# Patient Record
Sex: Male | Born: 1960 | Race: Black or African American | Hispanic: No | Marital: Married | Smoking: Former smoker
Health system: Southern US, Community
[De-identification: ages and names within clinical notes are randomized; demographics above are authoritative.]

## PROBLEM LIST (undated history)

## (undated) DIAGNOSIS — K219 Gastro-esophageal reflux disease without esophagitis: Secondary | ICD-10-CM

## (undated) DIAGNOSIS — I5022 Chronic systolic (congestive) heart failure: Secondary | ICD-10-CM

## (undated) DIAGNOSIS — E875 Hyperkalemia: Secondary | ICD-10-CM

## (undated) DIAGNOSIS — I2510 Atherosclerotic heart disease of native coronary artery without angina pectoris: Secondary | ICD-10-CM

## (undated) DIAGNOSIS — M25569 Pain in unspecified knee: Secondary | ICD-10-CM

## (undated) DIAGNOSIS — J449 Chronic obstructive pulmonary disease, unspecified: Secondary | ICD-10-CM

## (undated) DIAGNOSIS — N182 Chronic kidney disease, stage 2 (mild): Secondary | ICD-10-CM

## (undated) DIAGNOSIS — E781 Pure hyperglyceridemia: Secondary | ICD-10-CM

## (undated) DIAGNOSIS — N183 Chronic kidney disease, stage 3 unspecified: Secondary | ICD-10-CM

## (undated) DIAGNOSIS — Z8701 Personal history of pneumonia (recurrent): Secondary | ICD-10-CM

## (undated) DIAGNOSIS — Z9289 Personal history of other medical treatment: Secondary | ICD-10-CM

## (undated) DIAGNOSIS — I509 Heart failure, unspecified: Secondary | ICD-10-CM

## (undated) DIAGNOSIS — I428 Other cardiomyopathies: Secondary | ICD-10-CM

## (undated) DIAGNOSIS — E785 Hyperlipidemia, unspecified: Secondary | ICD-10-CM

## (undated) DIAGNOSIS — I10 Essential (primary) hypertension: Secondary | ICD-10-CM

## (undated) HISTORY — PX: OTHER SURGICAL HISTORY: SHX169

## (undated) HISTORY — DX: Chronic systolic (congestive) heart failure: I50.22

## (undated) HISTORY — DX: Heart failure, unspecified: I50.9

## (undated) HISTORY — DX: Chronic obstructive pulmonary disease, unspecified: J44.9

## (undated) HISTORY — DX: Pain in unspecified knee: M25.569

## (undated) HISTORY — DX: Chronic kidney disease, stage 3 unspecified: N18.30

## (undated) HISTORY — DX: Personal history of other medical treatment: Z92.89

## (undated) HISTORY — DX: Atherosclerotic heart disease of native coronary artery without angina pectoris: I25.10

## (undated) HISTORY — DX: Personal history of pneumonia (recurrent): Z87.01

## (undated) HISTORY — DX: Hyperlipidemia, unspecified: E78.5

## (undated) HISTORY — DX: Other cardiomyopathies: I42.8

## (undated) HISTORY — DX: Essential (primary) hypertension: I10

## (undated) HISTORY — DX: Gastro-esophageal reflux disease without esophagitis: K21.9

---

## 2000-08-25 ENCOUNTER — Encounter: Admission: RE | Admit: 2000-08-25 | Discharge: 2000-08-25 | Payer: Self-pay | Admitting: Occupational Medicine

## 2000-08-25 ENCOUNTER — Encounter: Payer: Self-pay | Admitting: Occupational Medicine

## 2000-12-05 ENCOUNTER — Encounter: Admission: RE | Admit: 2000-12-05 | Discharge: 2001-03-05 | Payer: Self-pay

## 2011-02-16 HISTORY — PX: CARDIAC CATHETERIZATION: SHX172

## 2011-03-08 ENCOUNTER — Encounter: Payer: Self-pay | Admitting: Cardiology

## 2011-03-14 ENCOUNTER — Encounter: Payer: Self-pay | Admitting: Cardiology

## 2011-03-18 ENCOUNTER — Encounter: Payer: Self-pay | Admitting: Cardiology

## 2011-04-05 ENCOUNTER — Encounter: Payer: Self-pay | Admitting: Cardiology

## 2011-04-23 ENCOUNTER — Encounter: Payer: Self-pay | Admitting: Cardiology

## 2011-04-23 ENCOUNTER — Ambulatory Visit (INDEPENDENT_AMBULATORY_CARE_PROVIDER_SITE_OTHER): Payer: BC Managed Care – PPO | Admitting: Cardiology

## 2011-04-23 DIAGNOSIS — R0602 Shortness of breath: Secondary | ICD-10-CM

## 2011-04-23 DIAGNOSIS — E785 Hyperlipidemia, unspecified: Secondary | ICD-10-CM

## 2011-04-23 DIAGNOSIS — I10 Essential (primary) hypertension: Secondary | ICD-10-CM | POA: Insufficient documentation

## 2011-04-23 DIAGNOSIS — I509 Heart failure, unspecified: Secondary | ICD-10-CM

## 2011-04-23 MED ORDER — SPIRONOLACTONE 25 MG PO TABS: 25.0000 mg | ORAL_TABLET | Freq: Every day | ORAL | Status: DC

## 2011-04-23 NOTE — Assessment & Plan Note (Signed)
We will determine targets based on the presence or absence of vascular disease going forward.

## 2011-04-23 NOTE — Patient Instructions (Addendum)
Please start Spironolactone 25 mg once a day. Continue all other medications as listed.  Please have lab work in 1 week. (BMP)  Your physician has requested that you have an echocardiogram. Echocardiography is a painless test that uses sound waves to create images of your heart. It provides your doctor with information about the size and shape of your heart and how well your heart's chambers and valves are working. This procedure takes approximately one hour. There are no restrictions for this procedure.  Follow up with Dr Percival Spanish in approximately 2 weeks.  Heart Failure Heart failure (HF) is a condition in which the heart has trouble pumping blood. This means your heart does not pump blood efficiently for your body to work well. In some cases of HF, fluid may back up into your lungs or you may have swelling (edema) in your lower legs. HF is a long-term (chronic) condition. It is important for you to take good care of yourself and follow your caregiver's treatment plan. CAUSES   Health conditions:   High blood pressure (hypertension) causes the heart muscle to work harder than normal. When pressure in the blood vessels is high, the heart needs to pump (contract) with more force in order to circulate blood throughout the body. High blood pressure eventually causes the heart to become stiff and weak.   Coronary artery disease (CAD) is the buildup of cholesterol and fat (plaques) in the arteries of the heart. The blockage in the arteries deprives the heart muscle of oxygen and blood. This can cause chest pain and may lead to a heart attack. High blood pressure can also contribute to CAD.   Heart attack (myocardial infarction) occurs when 1 or more arteries in the heart become blocked. The loss of oxygen damages the muscle tissue of the heart. When this happens, part of the heart muscle dies. The injured tissue does not contract as well and weakens the heart's ability to pump blood.   Abnormal  heart valves can cause HF when the heart valves do not open and close properly. This makes the heart muscle pump harder to keep the blood flowing.   Heart muscle disease (cardiomyopathy or myocarditis) is damage to the heart muscle from a variety of causes. These can include drug or alcohol abuse, infections, or unknown reasons. These can increase the risk of HF.   Lung disease makes the heart work harder because the lungs do not work properly. This can cause a strain on the heart leading it to fail.   Diabetes increases the risk of HF. High blood sugar contributes to high fat (lipid) levels in the blood. Diabetes can also cause slow damage to tiny blood vessels that carry important nutrients to the heart muscle. When the heart does not get enough oxygen and food, it can cause the heart to become weak and stiff. This leads to a heart that does not contract efficiently.   Other diseases can contribute to HF. These include abnormal heart rhythms, thyroid problems, and low blood counts (anemia).   Unhealthy lifestyle habits:   Obesity.   Smoking.   Eating foods high in fat and cholesterol.   Eating or drinking beverages high in salt.   Drug or alcohol abuse.   Lack of exercise.  SYMPTOMS  HF symptoms may vary and can be hard to detect. Symptoms may include:  Shortness of breath with activity, such as climbing stairs.   Persistent cough.   Swelling of the feet, ankles, legs, or abdomen.  Unexplained weight gain.   Difficulty breathing when lying flat.   Waking from sleep because of the need to sit up and get more air.   Rapid heartbeat.   Fatigue and loss of energy.   Feeling lightheaded or close to fainting.  DIAGNOSIS  A diagnosis of HF is based on your history, symptoms, physical examination, and diagnostic tests. Diagnostic tests for HF may include:  EKG.   Chest X-ray.   Blood tests.   Exercise stress test.   Blood oxygen test (arterial blood gas).    Evaluation by a heart doctor (cardiologist).   Ultrasound evaluation of the heart (echocardiogram).   Heart artery test to look for blockages (angiogram).   Radioactive imaging to look at the heart (radionuclide test).  TREATMENT  Treatment is aimed at managing the symptoms of HF. Medicines, lifestyle changes, or surgical intervention may be necessary to treat HF.  Medicines to help treat HF may include:   Angiotensin-converting enzyme (ACE) inhibitors. These block the effects of a blood protein called angiotensin-converting enzyme. ACE inhibitors relax (dilate) the blood vessels and help lower blood pressure. This decreases the workload of the heart, slows the progression of HF, and improves symptoms.   Angiotensin receptor blockers (ARBs). These medications work similar to ACE inhibitors. ARBs may be an alternative for people who cannot tolerate an ACE inhibitor.   Aldosterone antagonists. This medication helps get rid of extra fluid from your body. This lowers the volume of blood the heart has to pump.   Water pills (diuretics). Diuretics cause the kidneys to remove salt and water from the blood. The extra fluid is removed by urination. By removing extra fluid from the body, diuretics help lower the workload of the heart and help prevent fluid buildup in the lungs so breathing is easier.   Beta blockers. These prevent the heart from beating too fast and improve heart muscle strength. Beta blockers help maintain a normal heart rate, control blood pressure, and improve HF symptoms.   Digitalis. This increases the force of the heartbeat and may be helpful to people with HF or heart rhythm problems.   Healthy lifestyle changes include:   Stopping smoking.   Eating a healthy diet. Avoid foods high in fat. Avoid foods fried in oil or made with fat. A dietician can help with healthy food choices.   Limiting how much salt you eat.   Limiting alcohol intake to no more than 1 drink per  day for women and 2 drinks per day for men. Drinking more than that is harmful to your heart. If your heart has already been damaged by alcohol or you have severe HF, drinking alcohol should be stopped completely.   Exercising as directed by your caregiver.   Surgical treatment for HF may include:   Procedures to open blocked arteries, repair damaged heart valves, or remove damaged heart muscle tissue.   A pacemaker to help heart muscle function and to control certain abnormal heart rhythms.   A defibrillator to possibly prevent sudden cardiac death.  HOME CARE INSTRUCTIONS   Activity level. Your caregiver can help you determine what type of exercise program may be helpful. It is important to maintain your strength. Pace your physical activity to avoid shortness of breath or chest pain. Rest for 1 hour before and after meals. A cardiac rehabilitation program may be helpful to some people with HF.   Diet. Eat a heart healthy diet. Food choices should be low in saturated fat and cholesterol. Talk  to a dietician to learn about heart healthy foods.   Salt intake. When you have HF, you need to limit the amount of salt you eat. Eat less than 1500 milligrams (mg) of salt per day or as recommended by your caregiver.   Weight monitoring. Weigh yourself every day. You should weigh yourself in the morning after you urinate and before you eat breakfast. Wear the same amount of clothing each time you weigh yourself. Record your weight daily. Bring your recorded weights to your clinic visits. Tell your caregiver right away if you have gained 3 lb/1.4 kg in 1 day, or 5 lb/2.3 kg in a week or whatever amount you were told to report.   Blood pressure monitoring. This should be done as directed by your caregiver. A home blood pressure cuff can be purchased at a drugstore. Record your blood pressure numbers and bring them to your clinic visits. Tell your caregiver if you become dizzy or lightheaded upon standing  up.   Smoking. If you are currently a smoker, it is time to quit. Nicotine makes your heart work harder by causing your blood vessels to constrict. Do not use nicotine gum or patches before talking to your caregiver.   Follow up. Be sure to schedule a follow-up visit with your caregiver. Keep all your appointments.  SEEK MEDICAL CARE IF:   Your weight increases by 3 lb/1.4 kg in 1 day or 5 lb/2.3 kg in a week.   You notice increasing shortness of breath that is unusual for you. This may happen during rest, sleep, or with activity.   You cough more than normal, especially with physical activity.   You notice more swelling in your hands, feet, ankles, or belly (abdomen).   You are unable to sleep because it is hard to breathe.   You cough up bloody mucus (sputum).   You begin to feel "jumping" or "fluttering" sensations (palpitations) in your chest.  SEEK IMMEDIATE MEDICAL CARE IF:   You have severe chest pain or pressure which may include symptoms such as:   Pain or pressure in the arms, neck, jaw, or back.   Feeling sweaty.   Feeling sick to your stomach (nauseous).   Feeling short of breath while at rest.   Having a fast or irregular heartbeat.   You experience stroke symptoms. These symptoms include:   Facial weakness or numbness.   Weakness or numbness in an arm, leg, or on one side of your body.   Blurred vision.   Difficulty talking or thinking.   Dizziness or fainting.   Severe headache.  THESE ARE MEDICAL EMERGENCIES. Do not wait to see if the symptoms go away. Call your local emergency services (911 in [place] DO NOT drive yourself to the hospital. IMPORTANT  Make a list of every medicine, vitamin, or herbal supplement you are taking. Keep the list with you at all times. Show it to your caregiver at every visit. Keep the list up-to-date.   Ask your caregiver or pharmacist to write an explanation of each medicine you are taking. This should include:   Why  you are taking it.   The possible side effects.   The best time of day to take it.   Foods to take with it or what foods to avoid.   When to stop taking it.  MAKE SURE YOU:   Understand these instructions.   Will watch your condition.   Will get help right away if you are not doing well  or get worse.  Document Released: 02/01/2005 Document Revised: 01/21/2011 Document Reviewed: 05/16/2009 [facility] Patient Information 2012 [place].

## 2011-04-23 NOTE — Assessment & Plan Note (Signed)
I will add spironolactone. He will get a basic metabolic profile in one week. He will continue the other meds as listed.

## 2011-04-23 NOTE — Assessment & Plan Note (Signed)
The patient has evidence of heart failure. His exam suggests left ventricular hypertrophy. He at least has diastolic dysfunction. There may be systolic dysfunction I will follow this with an echocardiogram. I will also eventually do an ischemia workup given his risk factors but will decide on how to evaluate this based on the results of the echocardiogram. For now we discussed at length salt and fluid restriction which he will begin. Also manage his blood pressure as below.

## 2011-04-23 NOTE — Progress Notes (Signed)
HPI The patient is a pleasant gentleman without past cardiac history. For the last 2 months he's been having increasing dyspnea. This progressed to the point of having shortness of breath walking 5 yards on level ground. He's also been describing classic PND. He's not been having any weight gain or edema. He's had a mild cough productive of occasional white or yellow sputum. He's not been having any chest discomfort, neck or arm discomfort. He doesn't report palpitations, presyncope or syncope. He was seeing his primary provider and initially was treated for possible URI. However, quickly realized by chest x-ray that he had some suggestion of pulmonary edema. BNP levels have been elevated. He has been treated with diuretics and adjustments of medications for control of his blood pressure. He does think this is helping. He is less short of breath now though still not where he was this time last year.  Of note he has had high blood pressure not well controlled. More recently he has been diagnosed with diabetes.  No Known Allergies  Current Outpatient Prescriptions  Medication Sig Dispense Refill  . furosemide (LASIX) 20 MG tablet Take 20 mg by mouth 2 (two) times daily.      Marland Kitchen glipiZIDE (GLUCOTROL) 10 MG tablet Take 10 mg by mouth 2 (two) times daily before a meal.      . lisinopril (PRINIVIL,ZESTRIL) 40 MG tablet Take 40 mg by mouth daily.      . metFORMIN (GLUCOPHAGE) 1000 MG tablet Take 1,000 mg by mouth 2 (two) times daily with a meal.      . metoprolol succinate (TOPROL-XL) 50 MG 24 hr tablet Take 50 mg by mouth daily. Patient takes 1 and 1/2 tab daily, Take with or immediately following a meal.      . spironolactone (ALDACTONE) 25 MG tablet Take 1 tablet (25 mg total) by mouth daily.  30 tablet  11    Past Medical History  Diagnosis Date  . Diabetes mellitus   . COPD (chronic obstructive pulmonary disease)   . Hypertension   . Hyperlipidemia   . GERD (gastroesophageal reflux disease)     . Knee pain   . SOB (shortness of breath)   . Atelectasis     mild bibasilar  . Pneumonia     Past Surgical History  Procedure Date  . None     Family History  Problem Relation Age of Onset  . Diabetes Sister     History   Social History  . Marital Status: Married    Spouse Name: N/A    Number of Children: 6  . Years of Education: N/A   Occupational History  . [employer]    Social History Main Topics  . Smoking status: Former Smoker    Quit date: 04/23/2006  . Smokeless tobacco: Not on file  . Alcohol Use: Not on file  . Drug Use: Not on file  . Sexually Active: Not on file   Other Topics Concern  . Not on file   Social History Narrative   Lives with wife.    ROS:  Positive for dentures, reflux, knee pain. Otherwise as stated in the history of present illness and negative for other systems.  PHYSICAL EXAM BP 159/91  Pulse 65  Ht 5\' 11"  (1.803 m)  Wt 206 lb (93.441 kg)  BMI 28.73 kg/m2 GENERAL:  Well appearing HEENT:  Pupils equal round and reactive, fundi not visualized, oral mucosa unremarkable NECK:  No jugular venous distention, waveform within normal  limits, carotid upstroke brisk and symmetric, no bruits, no thyromegaly LYMPHATICS:  No cervical, inguinal adenopathy LUNGS:  Clear to auscultation bilaterally BACK:  No CVA tenderness CHEST:  Unremarkable HEART:  PMI displaced laterally, S1 and S2 within normal limits, positive S3 vs fixed split S2, no S4, no clicks, no rubs, no murmurs ABD:  Flat, positive bowel sounds normal in frequency in pitch, no bruits, no rebound, no guarding, no midline pulsatile mass, no hepatomegaly, no splenomegaly EXT:  2 plus pulses throughout, no edema, no cyanosis no clubbing SKIN:  No rashes no nodules NEURO:  Cranial nerves II through XII grossly intact, motor grossly intact throughout PSYCH:  Cognitively intact, oriented to person place and time  EKG:  Sinus rhythm, rate 65, left ventricle hypertrophy by voltage  criteria, QTC prolonged, nonspecific inferolateral T wave flattening. 04/23/2011   ASSESSMENT AND PLAN

## 2011-04-27 DIAGNOSIS — I509 Heart failure, unspecified: Secondary | ICD-10-CM | POA: Insufficient documentation

## 2011-04-28 ENCOUNTER — Other Ambulatory Visit (INDEPENDENT_AMBULATORY_CARE_PROVIDER_SITE_OTHER): Payer: BC Managed Care – PPO

## 2011-04-28 DIAGNOSIS — R0602 Shortness of breath: Secondary | ICD-10-CM

## 2011-04-28 DIAGNOSIS — I509 Heart failure, unspecified: Secondary | ICD-10-CM

## 2011-04-28 LAB — BASIC METABOLIC PANEL
CO2: 28 mEq/L (ref 19–32)
Calcium: 9 mg/dL (ref 8.4–10.5)
GFR: 57.37 mL/min — ABNORMAL LOW (ref >60.00)
Potassium: 3.5 mEq/L (ref 3.5–5.1)
Sodium: 140 mEq/L (ref 135–145)

## 2011-04-29 ENCOUNTER — Encounter (HOSPITAL_COMMUNITY): Payer: Self-pay | Admitting: *Deleted

## 2011-04-29 ENCOUNTER — Other Ambulatory Visit: Payer: Self-pay

## 2011-04-29 ENCOUNTER — Emergency Department (HOSPITAL_COMMUNITY): Payer: BC Managed Care – PPO

## 2011-04-29 ENCOUNTER — Emergency Department (HOSPITAL_COMMUNITY)
Admission: EM | Admit: 2011-04-29 | Discharge: 2011-04-30 | Disposition: A | Discharge status: Home / Self Care | Payer: BC Managed Care – PPO | Attending: Emergency Medicine | Admitting: Emergency Medicine

## 2011-04-29 DIAGNOSIS — J449 Chronic obstructive pulmonary disease, unspecified: Secondary | ICD-10-CM | POA: Insufficient documentation

## 2011-04-29 DIAGNOSIS — E119 Type 2 diabetes mellitus without complications: Secondary | ICD-10-CM | POA: Insufficient documentation

## 2011-04-29 DIAGNOSIS — G4730 Sleep apnea, unspecified: Secondary | ICD-10-CM | POA: Insufficient documentation

## 2011-04-29 DIAGNOSIS — R0602 Shortness of breath: Secondary | ICD-10-CM | POA: Insufficient documentation

## 2011-04-29 DIAGNOSIS — E785 Hyperlipidemia, unspecified: Secondary | ICD-10-CM | POA: Insufficient documentation

## 2011-04-29 DIAGNOSIS — I10 Essential (primary) hypertension: Secondary | ICD-10-CM | POA: Insufficient documentation

## 2011-04-29 DIAGNOSIS — J4489 Other specified chronic obstructive pulmonary disease: Secondary | ICD-10-CM | POA: Insufficient documentation

## 2011-04-29 DIAGNOSIS — Z8701 Personal history of pneumonia (recurrent): Secondary | ICD-10-CM | POA: Insufficient documentation

## 2011-04-29 NOTE — ED Notes (Signed)
Pt states he gets short of breath with exertion or when he goes to sleep. Pt sleeps propped up with pillows. Pt states he has been short of breath for several months, but is now getting worse. Pt sent from Urgent Care for possible diagnosis of CHF. Pt with no acute distress in triage.

## 2011-04-29 NOTE — ED Provider Notes (Signed)
History     CSN: [id number]  Arrival date & time 04/29/11  2158   First MD Initiated Contact with Patient 04/29/11 2320      Chief Complaint  Patient presents with  . Shortness of Breath     HPI  History provided by the patient and spouse. Patient is a 51 year old American male with history of hypertension, diabetes, hyperlipidemia and COPD who presents with complaints of worsening shortness of breath. Patient reports having increasing shortness of breath with exertion and with lying flat on trying to sleep for the past several months. He reports currently being worked up by primary care provider and more recently seen [facility] for possible congestive heart failure symptoms. Today patient complains that he's been unable to sleep in any position even sitting upright. Patient reports frequent awakenings with shortness of breath symptoms. He denies any significant swelling in extremities. he has been taking his medications as prescribed. Patient denies any significant cough, fever, chills, sweats, chest pain, heart palpitations. Symptoms are described as moderate to severe. he denies any other aggravating or alleviating factors.    Past Medical History  Diagnosis Date  . Diabetes mellitus   . COPD (chronic obstructive pulmonary disease)   . Hypertension   . Hyperlipidemia   . GERD (gastroesophageal reflux disease)   . Knee pain   . SOB (shortness of breath)   . Atelectasis     mild bibasilar  . Pneumonia     Past Surgical History  Procedure Date  . None     Family History  Problem Relation Age of Onset  . Diabetes Sister     History  Substance Use Topics  . Smoking status: Former Smoker    Quit date: 04/23/2006  . Smokeless tobacco: Not on file  . Alcohol Use: Yes     occ      Review of Systems  Constitutional: Negative for diaphoresis.  Respiratory: Positive for shortness of breath. Negative for cough.   Cardiovascular: Negative for chest pain,  palpitations and leg swelling.  Gastrointestinal: Negative for nausea and abdominal pain.  All other systems reviewed and are negative.    Allergies  Review of patient's allergies indicates no known allergies.  Home Medications   Current Outpatient Rx  Name Route Sig Dispense Refill  . ALBUTEROL SULFATE HFA 108 (90 BASE) MCG/ACT IN AERS Inhalation Inhale 2 puffs into the lungs every 6 (six) hours as needed. For shortness of breath.    . AZITHROMYCIN 250 MG PO TABS Oral Take 250-500 mg by mouth daily. 2 tab on Day 1, 1 tab on Days 2-5    . FUROSEMIDE 20 MG PO TABS Oral Take 20 mg by mouth daily.     Marland Kitchen GLIPIZIDE 10 MG PO TABS Oral Take 10 mg by mouth 2 (two) times daily before a meal.    . LISINOPRIL 40 MG PO TABS Oral Take 40 mg by mouth daily.    Marland Kitchen METFORMIN HCL 1000 MG PO TABS Oral Take 1,000 mg by mouth 2 (two) times daily with a meal.    . METOPROLOL SUCCINATE ER 50 MG PO TB24 Oral Take 75 mg by mouth daily.     Marland Kitchen SPIRONOLACTONE 25 MG PO TABS Oral Take 1 tablet (25 mg total) by mouth daily. 30 tablet 11    BP 181/108  Pulse 72  Temp(Src) 98.3 F (36.8 C) (Oral)  Resp 17  SpO2 99%  Physical Exam  Nursing note and vitals reviewed. Constitutional: He is  oriented to person, place, and time. He appears well-developed and well-nourished. No distress.  HENT:  Head: Normocephalic.  Neck: JVD present.  Cardiovascular: Normal rate and regular rhythm.   Pulmonary/Chest: Effort normal and breath sounds normal. No respiratory distress. He has no wheezes. He has no rales.  Abdominal: Soft. He exhibits no distension. There is no tenderness. There is no rebound.  Musculoskeletal: He exhibits no edema and no tenderness.  Neurological: He is alert and oriented to person, place, and time.  Skin: Skin is warm. No rash noted.  Psychiatric: He has a normal mood and affect. His behavior is normal.    ED Course  Procedures   Results for orders placed during the hospital encounter of  04/29/11  PRO B NATRIURETIC PEPTIDE      Component Value Range   Pro B Natriuretic peptide (BNP) 9775.0 (*) 0 - 125 (pg/mL)  CBC      Component Value Range   WBC 7.6  4.0 - 10.5 (K/uL)   RBC 4.31  4.22 - 5.81 (MIL/uL)   Hemoglobin 13.2  13.0 - 17.0 (g/dL)   HCT 37.9 (*) 39.0 - 52.0 (%)   MCV 87.9  78.0 - 100.0 (fL)   MCH 30.6  26.0 - 34.0 (pg)   MCHC 34.8  30.0 - 36.0 (g/dL)   RDW 13.1  11.5 - 15.5 (%)   Platelets 310  150 - 400 (K/uL)  DIFFERENTIAL      Component Value Range   Neutrophils Relative 62  43 - 77 (%)   Lymphocytes Relative 26  12 - 46 (%)   Monocytes Relative 12  3 - 12 (%)   Eosinophils Relative 0  0 - 5 (%)   Basophils Relative 0  0 - 1 (%)   Neutro Abs 4.7  1.7 - 7.7 (K/uL)   Lymphs Abs 2.0  0.7 - 4.0 (K/uL)   Monocytes Absolute 0.9  0.1 - 1.0 (K/uL)   Eosinophils Absolute 0.0  0.0 - 0.7 (K/uL)   Basophils Absolute 0.0  0.0 - 0.1 (K/uL)   Smear Review MORPHOLOGY UNREMARKABLE    BASIC METABOLIC PANEL      Component Value Range   Sodium 136  135 - 145 (mEq/L)   Potassium 3.2 (*) 3.5 - 5.1 (mEq/L)   Chloride 97  96 - 112 (mEq/L)   CO2 29  19 - 32 (mEq/L)   Glucose, Bld 90  70 - 99 (mg/dL)   BUN 25 (*) 6 - 23 (mg/dL)   Creatinine, Ser 1.36 (*) 0.50 - 1.35 (mg/dL)   Calcium 8.7  8.4 - 10.5 (mg/dL)   GFR calc non Af Amer 58 (*) >90 (mL/min)   GFR calc Af Amer 68 (*) >90 (mL/min)  TROPONIN I      Component Value Range   Troponin I <0.30  <0.30 (ng/mL)     Dg Chest 2 View  04/30/2011  *RADIOLOGY REPORT*  Clinical Data: Shortness of breath  CHEST - 2 VIEW  Comparison: None.  Findings: Small right pleural effusion with associated opacity. There may be trace left pleural and opacity as well.  Heart size upper normal limits to mildly enlarged.  Mild central vascular congestion. No overt edema.  No acute osseous abnormality.  IMPRESSION: Small right and trace left pleural effusions with associated opacities; atelectasis versus infiltrate.  Heart size upper normal  limits to mildly enlarged with central vascular congestion.  No overt edema.  Original Report Authenticated By: Suanne Marker, M.D.  1. Sleep apnea   2. Shortness of breath       MDM  11:20 PM patient seen and evaluated. Patient in no acute distress.  Pt was seen and discussed with attending physician.  Pt's symptoms seem more consistent with sleep apnea.  Pt has good follow up planned with cardiologist with next appointment on Monday.  CXR with no concerning sings for CHF.  Pt given referral for sleep study.     Date: 04/30/2011  Rate: 73  Rhythm: normal sinus rhythm  QRS Axis: normal  Intervals: QT prolonged  ST/T Wave abnormalities: nonspecific T wave changes  Conduction Disutrbances:none  Narrative Interpretation:   Old EKG Reviewed: none available       Martie Lee, [place] 04/30/11 1923

## 2011-04-30 LAB — CBC
Hemoglobin: 13.2 g/dL (ref 13.0–17.0)
MCH: 30.6 pg (ref 26.0–34.0)
MCV: 87.9 fL (ref 78.0–100.0)
Platelets: 310 10*3/uL (ref 150–400)
RBC: 4.31 MIL/uL (ref 4.22–5.81)

## 2011-04-30 LAB — DIFFERENTIAL
Basophils Relative: 0 % (ref 0–1)
Eosinophils Relative: 0 % (ref 0–5)
Lymphs Abs: 2 10*3/uL (ref 0.7–4.0)
Monocytes Absolute: 0.9 10*3/uL (ref 0.1–1.0)

## 2011-04-30 LAB — BASIC METABOLIC PANEL
BUN: 25 mg/dL — ABNORMAL HIGH (ref 6–23)
CO2: 29 mEq/L (ref 19–32)
Calcium: 8.7 mg/dL (ref 8.4–10.5)
Creatinine, Ser: 1.36 mg/dL — ABNORMAL HIGH (ref 0.50–1.35)
Glucose, Bld: 90 mg/dL (ref 70–99)

## 2011-04-30 MED ORDER — FUROSEMIDE 10 MG/ML IJ SOLN
40.0000 mg | Freq: Once | INTRAMUSCULAR | Status: AC
  Administered 2011-04-30: 40 mg via INTRAVENOUS
  Filled 2011-04-30: qty 4

## 2011-04-30 MED ORDER — OXYMETAZOLINE HCL 0.05 % NA SOLN
1.0000 | Freq: Once | NASAL | Status: AC
  Administered 2011-04-30: 1 via NASAL
  Filled 2011-04-30: qty 15

## 2011-04-30 NOTE — ED Provider Notes (Signed)
5:32 AM Patient's lungs are clear. After discussion with him and his significant other his symptoms are very suspicious for sleep apnea. She states that he snores severely and stops breathing in his sleep. Will make arrangements for him to have a sleep study as an outpatient. The importance of treatment of sleep apnea was stressed.  Wynetta Fines, MD 04/30/11 [phone]

## 2011-04-30 NOTE — Discharge Instructions (Signed)
You were seen and evaluated today for your complaints of shortness of breath and difficulty sleeping. At this time your lab tests have not shown any signs for concerning or emergent cause your symptoms. Your providers today have made arrangements for you to have a sleep study for further evaluation and to check for sleep apnea. Please followup with the sleep Center. Please also contact your primary care provider and specialists as planned for continued evaluation and treatment of your symptoms. Return to the emergency room if you have worsening shortness of breath, chest pain or chest pressure.   Sleep Apnea Sleep apnea is a common disorder. The main problem of this disorder is excessive daytime sleepiness and compromised quality of life. This may include social and emotional problems. There are two types of sleep apnea.  Obstructive sleep apnea is when breathing stops due to a blocked airway.   Central sleep apnea is a malfunction of the brain's normal signal to breathe.  SYMPTOMS  Restless sleep.   Falling asleep while driving and/or during the day.   Loss of energy.   Irritability.   Mood or behavior changes.   Loud, heavy snoring.   Morning headaches.   Trouble concentrating.   Forgetfulness.   Anxiety or depression.   Decreased interest in sex.  Not all people with sleep apnea have all of these symptoms. However, people who have a few of these symptoms should visit their caregiver for an evaluation. Problems related to untreated sleep apnea include:  High blood pressure (hypertension).   Coronary artery disease.   Impotence.   Cognitive dysfunction.   Memory loss.  TREATMENT  For mild cases, treatment may include avoiding sleeping on one's back.   For people with nasal congestion, a decongestant may be prescribed.   Patients with obstructive and central apnea should avoid depressants. This includes alcohol, sedatives and narcotics. Weight loss and diet control  are encouraged for overweight patients.   Many serious cases of obstructive sleep apnea can be relieved by a treatment called nasal continuous positive airway pressure (nasal CPAP). Nasal CPAP uses a mask-like device and pump that work together to keep the airway open. The pump delivers air pressure during each breath.   Surgery may help some patients by stopping or reducing the narrowing of the airway due to anatomical defects.  PROGNOSIS  Removing the obstruction usually reverses hypertension and cardiac problems. Untreated, sleep apnea sufferers have a tendency to fall asleep during the day. This is can result in serious accident or loss of ones job. RESEARCH Sleep apnea is currently one of the most active areas of sleep research.  Document Released: 01/22/2002 Document Revised: 01/21/2011 Document Reviewed: 05/20/2005 [facility] Patient Information 2012 [place].    Shortness of Breath Shortness of breath (dyspnea) is the feeling of uneasy breathing. Shortness of breath needs care right away. HOME CARE   Do not smoke.   Avoid being around chemicals that may bother your breathing (such as paint fumes or dust).   Rest as needed. Slowly begin your usual activities.   Only take medicine as told by your doctor. Inhaled medicines or oxygen might be part of your treatment.   Follow up with your doctor as told. Waiting to do so or failure to follow up could result in worsening your condition and possible disability or death.   Understand what to do or who to call if your shortness of breath gets worse.  GET HELP RIGHT AWAY IF:   You get pain in  your chest, shoulders, belly (abdomen), or jaw.   You cannot stop coughing or you start wheezing.   You cough up blood or thick mucus.   You can only speak with short words.   You have a fever.   You feel your heart racing or skipping beats.   You are not breathing better when you stop and rest.   Your condition does not improve  in the time expected.   You have problems with medicines.  MAKE SURE YOU:  Understand these instructions.   Will watch your condition.   Will get help right away if you are not doing well or get worse.  Document Released: 07/21/2007 Document Revised: 01/21/2011 Document Reviewed: 12/18/2008 [facility] Patient Information 2012 [place].    RESOURCE GUIDE  Dental Problems  Patients with Medicaid: [place] Phone:  [phone]                                                  Phone:  [phone]  If unable to pay or uninsured, contact:  [facility] or [facility]. to become qualified for the adult dental clinic.  Chronic Pain Problems Contact [facility]  [phone] Patients need to be referred by their primary care doctor.  Insufficient Money for Medicine Contact [employer]:  call "211" or [place] [phone].  No Primary Care Doctor Call [facility]  [phone] Other agencies that provide inexpensive medical care    [place]  [phone]    [facility]  [place]  [phone]    [facility]  [phone]    [facility]  [place]  [place]  [phone] [place]   [phone] (emergency services [phone])  Sub[facility]  [phone] [facility] [phone] [place] [phone] [place] [phone] Residential & Outpatient Substance Abuse Program  [phone]  Abuse/Neglect [place] [phone] [place] [phone] (After Hours)  Emergency [place]  [phone]  [place] [facility] [phone] [place] [phone]  MRSA Hotline #:   (574)194-4594    [facility] Dept. [address]      [place]  [place] Phone:  [phone]                                   Phone:  [phone]                 Phone:  [place] Phone:  [place] [phone] (After Hours)

## 2011-05-04 ENCOUNTER — Ambulatory Visit (HOSPITAL_COMMUNITY): Payer: BC Managed Care – PPO | Attending: Cardiology

## 2011-05-04 ENCOUNTER — Other Ambulatory Visit: Payer: Self-pay

## 2011-05-04 DIAGNOSIS — I10 Essential (primary) hypertension: Secondary | ICD-10-CM | POA: Insufficient documentation

## 2011-05-04 DIAGNOSIS — R0602 Shortness of breath: Secondary | ICD-10-CM

## 2011-05-04 DIAGNOSIS — G4733 Obstructive sleep apnea (adult) (pediatric): Secondary | ICD-10-CM | POA: Insufficient documentation

## 2011-05-04 DIAGNOSIS — E785 Hyperlipidemia, unspecified: Secondary | ICD-10-CM | POA: Insufficient documentation

## 2011-05-04 DIAGNOSIS — R0609 Other forms of dyspnea: Secondary | ICD-10-CM | POA: Insufficient documentation

## 2011-05-04 DIAGNOSIS — I509 Heart failure, unspecified: Secondary | ICD-10-CM

## 2011-05-04 DIAGNOSIS — R0989 Other specified symptoms and signs involving the circulatory and respiratory systems: Secondary | ICD-10-CM | POA: Insufficient documentation

## 2011-05-04 DIAGNOSIS — E119 Type 2 diabetes mellitus without complications: Secondary | ICD-10-CM | POA: Insufficient documentation

## 2011-05-10 ENCOUNTER — Telehealth: Payer: Self-pay | Admitting: Cardiology

## 2011-05-10 NOTE — Telephone Encounter (Signed)
Error

## 2011-05-11 ENCOUNTER — Ambulatory Visit (INDEPENDENT_AMBULATORY_CARE_PROVIDER_SITE_OTHER): Payer: BC Managed Care – PPO | Admitting: Cardiology

## 2011-05-11 ENCOUNTER — Telehealth: Payer: Self-pay | Admitting: Cardiology

## 2011-05-11 ENCOUNTER — Encounter: Payer: Self-pay | Admitting: *Deleted

## 2011-05-11 ENCOUNTER — Encounter: Payer: Self-pay | Admitting: Cardiology

## 2011-05-11 VITALS — BP 180/110 | HR 60 | Ht 71.0 in | Wt 201.0 lb

## 2011-05-11 DIAGNOSIS — I5022 Chronic systolic (congestive) heart failure: Secondary | ICD-10-CM | POA: Insufficient documentation

## 2011-05-11 DIAGNOSIS — E785 Hyperlipidemia, unspecified: Secondary | ICD-10-CM

## 2011-05-11 DIAGNOSIS — I10 Essential (primary) hypertension: Secondary | ICD-10-CM

## 2011-05-11 DIAGNOSIS — Z01810 Encounter for preprocedural cardiovascular examination: Secondary | ICD-10-CM

## 2011-05-11 DIAGNOSIS — I509 Heart failure, unspecified: Secondary | ICD-10-CM

## 2011-05-11 LAB — BASIC METABOLIC PANEL
CO2: 26 mEq/L (ref 19–32)
Calcium: 9.3 mg/dL (ref 8.4–10.5)
Creatinine, Ser: 1.1 mg/dL (ref 0.4–1.5)
Glucose, Bld: 203 mg/dL — ABNORMAL HIGH (ref 70–99)

## 2011-05-11 MED ORDER — METOPROLOL SUCCINATE ER 100 MG PO TB24: 100.0000 mg | ORAL_TABLET | Freq: Every day | ORAL | Status: DC

## 2011-05-11 MED ORDER — SPIRONOLACTONE 50 MG PO TABS: 50.0000 mg | ORAL_TABLET | Freq: Every day | ORAL | Status: DC

## 2011-05-11 NOTE — Assessment & Plan Note (Signed)
The patient has a newly diagnosed cardiomyopathy with an unclear etiology. It be managed medically with increasing his beta blocker dose today. I will also increase his spironolactone dose. I will check a basic metabolic profile today. I do note that his creatinine has been borderline high. In the emergency room his potassium was slightly low. He will need a right and left heart catheterization to evaluate etiology and understand pressures. I have discussed this with him at length including risks benefits. He understands and agrees to proceed.

## 2011-05-11 NOTE — Assessment & Plan Note (Addendum)
I will draw a lipid profile with an LDL goal less than 100.

## 2011-05-11 NOTE — Progress Notes (Signed)
   HPI The patient is a pleasant gentleman without past cardiac history. I saw him for the first time recently to evaluate dyspnea.  He had a recent CXR with pulmonary edema and an elevated proBNP. At the last appt I sent him for an echocardiogram which demonstrated an EF of 30%  He returns for follow up of this.  Since I last saw him he was in the ER for dyspnea but was thought to have sleep apnea.  He was treated with IV lasix and he has felt better since then.  He has been set up for a sleep study as well.  He reports that his breathing is better than it had been since late last year. He dyspneic with exertion but is not describing currently PND or orthopnea. He's not describing cough. The chest discomfort, neck or arm discomfort. He's had no palpitations presyncope or syncope.  No Known Allergies  Current Outpatient Prescriptions  Medication Sig Dispense Refill  . albuterol (PROVENTIL HFA;VENTOLIN HFA) 108 (90 BASE) MCG/ACT inhaler Inhale 2 puffs into the lungs every 6 (six) hours as needed. For shortness of breath.      . furosemide (LASIX) 20 MG tablet Take 20 mg by mouth daily.       Marland Kitchen glipiZIDE (GLUCOTROL) 10 MG tablet Take 10 mg by mouth 2 (two) times daily before a meal.      . lisinopril (PRINIVIL,ZESTRIL) 40 MG tablet Take 40 mg by mouth daily.      . metFORMIN (GLUCOPHAGE) 1000 MG tablet Take 1,000 mg by mouth 2 (two) times daily with a meal.      . metoprolol succinate (TOPROL-XL) 50 MG 24 hr tablet Take 75 mg by mouth daily.       Marland Kitchen spironolactone (ALDACTONE) 25 MG tablet Take 1 tablet (25 mg total) by mouth daily.  30 tablet  11    Past Medical History  Diagnosis Date  . Diabetes mellitus   . COPD (chronic obstructive pulmonary disease)   . Hypertension   . Hyperlipidemia   . GERD (gastroesophageal reflux disease)   . Knee pain   . SOB (shortness of breath)   . Atelectasis     mild bibasilar  . Pneumonia     Past Surgical History  Procedure Date  . None     ROS:   As stated in the history of present illness and negative for other systems.  PHYSICAL EXAM BP 180/110  Pulse 60  Ht 5\' 11"  (1.803 m)  Wt 201 lb (91.173 kg)  BMI 28.03 kg/m2 GENERAL:  Well appearing HEENT:  Pupils equal round and reactive, fundi not visualized, oral mucosa unremarkable, partial NECK:  No jugular venous distention, waveform within normal limits, carotid upstroke brisk and symmetric, no bruits, no thyromegaly LYMPHATICS:  No cervical, inguinal adenopathy LUNGS:  Clear to auscultation bilaterally BACK:  No CVA tenderness CHEST:  Unremarkable HEART:  PMI displaced laterally, S1 and S2 within normal limits, positive S3 vs fixed split S2, positiveS4, no clicks, no rubs, no murmurs ABD:  Flat, positive bowel sounds normal in frequency in pitch, no bruits, no rebound, no guarding, no midline pulsatile mass, no hepatomegaly, no splenomegaly EXT:  2 plus pulses throughout, no edema, no cyanosis no clubbing SKIN:  No rashes no nodules NEURO:  Cranial nerves II through XII grossly intact, motor grossly intact throughout PSYCH:  Cognitively intact, oriented to person place and time   ASSESSMENT AND PLAN

## 2011-05-11 NOTE — Patient Instructions (Addendum)
Please increase your Spironolactone to 50 mg a day and your Toprol to 100 mg a day Please call continue all other medications as listed.  Please cal blood work today (BMP) and 05/25/2011 (BMP, INR, CBC) for precath labs.  Your physician has requested that you have a cardiac catheterization on 05/28/11. Cardiac catheterization is used to diagnose and/or treat various heart conditions. Doctors may recommend this procedure for a number of different reasons. The most common reason is to evaluate chest pain. Chest pain can be a symptom of coronary artery disease (CAD), and cardiac catheterization can show whether plaque is narrowing or blocking your heart's arteries. This procedure is also used to evaluate the valves, as well as measure the blood flow and oxygen levels in different parts of your heart. For further information please visit [email or web address]. Please follow instruction sheet, as given.

## 2011-05-11 NOTE — Assessment & Plan Note (Signed)
This is being managed in the context of treating his CHF

## 2011-05-11 NOTE — Telephone Encounter (Signed)
FU Call: Pt returning call from our office. Please return pt call to discuss further.

## 2011-05-12 MED ORDER — POTASSIUM CHLORIDE CRYS ER 20 MEQ PO TBCR: EXTENDED_RELEASE_TABLET | ORAL | Status: DC

## 2011-05-20 ENCOUNTER — Encounter: Payer: Self-pay | Admitting: Cardiology

## 2011-05-23 ENCOUNTER — Other Ambulatory Visit: Payer: Self-pay | Admitting: Cardiology

## 2011-05-23 DIAGNOSIS — I509 Heart failure, unspecified: Secondary | ICD-10-CM

## 2011-05-25 ENCOUNTER — Encounter: Payer: Self-pay | Admitting: Pulmonary Disease

## 2011-05-25 ENCOUNTER — Other Ambulatory Visit (INDEPENDENT_AMBULATORY_CARE_PROVIDER_SITE_OTHER): Payer: BC Managed Care – PPO

## 2011-05-25 ENCOUNTER — Other Ambulatory Visit: Payer: BC Managed Care – PPO

## 2011-05-25 ENCOUNTER — Ambulatory Visit (INDEPENDENT_AMBULATORY_CARE_PROVIDER_SITE_OTHER): Payer: BC Managed Care – PPO | Admitting: Pulmonary Disease

## 2011-05-25 VITALS — BP 138/86 | HR 69 | Temp 98.2°F | Ht 71.0 in | Wt 203.6 lb

## 2011-05-25 DIAGNOSIS — G4733 Obstructive sleep apnea (adult) (pediatric): Secondary | ICD-10-CM | POA: Insufficient documentation

## 2011-05-25 DIAGNOSIS — I10 Essential (primary) hypertension: Secondary | ICD-10-CM

## 2011-05-25 DIAGNOSIS — Z01810 Encounter for preprocedural cardiovascular examination: Secondary | ICD-10-CM

## 2011-05-25 DIAGNOSIS — G4730 Sleep apnea, unspecified: Secondary | ICD-10-CM

## 2011-05-25 DIAGNOSIS — I509 Heart failure, unspecified: Secondary | ICD-10-CM

## 2011-05-25 LAB — CBC WITH DIFFERENTIAL/PLATELET
Basophils Absolute: 0 10*3/uL (ref 0.0–0.1)
Eosinophils Relative: 2 % (ref 0.0–5.0)
MCV: 91.7 fl (ref 78.0–100.0)
Monocytes Absolute: 0.7 10*3/uL (ref 0.1–1.0)
Neutrophils Relative %: 61.3 % (ref 43.0–77.0)
Platelets: 283 10*3/uL (ref 150.0–400.0)
RDW: 13.8 % (ref 11.5–14.6)
WBC: 9.1 10*3/uL (ref 4.5–10.5)

## 2011-05-25 LAB — BASIC METABOLIC PANEL
BUN: 16 mg/dL (ref 6–23)
Chloride: 104 mEq/L (ref 96–112)
Creatinine, Ser: 1.3 mg/dL (ref 0.4–1.5)
GFR: 59.34 mL/min — ABNORMAL LOW (ref >60.00)

## 2011-05-25 LAB — PROTIME-INR
INR: 0.9 ratio (ref 0.8–1.0)
Prothrombin Time: 9.9 s — ABNORMAL LOW (ref 10.2–12.4)

## 2011-05-25 NOTE — Assessment & Plan Note (Signed)
The patient's history is very suggestive of clinically significant sleep apnea.  Although he does not have the body habitus typically seen with obstructive sleep apnea, his wife has noted loud snoring.  With his cardiomyopathy, he may have more of an issue with central sleep apnea and Cheyne-Stokes respirations than obstructive sleep apnea.  With his underlying heart issues, as well as his significant nighttime and daytime quality of life issues, he clearly needs to have a sleep study for evaluation.  The patient is agreeable.

## 2011-05-25 NOTE — Patient Instructions (Signed)
Will set up for a sleep study, and arrange followup once results return.

## 2011-05-25 NOTE — Progress Notes (Signed)
  Subjective:    Patient ID: Roberto Allen, male    DOB: November 18, 1960, 51 y.o.   MRN: [id number]  HPI The pt comes in today for evaluation of possible sleep apnea.  He has a known cardiomyopathy, and recently had an acute exacerbation of congestive heart failure.  He has been treated aggressively, and is much improved.  The question has been raised whether he may have sleep apnea because of his significant symptoms, and this can contribute to his known cardiomyopathy.  The patient states that his wife complains of loud snoring, and she has seen an abnormal breathing pattern during sleep.  This was much worse when he was having his episode of congestive heart failure.  The patient states he is never rested upon arising, and notes significant daytime sleepiness with any period of inactivity.  His Epworth score today is very abnormal at 16.  Patient states that he will fall asleep while watching television or movies, and also has some sleepiness with driving that forces him to open the windows in order to stay at his highest level of alertness.  Sleep Questionnaire: What time do you typically go to bed?( Between what hours) 9 to 11 pm How long does it take you to fall asleep? 30 mins How many times during the night do you wake up? 2 What time do you get out of bed to start your day? [id number] Do you drive or operate heavy machinery in your occupation? Yes How much has your weight changed (up or down) over the past two years? (In pounds) 0 oz (0 kg) Have you ever had a sleep study before? No Do you currently use CPAP? No Do you wear oxygen at any time? No    Review of Systems  Constitutional: Negative for fever and unexpected weight change.  HENT: Negative for ear pain, nosebleeds, congestion, sore throat, rhinorrhea, sneezing, trouble swallowing, dental problem, postnasal drip and sinus pressure.   Eyes: Negative for redness and itching.  Respiratory: Positive for shortness of breath. Negative for cough, chest  tightness and wheezing.   Cardiovascular: Negative for palpitations and leg swelling.  Gastrointestinal: Negative for nausea and vomiting.  Genitourinary: Negative for dysuria.  Musculoskeletal: Negative for joint swelling.  Skin: Negative for rash.  Neurological: Negative for headaches.  Hematological: Does not bruise/bleed easily.  Psychiatric/Behavioral: Negative for dysphoric mood. The patient is not nervous/anxious.        Objective:   Physical Exam Constitutional:  Well developed, no acute distress  HENT:  Nares patent without discharge  Oropharynx without exudate, palate and uvula are normal  Eyes:  Perrla, eomi, no scleral icterus  Neck:  No JVD, no TMG  Cardiovascular:  Normal rate, regular rhythm, no rubs or gallops.  No murmurs        Intact distal pulses  Pulmonary :  Normal breath sounds, no stridor or respiratory distress   No rales, rhonchi, or wheezing  Abdominal:  Soft, nondistended, bowel sounds present.  No tenderness noted.   Musculoskeletal:  No lower extremity edema noted.  Lymph Nodes:  No cervical lymphadenopathy noted  Skin:  No cyanosis noted  Neurologic:  Appears sleepy, appropriate, moves all 4 extremities without obvious deficit.         Assessment & Plan:

## 2011-05-28 ENCOUNTER — Inpatient Hospital Stay (HOSPITAL_BASED_OUTPATIENT_CLINIC_OR_DEPARTMENT_OTHER)
Admission: RE | Admit: 2011-05-28 | Discharge: 2011-05-28 | Disposition: A | Discharge status: Home / Self Care | Payer: BC Managed Care – PPO | Source: Ambulatory Visit | Attending: Cardiology | Admitting: Cardiology

## 2011-05-28 ENCOUNTER — Encounter (HOSPITAL_BASED_OUTPATIENT_CLINIC_OR_DEPARTMENT_OTHER)
Admission: RE | Disposition: A | Discharge status: Home / Self Care | Payer: Self-pay | Source: Ambulatory Visit | Attending: Cardiology

## 2011-05-28 DIAGNOSIS — R0989 Other specified symptoms and signs involving the circulatory and respiratory systems: Secondary | ICD-10-CM | POA: Insufficient documentation

## 2011-05-28 DIAGNOSIS — I2510 Atherosclerotic heart disease of native coronary artery without angina pectoris: Secondary | ICD-10-CM | POA: Insufficient documentation

## 2011-05-28 DIAGNOSIS — I428 Other cardiomyopathies: Secondary | ICD-10-CM | POA: Insufficient documentation

## 2011-05-28 DIAGNOSIS — R0609 Other forms of dyspnea: Secondary | ICD-10-CM | POA: Insufficient documentation

## 2011-05-28 DIAGNOSIS — I509 Heart failure, unspecified: Secondary | ICD-10-CM

## 2011-05-28 LAB — POCT I-STAT 3, VENOUS BLOOD GAS (G3P V)
Acid-base deficit: 3 mmol/L — ABNORMAL HIGH (ref 0.0–2.0)
Bicarbonate: 23.8 mEq/L (ref 20.0–24.0)
O2 Saturation: 63 %
TCO2: 25 mmol/L (ref 0–100)

## 2011-05-28 LAB — POCT I-STAT GLUCOSE
Glucose, Bld: 131 mg/dL — ABNORMAL HIGH (ref 70–99)
Operator id: 221371

## 2011-05-28 LAB — POCT I-STAT 3, ART BLOOD GAS (G3+)
Acid-base deficit: 2 mmol/L (ref 0.0–2.0)
O2 Saturation: 95 %

## 2011-05-28 SURGERY — JV LEFT AND RIGHT HEART CATHETERIZATION WITH CORONARY ANGIOGRAM
Anesthesia: Moderate Sedation

## 2011-05-28 MED ORDER — SODIUM CHLORIDE 0.9 % IJ SOLN: 3.0000 mL | INTRAMUSCULAR | Status: DC | PRN

## 2011-05-28 MED ORDER — SODIUM CHLORIDE 0.9 % IV SOLN
INTRAVENOUS | Status: DC
  Administered 2011-05-28: 08:00:00 via INTRAVENOUS

## 2011-05-28 MED ORDER — ASPIRIN 81 MG PO CHEW
324.0000 mg | CHEWABLE_TABLET | ORAL | Status: AC
  Administered 2011-05-28: 324 mg via ORAL

## 2011-05-28 MED ORDER — SODIUM CHLORIDE 0.9 % IJ SOLN: 3.0000 mL | Freq: Two times a day (BID) | INTRAMUSCULAR | Status: DC

## 2011-05-28 MED ORDER — ONDANSETRON HCL 4 MG/2ML IJ SOLN: 4.0000 mg | Freq: Four times a day (QID) | INTRAMUSCULAR | Status: DC | PRN

## 2011-05-28 MED ORDER — ACETAMINOPHEN 325 MG PO TABS: 650.0000 mg | ORAL_TABLET | ORAL | Status: DC | PRN

## 2011-05-28 MED ORDER — SODIUM CHLORIDE 0.9 % IV SOLN: INTRAVENOUS | Status: DC

## 2011-05-28 MED ORDER — SODIUM CHLORIDE 0.9 % IV SOLN
250.0000 mL | INTRAVENOUS | Status: DC | PRN
Start: 2011-05-28 — End: 2011-05-28

## 2011-05-28 NOTE — CV Procedure (Signed)
  Cardiac Catheterization Procedure Note  Name: Roberto Allen MRN: [id number] DOB: [birth year]  Procedure: Right Heart Cath, Left Heart Cath, Selective Coronary Angiography, LV angiography  Indication:  Cardiomyopathy, dyspnea  Procedural Details: The right groin was prepped, draped, and anesthetized with 1% lidocaine. Using the modified Seldinger technique a 4 French sheath was placed in the right femoral artery and a 7 French sheath was placed in the right femoral vein. A Swan-Ganz catheter was used for the right heart catheterization. Standard protocol was followed for recording of right heart pressures and sampling of oxygen saturations. Fick cardiac output was calculated. Standard Judkins catheters were used for selective coronary angiography and left ventriculography. There were no immediate procedural complications. The patient was transferred to the post catheterization recovery area for further monitoring.  Procedural Findings:   Hemodynamics:               RA 8    RV 58/10    PA 56/18  Mean 35    PCWP Mean 17    LV 154/27    AO 151/91   Oxygen saturations:    PA 63    AO 95   Cardiac Output (Fick) 4.3                               Cardiac Index (Fick) 2.0    Coronary angiography:  Coronary dominance: right  Left mainstem: Normal  Left anterior descending (LAD): The LAD wrapped the apex. There were no high grade lesions. There was diffuse luminal irregularity. The first diagonal was moderate size with 25% mid stenosis. Second diagonal is moderate size with ostial 25% stenosis.  Left circumflex (LCx): AV groove moderate-sized with luminal irregularities. Ramus intermediate moderate sized and normal. First obtuse marginal large with proximal 25% stenosis. Second and third obtuse marginal small and normal. Posterior lateral small and normal.  Right coronary artery (RCA): Dominant. Long proximal 25% stenosis. Distal 40% stenosis before the PDA. PDA was small to moderate  size no high grade  Left ventriculography: Left ventricular systolic function is normal, LVEF is estimated at 30%, there is no significant mitral regurgitation   Final Conclusions:  Nonobstructive coronary disease. Moderately severe global left ventricular dysfunction. He has mild to moderately elevated pulmonary pressures.  Nonobstructive coronary disease.Recommendations:   Continued medical management.   [name] 05/28/2011, 9:27 AM

## 2011-05-28 NOTE — H&P (View-Only) (Signed)
   HPI The patient is a pleasant gentleman without past cardiac history. I saw him for the first time recently to evaluate dyspnea.  He had a recent CXR with pulmonary edema and an elevated proBNP. At the last appt I sent him for an echocardiogram which demonstrated an EF of 30%  He returns for follow up of this.  Since I last saw him he was in the ER for dyspnea but was thought to have sleep apnea.  He was treated with IV lasix and he has felt better since then.  He has been set up for a sleep study as well.  He reports that his breathing is better than it had been since late last year. He dyspneic with exertion but is not describing currently PND or orthopnea. He's not describing cough. The chest discomfort, neck or arm discomfort. He's had no palpitations presyncope or syncope.  No Known Allergies  Current Outpatient Prescriptions  Medication Sig Dispense Refill  . albuterol (PROVENTIL HFA;VENTOLIN HFA) 108 (90 BASE) MCG/ACT inhaler Inhale 2 puffs into the lungs every 6 (six) hours as needed. For shortness of breath.      . furosemide (LASIX) 20 MG tablet Take 20 mg by mouth daily.       Marland Kitchen glipiZIDE (GLUCOTROL) 10 MG tablet Take 10 mg by mouth 2 (two) times daily before a meal.      . lisinopril (PRINIVIL,ZESTRIL) 40 MG tablet Take 40 mg by mouth daily.      . metFORMIN (GLUCOPHAGE) 1000 MG tablet Take 1,000 mg by mouth 2 (two) times daily with a meal.      . metoprolol succinate (TOPROL-XL) 50 MG 24 hr tablet Take 75 mg by mouth daily.       Marland Kitchen spironolactone (ALDACTONE) 25 MG tablet Take 1 tablet (25 mg total) by mouth daily.  30 tablet  11    Past Medical History  Diagnosis Date  . Diabetes mellitus   . COPD (chronic obstructive pulmonary disease)   . Hypertension   . Hyperlipidemia   . GERD (gastroesophageal reflux disease)   . Knee pain   . SOB (shortness of breath)   . Atelectasis     mild bibasilar  . Pneumonia     Past Surgical History  Procedure Date  . None     ROS:   As stated in the history of present illness and negative for other systems.  PHYSICAL EXAM BP 180/110  Pulse 60  Ht 5\' 11"  (1.803 m)  Wt 201 lb (91.173 kg)  BMI 28.03 kg/m2 GENERAL:  Well appearing HEENT:  Pupils equal round and reactive, fundi not visualized, oral mucosa unremarkable, partial NECK:  No jugular venous distention, waveform within normal limits, carotid upstroke brisk and symmetric, no bruits, no thyromegaly LYMPHATICS:  No cervical, inguinal adenopathy LUNGS:  Clear to auscultation bilaterally BACK:  No CVA tenderness CHEST:  Unremarkable HEART:  PMI displaced laterally, S1 and S2 within normal limits, positive S3 vs fixed split S2, positiveS4, no clicks, no rubs, no murmurs ABD:  Flat, positive bowel sounds normal in frequency in pitch, no bruits, no rebound, no guarding, no midline pulsatile mass, no hepatomegaly, no splenomegaly EXT:  2 plus pulses throughout, no edema, no cyanosis no clubbing SKIN:  No rashes no nodules NEURO:  Cranial nerves II through XII grossly intact, motor grossly intact throughout PSYCH:  Cognitively intact, oriented to person place and time   ASSESSMENT AND PLAN

## 2011-05-28 NOTE — Interval H&P Note (Signed)
History and Physical Interval Note:  05/28/2011 9:26 AM  Roberto Allen  has presented today for surgery, with the diagnosis of cp  The various methods of treatment have been discussed with the patient and family. After consideration of risks, benefits and other options for treatment, the patient has consented to  Procedure(s) (LRB): JV LEFT AND RIGHT HEART CATHETERIZATION WITH CORONARY ANGIOGRAM (N/A) as a surgical intervention .  The patients' history has been reviewed, patient examined, no change in status, stable for surgery.  I have reviewed the patients' chart and labs.  Questions were answered to the patient's satisfaction.     [name]

## 2011-05-28 NOTE — OR Nursing (Signed)
Meal served 

## 2011-05-28 NOTE — Progress Notes (Signed)
Discharge instructions completed, ambulated to bathroom without bleeding from right groin site.  Discharged to home via wheelchair with wife.

## 2011-06-09 ENCOUNTER — Encounter: Payer: Self-pay | Admitting: Pulmonary Disease

## 2011-06-09 ENCOUNTER — Ambulatory Visit (HOSPITAL_BASED_OUTPATIENT_CLINIC_OR_DEPARTMENT_OTHER): Payer: BC Managed Care – PPO | Attending: Pulmonary Disease

## 2011-06-09 VITALS — Ht 71.0 in | Wt 214.0 lb

## 2011-06-09 DIAGNOSIS — G4733 Obstructive sleep apnea (adult) (pediatric): Secondary | ICD-10-CM | POA: Insufficient documentation

## 2011-06-09 DIAGNOSIS — G4730 Sleep apnea, unspecified: Secondary | ICD-10-CM

## 2011-06-14 ENCOUNTER — Encounter: Payer: Self-pay | Admitting: Physician Assistant

## 2011-06-14 ENCOUNTER — Ambulatory Visit (INDEPENDENT_AMBULATORY_CARE_PROVIDER_SITE_OTHER): Payer: BC Managed Care – PPO | Admitting: Physician Assistant

## 2011-06-14 ENCOUNTER — Telehealth: Payer: Self-pay | Admitting: *Deleted

## 2011-06-14 VITALS — BP 180/110 | HR 64 | Ht 71.0 in | Wt 211.0 lb

## 2011-06-14 DIAGNOSIS — E785 Hyperlipidemia, unspecified: Secondary | ICD-10-CM | POA: Insufficient documentation

## 2011-06-14 DIAGNOSIS — I509 Heart failure, unspecified: Secondary | ICD-10-CM

## 2011-06-14 DIAGNOSIS — I2510 Atherosclerotic heart disease of native coronary artery without angina pectoris: Secondary | ICD-10-CM | POA: Insufficient documentation

## 2011-06-14 DIAGNOSIS — E1169 Type 2 diabetes mellitus with other specified complication: Secondary | ICD-10-CM | POA: Insufficient documentation

## 2011-06-14 DIAGNOSIS — R0602 Shortness of breath: Secondary | ICD-10-CM

## 2011-06-14 DIAGNOSIS — I10 Essential (primary) hypertension: Secondary | ICD-10-CM

## 2011-06-14 LAB — BASIC METABOLIC PANEL
CO2: 28 mEq/L (ref 19–32)
Calcium: 9.4 mg/dL (ref 8.4–10.5)
Creatinine, Ser: 1.1 mg/dL (ref 0.4–1.5)
GFR: 72.96 mL/min (ref >60.00)
Sodium: 141 mEq/L (ref 135–145)

## 2011-06-14 LAB — BRAIN NATRIURETIC PEPTIDE: Pro B Natriuretic peptide (BNP): 384 pg/mL — ABNORMAL HIGH (ref 0.0–100.0)

## 2011-06-14 LAB — LIPID PANEL
Cholesterol: 189 mg/dL (ref 0–200)
HDL: 47.7 mg/dL (ref >39.00)
LDL Cholesterol: 104 mg/dL — ABNORMAL HIGH (ref 0–99)
Triglycerides: 187 mg/dL — ABNORMAL HIGH (ref 0.0–149.0)

## 2011-06-14 LAB — HEPATIC FUNCTION PANEL
ALT: 19 U/L (ref 0–53)
Total Bilirubin: 0.5 mg/dL (ref 0.3–1.2)

## 2011-06-14 MED ORDER — PRAVASTATIN SODIUM 20 MG PO TABS: 20.0000 mg | ORAL_TABLET | Freq: Every evening | ORAL | Status: DC

## 2011-06-14 MED ORDER — ISOSORBIDE MONONITRATE ER 30 MG PO TB24: 30.0000 mg | ORAL_TABLET | Freq: Every day | ORAL | Status: DC

## 2011-06-14 MED ORDER — HYDRALAZINE HCL 25 MG PO TABS: 25.0000 mg | ORAL_TABLET | Freq: Three times a day (TID) | ORAL | Status: DC

## 2011-06-14 NOTE — Progress Notes (Signed)
[place], [place]  [address] Phone: [phone] Fax:  [phone]  Date:  06/14/2011   Name:  Roberto Allen       DOB:  1960-07-05 MRN:  [id number]  PCP:  Grayland Ormond, MD, MD  Primary Cardiologist:  Dr. [name]  Primary Electrophysiologist:  None    History of Present Illness: Roberto Allen is a 51 y.o. male who returns for post cath follow up.  He has a h/o DM2, HTN, hyperlipidemia.  He was recently diagnosed with dilated cardiomyopathy/systolic CHF.  Echo 05/04/11: mild LVH, EF 30-35%, Grade 3 diast dysfxn, mod LAE.  He was seen by Dr. [name] on 3/26 and set up for cardiac cath.  Cardiac Catheterization 05/28/11: Left mainstem: Normal  Left anterior descending (LAD): The LAD wrapped the apex. There were no high grade lesions. There was diffuse luminal irregularity. The first diagonal was moderate size with 25% mid stenosis. Second diagonal is moderate size with ostial 25% stenosis.  Left circumflex (LCx): AV groove moderate-sized with luminal irregularities. Ramus intermediate moderate sized and normal. First obtuse marginal large with proximal 25% stenosis. Second and third obtuse marginal small and normal. Posterior lateral small and normal.  Right coronary artery (RCA): Dominant. Long proximal 25% stenosis. Distal 40% stenosis before the PDA. PDA was small to moderate size no high grade  Left ventriculography: Left ventricular systolic function is normal, LVEF is estimated at 30%, there is no significant mitral regurgitation  Final Conclusions: Nonobstructive coronary disease. Moderately severe global left ventricular dysfunction. He has mild to moderately elevated pulmonary pressures.  Nonobstructive coronary disease.Recommendations: Continued medical management  Doing well.  Notes dyspnea with more extreme activities.  No orthopnea, PND, edema.  No chest pain.  No syncope.  Has minimal cough.  Questions if he is  building up with fluid again but denies symptoms similar to previous.  Admits compliance with meds.  Trying to watch Na in diet.    Past Medical History  Diagnosis Date  . Diabetes mellitus   . COPD (chronic obstructive pulmonary disease)   . Hypertension   . Hyperlipidemia   . GERD (gastroesophageal reflux disease)   . Knee pain   . History of pneumonia   . CAD (coronary artery disease)     LHC 4/13: mD1 25%, oD2 25%, pOM1 25%, pRCA 25%, dRCA 40%, EF 30%  . Chronic systolic heart failure   . NICM (nonischemic cardiomyopathy)     echo 3/13: mild LVH, EF 30-35%, grade 3 diast dysfxn, mod LAE;  RHC 4/13:  RA 8, RV 58/10, PA 56/18, mean 35, PCWP mean 17, LV 154/27, CO 4.3, CI 2.0    Current Outpatient Prescriptions  Medication Sig Dispense Refill  . albuterol (PROVENTIL HFA;VENTOLIN HFA) 108 (90 BASE) MCG/ACT inhaler Inhale 2 puffs into the lungs every 6 (six) hours as needed. For shortness of breath.      . furosemide (LASIX) 20 MG tablet Take 20 mg by mouth daily.       Marland Kitchen glipiZIDE (GLUCOTROL) 10 MG tablet Take 10 mg by mouth 2 (two) times daily before a meal.      . lisinopril (PRINIVIL,ZESTRIL) 40 MG tablet Take 40 mg by mouth daily.      . metFORMIN (GLUCOPHAGE) 1000 MG tablet Take 1,000 mg by mouth 2 (two) times daily with a meal.      . metoprolol succinate (TOPROL-XL) 100 MG 24 hr tablet Take 1 tablet (100 mg total) by mouth daily.  30 tablet  11  . potassium chloride SA (K-DUR,KLOR-CON) 20 MEQ tablet Take 20 mEq by mouth daily. Please take two today and one everyday thereafte      . spironolactone (ALDACTONE) 50 MG tablet Take 1 tablet (50 mg total) by mouth daily.  30 tablet  11  . DISCONTD: potassium chloride (KLOR-CON) 20 MEQ packet Take 20 mEq by mouth 2 (two) times daily.        Allergies: No Known Allergies  History  Substance Use Topics  . Smoking status: Former Smoker -- 1.0 packs/day for 20 years    Types: Cigarettes    Quit date: 09/15/2006  . Smokeless tobacco:  Not on file  . Alcohol Use: Yes     occ     ROS:  Please see the history of present illness.    All other systems reviewed and negative.   PHYSICAL EXAM: VS:  BP 180/110  Pulse 64  Ht 5\' 11"  (1.803 m)  Wt 211 lb (95.709 kg)  BMI 29.43 kg/m2 Repeat BP by me: 160/104  Well nourished, well developed, in no acute distress HEENT: normal Neck: no JVD Cardiac:  normal S1, S2; RRR; no murmur Lungs:  Decreased breath sounds bilaterally, no wheezing, rhonchi or rales Abd: soft, nontender, no hepatomegaly Ext: no edema; right groin without hematoma or bruit  Skin: warm and dry Neuro:  CNs 2-12 intact, no focal abnormalities noted  EKG:  NSR, HR 64, TW inversions 1, aVL   Labs: Lab Results  Component Value Date   CREATININE 1.3 05/25/2011   BUN 16 05/25/2011   NA 141 05/25/2011   K 4.7 05/25/2011   CL 104 05/25/2011   CO2 30 05/25/2011     Weights: Wt Readings from Last 3 Encounters:  06/14/11 211 lb (95.709 kg)  06/09/11 214 lb (97.07 kg)  05/28/11 203 lb (92.08 kg)    ASSESSMENT AND PLAN:  1. Chronic Systolic CHF due to Non-Ischemic Cardiomyopathy with EF 30-35%  Volume appears stable.  But he is up 10 lbs since last visit.  Has noted some dyspnea but not reminiscent of prior exacerbation.  Exam unremarkable.  Had some elevated pressures at [place].  Will check bmet and bnp today.  Adjust Lasix if BNP up.  Add hydralazine and isosorbide as noted below to treat BP.  Follow up with me in 2 weeks and Dr. [name] in 6 weeks.  If BP still up at follow up, will consider changing Toprol to Coreg.    2. Hypertension  Add Hydralazine 25 mg TID.  Add Isosorbide 30 mg daily.  Follow up as above.   3. Coronary Artery Disease (non-obstructive by cath 05/2011)  Continue ASA.  As he is diabetic, would benefit from statin Rx.  Add Pravastatin 20 mg QHS.  Check FLP and Lipids today as he is fasting and repeat in 2 mos.     4. Hyperlipidemia  As above.   5. Non-Ischemic Cardiomyopathy Likely  related to HTN.  Continue aggressive BP management.       Signed, Richardson Dopp, PA-C  8:30 AM 06/14/2011

## 2011-06-14 NOTE — Telephone Encounter (Signed)
Message copied by Michae Kava on Mon Jun 14, 2011  3:35 PM ------      Message from: [place], [place] T      Created: Mon Jun 14, 2011  2:45 PM       BNP up slightly      Increase Lasix from 20 mg to 40 mg QD x 3 days and take extra K+ 20 mEq daily for 3 days      Repeat BMET in one week      Weigh daily and call if:  Weight up 3 lbs in one day, increased swelling or increased dyspnea.       Richardson Dopp, PA-C  2:44 PM 06/14/2011

## 2011-06-14 NOTE — Patient Instructions (Addendum)
PLEASE MAKE TO SEE SCOTT WEAVER, PAC IN APPROX 2 WEEKS  PLEASE  MAKE APPT TO SEE DR. HOCHREIN IN APPROX 6 WEEKS  Your physician has recommended you make the following change in your medication: START HYDRALAZINE 25 MG 1 TABLET 3 TIMES DAILY; START IMDUR 30 MG 1 TABLET DAILY; START PRAVASTATIN 20 MG 1 TABLET EVERY NIGHT   YOU HAVE BEEN GIVEN A 2 GRAM SODIUM 2 Gram Low Sodium Diet A 2 gram sodium diet restricts the amount of sodium in the diet to no more than 2 g or 2000 mg daily. Limiting the amount of sodium is often used to help lower blood pressure. It is important if you have heart, liver, or kidney problems. Many foods contain sodium for flavor and sometimes as a preservative. When the amount of sodium in a diet needs to be low, it is important to know what to look for when choosing foods and drinks. The following includes some information and guidelines to help make it easier for you to adapt to a low sodium diet. QUICK TIPS  Do not add salt to food.   Avoid convenience items and fast food.   Choose unsalted snack foods.   Buy lower sodium products, often labeled as "lower sodium" or "no salt added."   Check food labels to learn how much sodium is in 1 serving.   When eating at a restaurant, ask that your food be prepared with less salt or none, if possible.  READING FOOD LABELS FOR SODIUM INFORMATION The nutrition facts label is a good place to find how much sodium is in foods. Look for products with no more than 500 to 600 mg of sodium per meal and no more than 150 mg per serving. Remember that 2 g = 2000 mg. The food label may also list foods as:  Sodium-free: Less than 5 mg in a serving.   Very low sodium: 35 mg or less in a serving.   Low-sodium: 140 mg or less in a serving.   Light in sodium: 50% less sodium in a serving. For example, if a food that usually has 300 mg of sodium is changed to become light in sodium, it will have 150 mg of sodium.   Reduced sodium: 25%  less sodium in a serving. For example, if a food that usually has 400 mg of sodium is changed to reduced sodium, it will have 300 mg of sodium.  CHOOSING FOODS Grains  Avoid: Salted crackers and snack items. Some cereals, including instant hot cereals. Bread stuffing and biscuit mixes. Seasoned rice or pasta mixes.   Choose: Unsalted snack items. Low-sodium cereals, oats, puffed wheat and rice, shredded wheat. English muffins and bread. Pasta.  Meats  Avoid: Salted, canned, smoked, spiced, pickled meats, including fish and poultry. Bacon, ham, sausage, cold cuts, hot dogs, anchovies.   Choose: Low-sodium canned tuna and salmon. Fresh or frozen meat, poultry, and fish.  Dairy  Avoid: Processed cheese and spreads. Cottage cheese. Buttermilk and condensed milk. Regular cheese.   Choose: Milk. Low-sodium cottage cheese. Yogurt. Sour cream. Low-sodium cheese.  Fruits and Vegetables  Avoid: Regular canned vegetables. Regular canned tomato sauce and paste. Frozen vegetables in sauces. Olives. Angie Fava. Relishes. Sauerkraut.   Choose: Low-sodium canned vegetables. Low-sodium tomato sauce and paste. Frozen or fresh vegetables. Fresh and frozen fruit.  Condiments  Avoid: Canned and packaged gravies. Worcestershire sauce. Tartar sauce. Barbecue sauce. Soy sauce. Steak sauce. Ketchup. Onion, garlic, and table salt. Meat flavorings and tenderizers.  Choose: Fresh and dried herbs and spices. Low-sodium varieties of mustard and ketchup. Lemon juice. Tabasco sauce. Horseradish.  SAMPLE 2 GRAM SODIUM MEAL PLAN Breakfast / Sodium (mg)  1 cup low-fat milk / [id number] mg   2 slices whole-wheat toast / 270 mg   1 tbs heart-healthy margarine / 153 mg   1 hard-boiled egg / 139 mg   1 small orange / 0 mg  Lunch / Sodium (mg)  1 cup raw carrots / 76 mg    cup hummus / 298 mg   1 cup low-fat milk / 143 mg    cup red grapes / 2 mg   1 whole-wheat pita bread / 356 mg  Dinner / Sodium (mg)  1 cup  whole-wheat pasta / 2 mg   1 cup low-sodium tomato sauce / 73 mg   3 oz lean ground beef / 57 mg   1 small side salad (1 cup raw spinach leaves,  cup cucumber,  cup yellow bell pepper) with 1 tsp olive oil and 1 tsp red wine vinegar / 25 mg  Snack / Sodium (mg)  1 container low-fat vanilla yogurt / 107 mg   3 graham cracker squares / 127 mg  Nutrient Analysis  Calories: 2033   Protein: 77 g   Carbohydrate: 282 g   Fat: 72 g   Sodium: 1971 mg  Document Released: 02/01/2005 Document Revised: 01/21/2011 Document Reviewed: 05/05/2009 [facility] Patient Information 2012 [place], [place].

## 2011-06-15 MED ORDER — FUROSEMIDE 20 MG PO TABS: ORAL_TABLET | ORAL | Status: DC

## 2011-06-15 MED ORDER — POTASSIUM CHLORIDE CRYS ER 20 MEQ PO TBCR: EXTENDED_RELEASE_TABLET | ORAL | Status: DC

## 2011-06-15 NOTE — Telephone Encounter (Signed)
lmom x 2, lm w/recommendations from [employer]. PAC and for ptcb to let me know he understands recommendations and needs repeat bmet 1 week;  bmet order placed for 06/25/11 since I have not been able to get in touch with the pt yet. Med list reflects change with lasix 40 mg x 3 days then resume 20 mg daily and K+ 40 meq x 3 days then resume 20 meq daily.

## 2011-06-24 DIAGNOSIS — G4733 Obstructive sleep apnea (adult) (pediatric): Secondary | ICD-10-CM

## 2011-06-25 NOTE — Procedures (Signed)
NAMEERI, SCHU NO.:  [id number]  MEDICAL RECORD NO.:  [id number]          PATIENT TYPE:  OUT  LOCATION:  SLEEP CENTER                 FACILITY:  [facility]  PHYSICIAN:  Kathee Delton, MD,FCCPDATE OF BIRTH:  07-11-1958  DATE OF STUDY:  06/09/2011                           NOCTURNAL POLYSOMNOGRAM  REFERRING PHYSICIAN:  Kathee Delton, MD,FCCP  INDICATION FOR STUDY:  Hypersomnia with sleep apnea.  EPWORTH SLEEPINESS SCORE:  13.  MEDICATIONS:  SLEEP ARCHITECTURE:  The patient had a total sleep time of 284 minutes with no slow wave sleep noted and only 27 minutes of REM.  Sleep onset latency was prolonged at 46 minutes, and REM onset was normal at 52 minutes.  Sleep efficiency was moderately reduced at 75%.  RESPIRATORY DATA:  The patient was found to have 5 apneas and 33 obstructive hypopneas, giving him an apnea-hypopnea index of 8 events per hour.  The events occurred in all body positions, and there was moderate snoring noted throughout.  OXYGEN DATA:  There was O2 desaturation transiently as low as 87% with the patient's obstructive events.  CARDIAC DATA:  Rare PVC noted, but no clinically significant arrhythmias were seen.  MOVEMENT-PARASOMNIA:  No significant periodic limb movements or abnormal behaviors were noted.  IMPRESSIONS-RECOMMENDATIONS: 1. Very mild obstructive sleep apnea/hypopnea syndrome with an apnea-     hypopnea index of 8 events per hour and O2 desaturation as low as     87%.  Treatment for this degree of sleep apnea can include a trial     of weight loss alone, upper airway surgery, dental appliance, and     also continuous positive airway pressure.  Clinical correlation is     suggested. 2. Rare premature ventricular contraction noted, but no clinically     significant arrhythmias were noted.    Kathee Delton, MD,FCCP [place], [place] Board of Sleep Medicine   [facility]  D:  06/24/2011 08:47:50  T:  06/25/2011  02:00:52  Job:  [id number]

## 2011-06-30 ENCOUNTER — Telehealth: Payer: Self-pay | Admitting: Pulmonary Disease

## 2011-06-30 ENCOUNTER — Encounter: Payer: Self-pay | Admitting: Physician Assistant

## 2011-06-30 ENCOUNTER — Ambulatory Visit (INDEPENDENT_AMBULATORY_CARE_PROVIDER_SITE_OTHER): Payer: BC Managed Care – PPO | Admitting: Physician Assistant

## 2011-06-30 VITALS — BP 130/80 | HR 56 | Ht 71.0 in | Wt 209.0 lb

## 2011-06-30 DIAGNOSIS — I5022 Chronic systolic (congestive) heart failure: Secondary | ICD-10-CM

## 2011-06-30 DIAGNOSIS — R0602 Shortness of breath: Secondary | ICD-10-CM

## 2011-06-30 DIAGNOSIS — I10 Essential (primary) hypertension: Secondary | ICD-10-CM

## 2011-06-30 NOTE — Telephone Encounter (Signed)
LMOM on 5/10 and 5/15 asking pt to return my call. OV to review sleep results is needed with [place].

## 2011-06-30 NOTE — Progress Notes (Signed)
[place] [place], [place]  [address] Phone: [phone] Fax:  [phone]  Date:  06/30/2011   Name:  Mchenry Laba    DOB:  10-Jul-1960   MRN:  [id number]  PCP:  Grayland Ormond, MD, MD  Primary Cardiologist:  Dr. [name]  Primary Electrophysiologist:  None    History of Present Illness: Roberto Allen is a 51 y.o. male who returns for follow up.  He has a h/o DM2, HTN, hyperlipidemia.  He was recently diagnosed with dilated cardiomyopathy/systolic CHF.  Echo 05/04/11: mild LVH, EF 30-35%, Grade 3 diast dysfxn, mod LAE.  LHC 4/13 demonstrated non-obstructive disease: mD1 25%, oD2 25%, pOM1 25%, pRCA 25%, dRCA 40%, EF 30%.  I saw him in followup 06/14/11.  Volume was stable.  BNP was minimally elevated with 10 pound weight gain.  I adjusted his Lasix for a few days.  Hydralazine and isosorbide was added to his medical regimen due to elevated blood pressures.  Statin was added.  He returns for followup.  Overall, doing well.  He describes class 2b dyspnea.  He denies orthopnea, PND or significant pedal edema.  No chest pain, syncope, cough.  Past Medical History  Diagnosis Date  . Diabetes mellitus   . COPD (chronic obstructive pulmonary disease)   . Hypertension   . Hyperlipidemia   . GERD (gastroesophageal reflux disease)   . Knee pain   . History of pneumonia   . CAD (coronary artery disease)     LHC 4/13: mD1 25%, oD2 25%, pOM1 25%, pRCA 25%, dRCA 40%, EF 30%  . Chronic systolic heart failure   . NICM (nonischemic cardiomyopathy)     echo 3/13: mild LVH, EF 30-35%, grade 3 diast dysfxn, mod LAE;  RHC 4/13:  RA 8, RV 58/10, PA 56/18, mean 35, PCWP mean 17, LV 154/27, CO 4.3, CI 2.0    Current Outpatient Prescriptions  Medication Sig Dispense Refill  . albuterol (PROVENTIL HFA;VENTOLIN HFA) 108 (90 BASE) MCG/ACT inhaler Inhale 2 puffs into the lungs every 6 (six) hours as needed. For shortness of breath.      Marland Kitchen aspirin 81 MG tablet  Take 81 mg by mouth daily.      . furosemide (LASIX) 20 MG tablet TAKE 40 MG DAILY FOR 3 DAYS THEN RESUME 20 MG DAILY  45 tablet  2  . glipiZIDE (GLUCOTROL) 10 MG tablet Take 10 mg by mouth 2 (two) times daily before a meal.      . hydrALAZINE (APRESOLINE) 25 MG tablet Take 1 tablet (25 mg total) by mouth 3 (three) times daily.  90 tablet  11  . isosorbide mononitrate (IMDUR) 30 MG 24 hr tablet Take 1 tablet (30 mg total) by mouth daily.  30 tablet  11  . lisinopril (PRINIVIL,ZESTRIL) 40 MG tablet Take 40 mg by mouth daily.      . metFORMIN (GLUCOPHAGE) 1000 MG tablet Take 1,000 mg by mouth 2 (two) times daily with a meal.      . metoprolol succinate (TOPROL-XL) 100 MG 24 hr tablet Take 100 mg by mouth daily.      . potassium chloride SA (K-DUR,KLOR-CON) 20 MEQ tablet TAKE 40 MEQ FOR 3 DAYS ONLY THEN RESUME 20 MEQ DAILY  45 tablet  2  . pravastatin (PRAVACHOL) 20 MG tablet Take 1 tablet (20 mg total) by mouth every evening.  30 tablet  11  . spironolactone (ALDACTONE) 50 MG tablet Take 1 tablet (50 mg total) by mouth daily.  30 tablet  11  . DISCONTD: metoprolol succinate (TOPROL-XL) 100 MG 24 hr tablet Take 1 tablet (100 mg total) by mouth daily.  30 tablet  11  . DISCONTD: potassium chloride (KLOR-CON) 20 MEQ packet Take 20 mEq by mouth 2 (two) times daily.        Allergies: No Known Allergies  History  Substance Use Topics  . Smoking status: Former Smoker -- 1.0 packs/day for 20 years    Types: Cigarettes    Quit date: 09/15/2006  . Smokeless tobacco: Never Used  . Alcohol Use: Yes     occ     ROS:  Please see the history of present illness.     All other systems reviewed and negative.   PHYSICAL EXAM: VS:  BP 130/80  Pulse 56  Ht 5\' 11"  (1.803 m)  Wt 209 lb (94.802 kg)  BMI 29.15 kg/m2  Well nourished, well developed, in no acute distress HEENT: normal Neck: no JVD Cardiac:  normal S1, S2; RRR; no murmur Lungs:  Decreased breath sounds bilaterally, no wheezing, rhonchi or  rales Abd: soft, nontender, no hepatomegaly Ext: no edema   Skin: warm and dry Neuro:  CNs 2-12 intact, no focal abnormalities noted  EKG:  Sinus bradycardia, heart rate 56, normal axis, nonspecific ST-T wave changes, no change from prior tracing  Potassium  Date/Time Value Range Status  06/14/2011 10:54 AM 4.2  3.5-5.1 (mEq/L) Final     Creatinine, Ser  Date/Time Value Range Status  06/14/2011 10:54 AM 1.1  0.4-1.5 (mg/dL) Final     Pro B Natriuretic peptide (BNP)  Date/Time Value Range Status  06/14/2011 10:54 AM 384.0* 0.0-100.0 (pg/mL) Final     Weights: Wt Readings from Last 3 Encounters:  06/30/11 209 lb (94.802 kg)  06/14/11 211 lb (95.709 kg)  06/09/11 214 lb (97.07 kg)    ASSESSMENT AND PLAN:  1.  Hypertension Much improved.  Continue current regimen  2.  Chronic systolic congestive heart failure Volume stable.  Followup with Dr. Percival Spanish as planned.  3.  Nonobstructive coronary artery disease No angina.  Continue ASA.  4.  Dyslipidemia Continue statin.  Check L/L next month.   Danton Sewer, PA-C  9:29 AM 06/30/2011

## 2011-06-30 NOTE — Patient Instructions (Signed)
Your physician recommends that you schedule a follow-up appointment in: 08/04/2011 WITH DR. HOCHREIN  CUT LASIX 20 MG DAILY AND CUT POTASSIUM 20 MEQ DAILY  NO OTHER CHANGES WERE MADE TODAY

## 2011-07-06 NOTE — Telephone Encounter (Signed)
Pt is scheduled for f/u of sleep results on Tues., 5/28 @ 4:30 with [place].

## 2011-07-06 NOTE — Telephone Encounter (Signed)
Spoke with the pt and he said he would have to check his schedule and give me a call back regarding an appt for follow-up on his sleep test.

## 2011-07-13 ENCOUNTER — Ambulatory Visit (INDEPENDENT_AMBULATORY_CARE_PROVIDER_SITE_OTHER): Payer: BC Managed Care – PPO | Admitting: Pulmonary Disease

## 2011-07-13 ENCOUNTER — Encounter: Payer: Self-pay | Admitting: Pulmonary Disease

## 2011-07-13 VITALS — BP 116/78 | HR 82 | Temp 98.0°F | Ht 71.0 in | Wt 212.6 lb

## 2011-07-13 DIAGNOSIS — G4730 Sleep apnea, unspecified: Secondary | ICD-10-CM

## 2011-07-13 DIAGNOSIS — G4733 Obstructive sleep apnea (adult) (pediatric): Secondary | ICD-10-CM

## 2011-07-13 NOTE — Assessment & Plan Note (Addendum)
The patient has mild obstructive sleep apnea by his recent sleep study, but is having significant sleep disruption and daytime sleepiness with inactivity.  Even this is not a significant risk to his cardiovascular health, it seems to be having a big impact on his quality of life.  I have outlined various treatment options for his mild sleep apnea, but feel that a trial of CPAP is easy to start and also discontinue.  He is willing to give this a try. I will set the patient up on cpap at a moderate pressure level to allow for desensitization, and will troubleshoot the device over the next 4-6weeks if needed.  The pt is to call me if having issues with tolerance.  Will then optimize the pressure once patient is able to wear cpap on a consistent basis.

## 2011-07-13 NOTE — Patient Instructions (Signed)
Will start you on cpap at a moderate level to see if we can improve your symptoms. Work on modest weight loss followup with me in 6 weeks, but call if having tolerance issues.

## 2011-07-13 NOTE — Progress Notes (Signed)
  Subjective:    Patient ID: Roberto Allen, male    DOB: 1960/09/07, 51 y.o.   MRN: [id number]  HPI The pt comes in today for review of his recent sleep study.  He was found to have an AHI 8/hr with desat to 87%.  I have reviewed the study with him in detail, and answered all of his questions.    Review of Systems  Constitutional: Negative.  Negative for fever and unexpected weight change.  HENT: Negative.  Negative for ear pain, nosebleeds, congestion, sore throat, rhinorrhea, sneezing, trouble swallowing, dental problem, postnasal drip and sinus pressure.   Eyes: Negative.  Negative for redness and itching.  Respiratory: Negative.  Negative for cough, chest tightness, shortness of breath and wheezing.   Cardiovascular: Negative.  Negative for palpitations and leg swelling.  Gastrointestinal: Negative.  Negative for nausea and vomiting.  Genitourinary: Negative.  Negative for dysuria.  Musculoskeletal: Negative.  Negative for joint swelling.  Skin: Negative.  Negative for rash.  Neurological: Negative.  Negative for headaches.  Hematological: Negative.  Does not bruise/bleed easily.  Psychiatric/Behavioral: Negative.  Negative for dysphoric mood. The patient is not nervous/anxious.        Objective:   Physical Exam Ow male in nad Nose without purulence or discharge LE without edema, no cyanosis  Appears sleepy, moves all 4.       Assessment & Plan:

## 2011-08-04 ENCOUNTER — Encounter: Payer: Self-pay | Admitting: Cardiology

## 2011-08-04 ENCOUNTER — Ambulatory Visit (INDEPENDENT_AMBULATORY_CARE_PROVIDER_SITE_OTHER): Payer: BC Managed Care – PPO | Admitting: Cardiology

## 2011-08-04 VITALS — BP 131/80 | HR 62 | Ht 71.0 in | Wt 212.4 lb

## 2011-08-04 DIAGNOSIS — G4733 Obstructive sleep apnea (adult) (pediatric): Secondary | ICD-10-CM

## 2011-08-04 DIAGNOSIS — E785 Hyperlipidemia, unspecified: Secondary | ICD-10-CM

## 2011-08-04 DIAGNOSIS — I10 Essential (primary) hypertension: Secondary | ICD-10-CM

## 2011-08-04 MED ORDER — HYDRALAZINE HCL 25 MG PO TABS: 37.5000 mg | ORAL_TABLET | Freq: Three times a day (TID) | ORAL | Status: DC

## 2011-08-04 MED ORDER — ISOSORBIDE MONONITRATE ER 60 MG PO TB24: 60.0000 mg | ORAL_TABLET | Freq: Every day | ORAL | Status: DC

## 2011-08-04 NOTE — Patient Instructions (Addendum)
Please increase you Imdur to 60 mg a day and you Hydralazine to 37.5 mg three times a day (1 and 1/2  Tablets) Continue all other medications as listed  Please return for fasting blood work (Lipid panel) in 6 weeks.  Follow up with Dr Percival Spanish in 6 weeks.     Heart Failure  Heart failure (HF) means your heart has trouble pumping blood. The blood is not circulated very well in your body because your heart is weak. HF may cause blood to back up into your lungs. This is commonly called "fluid in the lungs." HF may also cause your ankles and legs to puff up (swell). It is important to take good care of yourself when you have HF. [place]  Take your medicine as told by your doctor.   Do not stop taking your medicine unless told to by your doctor.   Be sure to get your medicine refilled before it runs out.   Do not skip any doses of medicine.   Tell your doctor if you cannot afford your medicine.   Keep a list of all the medicine you take. This should include the name, how much you take, and when you take it.   Ask your doctor if you have any questions about your medicine. Do not take over-the-counter medicine unless your doctor says it is okay.  What you eat  Do not drink alcohol unless your doctor says it is okay.   Avoid food that is high in fat. Avoid foods fried in oil or made with fat.   Eat a healthy diet. A dietitian can help you with healthy food choices.   Limit how much salt you eat. Do not eat more than 1500 milligrams (mg) of salt (sodium) a day.   Do not add salt to your food.   Do not eat food made with a lot of salt. Here are some examples:   Canned vegetables.   Canned soups.   Canned drinks.   Hot dogs.   Fast food.   Pizza.   Chips.  Check your weight  Weigh yourself every morning. You should do this after you pee (urinate) and before you eat breakfast.   Wear the same amount of clothes each time you weigh yourself.   Write down  your weight every day. Tell your doctor if you gain 3 lb/1.4 kg or more in 1 day or 5 lb/2.3 kg in a week.  Blood pressure monitoring  Buy a home blood pressure cuff.   Check your blood pressure as told by your doctor. Write down your blood pressure numbers on a sheet of paper.   Bring your blood pressure numbers to your doctor visits.  Smoking  Smoking is bad for your heart.   Ask your doctor how to stop smoking.  Exercise  Talk to your doctor about exercise.   Ask how much exercise is right for you.   Exercise as much as you can. Stop if you feel tired, have problems breathing, or have chest pain.  Keep all your doctor appointments. GET HELP RIGHT AWAY IF:   You have trouble breathing.   You have a cough that does not go away.   You cannot sleep because you have trouble breathing.   You gain 3 lb/1.4 kg or more in 1 day or 5 lb/2.3 kg in a week.   You have puffy ankles or legs.   You have an enlarged (bloated) belly (abdomen).   You pass  out (faint).   You have really bad chest pain or pressure. This includes pain or pressure in your:   Arms.   Jaw.   Neck.   Back.  If you have any of the above problems, call your local emergency services (911 in [place] Do not drive yourself to the hospital. MAKE SURE YOU:   Understand these instructions.   Will watch your condition.   Will get help right away if you are not doing well or get worse.  Document Released: 11/11/2007 Document Revised: 01/21/2011 Document Reviewed: 06/04/2008 [facility] Patient Information 2012 [place].

## 2011-08-04 NOTE — Assessment & Plan Note (Signed)
I will follow his lipids in about a month or when he comes back. He should come back fasting for that appointment.

## 2011-08-04 NOTE — Assessment & Plan Note (Signed)
Today I educated him further on heart failure and we will provide him with information. We discussed daily weights and salt restriction. I will increase his hydralazine to 37.5 mg 3 times a day and his Imdur to 60 mg daily.

## 2011-08-04 NOTE — Progress Notes (Signed)
HPI The patient presents for followup of a nonischemic cardiomyopathy. At the last visit his weight was up slightly. Hydralazine and nitrates were added as was pravastatin.  He did well with this. He denies any lightheadedness, presyncope or syncope. He thinks his breathing is much improved (Class I).  He does not have any PND or orthopnea. He has no swelling. His weight is up another 3 pounds. He's not weighing himself daily. I think he be some attention to his diet and salt. He was not aware of his ejection fraction and we reviewed this today. He denies any chest pressure, neck or arm discomfort.  No Known Allergies  Current Outpatient Prescriptions  Medication Sig Dispense Refill  . albuterol (PROVENTIL HFA;VENTOLIN HFA) 108 (90 BASE) MCG/ACT inhaler Inhale 2 puffs into the lungs every 6 (six) hours as needed. For shortness of breath.      Marland Kitchen aspirin 81 MG tablet Take 81 mg by mouth daily.      . furosemide (LASIX) 20 MG tablet Take 20 mg by mouth 2 (two) times daily.       Marland Kitchen glipiZIDE (GLUCOTROL) 10 MG tablet Take 10 mg by mouth 2 (two) times daily before a meal.      . hydrALAZINE (APRESOLINE) 25 MG tablet Take 1 tablet (25 mg total) by mouth 3 (three) times daily.  90 tablet  11  . isosorbide mononitrate (IMDUR) 30 MG 24 hr tablet Take 1 tablet (30 mg total) by mouth daily.  30 tablet  11  . lisinopril (PRINIVIL,ZESTRIL) 40 MG tablet Take 40 mg by mouth daily.      . metFORMIN (GLUCOPHAGE) 1000 MG tablet Take 1,000 mg by mouth 2 (two) times daily with a meal.      . metoprolol succinate (TOPROL-XL) 100 MG 24 hr tablet Take 100 mg by mouth daily.      . potassium chloride SA (K-DUR,KLOR-CON) 20 MEQ tablet Take 20 mEq by mouth 2 (two) times daily.       . pravastatin (PRAVACHOL) 20 MG tablet Take 1 tablet (20 mg total) by mouth every evening.  30 tablet  11  . spironolactone (ALDACTONE) 50 MG tablet Take 1 tablet (50 mg total) by mouth daily.  30 tablet  11  . DISCONTD: potassium chloride  (KLOR-CON) 20 MEQ packet Take 20 mEq by mouth 2 (two) times daily.        Past Medical History  Diagnosis Date  . Diabetes mellitus   . COPD (chronic obstructive pulmonary disease)   . Hypertension   . Hyperlipidemia   . GERD (gastroesophageal reflux disease)   . Knee pain   . History of pneumonia   . CAD (coronary artery disease)     LHC 4/13: mD1 25%, oD2 25%, pOM1 25%, pRCA 25%, dRCA 40%, EF 30%  . Chronic systolic heart failure   . NICM (nonischemic cardiomyopathy)     echo 3/13: mild LVH, EF 30-35%, grade 3 diast dysfxn, mod LAE;  RHC 4/13:  RA 8, RV 58/10, PA 56/18, mean 35, PCWP mean 17, LV 154/27, CO 4.3, CI 2.0    Past Surgical History  Procedure Date  . None     ROS:  As stated in the history of present illness and negative for other systems.  PHYSICAL EXAM BP 131/80  Pulse 62  Ht 5\' 11"  (1.803 m)  Wt 212 lb 6.4 oz (96.344 kg)  BMI 29.62 kg/m2 GENERAL:  Well appearing HEENT:  Pupils equal round and reactive, fundi not  visualized, oral mucosa unremarkable, partial NECK:  No jugular venous distention, waveform within normal limits, carotid upstroke brisk and symmetric, no bruits, no thyromegaly LYMPHATICS:  No cervical, inguinal adenopathy LUNGS:  Clear to auscultation bilaterally BACK:  No CVA tenderness CHEST:  Unremarkable HEART:  PMI displaced laterally, S1 and S2 within normal limits, positive S3 vs fixed split S2, positiveS4, no clicks, no rubs, no murmurs ABD:  Flat, positive bowel sounds normal in frequency in pitch, no bruits, no rebound, no guarding, no midline pulsatile mass, no hepatomegaly, no splenomegaly EXT:  2 plus pulses throughout, no edema, no cyanosis no clubbing   ASSESSMENT AND PLAN

## 2011-08-04 NOTE — Assessment & Plan Note (Signed)
He has met with Dr. Gwenette Greet and apparently is going to be started on CPAP.

## 2011-08-04 NOTE — Assessment & Plan Note (Signed)
This is being managed in the context of treating his CHF

## 2011-08-05 ENCOUNTER — Other Ambulatory Visit: Payer: Self-pay | Admitting: *Deleted

## 2011-08-05 ENCOUNTER — Encounter: Payer: Self-pay | Admitting: *Deleted

## 2011-08-11 ENCOUNTER — Other Ambulatory Visit: Payer: BC Managed Care – PPO

## 2011-09-03 ENCOUNTER — Ambulatory Visit: Payer: BC Managed Care – PPO | Admitting: Pulmonary Disease

## 2011-09-29 ENCOUNTER — Encounter: Payer: Self-pay | Admitting: Cardiology

## 2011-09-29 ENCOUNTER — Ambulatory Visit (INDEPENDENT_AMBULATORY_CARE_PROVIDER_SITE_OTHER): Payer: BC Managed Care – PPO | Admitting: Cardiology

## 2011-09-29 ENCOUNTER — Other Ambulatory Visit (INDEPENDENT_AMBULATORY_CARE_PROVIDER_SITE_OTHER): Payer: BC Managed Care – PPO

## 2011-09-29 VITALS — BP 125/79 | HR 68 | Ht 71.0 in | Wt 211.7 lb

## 2011-09-29 DIAGNOSIS — I2510 Atherosclerotic heart disease of native coronary artery without angina pectoris: Secondary | ICD-10-CM

## 2011-09-29 DIAGNOSIS — I5022 Chronic systolic (congestive) heart failure: Secondary | ICD-10-CM

## 2011-09-29 DIAGNOSIS — R0989 Other specified symptoms and signs involving the circulatory and respiratory systems: Secondary | ICD-10-CM

## 2011-09-29 LAB — BASIC METABOLIC PANEL
BUN: 23 mg/dL (ref 6–23)
Chloride: 101 mEq/L (ref 96–112)
GFR: 49.72 mL/min — ABNORMAL LOW (ref >60.00)
Potassium: 4.4 mEq/L (ref 3.5–5.1)

## 2011-09-29 LAB — LIPID PANEL
Cholesterol: 204 mg/dL — ABNORMAL HIGH (ref 0–200)
Total CHOL/HDL Ratio: 4

## 2011-09-29 LAB — LDL CHOLESTEROL, DIRECT: Direct LDL: 96.5 mg/dL

## 2011-09-29 MED ORDER — OMEPRAZOLE 20 MG PO CPDR: 20.0000 mg | DELAYED_RELEASE_CAPSULE | Freq: Every day | ORAL | Status: DC

## 2011-09-29 NOTE — Progress Notes (Signed)
HPI The patient presents for followup of a nonischemic cardiomyopathy. At the last visit his blood pressure was again elevated and I increased his hydralazine and nitrate dose. He did very well with this though his blood pressures at home are elevated. Today his blood pressure is excellent and he says it's excellent at every other doctor's appointment.  He thinks his blood pressure cuff likely faulty. He has been having some heartburn after eating tomato-based foods.  He otherwise denies any chest pressure, neck or arm discomfort. He thinks his breathing is excellent and he has no PND or orthopnea. He's had no palpitations, presyncope or syncope.  No Known Allergies  Current Outpatient Prescriptions  Medication Sig Dispense Refill  . albuterol (PROVENTIL HFA;VENTOLIN HFA) 108 (90 BASE) MCG/ACT inhaler Inhale 2 puffs into the lungs every 6 (six) hours as needed. For shortness of breath.      Marland Kitchen aspirin 81 MG tablet Take 81 mg by mouth daily.      . furosemide (LASIX) 20 MG tablet Take 20 mg by mouth 2 (two) times daily.       Marland Kitchen glipiZIDE (GLUCOTROL) 10 MG tablet Take 10 mg by mouth 2 (two) times daily before a meal.      . hydrALAZINE (APRESOLINE) 25 MG tablet Take 1.5 tablets (37.5 mg total) by mouth 3 (three) times daily.  135 tablet  11  . isosorbide mononitrate (IMDUR) 60 MG 24 hr tablet Take 1 tablet (60 mg total) by mouth daily.  30 tablet  11  . lisinopril (PRINIVIL,ZESTRIL) 40 MG tablet Take 40 mg by mouth daily.      . metFORMIN (GLUCOPHAGE) 1000 MG tablet Take 1,000 mg by mouth 2 (two) times daily with a meal.      . metoprolol succinate (TOPROL-XL) 100 MG 24 hr tablet Take 100 mg by mouth daily.      . potassium chloride SA (K-DUR,KLOR-CON) 20 MEQ tablet Take 20 mEq by mouth 2 (two) times daily.       . pravastatin (PRAVACHOL) 20 MG tablet Take 1 tablet (20 mg total) by mouth every evening.  30 tablet  11  . spironolactone (ALDACTONE) 50 MG tablet Take 1 tablet (50 mg total) by mouth  daily.  30 tablet  11  . DISCONTD: potassium chloride (KLOR-CON) 20 MEQ packet Take 20 mEq by mouth 2 (two) times daily.        Past Medical History  Diagnosis Date  . Diabetes mellitus   . COPD (chronic obstructive pulmonary disease)   . Hypertension   . Hyperlipidemia   . GERD (gastroesophageal reflux disease)   . Knee pain   . History of pneumonia   . CAD (coronary artery disease)     LHC 4/13: mD1 25%, oD2 25%, pOM1 25%, pRCA 25%, dRCA 40%, EF 30%  . Chronic systolic heart failure   . NICM (nonischemic cardiomyopathy)     echo 3/13: mild LVH, EF 30-35%, grade 3 diast dysfxn, mod LAE;  RHC 4/13:  RA 8, RV 58/10, PA 56/18, mean 35, PCWP mean 17, LV 154/27, CO 4.3, CI 2.0    Past Surgical History  Procedure Date  . None     ROS: Reflux.  Otherwise as stated in the history of present illness and negative for other systems.  PHYSICAL EXAM BP 125/79  Pulse 68  Ht 5\' 11"  (1.803 m)  Wt 211 lb 11.2 oz (96.026 kg)  BMI 29.53 kg/m2 GENERAL:  Well appearing HEENT:  Pupils equal round  and reactive, fundi not visualized, oral mucosa unremarkable, partial NECK:  No jugular venous distention, waveform within normal limits, carotid upstroke brisk and symmetric, no bruits, no thyromegaly LYMPHATICS:  No cervical, inguinal adenopathy LUNGS:  Clear to auscultation bilaterally BACK:  No CVA tenderness CHEST:  Unremarkable HEART:  PMI displaced laterally, S1 and S2 within normal limits, positive S3 vs fixed split S2, positiveS4, no clicks, no rubs, no murmurs ABD:  Flat, positive bowel sounds normal in frequency in pitch, no bruits, no rebound, no guarding, no midline pulsatile mass, no hepatomegaly, no splenomegaly EXT:  2 plus pulses throughout, no edema, no cyanosis no clubbing   ASSESSMENT AND PLAN  Chronic systolic heart failure -  I will continue his current medications. He is euvolemic. I will assess an echocardiogram ejection fraction at the time of his next  appointment.  Hypertension -  This is being managed in the context of treating his CHF   OSA (obstructive sleep apnea) -  He was diagnosed with this but could not afford CPAP.  He will need to concentrate on weight loss.  Hyperlipidemia -  I will check a lipid profile today as well as a chemistry given his high risk medications.   GERD -  I will give him a prescription for Prilosec and we discussed avoiding provocative foods.

## 2011-09-29 NOTE — Patient Instructions (Addendum)
Your physician has recommended you make the following change in your medication: START Prilosec 20mg  daily  Your physician recommends that you return for lab work in: today (bmet and lipid)  Your physician recommends that you schedule a follow-up appointment in: 4 months with an echo prior  Your physician has requested that you have an echocardiogram in 3-4 months prior to your next appt. Echocardiography is a painless test that uses sound waves to create images of your heart. It provides your doctor with information about the size and shape of your heart and how well your heart's chambers and valves are working. This procedure takes approximately one hour. There are no restrictions for this procedure.

## 2011-10-06 ENCOUNTER — Telehealth: Payer: Self-pay | Admitting: Cardiology

## 2011-10-06 NOTE — Telephone Encounter (Signed)
PT RTN CALL TO PAM, PLS CALL

## 2011-10-06 NOTE — Telephone Encounter (Signed)
Left message at home number for pt to call back to discuss lab work.

## 2011-10-07 NOTE — Telephone Encounter (Signed)
Left another message for pt to call to discuss lab work.  He needs repeat BMP d/t elevated crea. On last lab.  Needs to watch diet to further manage cholesterol.

## 2011-10-11 ENCOUNTER — Telehealth: Payer: Self-pay

## 2011-10-11 NOTE — Telephone Encounter (Signed)
Called patient left message to call office for lab results

## 2011-10-11 NOTE — Telephone Encounter (Signed)
Error

## 2011-10-14 ENCOUNTER — Encounter: Payer: Self-pay | Admitting: *Deleted

## 2011-11-10 ENCOUNTER — Other Ambulatory Visit: Payer: Self-pay | Admitting: Cardiology

## 2011-11-23 ENCOUNTER — Other Ambulatory Visit: Payer: Self-pay | Admitting: Physician Assistant

## 2012-01-18 ENCOUNTER — Other Ambulatory Visit: Payer: Self-pay | Admitting: Internal Medicine

## 2012-01-19 ENCOUNTER — Ambulatory Visit (HOSPITAL_COMMUNITY): Payer: BC Managed Care – PPO | Attending: Cardiology | Admitting: Radiology

## 2012-01-19 DIAGNOSIS — I5022 Chronic systolic (congestive) heart failure: Secondary | ICD-10-CM

## 2012-01-19 DIAGNOSIS — I428 Other cardiomyopathies: Secondary | ICD-10-CM | POA: Insufficient documentation

## 2012-01-19 DIAGNOSIS — E785 Hyperlipidemia, unspecified: Secondary | ICD-10-CM | POA: Insufficient documentation

## 2012-01-19 DIAGNOSIS — J449 Chronic obstructive pulmonary disease, unspecified: Secondary | ICD-10-CM | POA: Insufficient documentation

## 2012-01-19 DIAGNOSIS — I10 Essential (primary) hypertension: Secondary | ICD-10-CM | POA: Insufficient documentation

## 2012-01-19 DIAGNOSIS — J4489 Other specified chronic obstructive pulmonary disease: Secondary | ICD-10-CM | POA: Insufficient documentation

## 2012-01-19 DIAGNOSIS — I509 Heart failure, unspecified: Secondary | ICD-10-CM | POA: Insufficient documentation

## 2012-01-19 DIAGNOSIS — I2510 Atherosclerotic heart disease of native coronary artery without angina pectoris: Secondary | ICD-10-CM | POA: Insufficient documentation

## 2012-01-19 DIAGNOSIS — E119 Type 2 diabetes mellitus without complications: Secondary | ICD-10-CM | POA: Insufficient documentation

## 2012-01-19 DIAGNOSIS — G4733 Obstructive sleep apnea (adult) (pediatric): Secondary | ICD-10-CM | POA: Insufficient documentation

## 2012-01-19 NOTE — Progress Notes (Signed)
Echocardiogram performed.  

## 2012-01-26 ENCOUNTER — Ambulatory Visit (INDEPENDENT_AMBULATORY_CARE_PROVIDER_SITE_OTHER): Payer: BC Managed Care – PPO | Admitting: Cardiology

## 2012-01-26 ENCOUNTER — Encounter: Payer: Self-pay | Admitting: Cardiology

## 2012-01-26 VITALS — BP 142/85 | HR 70 | Ht 71.0 in | Wt 210.0 lb

## 2012-01-26 DIAGNOSIS — I2510 Atherosclerotic heart disease of native coronary artery without angina pectoris: Secondary | ICD-10-CM

## 2012-01-26 DIAGNOSIS — I10 Essential (primary) hypertension: Secondary | ICD-10-CM

## 2012-01-26 DIAGNOSIS — E785 Hyperlipidemia, unspecified: Secondary | ICD-10-CM

## 2012-01-26 LAB — BASIC METABOLIC PANEL
BUN: 23 mg/dL (ref 6–23)
Chloride: 99 mEq/L (ref 96–112)
Potassium: 4.4 mEq/L (ref 3.5–5.1)
Sodium: 135 mEq/L (ref 135–145)

## 2012-01-26 MED ORDER — ISOSORBIDE MONONITRATE ER 120 MG PO TB24: 120.0000 mg | ORAL_TABLET | Freq: Every day | ORAL | Status: DC

## 2012-01-26 NOTE — Patient Instructions (Addendum)
Please increase your Imdur 120 mg a day. Please take your Lisinopril at night. Continue all other medications as listed.  Please have blood work today.  Follow up in 3 months with Dr Percival Spanish.  Please sign up for My Chart if you haven't done so already.

## 2012-01-26 NOTE — Progress Notes (Signed)
HPI The patient presents for followup of a nonischemic cardiomyopathy. I have been titrating his meds for this and for blood pressure control. An echo done the other day demonstrated his EF is now about 55% which is much improved. However, he's continues to have elevated blood pressures particularly in the morning area and he does have some difficulty remembering his middle dose of hydralazine. Of note he had some renal insufficiency noted in August but we were unable to contact him by phone. He didn't understand by letter that he needed to have repeat labs.  The patient denies any new symptoms such as chest discomfort, neck or arm discomfort. There has been no new shortness of breath, PND or orthopnea. There have been no reported palpitations, presyncope or syncope.  He does still have occasional heartburn with certain foods though this is improved with Prilosec.  No Known Allergies  Current Outpatient Prescriptions  Medication Sig Dispense Refill  . albuterol (PROVENTIL HFA;VENTOLIN HFA) 108 (90 BASE) MCG/ACT inhaler Inhale 2 puffs into the lungs every 6 (six) hours as needed. For shortness of breath.      Marland Kitchen aspirin 81 MG tablet Take 81 mg by mouth daily.      . furosemide (LASIX) 20 MG tablet TAKE 1 TABLET (20 MG TOTAL) BY MOUTH 2 (TWO) TIMES DAILY.  60 tablet  1  . glipiZIDE (GLUCOTROL) 10 MG tablet Take 10 mg by mouth 2 (two) times daily before a meal.      . hydrALAZINE (APRESOLINE) 25 MG tablet Take 1.5 tablets (37.5 mg total) by mouth 3 (three) times daily.  135 tablet  11  . isosorbide mononitrate (IMDUR) 60 MG 24 hr tablet Take 1 tablet (60 mg total) by mouth daily.  30 tablet  11  . lisinopril (PRINIVIL,ZESTRIL) 40 MG tablet Take 40 mg by mouth daily.      . metFORMIN (GLUCOPHAGE) 1000 MG tablet Take 1,000 mg by mouth 2 (two) times daily with a meal.      . metoprolol succinate (TOPROL-XL) 100 MG 24 hr tablet Take 100 mg by mouth daily.      . potassium chloride SA (KLOR-CON M20) 20  MEQ tablet Take 1 tablet daily  30 tablet  6  . pravastatin (PRAVACHOL) 20 MG tablet Take 1 tablet (20 mg total) by mouth every evening.  30 tablet  11  . spironolactone (ALDACTONE) 50 MG tablet Take 1 tablet (50 mg total) by mouth daily.  30 tablet  11  . omeprazole (PRILOSEC) 20 MG capsule Take 1 capsule (20 mg total) by mouth daily.  30 capsule  11  . [DISCONTINUED] potassium chloride (KLOR-CON) 20 MEQ packet Take 20 mEq by mouth 2 (two) times daily.      . [DISCONTINUED] potassium chloride SA (K-DUR,KLOR-CON) 20 MEQ tablet Take 20 mEq by mouth 2 (two) times daily.         Past Medical History  Diagnosis Date  . Diabetes mellitus   . COPD (chronic obstructive pulmonary disease)   . Hypertension   . Hyperlipidemia   . GERD (gastroesophageal reflux disease)   . Knee pain   . History of pneumonia   . CAD (coronary artery disease)     LHC 4/13: mD1 25%, oD2 25%, pOM1 25%, pRCA 25%, dRCA 40%, EF 30%  . Chronic systolic heart failure   . NICM (nonischemic cardiomyopathy)     echo 3/13: mild LVH, EF 30-35%, grade 3 diast dysfxn, mod LAE;  RHC 4/13:  RA 8, RV  58/10, PA 56/18, mean 35, PCWP mean 17, LV 154/27, CO 4.3, CI 2.0    Past Surgical History  Procedure Date  . None     ROS: Reflux.  Otherwise as stated in the history of present illness and negative for other systems.  PHYSICAL EXAM BP 142/85  Pulse 70  Ht 5\' 11"  (1.803 m)  Wt 210 lb (95.255 kg)  BMI 29.29 kg/m2 GENERAL:  Well appearing HEENT:  Pupils equal round and reactive, fundi not visualized, oral mucosa unremarkable, partial NECK:  No jugular venous distention, waveform within normal limits, carotid upstroke brisk and symmetric, no bruits, no thyromegaly LYMPHATICS:  No cervical, inguinal adenopathy LUNGS:  Clear to auscultation bilaterally BACK:  No CVA tenderness CHEST:  Unremarkable HEART:  PMI displaced laterally, S1 and S2 within normal limits, positive S3 vs fixed split S2, positiveS4, no clicks, no rubs, no  murmurs ABD:  Flat, positive bowel sounds normal in frequency in pitch, no bruits, no rebound, no guarding, no midline pulsatile mass, no hepatomegaly, no splenomegaly EXT:  2 plus pulses throughout, no edema, no cyanosis no clubbing  EKG: Sinus rhythm, rate 70, axis within normal limits, intervals within normal limits, no acute ST-T wave changes.  01/26/2012      ASSESSMENT AND PLAN  Chronic systolic heart failure -  I will continue his current medications. He is euvolemic. His EF is now much improved.  I will titrate his Imdur to get closer to a target dose of medications.   Hypertension -  This is being managed in the context of treating his CHF.  He is not quite at target. I'll be increasing the Imdur as he will start taking his ACE inhibitor at night. Of note he will be checking a been at as his creatinine was up slightly. I reviewed this with him and told him we need to be able to get a hold of him to discuss results in the future as we had trouble doing that.  OSA (obstructive sleep apnea) -  He was diagnosed with this but could not afford CPAP.  He will need to concentrate on weight loss.  We reviewed this again today.   Hyperlipidemia -  His triglycerides were elevated in August but his LDL was at target. We discussed diet.  GERD -  He still gets some heartburn. However, he is improved on Prilosec.

## 2012-04-01 ENCOUNTER — Other Ambulatory Visit: Payer: Self-pay

## 2012-04-07 ENCOUNTER — Other Ambulatory Visit: Payer: Self-pay | Admitting: *Deleted

## 2012-04-07 MED ORDER — FUROSEMIDE 20 MG PO TABS: 20.0000 mg | ORAL_TABLET | Freq: Two times a day (BID) | ORAL | Status: DC

## 2012-04-21 ENCOUNTER — Ambulatory Visit: Payer: BC Managed Care – PPO | Admitting: Cardiology

## 2012-04-25 ENCOUNTER — Other Ambulatory Visit: Payer: Self-pay | Admitting: *Deleted

## 2012-04-25 DIAGNOSIS — E785 Hyperlipidemia, unspecified: Secondary | ICD-10-CM

## 2012-04-25 DIAGNOSIS — I10 Essential (primary) hypertension: Secondary | ICD-10-CM

## 2012-04-25 MED ORDER — LISINOPRIL 40 MG PO TABS: 40.0000 mg | ORAL_TABLET | Freq: Every day | ORAL | Status: DC

## 2012-04-25 MED ORDER — METOPROLOL SUCCINATE ER 100 MG PO TB24: 100.0000 mg | ORAL_TABLET | Freq: Every day | ORAL | Status: DC

## 2012-04-25 MED ORDER — ISOSORBIDE MONONITRATE ER 120 MG PO TB24: 120.0000 mg | ORAL_TABLET | Freq: Every day | ORAL | Status: DC

## 2012-04-25 MED ORDER — SPIRONOLACTONE 50 MG PO TABS: 50.0000 mg | ORAL_TABLET | Freq: Every day | ORAL | Status: DC

## 2012-04-25 MED ORDER — PRAVASTATIN SODIUM 20 MG PO TABS: 20.0000 mg | ORAL_TABLET | Freq: Every evening | ORAL | Status: DC

## 2012-04-25 MED ORDER — FUROSEMIDE 20 MG PO TABS: 20.0000 mg | ORAL_TABLET | Freq: Two times a day (BID) | ORAL | Status: DC

## 2012-05-21 ENCOUNTER — Other Ambulatory Visit: Payer: Self-pay | Admitting: Cardiology

## 2012-05-24 ENCOUNTER — Other Ambulatory Visit: Payer: Self-pay | Admitting: Cardiology

## 2012-06-12 ENCOUNTER — Ambulatory Visit: Payer: BC Managed Care – PPO | Admitting: Cardiology

## 2012-06-19 ENCOUNTER — Other Ambulatory Visit: Payer: Self-pay | Admitting: Physician Assistant

## 2012-07-07 ENCOUNTER — Encounter: Payer: Self-pay | Admitting: Cardiology

## 2012-07-08 ENCOUNTER — Other Ambulatory Visit: Payer: Self-pay | Admitting: Physician Assistant

## 2012-08-08 ENCOUNTER — Other Ambulatory Visit: Payer: Self-pay | Admitting: Cardiology

## 2012-08-08 NOTE — Telephone Encounter (Signed)
This med is not on this patient list could you please check this

## 2012-09-08 ENCOUNTER — Ambulatory Visit: Payer: BC Managed Care – PPO | Admitting: Cardiology

## 2012-10-06 ENCOUNTER — Ambulatory Visit (INDEPENDENT_AMBULATORY_CARE_PROVIDER_SITE_OTHER): Payer: BC Managed Care – PPO | Admitting: Cardiology

## 2012-10-06 ENCOUNTER — Encounter: Payer: Self-pay | Admitting: Cardiology

## 2012-10-06 VITALS — BP 124/75 | HR 67 | Ht 71.0 in | Wt 217.0 lb

## 2012-10-06 DIAGNOSIS — I2510 Atherosclerotic heart disease of native coronary artery without angina pectoris: Secondary | ICD-10-CM

## 2012-10-06 DIAGNOSIS — I5022 Chronic systolic (congestive) heart failure: Secondary | ICD-10-CM

## 2012-10-06 MED ORDER — OMEPRAZOLE 20 MG PO CPDR: 20.0000 mg | DELAYED_RELEASE_CAPSULE | Freq: Every day | ORAL | Status: DC

## 2012-10-06 NOTE — Patient Instructions (Addendum)
The current medical regimen is effective;  continue present plan and medications.  Follow up as needed 

## 2012-10-06 NOTE — Progress Notes (Signed)
HPI The patient presents for followup of a nonischemic cardiomyopathy. His last echo actually demonstrated his EF was now normal. He feels very good. He is active working basically 7 days a week. The patient denies any new symptoms such as chest discomfort, neck or arm discomfort. There has been no new shortness of breath, PND or orthopnea. There have been no reported palpitations, presyncope or syncope.  No Known Allergies  Current Outpatient Prescriptions  Medication Sig Dispense Refill  . aspirin 81 MG tablet Take 81 mg by mouth daily.      . furosemide (LASIX) 20 MG tablet Take 1 tablet (20 mg total) by mouth 2 (two) times daily.  60 tablet  4  . glipiZIDE (GLUCOTROL) 10 MG tablet Take 10 mg by mouth 2 (two) times daily before a meal.      . hydrALAZINE (APRESOLINE) 25 MG tablet TAKE 1 TABLET (25 MG TOTAL) BY MOUTH 3 (THREE) TIMES DAILY.  90 tablet  9  . isosorbide mononitrate (IMDUR) 120 MG 24 hr tablet Take 1 tablet (120 mg total) by mouth daily.  30 tablet  11  . lisinopril (PRINIVIL,ZESTRIL) 40 MG tablet Take 1 tablet (40 mg total) by mouth daily.  30 tablet  4  . metFORMIN (GLUCOPHAGE) 1000 MG tablet Take 1,000 mg by mouth 2 (two) times daily with a meal.      . metoprolol succinate (TOPROL-XL) 100 MG 24 hr tablet TAKE 1 TABLET (100 MG TOTAL) BY MOUTH DAILY.  30 tablet  9  . potassium chloride SA (K-DUR,KLOR-CON) 20 MEQ tablet TAKE 1 TABLET DAILY  30 tablet  1  . pravastatin (PRAVACHOL) 20 MG tablet Take 1 tablet (20 mg total) by mouth every evening.  30 tablet  11  . spironolactone (ALDACTONE) 50 MG tablet TAKE 1 TABLET (50 MG TOTAL) BY MOUTH DAILY.  30 tablet  10  . omeprazole (PRILOSEC) 20 MG capsule Take 1 capsule (20 mg total) by mouth daily.  30 capsule  11  . [DISCONTINUED] potassium chloride (KLOR-CON) 20 MEQ packet Take 20 mEq by mouth 2 (two) times daily.       No current facility-administered medications for this visit.    Past Medical History  Diagnosis Date  .  Diabetes mellitus   . COPD (chronic obstructive pulmonary disease)   . Hypertension   . Hyperlipidemia   . GERD (gastroesophageal reflux disease)   . Knee pain   . History of pneumonia   . CAD (coronary artery disease)     LHC 4/13: mD1 25%, oD2 25%, pOM1 25%, pRCA 25%, dRCA 40%, EF 30%  . Chronic systolic heart failure   . NICM (nonischemic cardiomyopathy)     echo 3/13: mild LVH, EF 30-35%, grade 3 diast dysfxn, mod LAE;  RHC 4/13:  RA 8, RV 58/10, PA 56/18, mean 35, PCWP mean 17, LV 154/27, CO 4.3, CI 2.0    Past Surgical History  Procedure Laterality Date  . None      ROS: Mild reflux.  Otherwise as stated in the history of present illness and negative for other systems.  PHYSICAL EXAM BP 124/75  Pulse 67  Ht 5\' 11"  (1.803 m)  Wt 217 lb (98.431 kg)  BMI 30.28 kg/m2 GENERAL:  Well appearing HEENT:  Pupils equal round and reactive, fundi not visualized, oral mucosa unremarkable, partial NECK:  No jugular venous distention, waveform within normal limits, carotid upstroke brisk and symmetric, no bruits, no thyromegaly LYMPHATICS:  No cervical, inguinal adenopathy LUNGS:  Clear to auscultation bilaterally BACK:  No CVA tenderness CHEST:  Unremarkable HEART:  PMI displaced laterally, S1 and S2 within normal limits, positive S3 vs fixed split S2, positiveS4, no clicks, no rubs, no murmurs ABD:  Flat, positive bowel sounds normal in frequency in pitch, no bruits, no rebound, no guarding, no midline pulsatile mass, no hepatomegaly, no splenomegaly EXT:  2 plus pulses throughout, no edema, no cyanosis no clubbing  EKG: Sinus rhythm, rate 67, axis within normal limits, intervals within normal limits, no acute ST-T wave changes.  10/06/2012  ASSESSMENT AND PLAN  Chronic systolic heart failure -  I will continue his current medications. He is euvolemic. His EF is now normal. No change in therapy is indicated.  He will continue the meds as listed.    Hypertension -  This is being  managed in the context of treating his CHF.  The blood pressure is at target. No change in medications is indicated. We will continue with therapeutic lifestyle changes (TLC).

## 2012-10-12 ENCOUNTER — Other Ambulatory Visit: Payer: Self-pay | Admitting: Cardiology

## 2012-10-23 ENCOUNTER — Other Ambulatory Visit: Payer: Self-pay | Admitting: Cardiology

## 2012-11-18 ENCOUNTER — Other Ambulatory Visit: Payer: Self-pay | Admitting: Physician Assistant

## 2012-12-12 ENCOUNTER — Other Ambulatory Visit: Payer: Self-pay | Admitting: Physician Assistant

## 2012-12-16 ENCOUNTER — Inpatient Hospital Stay (HOSPITAL_COMMUNITY)
Admission: EM | Admit: 2012-12-16 | Discharge: 2012-12-17 | DRG: 313 | Disposition: A | Discharge status: Home / Self Care | Payer: BC Managed Care – PPO | Attending: Internal Medicine | Admitting: Internal Medicine

## 2012-12-16 ENCOUNTER — Emergency Department (HOSPITAL_COMMUNITY): Payer: BC Managed Care – PPO

## 2012-12-16 ENCOUNTER — Encounter (HOSPITAL_COMMUNITY): Payer: Self-pay | Admitting: Emergency Medicine

## 2012-12-16 DIAGNOSIS — E875 Hyperkalemia: Secondary | ICD-10-CM | POA: Diagnosis present

## 2012-12-16 DIAGNOSIS — E119 Type 2 diabetes mellitus without complications: Secondary | ICD-10-CM

## 2012-12-16 DIAGNOSIS — E781 Pure hyperglyceridemia: Secondary | ICD-10-CM | POA: Diagnosis present

## 2012-12-16 DIAGNOSIS — I10 Essential (primary) hypertension: Secondary | ICD-10-CM

## 2012-12-16 DIAGNOSIS — R079 Chest pain, unspecified: Secondary | ICD-10-CM

## 2012-12-16 DIAGNOSIS — E1129 Type 2 diabetes mellitus with other diabetic kidney complication: Secondary | ICD-10-CM | POA: Diagnosis present

## 2012-12-16 DIAGNOSIS — N189 Chronic kidney disease, unspecified: Secondary | ICD-10-CM | POA: Diagnosis present

## 2012-12-16 DIAGNOSIS — J449 Chronic obstructive pulmonary disease, unspecified: Secondary | ICD-10-CM | POA: Diagnosis present

## 2012-12-16 DIAGNOSIS — IMO0001 Reserved for inherently not codable concepts without codable children: Secondary | ICD-10-CM | POA: Diagnosis present

## 2012-12-16 DIAGNOSIS — Z79899 Other long term (current) drug therapy: Secondary | ICD-10-CM

## 2012-12-16 DIAGNOSIS — I428 Other cardiomyopathies: Secondary | ICD-10-CM | POA: Diagnosis present

## 2012-12-16 DIAGNOSIS — Z87891 Personal history of nicotine dependence: Secondary | ICD-10-CM

## 2012-12-16 DIAGNOSIS — Z91199 Patient's noncompliance with other medical treatment and regimen due to unspecified reason: Secondary | ICD-10-CM

## 2012-12-16 DIAGNOSIS — Z7982 Long term (current) use of aspirin: Secondary | ICD-10-CM

## 2012-12-16 DIAGNOSIS — J4489 Other specified chronic obstructive pulmonary disease: Secondary | ICD-10-CM | POA: Diagnosis present

## 2012-12-16 DIAGNOSIS — I5022 Chronic systolic (congestive) heart failure: Secondary | ICD-10-CM | POA: Diagnosis present

## 2012-12-16 DIAGNOSIS — I129 Hypertensive chronic kidney disease with stage 1 through stage 4 chronic kidney disease, or unspecified chronic kidney disease: Secondary | ICD-10-CM | POA: Diagnosis present

## 2012-12-16 DIAGNOSIS — I2510 Atherosclerotic heart disease of native coronary artery without angina pectoris: Secondary | ICD-10-CM | POA: Diagnosis present

## 2012-12-16 DIAGNOSIS — Z9119 Patient's noncompliance with other medical treatment and regimen: Secondary | ICD-10-CM

## 2012-12-16 DIAGNOSIS — Z833 Family history of diabetes mellitus: Secondary | ICD-10-CM

## 2012-12-16 DIAGNOSIS — N182 Chronic kidney disease, stage 2 (mild): Secondary | ICD-10-CM | POA: Diagnosis present

## 2012-12-16 DIAGNOSIS — R072 Precordial pain: Secondary | ICD-10-CM | POA: Diagnosis present

## 2012-12-16 DIAGNOSIS — I509 Heart failure, unspecified: Secondary | ICD-10-CM | POA: Diagnosis present

## 2012-12-16 DIAGNOSIS — K219 Gastro-esophageal reflux disease without esophagitis: Secondary | ICD-10-CM | POA: Diagnosis present

## 2012-12-16 HISTORY — DX: Chronic kidney disease, stage 2 (mild): N18.2

## 2012-12-16 HISTORY — DX: Hyperkalemia: E87.5

## 2012-12-16 HISTORY — DX: Pure hyperglyceridemia: E78.1

## 2012-12-16 LAB — RENAL FUNCTION PANEL
Albumin: 3.7 g/dL (ref 3.5–5.2)
BUN: 31 mg/dL — ABNORMAL HIGH (ref 6–23)
Chloride: 100 mEq/L (ref 96–112)
GFR calc Af Amer: 60 mL/min — ABNORMAL LOW (ref >90)
GFR calc non Af Amer: 52 mL/min — ABNORMAL LOW (ref >90)
Glucose, Bld: 319 mg/dL — ABNORMAL HIGH (ref 70–99)
Phosphorus: 3.1 mg/dL (ref 2.3–4.6)
Potassium: 5.5 mEq/L — ABNORMAL HIGH (ref 3.5–5.1)
Sodium: 132 mEq/L — ABNORMAL LOW (ref 135–145)

## 2012-12-16 LAB — BASIC METABOLIC PANEL
CO2: 21 mEq/L (ref 19–32)
CO2: 21 mEq/L (ref 19–32)
Calcium: 10.5 mg/dL (ref 8.4–10.5)
Chloride: 95 mEq/L — ABNORMAL LOW (ref 96–112)
Chloride: 97 mEq/L (ref 96–112)
Creatinine, Ser: 1.57 mg/dL — ABNORMAL HIGH (ref 0.50–1.35)
Creatinine, Ser: 1.58 mg/dL — ABNORMAL HIGH (ref 0.50–1.35)
GFR calc Af Amer: 56 mL/min — ABNORMAL LOW (ref >90)
GFR calc Af Amer: 56 mL/min — ABNORMAL LOW (ref >90)
GFR calc non Af Amer: 48 mL/min — ABNORMAL LOW (ref >90)
Glucose, Bld: 526 mg/dL — ABNORMAL HIGH (ref 70–99)
Potassium: 6.3 mEq/L (ref 3.5–5.1)
Potassium: 7 mEq/L (ref 3.5–5.1)
Sodium: 130 mEq/L — ABNORMAL LOW (ref 135–145)

## 2012-12-16 LAB — CBC WITH DIFFERENTIAL/PLATELET
Basophils Relative: 0 % (ref 0–1)
Eosinophils Absolute: 0.1 10*3/uL (ref 0.0–0.7)
Eosinophils Relative: 1 % (ref 0–5)
Hemoglobin: 12.7 g/dL — ABNORMAL LOW (ref 13.0–17.0)
Lymphocytes Relative: 23 % (ref 12–46)
Lymphs Abs: 2.4 10*3/uL (ref 0.7–4.0)
MCH: 32.4 pg (ref 26.0–34.0)
MCV: 88.8 fL (ref 78.0–100.0)
Monocytes Relative: 5 % (ref 3–12)
Neutro Abs: 7.2 10*3/uL (ref 1.7–7.7)
Platelets: 330 10*3/uL (ref 150–400)
RBC: 3.92 MIL/uL — ABNORMAL LOW (ref 4.22–5.81)
RDW: 13 % (ref 11.5–15.5)
WBC: 10.2 10*3/uL (ref 4.0–10.5)

## 2012-12-16 LAB — POCT I-STAT TROPONIN I: Troponin i, poc: 0.01 ng/mL (ref 0.00–0.08)

## 2012-12-16 LAB — GLUCOSE, CAPILLARY: Glucose-Capillary: 301 mg/dL — ABNORMAL HIGH (ref 70–99)

## 2012-12-16 LAB — PRO B NATRIURETIC PEPTIDE: Pro B Natriuretic peptide (BNP): 690.4 pg/mL — ABNORMAL HIGH (ref 0–125)

## 2012-12-16 MED ORDER — SODIUM CHLORIDE 0.9 % IV BOLUS (SEPSIS)
1000.0000 mL | Freq: Once | INTRAVENOUS | Status: AC
  Administered 2012-12-16: 1000 mL via INTRAVENOUS

## 2012-12-16 MED ORDER — INSULIN ASPART 100 UNIT/ML IV SOLN
5.0000 [IU] | Freq: Once | INTRAVENOUS | Status: AC
  Administered 2012-12-16: 5 [IU] via INTRAVENOUS
  Filled 2012-12-16: qty 0.05

## 2012-12-16 MED ORDER — NITROGLYCERIN 2 % TD OINT
1.0000 [in_us] | TOPICAL_OINTMENT | Freq: Once | TRANSDERMAL | Status: AC
  Administered 2012-12-16: 1 [in_us] via TOPICAL
  Filled 2012-12-16: qty 1

## 2012-12-16 MED ORDER — ASPIRIN 81 MG PO CHEW
243.0000 mg | CHEWABLE_TABLET | Freq: Once | ORAL | Status: AC
  Administered 2012-12-16: 243 mg via ORAL
  Filled 2012-12-16: qty 3

## 2012-12-16 MED ORDER — DEXTROSE 50 % IV SOLN: 1.0000 | Freq: Once | INTRAVENOUS | Status: DC

## 2012-12-16 MED ORDER — FUROSEMIDE 10 MG/ML IJ SOLN
40.0000 mg | Freq: Once | INTRAMUSCULAR | Status: AC
  Administered 2012-12-16: 40 mg via INTRAVENOUS
  Filled 2012-12-16: qty 4

## 2012-12-16 MED ORDER — SODIUM POLYSTYRENE SULFONATE 15 GM/60ML PO SUSP
30.0000 g | Freq: Once | ORAL | Status: AC
Start: 2012-12-16 — End: 2012-12-16
  Administered 2012-12-16: 30 g via ORAL
  Filled 2012-12-16: qty 120

## 2012-12-16 NOTE — ED Notes (Signed)
EDP Docherty notified of critical potasium level of 6.3.

## 2012-12-16 NOTE — ED Notes (Signed)
Patient presents with c/o upper left chest pain that radiates into his arms.  +SOB, lightheadedness  This started about 2-3 weeks ago but has gotten worse.  Thought it may be coming from juice he was drinking

## 2012-12-16 NOTE — ED Provider Notes (Signed)
CSN: [id number]     Arrival date & time 12/16/12  1751 History   First MD Initiated Contact with Patient 12/16/12 1807     Chief Complaint  Patient presents with  . Chest Pain  . Shortness of Breath   (Consider location/radiation/quality/duration/timing/severity/associated sxs/prior Treatment) Patient is a 52 y.o. male presenting with chest pain. The history is provided by the patient and the spouse. No language interpreter was used.  Chest Pain Pain location:  Substernal area Pain quality: sharp   Pain radiates to:  L arm Pain severity:  Moderate Onset quality:  Sudden Duration:  1 month Timing:  Intermittent Progression:  Waxing and waning Chronicity:  New Context comment:  Sometimes at rest, sometimes w/ exertion Relieved by:  Rest Worsened by:  Nothing tried Ineffective treatments:  None tried Associated symptoms: diaphoresis, near-syncope and shortness of breath   Associated symptoms: no abdominal pain, no back pain, no cough, no dizziness, no dysphagia, no fatigue, no fever, no headache, no nausea, no numbness, not vomiting and no weakness   Shortness of breath:    Severity:  Moderate   Onset quality:  Sudden   Timing:  Intermittent Risk factors: coronary artery disease, diabetes mellitus, high cholesterol, hypertension, male sex and smoking (former)   Risk factors: no prior DVT/PE     Past Medical History  Diagnosis Date  . Diabetes mellitus   . COPD (chronic obstructive pulmonary disease)   . Hypertension   . Hyperlipidemia   . GERD (gastroesophageal reflux disease)   . Knee pain   . History of pneumonia   . CAD (coronary artery disease)     LHC 4/13: mD1 25%, oD2 25%, pOM1 25%, pRCA 25%, dRCA 40%, EF 30%  . Chronic systolic heart failure   . NICM (nonischemic cardiomyopathy)     echo 3/13: mild LVH, EF 30-35%, grade 3 diast dysfxn, mod LAE;  RHC 4/13:  RA 8, RV 58/10, PA 56/18, mean 35, PCWP mean 17, LV 154/27, CO 4.3, CI 2.0.  EF 60% 12/13  . CHF (congestive  heart failure)   . CKD (chronic kidney disease), stage II    Past Surgical History  Procedure Laterality Date  . None     Family History  Problem Relation Age of Onset  . Diabetes Sister   . Breast cancer Mother   . Breast cancer Maternal Grandmother    History  Substance Use Topics  . Smoking status: Former Smoker -- 1.00 packs/day for 20 years    Types: Cigarettes    Quit date: 09/15/2006  . Smokeless tobacco: Never Used  . Alcohol Use: Yes     Comment: occ    Review of Systems  Constitutional: Positive for diaphoresis. Negative for fever, activity change, appetite change and fatigue.  HENT: Negative for congestion, facial swelling, rhinorrhea and trouble swallowing.   Eyes: Negative for photophobia and pain.  Respiratory: Positive for shortness of breath. Negative for cough and chest tightness.   Cardiovascular: Positive for chest pain and near-syncope. Negative for leg swelling.  Gastrointestinal: Negative for nausea, vomiting, abdominal pain, diarrhea and constipation.  Endocrine: Negative for polydipsia and polyuria.  Genitourinary: Negative for dysuria, urgency, decreased urine volume and difficulty urinating.  Musculoskeletal: Negative for back pain and gait problem.  Skin: Negative for color change, rash and wound.  Allergic/Immunologic: Negative for immunocompromised state.  Neurological: Negative for dizziness, facial asymmetry, speech difficulty, weakness, numbness and headaches.  Psychiatric/Behavioral: Negative for confusion, decreased concentration and agitation.    Allergies  Review of patient's allergies indicates no known allergies.  Home Medications   No current outpatient prescriptions on file. BP 108/64  Pulse 79  Temp(Src) 98.3 F (36.8 C) (Oral)  Resp 18  Ht 5\' 11"  (1.803 m)  Wt 204 lb 8 oz (92.761 kg)  BMI 28.53 kg/m2  SpO2 95% Physical Exam  Constitutional: He is oriented to person, place, and time. He appears well-developed and  well-nourished. No distress.  HENT:  Head: Normocephalic and atraumatic.  Mouth/Throat: No oropharyngeal exudate.  Eyes: Pupils are equal, round, and reactive to light.  Neck: Normal range of motion. Neck supple.  Cardiovascular: Normal rate, regular rhythm and normal heart sounds.  Exam reveals no gallop and no friction rub.   No murmur heard. Pulmonary/Chest: Effort normal and breath sounds normal. No respiratory distress. He has no wheezes. He has no rales.  Abdominal: Soft. Bowel sounds are normal. He exhibits no distension and no mass. There is no tenderness. There is no rebound and no guarding.  Musculoskeletal: Normal range of motion. He exhibits no edema and no tenderness.  Neurological: He is alert and oriented to person, place, and time.  Skin: Skin is warm and dry.  Psychiatric: He has a normal mood and affect.    ED Course  Procedures (including critical care time) Labs Review Labs Reviewed  CBC WITH DIFFERENTIAL - Abnormal; Notable for the following:    RBC 3.92 (*)    Hemoglobin 12.7 (*)    HCT 34.8 (*)    MCHC 36.5 (*)    All other components within normal limits  PRO B NATRIURETIC PEPTIDE - Abnormal; Notable for the following:    Pro B Natriuretic peptide (BNP) 690.4 (*)    All other components within normal limits  BASIC METABOLIC PANEL - Abnormal; Notable for the following:    Sodium 130 (*)    Potassium 6.3 (*)    Chloride 95 (*)    Glucose, Bld 526 (*)    BUN 33 (*)    Creatinine, Ser 1.57 (*)    GFR calc non Af Amer 48 (*)    GFR calc Af Amer 56 (*)    All other components within normal limits  BASIC METABOLIC PANEL - Abnormal; Notable for the following:    Sodium 129 (*)    Potassium 7.0 (*)    Glucose, Bld 410 (*)    BUN 33 (*)    Creatinine, Ser 1.58 (*)    GFR calc non Af Amer 48 (*)    GFR calc Af Amer 56 (*)    All other components within normal limits  GLUCOSE, CAPILLARY - Abnormal; Notable for the following:    Glucose-Capillary 301 (*)     All other components within normal limits  RENAL FUNCTION PANEL - Abnormal; Notable for the following:    Sodium 132 (*)    Potassium 5.5 (*)    Glucose, Bld 319 (*)    BUN 31 (*)    Creatinine, Ser 1.49 (*)    GFR calc non Af Amer 52 (*)    GFR calc Af Amer 60 (*)    All other components within normal limits  RENAL FUNCTION PANEL - Abnormal; Notable for the following:    Sodium 134 (*)    Glucose, Bld 361 (*)    BUN 30 (*)    Creatinine, Ser 1.63 (*)    GFR calc non Af Amer 46 (*)    GFR calc Af Amer 54 (*)  All other components within normal limits  RENAL FUNCTION PANEL - Abnormal; Notable for the following:    Glucose, Bld 372 (*)    BUN 31 (*)    Creatinine, Ser 1.65 (*)    GFR calc non Af Amer 46 (*)    GFR calc Af Amer 53 (*)    All other components within normal limits  LIPID PANEL - Abnormal; Notable for the following:    Cholesterol 276 (*)    Triglycerides 1102 (*)    All other components within normal limits  CBC - Abnormal; Notable for the following:    WBC 10.8 (*)    RBC 3.91 (*)    Hemoglobin 12.6 (*)    HCT 34.8 (*)    MCHC 36.2 (*)    All other components within normal limits  CREATININE, SERUM - Abnormal; Notable for the following:    Creatinine, Ser 1.63 (*)    GFR calc non Af Amer 46 (*)    GFR calc Af Amer 54 (*)    All other components within normal limits  GLUCOSE, CAPILLARY - Abnormal; Notable for the following:    Glucose-Capillary 288 (*)    All other components within normal limits  GLUCOSE, CAPILLARY - Abnormal; Notable for the following:    Glucose-Capillary 341 (*)    All other components within normal limits  TROPONIN I  RENAL FUNCTION PANEL  RENAL FUNCTION PANEL  RENAL FUNCTION PANEL  RENAL FUNCTION PANEL  TROPONIN I  TROPONIN I  HEMOGLOBIN A1C  POCT I-STAT TROPONIN I   Imaging Review Dg Chest 2 View  12/16/2012   CLINICAL DATA:  Chest pain. Shortness of breath. Diabetes and hypertension.  EXAM: CHEST  2 VIEW  COMPARISON:   04/29/2011  FINDINGS: The heart size and mediastinal contours are within normal limits. Both lungs are clear. The visualized skeletal structures are unremarkable.  IMPRESSION: No active cardiopulmonary disease.   Electronically Signed   By: Earle Gell M.D.   On: 12/16/2012 19:04    EKG Interpretation     Ventricular Rate:  77 PR Interval:  192 QRS Duration: 80 QT Interval:  360 QTC Calculation: 407 R Axis:   43 Text Interpretation:  Normal sinus rhythm T wave amplitude has increased in V2, V3, V4 Probably L atrial abnormality            MDM   1. Chest pain   2. Hyperkalemia   3. Diabetes mellitus   4. HTN (hypertension)   5. Precordial chest pain   6. Hypertension    Pt is a 52 y.o. male with Pmhx as above including nonobstructive CAD, NICM and  who presents with 1 month of intermittent CP with assoc SOB, diaphoresis, radiation to L arm, now more frequent since yesterday and now also assoc w/ lightheadedness.  CP resolved about 1 hr prior to presentation.  VSS, pt in NAD.  Cardiopulm exam benign. Prominent T waves on EKG.  ASA, NTG paste given. Pt found to have potassium elevation which is c/w EKG. Cr near baseline. Pt on potassium supplementation which may be cause.  Pt temporized, lasix, kayexalate given.  Trop & CXR negative.  Cardiology consulted, will admit to their service for ACS r/o.         Neta Ehlers, MD 12/17/12 1041

## 2012-12-16 NOTE — ED Notes (Signed)
Patient transported to X-ray 

## 2012-12-16 NOTE — ED Notes (Signed)
Pt reported that he had chest pain "10 minutes ago" which passed after 5 minutes. Pt has no chest pain at this time. NAD at this time.

## 2012-12-17 ENCOUNTER — Encounter (HOSPITAL_COMMUNITY): Payer: Self-pay | Admitting: Physician Assistant

## 2012-12-17 ENCOUNTER — Other Ambulatory Visit: Payer: Self-pay | Admitting: Physician Assistant

## 2012-12-17 ENCOUNTER — Inpatient Hospital Stay (HOSPITAL_COMMUNITY): Payer: BC Managed Care – PPO

## 2012-12-17 ENCOUNTER — Other Ambulatory Visit (HOSPITAL_COMMUNITY): Payer: BC Managed Care – PPO

## 2012-12-17 DIAGNOSIS — N289 Disorder of kidney and ureter, unspecified: Secondary | ICD-10-CM | POA: Insufficient documentation

## 2012-12-17 DIAGNOSIS — R079 Chest pain, unspecified: Secondary | ICD-10-CM

## 2012-12-17 DIAGNOSIS — E781 Pure hyperglyceridemia: Secondary | ICD-10-CM | POA: Diagnosis present

## 2012-12-17 DIAGNOSIS — N189 Chronic kidney disease, unspecified: Secondary | ICD-10-CM | POA: Diagnosis present

## 2012-12-17 DIAGNOSIS — I10 Essential (primary) hypertension: Secondary | ICD-10-CM

## 2012-12-17 DIAGNOSIS — R072 Precordial pain: Principal | ICD-10-CM

## 2012-12-17 DIAGNOSIS — E875 Hyperkalemia: Secondary | ICD-10-CM

## 2012-12-17 LAB — HEPATIC FUNCTION PANEL
ALT: 19 U/L (ref 0–53)
AST: 15 U/L (ref 0–37)
Albumin: 3.7 g/dL (ref 3.5–5.2)
Alkaline Phosphatase: 113 U/L (ref 39–117)
Indirect Bilirubin: 0.2 mg/dL — ABNORMAL LOW (ref 0.3–0.9)
Total Bilirubin: 0.4 mg/dL (ref 0.3–1.2)
Total Protein: 7.3 g/dL (ref 6.0–8.3)

## 2012-12-17 LAB — RENAL FUNCTION PANEL
Albumin: 3.7 g/dL (ref 3.5–5.2)
Albumin: 3.7 g/dL (ref 3.5–5.2)
BUN: 30 mg/dL — ABNORMAL HIGH (ref 6–23)
BUN: 32 mg/dL — ABNORMAL HIGH (ref 6–23)
CO2: 22 meq/L (ref 19–32)
CO2: 21 mEq/L (ref 19–32)
CO2: 18 meq/L — ABNORMAL LOW (ref 19–32)
Calcium: 9.9 mg/dL (ref 8.4–10.5)
Calcium: 9.5 mg/dL (ref 8.4–10.5)
Calcium: 9.9 mg/dL (ref 8.4–10.5)
Chloride: 99 meq/L (ref 96–112)
Chloride: 97 meq/L (ref 96–112)
Creatinine, Ser: 1.63 mg/dL — ABNORMAL HIGH (ref 0.50–1.35)
Creatinine, Ser: 1.65 mg/dL — ABNORMAL HIGH (ref 0.50–1.35)
Creatinine, Ser: 1.72 mg/dL — ABNORMAL HIGH (ref 0.50–1.35)
GFR calc Af Amer: 54 mL/min — ABNORMAL LOW
GFR calc Af Amer: 50 mL/min — ABNORMAL LOW
GFR calc non Af Amer: 46 mL/min — ABNORMAL LOW
GFR calc non Af Amer: 46 mL/min — ABNORMAL LOW (ref >90)
GFR calc non Af Amer: 43 mL/min — ABNORMAL LOW
Glucose, Bld: 361 mg/dL — ABNORMAL HIGH (ref 70–99)
Glucose, Bld: 372 mg/dL — ABNORMAL HIGH (ref 70–99)
Glucose, Bld: 392 mg/dL — ABNORMAL HIGH (ref 70–99)
Phosphorus: 4.1 mg/dL (ref 2.3–4.6)
Phosphorus: 3.7 mg/dL (ref 2.3–4.6)
Phosphorus: 3.9 mg/dL (ref 2.3–4.6)
Potassium: 5 meq/L (ref 3.5–5.1)
Potassium: 4.5 meq/L (ref 3.5–5.1)
Sodium: 134 meq/L — ABNORMAL LOW (ref 135–145)
Sodium: 135 mEq/L (ref 135–145)
Sodium: 132 meq/L — ABNORMAL LOW (ref 135–145)

## 2012-12-17 LAB — CBC
HCT: 34.8 % — ABNORMAL LOW (ref 39.0–52.0)
Hemoglobin: 12.6 g/dL — ABNORMAL LOW (ref 13.0–17.0)
MCH: 32.2 pg (ref 26.0–34.0)
MCHC: 36.2 g/dL — ABNORMAL HIGH (ref 30.0–36.0)
MCV: 89 fL (ref 78.0–100.0)
Platelets: 293 K/uL (ref 150–400)
RBC: 3.91 MIL/uL — ABNORMAL LOW (ref 4.22–5.81)
RDW: 13 % (ref 11.5–15.5)
WBC: 10.8 K/uL — ABNORMAL HIGH (ref 4.0–10.5)

## 2012-12-17 LAB — TROPONIN I: Troponin I: <0.3 ng/mL (ref <0.30)

## 2012-12-17 LAB — LIPID PANEL
Cholesterol: 276 mg/dL — ABNORMAL HIGH (ref 0–200)
HDL: 42 mg/dL
LDL Cholesterol: UNDETERMINED mg/dL (ref 0–99)
Total CHOL/HDL Ratio: 6.6 ratio
Triglycerides: 1102 mg/dL — ABNORMAL HIGH
VLDL: UNDETERMINED mg/dL (ref 0–40)

## 2012-12-17 LAB — CREATININE, SERUM
GFR calc Af Amer: 54 mL/min — ABNORMAL LOW (ref >90)
GFR calc non Af Amer: 46 mL/min — ABNORMAL LOW (ref >90)

## 2012-12-17 LAB — HEMOGLOBIN A1C
Hgb A1c MFr Bld: 14.8 % — ABNORMAL HIGH
Mean Plasma Glucose: 378 mg/dL — ABNORMAL HIGH

## 2012-12-17 MED ORDER — SIMVASTATIN 20 MG PO TABS
20.0000 mg | ORAL_TABLET | Freq: Every day | ORAL | Status: DC
  Filled 2012-12-17: qty 1

## 2012-12-17 MED ORDER — METFORMIN HCL 500 MG PO TABS
1000.0000 mg | ORAL_TABLET | Freq: Two times a day (BID) | ORAL | Status: DC
  Filled 2012-12-17 (×3): qty 2

## 2012-12-17 MED ORDER — INFLUENZA VAC SPLIT QUAD 0.5 ML IM SUSP: 0.5000 mL | INTRAMUSCULAR | Status: DC

## 2012-12-17 MED ORDER — TECHNETIUM TC 99M SESTAMIBI GENERIC - CARDIOLITE
10.0000 | Freq: Once | INTRAVENOUS | Status: AC | PRN
  Administered 2012-12-17: 10 via INTRAVENOUS

## 2012-12-17 MED ORDER — ONDANSETRON HCL 4 MG/2ML IJ SOLN: 4.0000 mg | Freq: Four times a day (QID) | INTRAMUSCULAR | Status: DC | PRN

## 2012-12-17 MED ORDER — METOPROLOL SUCCINATE ER 100 MG PO TB24
100.0000 mg | ORAL_TABLET | Freq: Every day | ORAL | Status: DC
  Filled 2012-12-17: qty 1

## 2012-12-17 MED ORDER — FENOFIBRATE 48 MG PO TABS: 48.0000 mg | ORAL_TABLET | Freq: Every day | ORAL | Status: DC

## 2012-12-17 MED ORDER — TECHNETIUM TC 99M SESTAMIBI - CARDIOLITE
30.0000 | Freq: Once | INTRAVENOUS | Status: AC | PRN
  Administered 2012-12-17: 12:00:00 30 via INTRAVENOUS

## 2012-12-17 MED ORDER — ACETAMINOPHEN 325 MG PO TABS: 650.0000 mg | ORAL_TABLET | ORAL | Status: DC | PRN

## 2012-12-17 MED ORDER — SPIRONOLACTONE 50 MG PO TABS
50.0000 mg | ORAL_TABLET | Freq: Every day | ORAL | Status: DC
  Filled 2012-12-17: qty 1

## 2012-12-17 MED ORDER — ASPIRIN 81 MG PO CHEW: 81.0000 mg | CHEWABLE_TABLET | Freq: Every day | ORAL | Status: DC

## 2012-12-17 MED ORDER — GLIPIZIDE 10 MG PO TABS
10.0000 mg | ORAL_TABLET | Freq: Two times a day (BID) | ORAL | Status: DC
  Administered 2012-12-17: 10 mg via ORAL
  Filled 2012-12-17 (×3): qty 1

## 2012-12-17 MED ORDER — PANTOPRAZOLE SODIUM 40 MG PO TBEC
40.0000 mg | DELAYED_RELEASE_TABLET | Freq: Every day | ORAL | Status: DC
  Administered 2012-12-17: 40 mg via ORAL

## 2012-12-17 MED ORDER — LISINOPRIL 40 MG PO TABS
40.0000 mg | ORAL_TABLET | Freq: Every day | ORAL | Status: DC
  Filled 2012-12-17: qty 1

## 2012-12-17 MED ORDER — FUROSEMIDE 20 MG PO TABS
20.0000 mg | ORAL_TABLET | Freq: Every day | ORAL | Status: DC | PRN
  Filled 2012-12-17: qty 1

## 2012-12-17 MED ORDER — REGADENOSON 0.4 MG/5ML IV SOLN
INTRAVENOUS | Status: AC
  Administered 2012-12-17: 10:00:00
  Filled 2012-12-17: qty 5

## 2012-12-17 MED ORDER — INSULIN ASPART 100 UNIT/ML ~~LOC~~ SOLN
0.0000 [IU] | Freq: Three times a day (TID) | SUBCUTANEOUS | Status: DC
  Administered 2012-12-17: 7 [IU] via SUBCUTANEOUS

## 2012-12-17 MED ORDER — HYDRALAZINE HCL 25 MG PO TABS
25.0000 mg | ORAL_TABLET | Freq: Three times a day (TID) | ORAL | Status: DC
  Administered 2012-12-17: 25 mg via ORAL
  Filled 2012-12-17 (×3): qty 1

## 2012-12-17 MED ORDER — NITROGLYCERIN 0.4 MG SL SUBL: 0.4000 mg | SUBLINGUAL_TABLET | SUBLINGUAL | Status: DC | PRN

## 2012-12-17 MED ORDER — ISOSORBIDE MONONITRATE ER 60 MG PO TB24
120.0000 mg | ORAL_TABLET | Freq: Every day | ORAL | Status: DC
  Administered 2012-12-17: 120 mg via ORAL
  Filled 2012-12-17: qty 2

## 2012-12-17 MED ORDER — ENOXAPARIN SODIUM 40 MG/0.4ML ~~LOC~~ SOLN
40.0000 mg | SUBCUTANEOUS | Status: DC
  Administered 2012-12-17: 40 mg via SUBCUTANEOUS
  Filled 2012-12-17 (×2): qty 0.4

## 2012-12-17 NOTE — H&P (Addendum)
History and physical Note  Patient ID: Roberto Allen, MRN: [id number], DOB/AGE: 54-14-1962 54 y.o. Admit date: 12/16/2012   Date of Consult: 12/17/2012 Primary Physician: Vickii Penna., MD   Chief Complaint: chest pain  Reason for Consult: pre cordial chest pain   HPI: 52 male with hx of DM , COPD , HTN , CAD ( per [facility] in 2013 showing non obstructive disease) here with chest pain   HPI pt states that the pain is substernal sharp  In nature , occasional radiation to left arm associated with SOB and diaphoresis , lightheadedness and exertion ongoing for the past 2-4 weeks. The discomfort does improve with rest. Pt cannot recall if this was the pain he was worked up for in the past . He has no orthopnea, PND , LE edema, syncope, focal weakness , bleeding diathesis , claudication etc.  Reports medication compliance.    Past Medical History  Diagnosis Date  . Diabetes mellitus   . COPD (chronic obstructive pulmonary disease)   . Hypertension   . Hyperlipidemia   . GERD (gastroesophageal reflux disease)   . Knee pain   . History of pneumonia   . CAD (coronary artery disease)     LHC 4/13: mD1 25%, oD2 25%, pOM1 25%, pRCA 25%, dRCA 40%, EF 30%  . Chronic systolic heart failure   . NICM (nonischemic cardiomyopathy)     echo 3/13: mild LVH, EF 30-35%, grade 3 diast dysfxn, mod LAE;  RHC 4/13:  RA 8, RV 58/10, PA 56/18, mean 35, PCWP mean 17, LV 154/27, CO 4.3, CI 2.0.  EF 60% 12/13  . CHF (congestive heart failure)       Most Recent Cardiac Studies:    Surgical History:  Past Surgical History  Procedure Laterality Date  . None       Home Meds: Prior to Admission medications   Medication Sig Start Date End Date Taking? Authorizing Provider  aspirin 81 MG tablet Take 81 mg by mouth daily.   Yes Historical Provider, MD  furosemide (LASIX) 20 MG tablet Take 20 mg by mouth daily as needed for fluid.   Yes Historical Provider, MD  glipiZIDE (GLUCOTROL) 10 MG tablet Take 10 mg by  mouth 2 (two) times daily before a meal.   Yes Historical Provider, MD  hydrALAZINE (APRESOLINE) 25 MG tablet Take 25 mg by mouth 3 (three) times daily.   Yes Historical Provider, MD  isosorbide mononitrate (IMDUR) 120 MG 24 hr tablet Take 1 tablet (120 mg total) by mouth daily. 04/25/12 04/25/13 Yes [name], MD  lisinopril (PRINIVIL,ZESTRIL) 40 MG tablet Take 1 tablet (40 mg total) by mouth daily. 04/25/12  Yes [name], MD  metFORMIN (GLUCOPHAGE) 1000 MG tablet Take 1,000 mg by mouth 2 (two) times daily with a meal.   Yes Historical Provider, MD  metoprolol succinate (TOPROL-XL) 100 MG 24 hr tablet Take 100 mg by mouth daily. Take with or immediately following a meal.   Yes Historical Provider, MD  omeprazole (PRILOSEC) 20 MG capsule Take 1 capsule (20 mg total) by mouth daily. 10/06/12 10/06/13 Yes [name], MD  potassium chloride SA (K-DUR,KLOR-CON) 20 MEQ tablet Take 20 mEq by mouth daily.   Yes Historical Provider, MD  pravastatin (PRAVACHOL) 20 MG tablet Take 20 mg by mouth daily.   Yes Historical Provider, MD  spironolactone (ALDACTONE) 50 MG tablet Take 50 mg by mouth daily.   Yes Historical Provider, MD    Inpatient Medications:  . aspirin  81 mg Oral Daily  . enoxaparin (LOVENOX) injection  40 mg Subcutaneous Q24H  . glipiZIDE  10 mg Oral BID AC  . hydrALAZINE  25 mg Oral TID  . [START ON 12/18/2012] influenza vac split quadrivalent PF  0.5 mL Intramuscular Tomorrow-1000  . isosorbide mononitrate  120 mg Oral Daily  . lisinopril  40 mg Oral Daily  . metFORMIN  1,000 mg Oral BID WC  . metoprolol succinate  100 mg Oral Daily  . pantoprazole  40 mg Oral Daily  . simvastatin  20 mg Oral q1800  . spironolactone  50 mg Oral Daily      Allergies: No Known Allergies  History   Social History  . Marital Status: Married    Spouse Name: N/A    Number of Children: 6  . Years of Education: N/A   Occupational History  . [employer]     Dance movement psychotherapist  .  [place]    Social History Main Topics  . Smoking status: Former Smoker -- 1.00 packs/day for 20 years    Types: Cigarettes    Quit date: 09/15/2006  . Smokeless tobacco: Never Used  . Alcohol Use: Yes     Comment: occ  . Drug Use: No  . Sexual Activity: Yes   Other Topics Concern  . Not on file   Social History Narrative   Lives with wife.     Family History  Problem Relation Age of Onset  . Diabetes Sister   . Breast cancer Mother   . Breast cancer Maternal Grandmother      Review of Systems: per HPI otherwise  General: negative for chills, fever, night sweats or weight changes.  Dermatological: negative for rash Respiratory: negative for cough or wheezing Urologic: negative for hematuria Abdominal: negative for nausea, vomiting, diarrhea, bright red blood per rectum, melena, or hematemesis Neurologic: negative for visual changes, syncope, or dizziness All other systems reviewed and are otherwise negative except as noted above.  Labs:  Recent Labs  12/17/12 0145  TROPONINI <0.30   Lab Results  Component Value Date   WBC 10.8* 12/17/2012   HGB 12.6* 12/17/2012   HCT 34.8* 12/17/2012   MCV 89.0 12/17/2012   PLT 293 12/17/2012    Recent Labs Lab 12/17/12 0145  NA 134*  K 5.0  CL 99  CO2 22  BUN 30*  CREATININE 1.63*  1.63*  CALCIUM 9.9  GLUCOSE 361*   Lab Results  Component Value Date   CHOL 276* 12/17/2012   HDL PENDING 12/17/2012   LDLCALC PENDING 12/17/2012   TRIG 1102* 12/17/2012   No results found for this basename: DDIMER    Radiology/Studies:  Dg Chest 2 View  12/16/2012   .  IMPRESSION: No active cardiopulmonary disease.   Electronically Signed   By: Earle Gell M.D.   On: 12/16/2012 19:04    EKG:  NSR   Physical Exam: Blood pressure 108/64, pulse 79, temperature 98.3 F (36.8 C), temperature source Oral, resp. rate 18, height 5\' 11"  (1.803 m), weight 92.761 kg (204 lb 8 oz), SpO2 95.00%. General: Well developed, well nourished, in no acute  distress. Head: Normocephalic, atraumatic, sclera non-icteric, no xanthomas, nares are without discharge.  Neck: Negative for carotid bruits. JVD not elevated. Lungs: Clear bilaterally to auscultation without wheezes, rales, or rhonchi. Breathing is unlabored. Heart: RRR with S1 S2. No murmurs, rubs, or gallops appreciated. Abdomen: Soft, non-tender, non-distended with normoactive bowel sounds. No hepatomegaly. No rebound/guarding. No obvious abdominal masses.  Msk:  Strength and tone appear normal for age. Extremities: No clubbing or cyanosis. No edema.  Distal pedal pulses are 2+ and equal bilaterally. Neuro: Alert and oriented X 3. No facial asymmetry. No focal deficit. Moves all extremities spontaneously. Psych:  Responds to questions appropriately with a normal affect.     Assessment and Plan:  Precordial chest pain  Hyperkalemia  HTN  CKD stage II   Plan  Monitor renal panel for K . Give kayexalate  Plan for stress test in am  Cont aspirin , statin ,  Hold b- blocker for stress test   Signed, Giah Fickett, A PA-C 12/17/2012, 6:32 AM

## 2012-12-17 NOTE — Progress Notes (Signed)
Utilization Review Completed.Roberto Allen T11/03/2012  

## 2012-12-17 NOTE — Discharge Summary (Signed)
Discharge Summary   Patient ID: Roberto Allen MRN: [id number], DOB/AGE: 54-Feb-1962 54 y.o. Admit date: 12/16/2012 D/C date:     12/17/2012  Primary Cardiologist: Hochrein  Primary Discharge Diagnoses:  1. Precordial chest pain, noncardiac - ruled out for MI, nuclear stress test normal this admission 2. Nonobstructive CAD 2013 3. Chronic systolic CHF due to NICM - EF previously 30-35% 04/2011 then 55-60% 01/2012 4. Acute on chronic renal insufficiency (likely stage II-III at baseline) 5. Hyperkalemia 6. Marked hypertriglyceridemia  7. Diabetes mellitus, uncontrolled  Secondary Discharge Diagnoses:  1. COPD 2. GERD 3. Knee pain 4. History of pneumonia 5. Hyperlipidemia  Hospital Course: Roberto Allen is a 52 y/o M with history of DM, HTN, HL, CKD ~stage II. nonobstructive CAD by cath in 0000000, prior systolic CHF/NICM with EF 30-35% in 04/2011 then improved to 55-60% in 01/2012. He presented to [facility] yesterday with chest pain. He reported the pain to be substernal, sharp, with radiation to left arm associated with SOB and diaphoresis, and lightheadedness for the past 2-4 weeks. The discomfort does improve with rest. He denied any orthopnea, PND, LE edema, syncope, focal weakness, bleeding diathesis, claudication. He reported noncompliance with diabetic medicines due to cost and was found to be markedly hyperglycemic with glucose of 526 on admission with associated hyperkalemia and likely pseudohyponatremia. He also had evidence of acute on chronic kidney insufficiency He did report compliance with his blood pressure medicines except to say he was more recently taking his Lisinopril, Spironolactone, and Toprol at night due to hypotension/dizziness while at work if he takes them in the morning. He was treated with insulin and kayexalate with normalization of his potassium. KCl and spironolactone were discontinued. Troponins remained negative and ECG was nonacute. He underwent nuclear  stress testing today - initially this was an exercise test which he tolerated well but he was unable to reach target HR due to having taken his Toprol at home the night before. He subsequently was changed to a Lexiscan test which was normal - no evidence for pharmacological induced ischemia, calculated ejection fraction is 59% with normal wall motion. He was not tachycardic, tachypnic or hypoxic this admission. The patient feels well today. Dr. Ron Parker has seen and examined the patient today and feels he is stable for discharge.  A few issues for followup -  1) The patient's metformin was discontinued this admission given contraindication in patients with renal failure. Glipizide was resumed (was not taking as outpatient). He was instructed on the importance of calling his PCP TOMORROW (Monday) to discuss this change in his regimen. He was educated on consequences of uncontrolled DM. 2) Spironolactone and KCl will remain on hold for now. Will continue ACEI but will have him f/u in our office this week for a BMET. Suspect part of his hyperkalemia was influenced by marked hyperglycemia. I have left a message on our office's scheduling voicemail requesting the lab & a follow-up appointment, and our office will call the patient with this appointment. 3) He was started on fenofibrate for marked triglyceridemia (1102). Given impaired renal function will start at 48mg  per day but this can be titrated as an outpatient.  Discharge Vitals: Blood pressure 102/68, pulse 94, temperature 98.4 F (36.9 C), temperature source Oral, resp. rate 16, height 5\' 11"  (1.803 m), weight 204 lb 8 oz (92.761 kg), SpO2 100.00%.  Labs: Lab Results  Component Value Date   WBC 10.8* 12/17/2012   HGB 12.6* 12/17/2012   HCT 34.8* 12/17/2012  MCV 89.0 12/17/2012   PLT 293 12/17/2012     Recent Labs Lab 12/17/12 0710  NA 135  K 4.8  CL 101  CO2 21  BUN 31*  CREATININE 1.65*  CALCIUM 9.5  GLUCOSE 372*    Recent Labs   12/17/12 0145 12/17/12 1200  TROPONINI <0.30 <0.30    Diagnostic Studies/Procedures   Dg Chest 2 View  12/16/2012   CLINICAL DATA:  Chest pain. Shortness of breath. Diabetes and hypertension.  EXAM: CHEST  2 VIEW  COMPARISON:  04/29/2011  FINDINGS: The heart size and mediastinal contours are within normal limits. Both lungs are clear. The visualized skeletal structures are unremarkable.  IMPRESSION: No active cardiopulmonary disease.   Electronically Signed   By: Earle Gell M.D.   On: 12/16/2012 19:04   Nm Myocar Multi W/spect W/wall Motion / Ef  12/17/2012   CLINICAL DATA:  52 year old with chest pain.  EXAM: MYOCARDIAL IMAGING WITH SPECT (REST AND PHARMACOLOGIC-STRESS)  GATED LEFT VENTRICULAR WALL MOTION STUDY  LEFT VENTRICULAR EJECTION FRACTION  TECHNIQUE: Standard myocardial SPECT imaging performed after resting intravenous injection of 10 mCi Tc-72m sestimibi. Subsequently, intravenous infusion of regadenoson performed under the supervision of the Cardiology staff. At peak effect of the drug, 30 mCi Tc-83m sestimibi injected intravenously and standard myocardial SPECT imaging performed. Quantitative gated imaging also performed to evaluate left ventricular wall motion and estimate left ventricular ejection fraction.  FINDINGS: MYOCARDIAL IMAGING WITH SPECT (REST AND PHARMACOLOGIC-STRESS)  The left ventricle myocardial perfusion is normal on the stress images. There is no evidence for a fixed or reversible defect.  GATED LEFT VENTRICULAR WALL MOTION STUDY  Review of the gated images demonstrates normal wall motion.  LEFT VENTRICULAR EJECTION FRACTION  QGS ejection fraction measures 59% , with an end-diastolic volume of 73 ml and an end-systolic volume of 30 ml.  IMPRESSION: Normal myocardial perfusion examination. No evidence for pharmacological induced ischemia.  Calculated ejection fraction is 59% with normal wall motion.   Electronically Signed   By: Markus Daft M.D.   On: 12/17/2012 13:14     Discharge Medications     Medication List    STOP taking these medications       metFORMIN 1000 MG tablet  Commonly known as:  GLUCOPHAGE     potassium chloride SA 20 MEQ tablet  Commonly known as:  K-DUR,KLOR-CON     spironolactone 50 MG tablet  Commonly known as:  ALDACTONE      TAKE these medications       aspirin 81 MG tablet  Take 81 mg by mouth daily.     fenofibrate 48 MG tablet  Commonly known as:  TRICOR  Take 1 tablet (48 mg total) by mouth daily.     furosemide 20 MG tablet  Commonly known as:  LASIX  Take 20 mg by mouth daily as needed for fluid.     glipiZIDE 10 MG tablet  Commonly known as:  GLUCOTROL  Take 10 mg by mouth 2 (two) times daily before a meal.     hydrALAZINE 25 MG tablet  Commonly known as:  APRESOLINE  Take 25 mg by mouth 3 (three) times daily.     isosorbide mononitrate 120 MG 24 hr tablet  Commonly known as:  IMDUR  Take 1 tablet (120 mg total) by mouth daily.     lisinopril 40 MG tablet  Commonly known as:  PRINIVIL,ZESTRIL  Take 40 mg by mouth at bedtime.     metoprolol succinate 100  MG 24 hr tablet  Commonly known as:  TOPROL-XL  Take 100 mg by mouth at bedtime. Take with or immediately following a meal.     omeprazole 20 MG capsule  Commonly known as:  PRILOSEC  Take 1 capsule (20 mg total) by mouth daily.     pravastatin 20 MG tablet  Commonly known as:  PRAVACHOL  Take 20 mg by mouth daily.        Disposition   The patient will be discharged in stable condition to home. Discharge Orders   Future Orders Complete By Expires   Diet - low sodium heart healthy  As directed    Discharge instructions  As directed    Comments:     Since we stopped one of your blood pressure medicines, please monitor your blood pressure occasionally at home or at the grocery store/pharmacy. Call your doctor if you tend to get readings of greater than 130 on the top number or 80 on the bottom number.  Your triglyceride level was  very high - this is one of the cholesterol levels in the blood that is related to blood sugar. We started you on a medicine called fenofibrate for this.   Your Metformin was stopped because of your abnormal kidney function. Please call your primary care doctor tomorrow to discuss alternatives. Please be honest with them if you cannot afford your medicines - they are there to help.  It is extremely important that you follow up with your primary care doctor for your uncontrolled diabetes. Diabetes can easily lead to serious consequences such as heart attack, stroke, peripheral vascular disease including amputated limbs, kidney failure requiring 4-hour dialysis treatments 3 times a week, blindness, and chronic pain from nerve damage. Please call tomorrow to make that appointment.   Your kidneys have already starting to show evidence of damage. Let's keep them working!   Increase activity slowly  As directed    Comments:     You may return to work on 12/21/12.     Follow-up Information   Follow up with MANNING, Drue Stager., MD. (See discharge instructions)    Specialty:  Family Medicine   Contact information:   [place] [address] [phone]       Follow up with [name], MD. (Our office will call you for a follow-up appointment with labwork. Please call the office if you have not heard from [place] within 3 days.)    Specialty:  Cardiology   Contact information:   [address], [place] [address] [phone]         Duration of Discharge Encounter: Greater than 30 minutes including physician and PA time.  Signed, Melina Copa PA-C 12/17/2012, 3:35 PM Patient seen and examined. I agree with the assessment and plan as detailed above. See also my additional thoughts below.  I made the decision for the patient to go home. See my progress note from today. I agree with the discharge summary as outlined above.  Dola Argyle, MD, [facility] 12/17/2012 4:12 PM

## 2012-12-17 NOTE — Progress Notes (Signed)
Patient: Roberto Allen / Admit Date: 12/16/2012 / Date of Encounter: 12/17/2012, 8:55 AM   Subjective  Had a brief episode of CP last night but feels well this AM. Admits to recent noncompliance with diabetic meds. He reports compliance with blood pressure meds except he was taking them at night because his BP would drop in the morning after taking them a/w dizziness.  Last dose of Toprol was last night at home prior to coming in. As a result, did not reach target HR on treadmill. Changed to [facility] which he tolerated well.  Objective   Telemetry: pt seen down in nuc. Will need to be reviewed by MD when up to floor.  Physical Exam: Blood pressure 108/64, pulse 79, temperature 98.3 F (36.8 C), temperature source Oral, resp. rate 18, height 5\' 11"  (1.803 m), weight 204 lb 8 oz (92.761 kg), SpO2 95.00%. General: Well developed, well nourished AAM, in no acute distress. Head: Normocephalic, atraumatic, sclera non-icteric, no xanthomas, nares are without discharge. Neck: Negative for carotid bruits. JVP not elevated. Lungs: Clear bilaterally to auscultation without wheezes, rales, or rhonchi. Breathing is unlabored. Heart: RRR S1 S2 without murmurs, rubs, or gallops.  Abdomen: Soft, non-tender, non-distended with normoactive bowel sounds. No rebound/guarding. Extremities: No clubbing or cyanosis. No edema. Distal pedal pulses are 2+ and equal bilaterally. Neuro: Alert and oriented X 3. Moves all extremities spontaneously. Psych:  Responds to questions appropriately with a normal affect.  No intake or output data in the 24 hours ending 12/17/12 0855  Inpatient Medications:  . aspirin  81 mg Oral Daily  . enoxaparin (LOVENOX) injection  40 mg Subcutaneous Q24H  . glipiZIDE  10 mg Oral BID AC  . hydrALAZINE  25 mg Oral TID  . [START ON 12/18/2012] influenza vac split quadrivalent PF  0.5 mL Intramuscular Tomorrow-1000  . insulin aspart  0-9 Units Subcutaneous TID WC  . isosorbide  mononitrate  120 mg Oral Daily  . lisinopril  40 mg Oral Daily  . metoprolol succinate  100 mg Oral Daily  . pantoprazole  40 mg Oral Daily  . simvastatin  20 mg Oral q1800   Infusions:    Labs:  Recent Labs  12/17/12 0145 12/17/12 0710  NA 134* 135  K 5.0 4.8  CL 99 101  CO2 22 21  GLUCOSE 361* 372*  BUN 30* 31*  CREATININE 1.63*  1.63* 1.65*  CALCIUM 9.9 9.5  PHOS 4.1 3.7    Recent Labs  12/17/12 0145 12/17/12 0710  ALBUMIN 3.7 3.7    Recent Labs  12/16/12 1823 12/17/12 0145  WBC 10.2 10.8*  NEUTROABS 7.2  --   HGB 12.7* 12.6*  HCT 34.8* 34.8*  MCV 88.8 89.0  PLT 330 293    Recent Labs  12/17/12 0145  TROPONINI <0.30    Radiology/Studies:  Dg Chest 2 View  12/16/2012   CLINICAL DATA:  Chest pain. Shortness of breath. Diabetes and hypertension.  EXAM: CHEST  2 VIEW  COMPARISON:  04/29/2011  FINDINGS: The heart size and mediastinal contours are within normal limits. Both lungs are clear. The visualized skeletal structures are unremarkable.  IMPRESSION: No active cardiopulmonary disease.   Electronically Signed   By: Earle Gell M.D.   On: 12/16/2012 19:04     Assessment and Plan  1. Precordial chest pain with hx of nonobstructive CAD 2013 - ruled out for MI, for Cardiolite today. Continue ASA. Plan to resume BB after stress test today. Continue statin. 2. Chronic systolic CHF  due to NICM - appears compensated. Continue Imdur, hydralazine, lisinopril. Sprinolactone discontinued due to hyperkalemia. Consider changing Toprol to Coreg. 3. Acute on chronic kidney disease - ACEI has been continued but will discontinue spironolactone for now. 4. Hyperkalemia - this has occurred in the setting of marked hyperglycemia as well. He received insulin, kayexalate with normalization. KCl supp discontinued. Will also discontinue spironolactone for now. ACEI has been continued but will need to watch K closely. Monitor BP with change. He reports recent hypotension after  taking full regimen so was taking them at night.  5. Marked hypertriglyceridemia - will d/w MD.  6. Diabetes mellitus - will need to discontinue Metformin given that it is contraindicated in renal insufficiency. Continue glipizide - he has recently been noncompliant with DM meds. Add SSI. Suspect uncontrolled. Await A1C. Will need close f/u PCP. We discussed the importance of controlling DM.  Signed, Melina Copa PA-C Patient seen and examined. I agree with the assessment and plan as detailed above. See also my additional thoughts below.   The nuclear scan reveals no significant abnormality. The patient is stable. He can be discharged home.  Dola Argyle, MD, [facility] 12/17/2012 2:15 PM

## 2012-12-21 ENCOUNTER — Other Ambulatory Visit: Payer: Self-pay

## 2013-02-21 ENCOUNTER — Other Ambulatory Visit: Payer: Self-pay | Admitting: Cardiology

## 2013-03-27 DIAGNOSIS — N529 Male erectile dysfunction, unspecified: Secondary | ICD-10-CM | POA: Insufficient documentation

## 2013-07-24 ENCOUNTER — Ambulatory Visit (INDEPENDENT_AMBULATORY_CARE_PROVIDER_SITE_OTHER): Payer: BC Managed Care – PPO | Admitting: Cardiology

## 2013-07-24 ENCOUNTER — Encounter: Payer: Self-pay | Admitting: Cardiology

## 2013-07-24 VITALS — BP 140/84 | HR 69 | Ht 71.0 in | Wt 230.1 lb

## 2013-07-24 DIAGNOSIS — Z79899 Other long term (current) drug therapy: Secondary | ICD-10-CM

## 2013-07-24 DIAGNOSIS — I10 Essential (primary) hypertension: Secondary | ICD-10-CM

## 2013-07-24 DIAGNOSIS — I2510 Atherosclerotic heart disease of native coronary artery without angina pectoris: Secondary | ICD-10-CM

## 2013-07-24 DIAGNOSIS — I5022 Chronic systolic (congestive) heart failure: Secondary | ICD-10-CM

## 2013-07-24 NOTE — Patient Instructions (Signed)
Please increase your Furosemide (Lasix) to 40 mg a day for 5 days. Continue all other medications as listed.  Return in 1 week for blood work. (BMP)  Wear compression stockings daily.  Follow up in 6 weeks with a PA/NP.

## 2013-07-24 NOTE — Progress Notes (Signed)
HPI The patient presents for followup of a nonischemic cardiomyopathy. His last echo in 2013 actually demonstrated his EF was now normal. He was in the hospital in Nov and I reviewed these records.  He has some nonanginal chest pain with a perfusion study demonstrating no ischemia and a normal EF.  However, he ran out of Lasix sometime ago.  He has had about a 15 lb weight gain in 3 weeks.  He did see his primary MD and was restarted on Lasix.  He does have increased DOE but no PND.  He has no chest pain.  No fevers cough or chills.  He does have increased lower extremity edema and abdominal distension.  Of note he has been eating a lot of fast food.    Allergies  Allergen Reactions  . Metformin And Related Other (See Comments)    Due to CHF and CRI    Current Outpatient Prescriptions  Medication Sig Dispense Refill  . aspirin 81 MG tablet Take 81 mg by mouth daily.      . fenofibrate (TRICOR) 48 MG tablet Take 1 tablet (48 mg total) by mouth daily.  30 tablet  6  . furosemide (LASIX) 20 MG tablet Take 20 mg by mouth daily as needed for fluid.      Marland Kitchen glimepiride (AMARYL) 4 MG tablet       . hydrALAZINE (APRESOLINE) 25 MG tablet Take 25 mg by mouth 3 (three) times daily.      . isosorbide mononitrate (IMDUR) 120 MG 24 hr tablet TAKE 1 TABLET (120 MG TOTAL) BY MOUTH DAILY.  30 tablet  8  . JANUVIA 100 MG tablet       . LANTUS 100 UNIT/ML injection       . lisinopril (PRINIVIL,ZESTRIL) 40 MG tablet Take 40 mg by mouth at bedtime.      . metoprolol succinate (TOPROL-XL) 100 MG 24 hr tablet Take 100 mg by mouth at bedtime. Take with or immediately following a meal.      . omeprazole (PRILOSEC) 20 MG capsule Take 1 capsule (20 mg total) by mouth daily.  30 capsule  11  . pravastatin (PRAVACHOL) 20 MG tablet Take 20 mg by mouth daily.      Marland Kitchen KLOR-CON M10 10 MEQ tablet       . [DISCONTINUED] potassium chloride (KLOR-CON) 20 MEQ packet Take 20 mEq by mouth 2 (two) times daily.       No current  facility-administered medications for this visit.    Past Medical History  Diagnosis Date  . Diabetes mellitus   . COPD (chronic obstructive pulmonary disease)   . Hypertension   . Hyperlipidemia   . GERD (gastroesophageal reflux disease)   . Knee pain   . History of pneumonia   . CAD (coronary artery disease)     LHC 4/13: mD1 25%, oD2 25%, pOM1 25%, pRCA 25%, dRCA 40%, EF 30%  . Chronic systolic heart failure     a. EF 30-35% in 04/2011, improved to 55-60% in 01/2012.  Marland Kitchen NICM (nonischemic cardiomyopathy)     a. Echo 3/13: mild LVH, EF 30-35%, grade 3 diast dysfxn, mod LAE;  RHC 4/13:  RA 8, RV 58/10, PA 56/18, mean 35, PCWP mean 17, LV 154/27, CO 4.3, CI 2.0. b. Improved EF 55-60% 12/13  . CKD (chronic kidney disease), stage II   . Hyperkalemia     a. 12/2012 - spironolactone, KCl supp discontinued.  . Hypertriglyceridemia  a. 12/2012 - started on fenofibrate.    Past Surgical History  Procedure Laterality Date  . None      ROS: As stated in the history of present illness and negative for other systems.  PHYSICAL EXAM BP 140/84  Pulse 69  Ht 5\' 11"  (1.803 m)  Wt 230 lb 1.9 oz (104.382 kg)  BMI 32.11 kg/m2 GENERAL:  Well appearing HEENT:  Pupils equal round and reactive, fundi not visualized, oral mucosa unremarkable, partial NECK:  No jugular venous distention, waveform within normal limits, carotid upstroke brisk and symmetric, no bruits, no thyromegaly LYMPHATICS:  No cervical, inguinal adenopathy LUNGS:  Clear to auscultation bilaterally BACK:  No CVA tenderness CHEST:  Unremarkable HEART:  PMI displaced laterally, S1 and S2 within normal limits, positive S3 vs fixed split S2, positiveS4, no clicks, no rubs, no murmurs ABD:  Flat, positive bowel sounds normal in frequency in pitch, no bruits, no rebound, no guarding, no midline pulsatile mass, no hepatomegaly, no splenomegaly EXT:  2 plus pulses throughout, moderate edema to mid calf, no cyanosis no  clubbing  EKG: Sinus rhythm, rate 69, axis within normal limits, intervals within normal limits, no acute ST-T wave changes.  07/24/2013  ASSESSMENT AND PLAN  Chronic systolic heart failure -  I will continue his current medications. He is euvolemic. His EF is still normal I would suspect based on the Myoview. No further imaging is planned at this point.  Edema - I will increase his Lasix to 40 daily for 5 days.  I will check a BMET in 10 days.  He will decrease salt and keep his feet up and wear compression stockings.    Hypertension -  This is being managed in the context of treating his edema.  The blood pressure is at target. No change in medications is indicated. We will continue with therapeutic lifestyle changes (TLC).  CKD - As above.  His creatinine 1.86 recently.

## 2013-07-31 ENCOUNTER — Other Ambulatory Visit: Payer: Self-pay | Admitting: *Deleted

## 2013-07-31 ENCOUNTER — Other Ambulatory Visit (INDEPENDENT_AMBULATORY_CARE_PROVIDER_SITE_OTHER): Payer: BC Managed Care – PPO

## 2013-07-31 DIAGNOSIS — I10 Essential (primary) hypertension: Secondary | ICD-10-CM | POA: Diagnosis not present

## 2013-07-31 DIAGNOSIS — I5022 Chronic systolic (congestive) heart failure: Secondary | ICD-10-CM | POA: Diagnosis not present

## 2013-07-31 DIAGNOSIS — Z79899 Other long term (current) drug therapy: Secondary | ICD-10-CM

## 2013-07-31 LAB — BASIC METABOLIC PANEL
BUN: 23 mg/dL (ref 6–23)
CALCIUM: 9.4 mg/dL (ref 8.4–10.5)
CO2: 26 meq/L (ref 19–32)
CREATININE: 2.2 mg/dL — AB (ref 0.4–1.5)
Chloride: 98 mEq/L (ref 96–112)
GFR: 33.92 mL/min — ABNORMAL LOW (ref >60.00)
GLUCOSE: 441 mg/dL — AB (ref 70–99)
Potassium: 3.8 mEq/L (ref 3.5–5.1)
Sodium: 132 mEq/L — ABNORMAL LOW (ref 135–145)

## 2013-08-03 ENCOUNTER — Other Ambulatory Visit (INDEPENDENT_AMBULATORY_CARE_PROVIDER_SITE_OTHER): Payer: BC Managed Care – PPO

## 2013-08-03 DIAGNOSIS — I5022 Chronic systolic (congestive) heart failure: Secondary | ICD-10-CM

## 2013-08-03 LAB — BASIC METABOLIC PANEL
BUN: 17 mg/dL (ref 6–23)
CALCIUM: 9.3 mg/dL (ref 8.4–10.5)
CO2: 25 mEq/L (ref 19–32)
Chloride: 102 mEq/L (ref 96–112)
Creatinine, Ser: 1.9 mg/dL — ABNORMAL HIGH (ref 0.4–1.5)
GFR: 39.09 mL/min — AB (ref >60.00)
Glucose, Bld: 296 mg/dL — ABNORMAL HIGH (ref 70–99)
Potassium: 3.4 mEq/L — ABNORMAL LOW (ref 3.5–5.1)
Sodium: 136 mEq/L (ref 135–145)

## 2013-09-06 ENCOUNTER — Other Ambulatory Visit: Payer: Self-pay

## 2013-09-06 ENCOUNTER — Encounter: Payer: Self-pay | Admitting: Physician Assistant

## 2013-09-06 ENCOUNTER — Ambulatory Visit (INDEPENDENT_AMBULATORY_CARE_PROVIDER_SITE_OTHER): Payer: BC Managed Care – PPO | Admitting: Physician Assistant

## 2013-09-06 VITALS — BP 142/98 | HR 75 | Ht 71.0 in | Wt 233.0 lb

## 2013-09-06 DIAGNOSIS — I10 Essential (primary) hypertension: Secondary | ICD-10-CM

## 2013-09-06 DIAGNOSIS — E785 Hyperlipidemia, unspecified: Secondary | ICD-10-CM

## 2013-09-06 DIAGNOSIS — I428 Other cardiomyopathies: Secondary | ICD-10-CM | POA: Insufficient documentation

## 2013-09-06 DIAGNOSIS — N189 Chronic kidney disease, unspecified: Secondary | ICD-10-CM

## 2013-09-06 DIAGNOSIS — I2510 Atherosclerotic heart disease of native coronary artery without angina pectoris: Secondary | ICD-10-CM

## 2013-09-06 DIAGNOSIS — R609 Edema, unspecified: Secondary | ICD-10-CM

## 2013-09-06 MED ORDER — FUROSEMIDE 40 MG PO TABS: 40.0000 mg | ORAL_TABLET | Freq: Every day | ORAL | Status: DC

## 2013-09-06 NOTE — Progress Notes (Signed)
Cardiology Office Note    Date:  09/06/2013   ID:  Roberto Allen, DOB 1960-03-01, MRN [id number]  PCP:  Roberto Coyer, MD  Cardiologist:  Dr. [name]      History of Present Illness: Roberto Allen is a 53 y.o. male with a h/o DM2, HTN, hyperlipidemia, dilated non-ischemic cardiomyopathy/systolic CHF, non-obstructive CAD, CKD.  EF was previously 30-35%. Last echocardiogram in 2013 demonstrated improved LV function with EF 55-60%. Last seen by Dr. Percival Spanish 07/2013 he has an increase LE edema. His Lasix was adjusted. He returns for follow up. The patient denies any chest pain, significant dyspnea, syncope, orthopnea, PND. LE edema was better on higher dose Lasix. He is on a testosterone cream. Edema is listed as a side effect.   Recent Labs: 12/16/2012: Pro B Natriuretic peptide (BNP) 690.4*  12/17/2012: ALT 19; HDL Cholesterol by NMR 42; Hemoglobin 12.6*; LDL (calc) UNABLE TO CALCULATE IF TRIGLYCERIDE OVER 400 mg/dL  08/03/2013: Creatinine 1.9*; Potassium 3.4*   Wt Readings from Last 3 Encounters:  09/06/13 233 lb (105.688 kg)  07/24/13 230 lb 1.9 oz (104.382 kg)  12/17/12 204 lb 8 oz (92.761 kg)     Past Medical History  Diagnosis Date  . Diabetes mellitus   . COPD (chronic obstructive pulmonary disease)   . Hypertension   . Hyperlipidemia   . GERD (gastroesophageal reflux disease)   . Knee pain   . History of pneumonia   . CAD (coronary artery disease)     a. LHC 4/13: mD1 25%, oD2 25%, pOM1 25%, pRCA 25%, dRCA 40%, EF 30%  . Chronic systolic heart failure     a. EF 30-35% in 04/2011, improved to 55-60% in 01/2012.  Marland Kitchen NICM (nonischemic cardiomyopathy)     a. Echo 3/13: mild LVH, EF 30-35%, grade 3 diast dysfxn, mod LAE;  RHC 4/13:  RA 8, RV 58/10, PA 56/18, mean 35, PCWP mean 17, LV 154/27, CO 4.3, CI 2.0. b. EF Improved >>> Echo (01/2012):  Mild LVH, EF 55-60%, no RWMA, Gr 1 DD  . CKD (chronic kidney disease), stage II   . Hyperkalemia     a. 12/2012 -  spironolactone, KCl supp discontinued.  . Hypertriglyceridemia     a. 12/2012 - started on fenofibrate.  Marland Kitchen Hx of cardiovascular stress test     a. Nuclear (12/2012):  No ischemia, EF 59%    Current Outpatient Prescriptions  Medication Sig Dispense Refill  . aspirin 81 MG tablet Take 81 mg by mouth daily.      . fenofibrate (TRICOR) 48 MG tablet Take 1 tablet (48 mg total) by mouth daily.  30 tablet  6  . furosemide (LASIX) 20 MG tablet Take 20 mg by mouth daily as needed for fluid.      Marland Kitchen glimepiride (AMARYL) 4 MG tablet Take 4 mg by mouth daily with breakfast.       . hydrALAZINE (APRESOLINE) 25 MG tablet Take 25 mg by mouth 3 (three) times daily.      . isosorbide mononitrate (IMDUR) 120 MG 24 hr tablet TAKE 1 TABLET (120 MG TOTAL) BY MOUTH DAILY.  30 tablet  8  . KLOR-CON M10 10 MEQ tablet Take 10 mEq by mouth once.       Marland Kitchen LANTUS 100 UNIT/ML injection       . Liraglutide (VICTOZA [place]) Inject 1.2 mg into the skin every morning.      Marland Kitchen lisinopril (PRINIVIL,ZESTRIL) 40 MG tablet Take 40 mg by mouth at bedtime.      Marland Kitchen  metoprolol succinate (TOPROL-XL) 100 MG 24 hr tablet Take 100 mg by mouth at bedtime. Take with or immediately following a meal.      . omeprazole (PRILOSEC) 20 MG capsule Take 1 capsule (20 mg total) by mouth daily.  30 capsule  11  . pravastatin (PRAVACHOL) 20 MG tablet Take 20 mg by mouth daily.      . [DISCONTINUED] potassium chloride (KLOR-CON) 20 MEQ packet Take 20 mEq by mouth 2 (two) times daily.       No current facility-administered medications for this visit.    Allergies:   Metformin and related   Social History:  The patient  reports that he quit smoking about 6 years ago. His smoking use included Cigarettes. He has a 20 pack-year smoking history. He has never used smokeless tobacco. He reports that he drinks alcohol. He reports that he does not use illicit drugs.   Family History:  The patient's family history includes Breast cancer in his maternal  grandmother and mother; Diabetes in his sister.   ROS:  Please see the history of present illness.      All other systems reviewed and negative.   PHYSICAL EXAM: VS:  BP 142/98  Pulse 75  Ht 5\' 11"  (1.803 m)  Wt 233 lb (105.688 kg)  BMI 32.51 kg/m2 Well nourished, well developed, in no acute distress HEENT: normal Neck: no JVD Cardiac:  normal S1, S2; RRR; no murmur Lungs:  clear to auscultation bilaterally, no wheezing, rhonchi or rales Abd: soft, nontender, no hepatomegaly Ext: 2+ bilateral LE edema Skin: warm and dry Neuro:  CNs 2-12 intact, no focal abnormalities noted  EKG:  NSR, HR 75, inf-lat TWI, no change from prior tracing      ASSESSMENT AND PLAN:  Edema Patient notes some improvement with resuming his Lasix. He felt better on a higher dose of Lasix. He also is on a testosterone cream which may be contributing to his edema. I suspect his edema is mainly related to salt indiscretion, diastolic dysfunction, chronic kidney disease. However, he can discuss with his urologist whether there is an alternative. I will increase his Lasix back to 40 mg daily. We discussed the importance of low-salt diet. I have asked him to wear his compression socks daily.  NICM (nonischemic cardiomyopathy) Ejection fraction returned to normal. Continue hydralazine, nitrates, ACE inhibitor, beta blocker.  Atherosclerosis of native coronary artery of native heart without angina pectoris No angina. Continue aspirin, statin  Essential hypertension Blood pressure elevated. He took his medications just prior to entering the room. Previously, blood pressure was better. Adjust Lasix as noted. Continue to monitor.  Hyperlipidemia Continue statin.  CKD (chronic kidney disease), unspecified stage  A recent creatinine stable. Repeat basic metabolic panel in one week with increased dose of Lasix.  Disposition:  Follow up with me in 6 weeks.   Signed, Versie Starks, MHS 09/06/2013 8:40 AM      [facility] [address], [place], [place]  [address] Phone: [phone]; Fax: [phone]

## 2013-09-06 NOTE — Patient Instructions (Addendum)
Increase your Lasix (Furosemide) to 40 mg daily.  You can take 2 tablets of the 20 mg to equal 40 mg.  A new prescription has been sent to your pharmacy.  Return for labs in 1 week:  BMET (782.3, 585.9). 09/14/13 7:30-4:30  Wear compression stockings daily.  Please follow up with your urologist about the Testosterone cream.  It is possible it is contributing some to your swelling.  YOU HAVE A FOLLOW UP WITH [facility], [facility] 10/10/13 @ 8:30  Follow a low salt diet.  See the information below.   Low-Sodium Eating Plan Sodium raises blood pressure and causes water to be held in the body. Getting less sodium from food will help lower your blood pressure, reduce any swelling, and protect your heart, liver, and kidneys. We get sodium by adding salt (sodium chloride) to food. Most of our sodium comes from canned, boxed, and frozen foods. Restaurant foods, fast foods, and pizza are also very high in sodium. Even if you take medicine to lower your blood pressure or to reduce fluid in your body, getting less sodium from your food is important. WHAT IS MY PLAN? Most people should limit their sodium intake to 2,300 mg a day. Your health care provider recommends that you limit your sodium intake to 2000 mg a day.  WHAT DO I NEED TO KNOW ABOUT THIS EATING PLAN? For the low-sodium eating plan, you will follow these general guidelines:  Choose foods with a % Daily Value for sodium of less than 5% (as listed on the food label).   Use salt-free seasonings or herbs instead of table salt or sea salt.   Check with your health care provider or pharmacist before using salt substitutes.   Eat fresh foods.  Eat more vegetables and fruits.  Limit canned vegetables. If you do use them, rinse them well to decrease the sodium.   Limit cheese to 1 oz (28 g) per day.   Eat lower-sodium products, often labeled as "lower sodium" or "no salt added."  Avoid foods that contain monosodium glutamate (MSG). MSG is  sometimes added to [place] food and some canned foods.  Check food labels (Nutrition Facts labels) on foods to learn how much sodium is in one serving.  Eat more home-cooked food and less restaurant, buffet, and fast food.  When eating at a restaurant, ask that your food be prepared with less salt or none, if possible.  HOW DO I READ FOOD LABELS FOR SODIUM INFORMATION? The Nutrition Facts label lists the amount of sodium in one serving of the food. If you eat more than one serving, you must multiply the listed amount of sodium by the number of servings. Food labels may also identify foods as:  Sodium free--Less than 5 mg in a serving.  Very low sodium--35 mg or less in a serving.  Low sodium--140 mg or less in a serving.  Light in sodium--50% less sodium in a serving. For example, if a food that usually has 300 mg of sodium is changed to become light in sodium, it will have 150 mg of sodium.  Reduced sodium--25% less sodium in a serving. For example, if a food that usually has 400 mg of sodium is changed to reduced sodium, it will have 300 mg of sodium. WHAT FOODS CAN I EAT? Grains Low-sodium cereals, including oats, puffed wheat and rice, and shredded wheat cereals. Low-sodium crackers. Unsalted rice and pasta. Lower-sodium bread.  Vegetables Frozen or fresh vegetables. Low-sodium or reduced-sodium canned vegetables.  Low-sodium or reduced-sodium tomato sauce and paste. Low-sodium or reduced-sodium tomato and vegetable juices.  Fruits Fresh, frozen, and canned fruit. Fruit juice.  Meat and Other Protein Products Low-sodium canned tuna and salmon. Fresh or frozen meat, poultry, seafood, and fish. Lamb. Unsalted nuts. Dried beans, peas, and lentils without added salt. Unsalted canned beans. Homemade soups without salt. Eggs.  Dairy Milk. Soy milk. Ricotta cheese. Low-sodium or reduced-sodium cheeses. Yogurt.  Condiments Fresh and dried herbs and spices. Salt-free seasonings.  Onion and garlic powders. Low-sodium varieties of mustard and ketchup. Lemon juice.  Fats and Oils Reduced-sodium salad dressings. Unsalted butter.  Other Unsalted popcorn and pretzels.  The items listed above may not be a complete list of recommended foods or beverages. Contact your dietitian for more options. WHAT FOODS ARE NOT RECOMMENDED? Grains Instant hot cereals. Bread stuffing, pancake, and biscuit mixes. Croutons. Seasoned rice or pasta mixes. Noodle soup cups. Boxed or frozen macaroni and cheese. Self-rising flour. Regular salted crackers. Vegetables Regular canned vegetables. Regular canned tomato sauce and paste. Regular tomato and vegetable juices. Frozen vegetables in sauces. Salted french fries. Olives. Angie Fava. Relishes. Sauerkraut. Salsa. Meat and Other Protein Products Salted, canned, smoked, spiced, or pickled meats, seafood, or fish. Bacon, ham, sausage, hot dogs, corned beef, chipped beef, and packaged luncheon meats. Salt pork. Jerky. Pickled herring. Anchovies, regular canned tuna, and sardines. Salted nuts. Dairy Processed cheese and cheese spreads. Cheese curds. Blue cheese and cottage cheese. Buttermilk.  Condiments Onion and garlic salt, seasoned salt, table salt, and sea salt. Canned and packaged gravies. Worcestershire sauce. Tartar sauce. Barbecue sauce. Teriyaki sauce. Soy sauce, including reduced sodium. Steak sauce. Fish sauce. Oyster sauce. Cocktail sauce. Horseradish. Regular ketchup and mustard. Meat flavorings and tenderizers. Bouillon cubes. Hot sauce. Tabasco sauce. Marinades. Taco seasonings. Relishes. Fats and Oils Regular salad dressings. Salted butter. Margarine. Ghee. Bacon fat.  Other Potato and tortilla chips. Corn chips and puffs. Salted popcorn and pretzels. Canned or dried soups. Pizza. Frozen entrees and pot pies.  The items listed above may not be a complete list of foods and beverages to avoid. Contact your dietitian for more  information. Document Released: 07/24/2001 Document Revised: 02/06/2013 Document Reviewed: 12/06/2012 [facility] Patient Information 2015 [place], [place]. This information is not intended to replace advice given to you by your health care provider. Make sure you discuss any questions you have with your health care provider.

## 2013-09-14 ENCOUNTER — Telehealth: Payer: Self-pay | Admitting: *Deleted

## 2013-09-14 ENCOUNTER — Other Ambulatory Visit (INDEPENDENT_AMBULATORY_CARE_PROVIDER_SITE_OTHER): Payer: BC Managed Care – PPO

## 2013-09-14 DIAGNOSIS — N189 Chronic kidney disease, unspecified: Secondary | ICD-10-CM

## 2013-09-14 LAB — BASIC METABOLIC PANEL
BUN: 16 mg/dL (ref 6–23)
CALCIUM: 8.8 mg/dL (ref 8.4–10.5)
CO2: 31 mEq/L (ref 19–32)
Chloride: 100 mEq/L (ref 96–112)
Creatinine, Ser: 1.9 mg/dL — ABNORMAL HIGH (ref 0.4–1.5)
GFR: 39.31 mL/min — ABNORMAL LOW (ref >60.00)
Glucose, Bld: 242 mg/dL — ABNORMAL HIGH (ref 70–99)
Potassium: 3.7 mEq/L (ref 3.5–5.1)
SODIUM: 137 meq/L (ref 135–145)

## 2013-09-14 NOTE — Telephone Encounter (Signed)
lmptcb for lab results 

## 2013-09-14 NOTE — Telephone Encounter (Signed)
Roberto Allen notified about lab results with verbal understanding. I wished Roberto Allen Happy Birthday since today is his birthday. Roberto Allen said thank you

## 2013-09-14 NOTE — Telephone Encounter (Signed)
Follow Up    Pt calling following up on test results. Please call back.

## 2013-10-10 ENCOUNTER — Ambulatory Visit: Payer: BC Managed Care – PPO | Admitting: Physician Assistant

## 2013-10-18 ENCOUNTER — Encounter: Payer: Self-pay | Admitting: *Deleted

## 2013-10-18 ENCOUNTER — Encounter: Payer: BC Managed Care – PPO | Attending: Endocrinology | Admitting: *Deleted

## 2013-10-18 VITALS — Ht 71.0 in | Wt 223.5 lb

## 2013-10-18 DIAGNOSIS — E119 Type 2 diabetes mellitus without complications: Secondary | ICD-10-CM | POA: Insufficient documentation

## 2013-10-18 DIAGNOSIS — Z713 Dietary counseling and surveillance: Secondary | ICD-10-CM | POA: Insufficient documentation

## 2013-10-18 NOTE — Progress Notes (Signed)
Appt start time: 0900 end time:  1030.  Assessment:  Patient was seen on  10/18/13 for individual diabetes education. States history of Diabetes for over 5 years Lives with wife, he shops and she does the cooking. 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 SMBG twice a day in AM and at bedtime with reported range of 120-340 the past week as he has had a cold. He states it was below 150 before he got this cold.  No physical activity outside of work.   Patient Education Plan per assessed needs and concerns is to attend individual session for Diabetes Self Management Education.  Current HbA1c: 10.9%  Preferred Learning Style:   No preference indicated   Learning Readiness:   Ready  Change in progress  MEDICATIONS: see list,   DIETARY INTAKE:  24-hr recall:  B ( AM): [place]: regular oatmeal with brown sugar OR 1/2 Sausage biscuit OR egg platter, coffee with sugar x 6 tsp,  Snk ( AM): occasionally chips  L ( PM): Fast Food: burger, occasionally chili or fries or salad, sweet tea Snk ( PM): no D ( PM): tv dinner with meat, starch and vegetable OR home cooked meal of meat, starch, vegetable, sweet tea Snk ( PM): no Beverages: coffee with sugar, sweet tea, water  Usual physical activity: active at work, walks all day  Estimated energy needs: 1600 calories 180 g carbohydrates 120 g protein 44 g fat  Progress Towards Goal(s):  In progress.   Nutritional Diagnosis:  NB-1.1 Food and nutrition-related knowledge deficit As related to Diabetes.  As evidenced by A1c of 10.9%.    Intervention:  Nutrition counseling provided.  Discussed diabetes disease process and treatment options.  Discussed physiology of diabetes and role of obesity on insulin resistance.  Encouraged moderate weight reduction to improve glucose levels.    Provided education on macronutrients on glucose levels.  Provided education on carb counting, importance of regularly  scheduled meals/snacks, and meal planning  Reviewed patient medications.  Discussed role of medication on blood glucose and possible side effects  Discussed blood glucose monitoring and interpretation.  Discussed recommended target ranges and individual ranges.    Described short-term complications: hyper- and hypo-glycemia.  Discussed causes,symptoms, and treatment options.  Plan to continue education at next visit:  Discuss prevention, detection, and treatment of long-term complications.  Discussed the role of prolonged elevated glucose levels on body systems.  Discuss role of stress on blood glucose levels and discussed strategies to manage psychosocial issues.  Discuss recommendations for long-term diabetes self-care.  Established checklist for medical, dental, and emotional self-care.  Discuss effects of physical activity on glucose levels and long-term glucose control.  Will recommend 150 minutes of physical activity/week.  Plan:  Aim for 4 Carb Choices per meal (60 grams) +/- 1 either way  Aim for 0-2 Carbs per snack if hungry  Include protein in moderation with your meals and snacks Consider reading food labels for Total Carbohydrate of foods Continue checking BG at alternate times per day as directed by MD  Continue taking medication as directed by MD Consider taking larger dose of Lantus as MD directed to bring Fasting BG down to target range Consider calorie free beverages in place of regular soda or sweet tea  Teaching Method Utilized: Visual, Auditory and Hands on  Handouts given during visit include: Living Well with Diabetes Carb Counting and Food Label handouts Meal Plan Card  Barriers to learning/adherence to  lifestyle change: none, he appears interested and motivated with improved understanding of diabetes and relationship of carb choices to BG management.  Diabetes self-care support plan:   [facility] support group available  Demonstrated degree of understanding  via:  Teach Back   Monitoring/Evaluation:  Dietary intake, exercise, reading food labels, and body weight in 2 month(s).

## 2013-10-18 NOTE — Patient Instructions (Signed)
Plan:  Aim for 4 Carb Choices per meal (60 grams) +/- 1 either way  Aim for 0-2 Carbs per snack if hungry  Include protein in moderation with your meals and snacks Consider reading food labels for Total Carbohydrate of foods Continue checking BG at alternate times per day as directed by MD  Continue taking medication as directed by MD

## 2013-10-25 ENCOUNTER — Ambulatory Visit: Payer: BC Managed Care – PPO | Admitting: Physician Assistant

## 2013-11-01 ENCOUNTER — Ambulatory Visit (INDEPENDENT_AMBULATORY_CARE_PROVIDER_SITE_OTHER): Payer: BC Managed Care – PPO | Admitting: Physician Assistant

## 2013-11-01 ENCOUNTER — Encounter: Payer: Self-pay | Admitting: Physician Assistant

## 2013-11-01 VITALS — BP 128/82 | HR 77 | Ht 71.0 in | Wt 228.8 lb

## 2013-11-01 DIAGNOSIS — I10 Essential (primary) hypertension: Secondary | ICD-10-CM

## 2013-11-01 DIAGNOSIS — R609 Edema, unspecified: Secondary | ICD-10-CM

## 2013-11-01 DIAGNOSIS — I428 Other cardiomyopathies: Secondary | ICD-10-CM

## 2013-11-01 DIAGNOSIS — E785 Hyperlipidemia, unspecified: Secondary | ICD-10-CM

## 2013-11-01 DIAGNOSIS — N189 Chronic kidney disease, unspecified: Secondary | ICD-10-CM

## 2013-11-01 DIAGNOSIS — I2510 Atherosclerotic heart disease of native coronary artery without angina pectoris: Secondary | ICD-10-CM

## 2013-11-01 NOTE — Patient Instructions (Signed)
Your physician wants you to follow-up in: [place] [address]. [place]. You will receive a reminder letter in the mail two months in advance. If you don't receive a letter, please call our office to schedule the follow-up appointment.

## 2013-11-01 NOTE — Progress Notes (Signed)
Cardiology Office Note    Date:  11/01/2013   ID:  Roberto Allen, DOB 1960/11/11, MRN [id number]  PCP:  [name], MD  Cardiologist:  Dr. [name]      History of Present Illness: Roberto Allen is a 53 y.o. male with a h/o DM2, HTN, hyperlipidemia, dilated non-ischemic cardiomyopathy, systolic CHF, non-obstructive CAD, CKD.  EF was previously 30-35%. Last echocardiogram in 2013 demonstrated improved LV function with EF 55-60%.  Last seen here by me 09/06/13 for management of edema.  I adjusted his Lasix.  He returns for FU.  He is doing well.  The patient denies chest pain, shortness of breath, syncope, orthopnea, PND.  LE edema is improved.     Recent Labs: 12/16/2012: Pro B Natriuretic peptide (BNP) 690.4*  12/17/2012: ALT 19; HDL Cholesterol by NMR 42; Hemoglobin 12.6*; LDL (calc) UNABLE TO CALCULATE IF TRIGLYCERIDE OVER 400 mg/dL  09/14/2013: Creatinine 1.9*; Potassium 3.7   Wt Readings from Last 3 Encounters:  11/01/13 228 lb 12.8 oz (103.783 kg)  10/18/13 223 lb 8 oz (101.379 kg)  09/06/13 233 lb (105.688 kg)     Past Medical History  Diagnosis Date  . Diabetes mellitus   . COPD (chronic obstructive pulmonary disease)   . Hypertension   . Hyperlipidemia   . GERD (gastroesophageal reflux disease)   . Knee pain   . History of pneumonia   . CAD (coronary artery disease)     a. LHC 4/13: mD1 25%, oD2 25%, pOM1 25%, pRCA 25%, dRCA 40%, EF 30%  . Chronic systolic heart failure     a. EF 30-35% in 04/2011, improved to 55-60% in 01/2012.  Marland Kitchen NICM (nonischemic cardiomyopathy)     a. Echo 3/13: mild LVH, EF 30-35%, grade 3 diast dysfxn, mod LAE;  RHC 4/13:  RA 8, RV 58/10, PA 56/18, mean 35, PCWP mean 17, LV 154/27, CO 4.3, CI 2.0. b. EF Improved >>> Echo (01/2012):  Mild LVH, EF 55-60%, no RWMA, Gr 1 DD  . CKD (chronic kidney disease), stage II   . Hyperkalemia     a. 12/2012 - spironolactone, KCl supp discontinued.  . Hypertriglyceridemia     a. 12/2012 -  started on fenofibrate.  Marland Kitchen Hx of cardiovascular stress test     a. Nuclear (12/2012):  No ischemia, EF 59%    Current Outpatient Prescriptions  Medication Sig Dispense Refill  . aspirin 81 MG tablet Take 81 mg by mouth daily.      . fenofibrate (TRICOR) 48 MG tablet Take 1 tablet (48 mg total) by mouth daily.  30 tablet  6  . furosemide (LASIX) 40 MG tablet Take 1 tablet (40 mg total) by mouth daily.  30 tablet  11  . hydrALAZINE (APRESOLINE) 25 MG tablet Take 25 mg by mouth 3 (three) times daily.      . isosorbide mononitrate (IMDUR) 120 MG 24 hr tablet TAKE 1 TABLET (120 MG TOTAL) BY MOUTH DAILY.  30 tablet  8  . LANTUS 100 UNIT/ML injection       . Liraglutide (VICTOZA [place]) Inject 1.2 mg into the skin every morning.      Marland Kitchen lisinopril (PRINIVIL,ZESTRIL) 40 MG tablet Take 40 mg by mouth at bedtime.      . metoprolol succinate (TOPROL-XL) 100 MG 24 hr tablet Take 100 mg by mouth at bedtime. Take with or immediately following a meal.      . pravastatin (PRAVACHOL) 20 MG tablet Take 20 mg by mouth daily.      Marland Kitchen  omeprazole (PRILOSEC) 20 MG capsule Take 1 capsule (20 mg total) by mouth daily.  30 capsule  11  . [DISCONTINUED] potassium chloride (KLOR-CON) 20 MEQ packet Take 20 mEq by mouth 2 (two) times daily.       No current facility-administered medications for this visit.    Allergies:   Metformin and related   Social History:  The patient  reports that he quit smoking about 7 years ago. His smoking use included Cigarettes. He has a 20 pack-year smoking history. He has never used smokeless tobacco. He reports that he drinks alcohol. He reports that he does not use illicit drugs.   Family History:  The patient's family history includes Breast cancer in his maternal grandmother and mother; Diabetes in his sister.   ROS:  Please see the history of present illness.      All other systems reviewed and negative.   PHYSICAL EXAM: VS:  BP 128/82  Pulse 77  Ht 5\' 11"  (1.803 m)  Wt 228 lb  12.8 oz (103.783 kg)  BMI 31.93 kg/m2 Well nourished, well developed, in no acute distress HEENT: normal Neck: no JVD Cardiac:  normal S1, S2; RRR; no murmur Lungs:  clear to auscultation bilaterally, no wheezing, rhonchi or rales Abd: soft, nontender, no hepatomegaly Ext: 1+ bilateral LE edema Skin: warm and dry Neuro:  CNs 2-12 intact, no focal abnormalities noted  EKG:  NSR, HR 77, normal axis, inf-lat TWI, no changes      ASSESSMENT AND PLAN:  1. Edema:  Much improved.  Continue current therapy.   2. NICM (nonischemic cardiomyopathy):  EF returned to normal.  Continue beta blocker, ACEI, hydralazine, nitrates. 3. Atherosclerosis of native coronary artery of native heart without angina pectoris:  No angina. Continue aspirin, statin 4. Essential hypertension:  Controlled.  5. Hyperlipidemia:  Continue statin. 6. CKD (chronic kidney disease), unspecified stage:  Recent creatinine stable. 7. Erectile Dysfunction:  His urologist asked him to discuss his medications.  I explained to him that his EF improved on his current medical Rx. I would recommend remaining on his current regimen which includes nitrates.  He will need to consider other options for treatment of ED and avoid PDE-5 inhibitors.    Disposition:  FU with Dr. [name] in 6 mos.   Signed, Versie Starks, MHS 11/01/2013 8:48 AM    [facility] [address], [place], [place]  [address] Phone: [phone]; Fax: [phone]

## 2013-12-20 ENCOUNTER — Ambulatory Visit: Payer: BC Managed Care – PPO | Admitting: *Deleted

## 2013-12-27 ENCOUNTER — Ambulatory Visit: Payer: BC Managed Care – PPO | Admitting: *Deleted

## 2014-01-24 ENCOUNTER — Ambulatory Visit: Payer: BC Managed Care – PPO | Admitting: *Deleted

## 2014-06-26 DIAGNOSIS — E1122 Type 2 diabetes mellitus with diabetic chronic kidney disease: Secondary | ICD-10-CM | POA: Insufficient documentation

## 2014-06-26 DIAGNOSIS — N183 Chronic kidney disease, stage 3 unspecified: Secondary | ICD-10-CM | POA: Insufficient documentation

## 2014-08-12 ENCOUNTER — Other Ambulatory Visit: Payer: Self-pay

## 2014-08-20 ENCOUNTER — Encounter: Payer: Self-pay | Admitting: Gastroenterology

## 2014-08-20 NOTE — Telephone Encounter (Signed)
Error

## 2014-10-03 DIAGNOSIS — E1129 Type 2 diabetes mellitus with other diabetic kidney complication: Secondary | ICD-10-CM | POA: Insufficient documentation

## 2014-10-03 DIAGNOSIS — R809 Proteinuria, unspecified: Secondary | ICD-10-CM

## 2014-10-24 ENCOUNTER — Ambulatory Visit: Payer: Self-pay | Admitting: Gastroenterology

## 2014-12-20 ENCOUNTER — Encounter: Payer: Self-pay | Admitting: Podiatry

## 2014-12-20 ENCOUNTER — Telehealth: Payer: Self-pay | Admitting: *Deleted

## 2014-12-20 ENCOUNTER — Ambulatory Visit (INDEPENDENT_AMBULATORY_CARE_PROVIDER_SITE_OTHER): Payer: BLUE CROSS/BLUE SHIELD | Admitting: Podiatry

## 2014-12-20 ENCOUNTER — Ambulatory Visit (INDEPENDENT_AMBULATORY_CARE_PROVIDER_SITE_OTHER): Payer: BLUE CROSS/BLUE SHIELD

## 2014-12-20 VITALS — BP 164/86 | HR 69 | Resp 12

## 2014-12-20 DIAGNOSIS — R52 Pain, unspecified: Secondary | ICD-10-CM | POA: Diagnosis not present

## 2014-12-20 DIAGNOSIS — L89891 Pressure ulcer of other site, stage 1: Secondary | ICD-10-CM

## 2014-12-20 DIAGNOSIS — L03119 Cellulitis of unspecified part of limb: Secondary | ICD-10-CM

## 2014-12-20 DIAGNOSIS — L97522 Non-pressure chronic ulcer of other part of left foot with fat layer exposed: Secondary | ICD-10-CM

## 2014-12-20 MED ORDER — AMOXICILLIN-POT CLAVULANATE 875-125 MG PO TABS: 1.0000 | ORAL_TABLET | Freq: Two times a day (BID) | ORAL | Status: DC

## 2014-12-20 MED ORDER — SILVER SULFADIAZINE 1 % EX CREA: 1.0000 "application " | TOPICAL_CREAM | Freq: Every day | CUTANEOUS | Status: DC

## 2014-12-20 NOTE — Progress Notes (Signed)
   Subjective:    Patient ID: Roberto Allen, male    DOB: 03-09-1960, 54 y.o.   MRN: [id number]  HPI 54 year old male presents the outside concerns over the bottom of his left foot which has been ongoing for about 1 month. He states he has noticed increasing smell as well as the wound getting bigger and not healing. He states that he has noticed some swelling to his foot as well. He gets some bloody to clear drainage from November denies any pus. He denies any redness or red streaks. He has been using hydrogel but has not been helping.   Review of Systems  Cardiovascular: Positive for leg swelling.       Objective:   Physical Exam General: AAO x3, NAD  Dermatological: On the plantar aspect the left first metatarsal head as the annular ulceration with surrounding hyperkeratotic and macerated periwound. After debridement the wound measures approximate 2.5 x 2.5 x 0.3 cm with a granular wound base. The wound does not probe to bone there is no undermining or tunneling. There is no surrounding erythema, ascending cellulitis, fluctuance, crepitus, purulence. This is small amount of serosanguineous drainage expressed. There is mild malodor. No other open lesions or pre-ulcerative lesions. There is edema to the left forefoot and to the foot without any increase in warmth or erythema.  Vascular: Dorsalis Pedis artery and Posterior Tibial artery pedal pulses are 2/4 bilateral with immedate capillary fill time. Pedal hair growth present. No varicosities and no lower extremity edema present bilateral. There is no pain with calf compression, swelling, warmth, erythema.   Neruologic: Protective sensation decreased with Derrel Nip monofilament. Patellar and Achilles deep tendon reflexes 2+ bilateral. No Babinski or clonus noted bilateral.   Musculoskeletal: No gross boney pedal deformities bilateral. No pain, crepitus, or limitation noted with foot and ankle range of motion bilateral. Muscular strength  5/5 in all groups tested bilateral. No areas of tenderness bilateral lower extremities.  Gait: Unassisted, Nonantalgic.       Assessment & Plan:  54 year old male with left foot ulceration, early infection -X-rays were obtained and reviewed with the patient. There is no definitive cortical changes to suggest osteomyelitis measures no soft tissue emphysema. -Treatment options discussed including all alternatives, risks, and complications -Wound was debrided without complications to granular tissue. For now Silvadene or antibiotic ointment daily. I will order collagen silver protective dressing changes. -Prescribed Augmentin. -Offloading pads dispensed -Discussed with the patient in detail to closely monitor for any signs or symptoms of worsening infection. Discussed with him that these wounds can progress rapidly. This any increase signs or symptoms of infection to go directly to the emergency room. Discussed that he is at high likelihood of amputation. -Follow-up in 1 week or sooner if any problems arise. In the meantime, encouraged to call the office with any questions, concerns, change in symptoms.   Celesta Gentile, DPM

## 2014-12-20 NOTE — Telephone Encounter (Signed)
Pt asked if he was to shower and redress the foot daily.  Dr. Jacqualyn Posey states clean and dress every day with Silvadene and take the antibiotic.

## 2014-12-24 ENCOUNTER — Telehealth: Payer: Self-pay | Admitting: *Deleted

## 2014-12-24 NOTE — Telephone Encounter (Addendum)
[place] states she needs the length,width and depth of the wound for this pt, so insurance will cover the depth must be at least 0.1. Received faxed request for wound measurements.  Faxed measurements of left foot ulcer 2.5x2.5x0.4cm.

## 2014-12-25 ENCOUNTER — Encounter: Payer: Self-pay | Admitting: Podiatry

## 2014-12-25 NOTE — Telephone Encounter (Signed)
Dictation done. 2.5 x 2.5 x 0.4 cm

## 2014-12-27 ENCOUNTER — Encounter: Payer: Self-pay | Admitting: Podiatry

## 2014-12-27 ENCOUNTER — Ambulatory Visit (INDEPENDENT_AMBULATORY_CARE_PROVIDER_SITE_OTHER): Payer: BLUE CROSS/BLUE SHIELD | Admitting: Podiatry

## 2014-12-27 DIAGNOSIS — L89891 Pressure ulcer of other site, stage 1: Secondary | ICD-10-CM | POA: Diagnosis not present

## 2014-12-27 DIAGNOSIS — L97522 Non-pressure chronic ulcer of other part of left foot with fat layer exposed: Secondary | ICD-10-CM

## 2014-12-27 NOTE — Patient Instructions (Signed)
Monitor for any signs/symptoms of infection. Call the office immediately if any occur or go directly to the emergency room. Call with any questions/concerns.  

## 2015-01-02 NOTE — Progress Notes (Signed)
Patient ID: Roberto Allen, male   DOB: 1961-01-31, 54 y.o.   MRN: [id number]  Subjective: Patient presents the office they for follow-up evaluation of wound on the bottom of his left foot. He does feel that he is better than he was last appointment. He gets some occasional bloody drainage but denies any pus. He's been continuing with antibiotics. He has been using Silvadene over the wound daily. He denies any malodor. No other complaints at this time. Denies any systemic complaints such as fevers, chills, nausea, vomiting. No calf pain, chest pain, she is directed.  Objective: AAO 3, NAD DP/PT pulses palpable 2/4, CRT less than 3 seconds Protective sensation decreased with Simms Weinstein monofilament On the plantar aspect of the left first metatarsal had his annual ulceration with hyperkeratotic periwound. After debridement it the wound measures 1.8 by 1.5 x 0.2 cm with a granular wound base. There is no probing, undermining, tunneling. There is no swelling erythema, ascending cellulitis. There is a small mild epidermolysis present which is residual from previous appointment although it is improved. There is no areas of pluses or crepitus. No malodor. There is no clinical signs of infection and the wound appears to be healing. There is decreased edema to the left foot. There is no increase in warmth. No other open lesions or pre-ulcerative lesions. There is no pain with calf compression, swelling, warmth, erythema.   Assessment:  Patient presents for follow-up evaluation left  submetatarsal one wound   Plan: -X-rays were obtained and reviewed with the patient.  -Treatment options discussed including all alternatives, risks, and complications -Wound was debrided without complication/bleeding. -Continue with daily dressing changes. Continue with offloading. Recommended to hold off until not to work until the wound heals. -Monitor for any clinical signs or symptoms of infection and directed to call  the office immediately should any occur or go to the ER. -Follow-up as scheduled or sooner if any problems arise. In the meantime, encouraged to call the office with any questions, concerns, change in symptoms.   Celesta Gentile, DPM

## 2015-01-03 ENCOUNTER — Ambulatory Visit (INDEPENDENT_AMBULATORY_CARE_PROVIDER_SITE_OTHER): Payer: BLUE CROSS/BLUE SHIELD | Admitting: Podiatry

## 2015-01-03 ENCOUNTER — Encounter: Payer: Self-pay | Admitting: Podiatry

## 2015-01-03 VITALS — BP 177/99 | HR 94 | Resp 18

## 2015-01-03 DIAGNOSIS — L97522 Non-pressure chronic ulcer of other part of left foot with fat layer exposed: Secondary | ICD-10-CM

## 2015-01-03 DIAGNOSIS — L89891 Pressure ulcer of other site, stage 1: Secondary | ICD-10-CM

## 2015-01-04 ENCOUNTER — Other Ambulatory Visit: Payer: Self-pay | Admitting: Podiatry

## 2015-01-06 ENCOUNTER — Encounter: Payer: Self-pay | Admitting: Podiatry

## 2015-01-06 DIAGNOSIS — L97529 Non-pressure chronic ulcer of other part of left foot with unspecified severity: Secondary | ICD-10-CM | POA: Insufficient documentation

## 2015-01-06 DIAGNOSIS — L97522 Non-pressure chronic ulcer of other part of left foot with fat layer exposed: Secondary | ICD-10-CM

## 2015-01-06 NOTE — Progress Notes (Signed)
Patient ID: Roberto Allen, male   DOB: [birth year], 54 y.o.   MRN: [id number]  Subjective: Patient presents the office they for follow-up evaluation of wound on the bottom of his left foot. He does feel that he is better than he was last appointment and continues to improve. He gets some occasional bloody/clear drainage but denies any pus. He's been continuing with antibiotics. He has been using Silvadene over the wound daily. He denies any malodor. No other complaints at this time. Denies any systemic complaints such as fevers, chills, nausea, vomiting. No calf pain, chest pain, she is directed.  Objective: AAO 3, NAD DP/PT pulses palpable 2/4, CRT less than 3 seconds Protective sensation decreased with Simms Weinstein monofilament On the plantar aspect of the left first metatarsal had his annual ulceration with hyperkeratotic periwound. After debridement it the wound measures 1.2 x 1 x 0.4 cm with a granular wound base.The wound is superficial except for the lateral portion which has a depth of 0.4 cm. There is no probing, undermining, tunneling. There is no surrounding erythema, ascending cellulitis. There is no significant epidermolysis. There is no areas of fluctuance or crepitus. No malodor. There is no clinical signs of infection and the wound appears to be healing. There is decreased edema to the left foot. There is no increase in warmth. No other open lesions or pre-ulcerative lesions. There is no pain with calf compression, swelling, warmth, erythema.   Assessment:  Patient presents for follow-up evaluation left  submetatarsal one wound   Plan:.  -Treatment options discussed including all alternatives, risks, and complications -Wound was debrided without complication/bleeding. -Continue with daily dressing changes. Continue with offloading. Recommended to hold off returning to work until the wound heals. -Finish course of antibiotics.  -Monitor for any clinical signs or symptoms of  infection and directed to call the office immediately should any occur or go to the ER. -Follow-up in 10 days or sooner if any problems arise. In the meantime, encouraged to call the office with any questions, concerns, change in symptoms.  *x-ray next appointment   Celesta Gentile, DPM

## 2015-01-14 ENCOUNTER — Ambulatory Visit (INDEPENDENT_AMBULATORY_CARE_PROVIDER_SITE_OTHER): Payer: BLUE CROSS/BLUE SHIELD

## 2015-01-14 ENCOUNTER — Ambulatory Visit (INDEPENDENT_AMBULATORY_CARE_PROVIDER_SITE_OTHER): Payer: BLUE CROSS/BLUE SHIELD | Admitting: Podiatry

## 2015-01-14 ENCOUNTER — Encounter: Payer: Self-pay | Admitting: Podiatry

## 2015-01-14 VITALS — BP 192/114 | HR 92 | Resp 18

## 2015-01-14 DIAGNOSIS — L89891 Pressure ulcer of other site, stage 1: Secondary | ICD-10-CM

## 2015-01-14 DIAGNOSIS — R52 Pain, unspecified: Secondary | ICD-10-CM

## 2015-01-14 DIAGNOSIS — L97522 Non-pressure chronic ulcer of other part of left foot with fat layer exposed: Secondary | ICD-10-CM

## 2015-01-16 ENCOUNTER — Encounter: Payer: Self-pay | Admitting: Podiatry

## 2015-01-16 NOTE — Progress Notes (Signed)
Patient ID: Roberto Allen, male   DOB: 10/20/1960, 53 y.o.   MRN: [id number]  Subjective: Patient presents the office they for follow-up evaluation of wound on the bottom of his left foot. He states he is doing much better. He gets some occasional bloody/clear drainage but denies any pus and has not been as much. He's finished with antibiotics. He has been using Silvadene over the wound daily. He denies any malodor. No other complaints at this time. Denies any systemic complaints such as fevers, chills, nausea, vomiting. No calf pain, chest pain, she is directed.  Objective: AAO 3, NAD DP/PT pulses palpable 2/4, CRT less than 3 seconds Protective sensation decreased with Simms Weinstein monofilament On the plantar aspect of the left first metatarsal had his annual ulceration with hyperkeratotic periwound. After debridement it the wound measures 1.1 x 0.8 x 0.4 cm with a granular wound base.The wound is superficial except for the lateral portion which has a depth of 0.4 cm. There is no probing, undermining, tunneling. There is no surrounding erythema, ascending cellulitis. There is no significant epidermolysis. Thre is xerotic skin around the plantar 1st interspace. There are no areas of fluctuance or crepitus. No malodor. There is no clinical signs of infection and the wound appears to be healing. There is decreased edema to the left foot. There is no increase in warmth. No other open lesions or pre-ulcerative lesions. There is no pain with calf compression, swelling, warmth, erythema.   Assessment:  Patient presents for follow-up evaluation left  submetatarsal one wound   Plan:.  -Treatment options discussed including all alternatives, risks, and complications -Wound was debrided without complication/bleeding. -Continue with daily dressing changes. Continue with offloading. Recommended to hold off returning to work until the wound heals. However he states that he has to go back to work. I discussed  that if he goes back to work he needs to clean the wound as soon as he gets it with antibacterial soap and apply clean bandage. Most were offloading pads and a wide shoe to help take pressure off the area. If there is any worsening of the wound he is to stop work.  -Monitor for any clinical signs or symptoms of infection and directed to call the office immediately should any occur or go to the ER. -Follow-up in 1 week or sooner if any problems arise. In the meantime, encouraged to call the office with any questions, concerns, change in symptoms.  *x-ray next appointment   Celesta Gentile, DPM

## 2015-01-24 ENCOUNTER — Ambulatory Visit (INDEPENDENT_AMBULATORY_CARE_PROVIDER_SITE_OTHER): Payer: BLUE CROSS/BLUE SHIELD

## 2015-01-24 ENCOUNTER — Ambulatory Visit (INDEPENDENT_AMBULATORY_CARE_PROVIDER_SITE_OTHER): Payer: BLUE CROSS/BLUE SHIELD | Admitting: Podiatry

## 2015-01-24 ENCOUNTER — Encounter: Payer: Self-pay | Admitting: Podiatry

## 2015-01-24 VITALS — BP 117/66 | HR 90 | Resp 18

## 2015-01-24 DIAGNOSIS — L97522 Non-pressure chronic ulcer of other part of left foot with fat layer exposed: Secondary | ICD-10-CM

## 2015-01-24 DIAGNOSIS — L89891 Pressure ulcer of other site, stage 1: Secondary | ICD-10-CM | POA: Diagnosis not present

## 2015-01-24 DIAGNOSIS — R52 Pain, unspecified: Secondary | ICD-10-CM

## 2015-01-30 ENCOUNTER — Encounter: Payer: Self-pay | Admitting: Podiatry

## 2015-01-30 NOTE — Progress Notes (Signed)
Patient ID: Roberto Allen, male   DOB: 03-Oct-1960, 54 y.o.   MRN: [id number]  Subjective: Patient presents the office they for follow-up evaluation of wound on the bottom of his left foot. He states he is doing better  But he does get some bloody to clear drainage coming from the wound but denies any pus, denies any malodor.  He has been using Silvadene over the wound daily.  No other complaints at this time. Denies any systemic complaints such as fevers, chills, nausea, vomiting. No calf pain, chest pain, she is directed.  Objective: AAO 3, NAD DP/PT pulses palpable 2/4, CRT less than 3 seconds Protective sensation decreased with Simms Weinstein monofilament On the plantar aspect of the left first metatarsal had his annual ulceration with hyperkeratotic periwound. After debridement it the wound measures 0.8 x 0.7 x 0.4 cm with a granular wound base.The wound is superficial except for the lateral portion which has a depth of 0.4 cm. There is no probing, undermining, tunneling. There is no surrounding erythema, ascending cellulitis. There is no significant epidermolysis. Thre is xerotic skin around the plantar 1st interspace but improved. There is no areas of fluctuance or crepitus. No malodor. There is no clinical signs of infection and the wound appears to be healing. There is decreased edema to the left foot. There is no increase in warmth. No other open lesions or pre-ulcerative lesions. There is no pain with calf compression, swelling, warmth, erythema.  Assessment: Patient presents for follow-up evaluation left  submetatarsal one wound; healing   Plan: -X-rays were obtained and reviewed with the patient.  -Treatment options discussed including all alternatives, risks, and complications -Wound was debrided without complication/bleeding.  -Continue with daily dressing changes.  -Continue with offloading pads.  -Monitor for any clinical signs or symptoms of infection and directed to call the  office immediately should any occur or go to the ER. -Follow-up as scheduled or sooner if any problems arise. In the meantime, encouraged to call the office with any questions, concerns, change in symptoms.   Celesta Gentile, DPM

## 2015-02-01 ENCOUNTER — Other Ambulatory Visit: Payer: Self-pay | Admitting: Podiatry

## 2015-02-11 ENCOUNTER — Encounter: Payer: Self-pay | Admitting: Podiatry

## 2015-02-11 ENCOUNTER — Ambulatory Visit (INDEPENDENT_AMBULATORY_CARE_PROVIDER_SITE_OTHER): Payer: BLUE CROSS/BLUE SHIELD | Admitting: Podiatry

## 2015-02-11 VITALS — BP 149/88 | HR 81 | Resp 18

## 2015-02-11 DIAGNOSIS — L97522 Non-pressure chronic ulcer of other part of left foot with fat layer exposed: Secondary | ICD-10-CM | POA: Diagnosis not present

## 2015-02-13 ENCOUNTER — Encounter: Payer: Self-pay | Admitting: Podiatry

## 2015-02-13 DIAGNOSIS — M79673 Pain in unspecified foot: Secondary | ICD-10-CM

## 2015-02-13 NOTE — Progress Notes (Signed)
Patient ID: Roberto Allen, male   DOB: 1960/12/26, 54 y.o.   MRN: [id number]  Subjective: Patient presents the office they for follow-up evaluation of wound on the bottom of his left foot. He states he is doing better.  But he does get some bloody to clear drainage coming from the wound but denies any pus, denies any malodor. The amount  Of drainage has decreased. He continues using Silvadene over the wound daily.  No other complaints at this time. Denies any systemic complaints such as fevers, chills, nausea, vomiting. No calf pain, chest pain, she is directed.  Objective: AAO 3, NAD DP/PT pulses palpable 2/4, CRT less than 3 seconds Protective sensation decreased with Simms Weinstein monofilament On the plantar aspect of the left first metatarsal had his annual ulceration with hyperkeratotic periwound. After debridement it the wound measures 0.4x 0.4 x 0.3 m with a granular wound base.The wound is superficial except for the lateral portion which has a depth of 0.3cm. There is no probing, undermining, tunneling. There is no surrounding erythema, ascending cellulitis. There is no significant epidermolysis. Thre is xerotic skin around the plantar 1st interspace but improved. There is no areas of fluctuance or crepitus. No malodor. There is no clinical signs of infection and the wound appears to be healing. There is decreased edema to the left foot. There is no increase in warmth. No other open lesions or pre-ulcerative lesions. There is no pain with calf compression, swelling, warmth, erythema.  Assessment: Patient presents for follow-up evaluation left  submetatarsal one wound; healing   Plan: -X-rays were obtained and reviewed with the patient.  -Treatment options discussed including all alternatives, risks, and complications -Wound was debrided without complication/bleeding to granular wound base.  -Continue with daily dressing changes.  -Continue with offloading pads.  -Monitor for any  clinical signs or symptoms of infection and directed to call the office immediately should any occur or go to the ER. -Follow-up as scheduled or sooner if any problems arise. In the meantime, encouraged to call the office with any questions, concerns, change in symptoms.   Celesta Gentile, DPM

## 2015-03-04 ENCOUNTER — Ambulatory Visit (INDEPENDENT_AMBULATORY_CARE_PROVIDER_SITE_OTHER): Payer: BLUE CROSS/BLUE SHIELD | Admitting: Podiatry

## 2015-03-04 ENCOUNTER — Encounter: Payer: Self-pay | Admitting: Podiatry

## 2015-03-04 VITALS — BP 146/82 | HR 89 | Resp 18

## 2015-03-04 DIAGNOSIS — L97521 Non-pressure chronic ulcer of other part of left foot limited to breakdown of skin: Secondary | ICD-10-CM

## 2015-03-04 DIAGNOSIS — L89891 Pressure ulcer of other site, stage 1: Secondary | ICD-10-CM

## 2015-03-05 ENCOUNTER — Encounter: Payer: Self-pay | Admitting: Podiatry

## 2015-03-05 NOTE — Progress Notes (Signed)
Patient ID: Roberto Allen, male   DOB: [birth year], 55 y.o.   MRN: [id number]  Subjective: Patient presents the office they for follow-up evaluation of wound on the bottom of his left foot. He states he is doing better.  No drainage from the wound. He continues using Silvadene over the wound daily. No other complaints at this time. Denies any systemic complaints such as fevers, chills, nausea, vomiting. No calf pain, chest pain, she is directed.  Objective: AAO 3, NAD DP/PT pulses palpable 2/4, CRT less than 3 seconds Protective sensation decreased with Simms Weinstein monofilament On the plantar aspect of the left first metatarsal had his annual ulceration with hyperkeratotic periwound. After debridement it the wound measures 0.3x 0.2 x 0.1 cm with a granular wound base.The wound is superficial. There is no probing, undermining, tunneling. There is no surrounding erythema, ascending cellulitis. There is no significant epidermolysis. There is no areas of fluctuance or crepitus. No malodor. There is no clinical signs of infection and the wound appears to be healing. There is decreased edema to the left foot. There is no increase in warmth. No other open lesions or pre-ulcerative lesions. There is no pain with calf compression, swelling, warmth, erythema.  Assessment: Patient presents for follow-up evaluation left  submetatarsal one wound; healing  Plan:  -Treatment options discussed including all alternatives, risks, and complications -Wound was debrided without complication/bleeding to granular wound base.  -Continue with daily dressing changes.  -Continue with offloading pads.  -I discussed with him today that I do recommend holding off on working until the wound completely heals however he states that he cannot do this. Continue with aggressive offloading with shoes. -Monitor for any clinical signs or symptoms of infection and directed to call the office immediately should any occur or go to the  ER. -Follow-up as scheduled or sooner if any problems arise. In the meantime, encouraged to call the office with any questions, concerns, change in symptoms.   Celesta Gentile, DPM

## 2015-04-01 ENCOUNTER — Ambulatory Visit: Payer: BLUE CROSS/BLUE SHIELD | Admitting: Podiatry

## 2015-04-04 ENCOUNTER — Ambulatory Visit: Payer: BLUE CROSS/BLUE SHIELD | Admitting: Podiatry

## 2015-04-14 ENCOUNTER — Other Ambulatory Visit: Payer: Self-pay | Admitting: Podiatry

## 2015-04-14 NOTE — Telephone Encounter (Signed)
Pt request refill of Augmentin.  I told pt he would need to have an appt, I would refill until his appt time.  Pt states that is okay he has medication left, and has an appt next week.

## 2015-04-25 ENCOUNTER — Encounter: Payer: Self-pay | Admitting: Podiatry

## 2015-04-25 ENCOUNTER — Ambulatory Visit (INDEPENDENT_AMBULATORY_CARE_PROVIDER_SITE_OTHER): Payer: BLUE CROSS/BLUE SHIELD | Admitting: Podiatry

## 2015-04-25 VITALS — BP 151/82 | HR 79 | Resp 12

## 2015-04-25 DIAGNOSIS — L89891 Pressure ulcer of other site, stage 1: Secondary | ICD-10-CM | POA: Diagnosis not present

## 2015-04-25 DIAGNOSIS — L97521 Non-pressure chronic ulcer of other part of left foot limited to breakdown of skin: Secondary | ICD-10-CM

## 2015-04-26 ENCOUNTER — Encounter: Payer: Self-pay | Admitting: Podiatry

## 2015-04-26 NOTE — Progress Notes (Signed)
Patient ID: Roberto Allen, male   DOB: [birth year], 55 y.o.   MRN: [id number]  Subjective: Patient presents the office they for follow-up evaluation of wound on the bottom of his left foot. He states he is continuing to improve.  No drainage from the wound. He has not been covering the area as he feels the wound is healed. Denies any drainage or pus. No sign of redness or red streaks. Denies any systemic complaints such as fevers, chills, nausea, vomiting. No calf pain, chest pain, she is directed.  Objective: AAO 3, NAD DP/PT pulses palpable 2/4, CRT less than 3 seconds Protective sensation decreased with Simms Weinstein monofilament On the plantar aspect of the left first metatarsal had his annual ulceration with hyperkeratotic periwound. After debridement it the wound measures 0.1x 0.1 x 0.1 cm with a granular wound base and appears to be very small and superficial.there is no probing, undermining or tunneling. There is no swelling erythema, ascending saline disc, fluctuation, crepitus, malodor, drainage or pus. No clinical signs of infection. No other open lesions or pre-ulcerative lesions. There is no pain with calf compression, swelling, warmth, erythema.  Assessment: Patient presents for follow-up evaluation left  submetatarsal one wound; healing  Plan:  -Treatment options discussed including all alternatives, risks, and complications -Wound was debrided without complication/bleeding to granular wound base.  -Continue with daily dressing changes.  -Continue with offloading pads.  -Completed paperwork today for diabetic shoe precertification. -Monitor for any clinical signs or symptoms of infection and directed to call the office immediately should any occur or go to the ER. -Follow-up as scheduled or sooner if any problems arise. In the meantime, encouraged to call the office with any questions, concerns, change in symptoms.   Celesta Gentile, DPM

## 2015-05-12 ENCOUNTER — Other Ambulatory Visit: Payer: Self-pay | Admitting: Podiatry

## 2015-05-23 ENCOUNTER — Encounter: Payer: Self-pay | Admitting: Podiatry

## 2015-05-23 ENCOUNTER — Ambulatory Visit (INDEPENDENT_AMBULATORY_CARE_PROVIDER_SITE_OTHER): Payer: BLUE CROSS/BLUE SHIELD | Admitting: Podiatry

## 2015-05-23 ENCOUNTER — Ambulatory Visit: Payer: BLUE CROSS/BLUE SHIELD | Admitting: Podiatry

## 2015-05-23 VITALS — BP 135/86 | HR 86 | Resp 12

## 2015-05-23 DIAGNOSIS — M79676 Pain in unspecified toe(s): Secondary | ICD-10-CM | POA: Diagnosis not present

## 2015-05-23 DIAGNOSIS — B351 Tinea unguium: Secondary | ICD-10-CM

## 2015-05-23 DIAGNOSIS — E1149 Type 2 diabetes mellitus with other diabetic neurological complication: Secondary | ICD-10-CM

## 2015-05-23 DIAGNOSIS — Q828 Other specified congenital malformations of skin: Secondary | ICD-10-CM

## 2015-05-23 DIAGNOSIS — L84 Corns and callosities: Secondary | ICD-10-CM

## 2015-05-29 ENCOUNTER — Encounter: Payer: Self-pay | Admitting: Podiatry

## 2015-05-29 NOTE — Progress Notes (Signed)
Patient ID: Roberto Allen, male   DOB: [birth year], 55 y.o.   MRN: [id number]  Subjective: Patient presents the office they for follow-up evaluation of wound on the bottom of his left foot. He believes that the area has healed although he is developed shoes. Significant calluses to his right foot. He states he had some bleeding underneath the calluses and the skin got very thick. He has a states his nails are elongated and painful causing pressure in shoes. No surrounding redness or drainage. No other complaints.  Denies any systemic complaints such as fevers, chills, nausea, vomiting. No calf pain, chest pain, she is directed.  Objective: AAO 3, NAD DP/PT pulses palpable 2/4, CRT less than 3 seconds Protective sensation decreased with Simms Weinstein monofilament On the left foot submetatarsal one is hyperkeratotic lesion. Upon debridement there is no underlying ulceration, drainage or signs of infection today. The wound appears to be healed. On the right foot there is an extremely thick hyperkeratotic lesion submetatarsal one as well as the third toe. Upon debridement there is no definitive underlying ulceration although the areas is Pre-ulcerative. There is no swelling erythema, drainage or other signs of infection. No other open lesions or pre-ulcerative lesions. Nails are hypertrophic, dystrophic, brittle, discolored, elongated 10. No swelling erythema or drainage. There is tenderness nails 1-5 bilaterally. No other open lesions or pre-ulcerative lesions. There is no pain with calf compression, swelling, warmth, erythema.  Assessment: Patient presents for follow-up evaluation left  submetatarsal one wound which appears to be healed; pre-ulcerative calluses right foot 2, symptomatic onychomycosis   Plan:  -Treatment options discussed including all alternatives, risks, and complications -Hyperkeratotic lesions debrided 3 without complications or bleeding. Monitor very closely for any  further skin breakdown to call the office and medially should any occur. -Nails debris 10 without complications or bleeding. -Monitor for any clinical signs or symptoms of infection and directed to call the office immediately should any occur or go to the ER. -Follow-up as scheduled or sooner if any problems arise. In the meantime, encouraged to call the office with any questions, concerns, change in symptoms.   Celesta Gentile, DPM

## 2015-06-19 ENCOUNTER — Encounter: Payer: Self-pay | Admitting: Podiatry

## 2015-06-20 ENCOUNTER — Ambulatory Visit (INDEPENDENT_AMBULATORY_CARE_PROVIDER_SITE_OTHER): Payer: BLUE CROSS/BLUE SHIELD | Admitting: Podiatry

## 2015-06-20 ENCOUNTER — Encounter: Payer: Self-pay | Admitting: Podiatry

## 2015-06-20 ENCOUNTER — Ambulatory Visit (INDEPENDENT_AMBULATORY_CARE_PROVIDER_SITE_OTHER): Payer: BLUE CROSS/BLUE SHIELD

## 2015-06-20 VITALS — BP 142/71 | HR 89 | Resp 16

## 2015-06-20 DIAGNOSIS — M79671 Pain in right foot: Secondary | ICD-10-CM

## 2015-06-20 DIAGNOSIS — L89891 Pressure ulcer of other site, stage 1: Secondary | ICD-10-CM

## 2015-06-20 DIAGNOSIS — L97511 Non-pressure chronic ulcer of other part of right foot limited to breakdown of skin: Secondary | ICD-10-CM

## 2015-06-20 MED ORDER — AMOXICILLIN-POT CLAVULANATE 875-125 MG PO TABS: 1.0000 | ORAL_TABLET | Freq: Two times a day (BID) | ORAL | Status: DC

## 2015-06-21 ENCOUNTER — Encounter: Payer: Self-pay | Admitting: Podiatry

## 2015-06-21 NOTE — Progress Notes (Signed)
Patient ID: Roberto Allen, male   DOB: [birth year], 55 y.o.   MRN: [id number]  Subjective: 55 year old male presents the office they for follow up evaluation of ulcer lesions the right foot. He feels that they are better. Has notany drainage, redness, swelling or warmth of his foot. Denies any systemic complaints such as fevers, chills, nausea, vomiting. No acute changes since last appointment, and no other complaints at this time.   Objective: AAO x3, NAD DP/PT pulses palpable bilaterally, CRT less than 3 seconds Protective sensation decreased with Simms Weinstein monofilament Lung right submetatarsal one is a thick hyperkeratotic lesion. Upon debridement today there was some serosanguineous drainage expressed with underlying wound with a granular wound base which did not present to bone there is no undermining or tunneling. The wound measured about 1 x 1 cm. There is no fluctuance or crepitus. Also the distal aspect of the right second toe is a thick hyperkeratotic lesion. Upon debridement small amount of serous drainage was expressed. Upon debridement of the nail did come off and there was a superficial wound of the tip of the toe. There is no drainage or pus otherwise after debridement. There is no edema, erythema to the toe. No ascending cellulitis.  Lesion left foot from prior wound is well-healed with a small hyperkeratotic lesion. No underlying ulceration, drainage or other signs of infection.  Hammertoes  No edema, erythema, increase in warmth to bilateral lower extremities.  No  Otheropen lesions or pre-ulcerative lesions.  No pain with calf compression, swelling, warmth, erythema  Assessment:  ulcerations right foot 2   Plan: -All treatment options discussed with the patient including all alternatives, risks, complications.  -X-rays were obtained and reviewed with the patient. There is some chronic changes of the second toe on the right foot however there is no definitive evidence of  acute osteomyelitis. -Lesions were debrided to granular wound base. Given the drainage will start antibiotics. Prescribed Augmentin. Daily dressing changes. Iodosorb was applied today. Monitor for signs or symptoms of infection. Offloading. Follow-up 2 weeks. Call if questions concerns.  -Patient encouraged to call the office with any questions, concerns, change in symptoms.   Celesta Gentile, DPM

## 2015-07-04 ENCOUNTER — Encounter: Payer: Self-pay | Admitting: Podiatry

## 2015-07-04 ENCOUNTER — Ambulatory Visit (INDEPENDENT_AMBULATORY_CARE_PROVIDER_SITE_OTHER): Payer: BLUE CROSS/BLUE SHIELD | Admitting: Podiatry

## 2015-07-04 VITALS — BP 162/81 | HR 79 | Resp 18

## 2015-07-04 DIAGNOSIS — L84 Corns and callosities: Secondary | ICD-10-CM

## 2015-07-04 DIAGNOSIS — L97511 Non-pressure chronic ulcer of other part of right foot limited to breakdown of skin: Secondary | ICD-10-CM | POA: Diagnosis not present

## 2015-07-04 DIAGNOSIS — E1149 Type 2 diabetes mellitus with other diabetic neurological complication: Secondary | ICD-10-CM | POA: Diagnosis not present

## 2015-07-04 NOTE — Progress Notes (Signed)
Patient ID: Tyleik Livers, male   DOB: [birth year], 55 y.o.   MRN: [id number]  Subjective: 55 year old male presents the office they for follow up evaluation of ulcer lesions the right foot and for superficail wound the right 2nd toe. Denies any drainage or pus. No swelling or redness. He is continued to regular shoe. Denies any systemic complaints such as fevers, chills, nausea, vomiting. No acute changes since last appointment, and no other complaints at this time.   Objective: AAO x3, NAD DP/PT pulses palpable bilaterally, CRT less than 3 seconds Protective sensation decreased with Simms Weinstein monofilament Right foot submetatarsal one hyperkeratotic lesion. No other one ulceration. Superficial granular window distal aspect of the right second toe without any probing, undermining or tunneling. Hyperkeratotic periwound. Granular wound base upon debridement. No probing, undermining or tunneling. Left foot submetatarsal one hyperkeratotic lesion. Upon debridement there is no underlying ulceration at this time and the previous wound appears to be healed. There is no edema to the foot or any erythema or increase in warmth. No fluctuance or crepitus. No malodor. No other open lesions or pre-ulcerative lesions. No pain with calf compression, swelling, warmth, erythema  Assessment: Right distal second toe ulceration due to hammertoe, pre-ulcerative calluses 2  Plan: -All treatment options discussed with the patient including all alternatives, risks, complications.  -Wound debridement right second toe to granular wound base. In the buttock ointment and a dressing was applied. Continue this at home daily. Offloading pads dispensed. -Pre-ulcer lesions debrided 2 without complications or bleeding. -Monitor for any clinical signs or symptoms of infection and directed to call the office immediately should any occur or go to the ER. -Follow-up 3 weeks  or sooner if any problems arise. In the meantime,  encouraged to call the office with any questions, concerns, change in symptoms.   Celesta Gentile, DPM

## 2015-07-25 ENCOUNTER — Encounter: Payer: Self-pay | Admitting: Podiatry

## 2015-07-25 ENCOUNTER — Ambulatory Visit (INDEPENDENT_AMBULATORY_CARE_PROVIDER_SITE_OTHER): Payer: BLUE CROSS/BLUE SHIELD | Admitting: Podiatry

## 2015-07-25 DIAGNOSIS — L84 Corns and callosities: Secondary | ICD-10-CM | POA: Diagnosis not present

## 2015-07-25 DIAGNOSIS — E1149 Type 2 diabetes mellitus with other diabetic neurological complication: Secondary | ICD-10-CM | POA: Diagnosis not present

## 2015-07-25 NOTE — Progress Notes (Signed)
Patient ID: Roberto Allen, male   DOB: 1960/06/06, 55 y.o.   MRN: [id number]  Subjective: 55 year old male presents the office they for follow up evaluation of ulcer lesions the right foot and for superficial wound the right 2nd toe. Denies any drainage or redness or swelling to the areas. He is also presents today to get measured for diabetic shoes. No pain to the feet. No swelling or redness.No acute changes since last appointment, and no other complaints at this time.   Objective: AAO x3, NAD DP/PT pulses palpable bilaterally, CRT less than 3 seconds Protective sensation decreased with Simms Weinstein monofilament Right foot submetatarsal one hyperkeratotic lesion. No ulceration, drainage or signs of infection. Left foot so metatarsal 1 ulceration distal second toe. Upon debridement there was no underlying ulceration, drainage or other signs of infection. No edema, erythema, increase in warmth to bilateral feet. No pain with calf compression, swelling, warmth, erythema  Assessment: Pre-ulcerative callus 3  Plan: -All treatment options discussed with the patient including all alternatives, risks, complications.  -Hyperkeratotic lesions debrided 3 without complications or bleeding. Continue offloading and monitor for any further skin breakdown. -Will see Melody for diabetic shoe measurements -Monitor for any clinical signs or symptoms of infection and directed to call the office immediately should any occur or go to the ER. -Follow-up as scheduled or sooner if any problems arise. In the meantime, encouraged to call the office with any questions, concerns, change in symptoms.   Celesta Gentile, DPM

## 2015-08-15 ENCOUNTER — Ambulatory Visit (INDEPENDENT_AMBULATORY_CARE_PROVIDER_SITE_OTHER): Payer: BLUE CROSS/BLUE SHIELD | Admitting: Podiatry

## 2015-08-15 DIAGNOSIS — M2041 Other hammer toe(s) (acquired), right foot: Secondary | ICD-10-CM

## 2015-08-15 DIAGNOSIS — L84 Corns and callosities: Secondary | ICD-10-CM | POA: Diagnosis not present

## 2015-08-15 DIAGNOSIS — E1149 Type 2 diabetes mellitus with other diabetic neurological complication: Secondary | ICD-10-CM | POA: Diagnosis not present

## 2015-08-15 DIAGNOSIS — M2042 Other hammer toe(s) (acquired), left foot: Secondary | ICD-10-CM | POA: Diagnosis not present

## 2015-08-15 DIAGNOSIS — M2040 Other hammer toe(s) (acquired), unspecified foot: Secondary | ICD-10-CM

## 2015-08-15 DIAGNOSIS — L97521 Non-pressure chronic ulcer of other part of left foot limited to breakdown of skin: Secondary | ICD-10-CM

## 2015-08-15 NOTE — Patient Instructions (Signed)

## 2015-08-15 NOTE — Progress Notes (Signed)
Patient ID: Roberto Allen, male   DOB: 1961-02-04, 55 y.o.   MRN: [id number] Patient presents for diabetic shoe pick up, shoes are tried on for good fit.  Patient received 1 Pair Hushpuppies [id number] Gil Velcro black in men's size 14 med and 3 pairs custom molded diabetic inserts with bilateral unloads 1st met.  Verbal and written break in and wear instructions given.  Patient will follow up for scheduled routine care.

## 2015-08-29 ENCOUNTER — Encounter: Payer: Self-pay | Admitting: Podiatry

## 2015-08-29 ENCOUNTER — Ambulatory Visit (INDEPENDENT_AMBULATORY_CARE_PROVIDER_SITE_OTHER): Payer: BLUE CROSS/BLUE SHIELD | Admitting: Podiatry

## 2015-08-29 VITALS — BP 151/87 | HR 82 | Resp 16

## 2015-08-29 DIAGNOSIS — L84 Corns and callosities: Secondary | ICD-10-CM | POA: Diagnosis not present

## 2015-08-29 DIAGNOSIS — E1149 Type 2 diabetes mellitus with other diabetic neurological complication: Secondary | ICD-10-CM

## 2015-09-07 NOTE — Progress Notes (Signed)
Patient ID: Nekko Coslow, male   DOB: [birth year], 55 y.o.   MRN: [id number]  Subjective: 55 year old male presents the office they for follow up evaluation of  Pre-ulcer lesions the left and right foot. She denies any redness or drainage from any swelling on the sites. No new concerns today no acute changes since last appointment.    Objective: AAO x3, NAD DP/PT pulses palpable bilaterally, CRT less than 3 seconds Protective sensation decreased with Simms Weinstein monofilament Right foot submetatarsal one hyperkeratotic lesion. No ulceration, drainage or signs of infection. Left foot sub-metatarsal 1 anddistal second toe the keratotic lesion.Marland Kitchen Upon debridement there was no underlying ulceration, drainage or other signs of infection. No edema, erythema, increase in warmth to bilateral feet. No pain with calf compression, swelling, warmth, erythema  Assessment: Pre-ulcerative callus 3  Plan: -All treatment options discussed with the patient including all alternatives, risks, complications.  -Hyperkeratotic lesions debrided 3 without complications or bleeding. Continue offloading and monitor for any further skin breakdown. -Monitor for any clinical signs or symptoms of infection and directed to call the office immediately should any occur or go to the ER. -Follow-up as scheduled or sooner if any problems arise. In the meantime, encouraged to call the office with any questions, concerns, change in symptoms.   Celesta Gentile, DPM

## 2015-10-10 ENCOUNTER — Ambulatory Visit: Payer: BLUE CROSS/BLUE SHIELD | Admitting: Podiatry

## 2015-11-14 ENCOUNTER — Ambulatory Visit: Payer: BLUE CROSS/BLUE SHIELD | Admitting: Podiatry

## 2016-01-15 ENCOUNTER — Ambulatory Visit: Payer: BLUE CROSS/BLUE SHIELD | Admitting: Podiatry

## 2016-02-06 DIAGNOSIS — E669 Obesity, unspecified: Secondary | ICD-10-CM | POA: Insufficient documentation

## 2016-02-16 HISTORY — PX: TENOTOMY: SHX397

## 2016-02-20 ENCOUNTER — Ambulatory Visit (INDEPENDENT_AMBULATORY_CARE_PROVIDER_SITE_OTHER): Payer: BLUE CROSS/BLUE SHIELD | Admitting: Podiatry

## 2016-02-20 ENCOUNTER — Encounter: Payer: Self-pay | Admitting: Podiatry

## 2016-02-20 VITALS — BP 159/81 | HR 84 | Temp 97.4°F | Resp 18

## 2016-02-20 DIAGNOSIS — L84 Corns and callosities: Secondary | ICD-10-CM | POA: Diagnosis not present

## 2016-02-20 DIAGNOSIS — E1149 Type 2 diabetes mellitus with other diabetic neurological complication: Secondary | ICD-10-CM | POA: Diagnosis not present

## 2016-02-20 DIAGNOSIS — L97511 Non-pressure chronic ulcer of other part of right foot limited to breakdown of skin: Secondary | ICD-10-CM | POA: Diagnosis not present

## 2016-02-20 MED ORDER — CEPHALEXIN 500 MG PO CAPS: 500.0000 mg | ORAL_CAPSULE | Freq: Three times a day (TID) | ORAL | 2 refills | Status: DC

## 2016-02-20 NOTE — Progress Notes (Signed)
Subjective: 56 year old male presents the office today for concerns of very thick calluses to the right foot on the second toe as well as submetatarsal one. He states that the areas of bigger over the last several months he is concerned for a wound at the had previous in the left foot. His left foot is doing well. He has not noticed any swelling or redness or any drainage or pus coming from the right foot. He is continued to work is wearing diabetic shoes. Denies any systemic complaints such as fevers, chills, nausea, vomiting. No acute changes since last appointment, and no other complaints at this time.   Objective: AAO x3, NAD DP/PT pulses palpable bilaterally, CRT less than 3 seconds There is very thick hyperkeratotic tissue with evidence of dried blood right foot metatarsal 1. Upon debridement there is a superficial granular wound underneath the lesion which measured partly 4 x 3 cm. This was limited to break down of the skin to the subcutaneous tissues tissue. There is no probing, undermining or tunneling. There is no swelling erythema, ascending cellulitis, fluctuance, crepitus, malodor. Also thick hyperkeratotic tissue as well as very thick, discolored toenail to the right second toe. Upon debridement there is no underlying ulceration, drainage or any signs of infection. Again there is no fluctuance or crepitus or malodor. No open lesions or pre-ulcerative lesions.  No pain with calf compression, swelling, warmth, erythema  Assessment: Wound right foot metatarsal 1 with pre-ulcerative callus the second toe  Plan: -All treatment options discussed with the patient including all alternatives, risks, complications.  -Wound right foot shopping debrided to with a scalpel down to healthy tissue. Recommend continue Silvadene dressing changes daily. Offloading pads. Hold off and will back to work until the wound surface heel. Recommended continue with offloading once he does return to work. Also the  right foot second toe was debrided today without complications or bleeding. No and right ulceration however this is pre-ulcerative. Monitor for any further skin breakdown. -Keflex -Follow-up in 2 weeks or sooner if any problems arise. In the meantime, encouraged to call the office with any questions, concerns, change in symptoms.   Roberto Allen, DPM

## 2016-03-05 ENCOUNTER — Ambulatory Visit: Payer: BLUE CROSS/BLUE SHIELD | Admitting: Podiatry

## 2016-03-19 ENCOUNTER — Ambulatory Visit (INDEPENDENT_AMBULATORY_CARE_PROVIDER_SITE_OTHER): Payer: BLUE CROSS/BLUE SHIELD | Admitting: Podiatry

## 2016-03-19 ENCOUNTER — Encounter: Payer: Self-pay | Admitting: Podiatry

## 2016-03-19 ENCOUNTER — Ambulatory Visit: Payer: BLUE CROSS/BLUE SHIELD

## 2016-03-19 ENCOUNTER — Ambulatory Visit (INDEPENDENT_AMBULATORY_CARE_PROVIDER_SITE_OTHER): Payer: BLUE CROSS/BLUE SHIELD

## 2016-03-19 DIAGNOSIS — L02611 Cutaneous abscess of right foot: Secondary | ICD-10-CM

## 2016-03-19 DIAGNOSIS — L97511 Non-pressure chronic ulcer of other part of right foot limited to breakdown of skin: Secondary | ICD-10-CM

## 2016-03-19 DIAGNOSIS — E1149 Type 2 diabetes mellitus with other diabetic neurological complication: Secondary | ICD-10-CM | POA: Diagnosis not present

## 2016-03-19 DIAGNOSIS — L84 Corns and callosities: Secondary | ICD-10-CM

## 2016-03-19 MED ORDER — CLINDAMYCIN HCL 300 MG PO CAPS: 300.0000 mg | ORAL_CAPSULE | Freq: Three times a day (TID) | ORAL | 2 refills | Status: DC

## 2016-03-19 MED ORDER — CIPROFLOXACIN HCL 500 MG PO TABS: 500.0000 mg | ORAL_TABLET | Freq: Two times a day (BID) | ORAL | 0 refills | Status: DC

## 2016-03-22 NOTE — Progress Notes (Addendum)
Subjective: 56 year old male presents the office today for follow-up evaluation of wound of the right foot. He states that he is doing "OK". He denies any swelling or redness or warmth and denies any drainage coming from the wound. He has noticed the wound has become somewhat more sore however. Denies any systemic complaints such as fevers, chills, nausea, vomiting. No acute changes since last appointment, and no other complaints at this time.   He states his blood sugars have been in the 400's given steroids he has been on.   Objective: AAO x3, NAD DP/PT pulses palpable bilaterally, CRT less than 3 seconds There is very thick hyperkeratotic tissue with evidence of dried blood right foot metatarsal 1. Upon debridement there is a superficial granular wound underneath the lesion which measured about the same size at approximately 4 x 3 cm. However today is still to this area is an area of what appears to be a blister. Upon debridement a small amount of pus was expressed. There is no crepitus or fluctuance in this area afterwards. There is a significant amount of macerated tissue along the wound. Also thick hyperkeratotic tissue as well as very thick, discolored toenail to the right second toe. Upon debridement there is no underlying ulceration, drainage or any signs of infection. Again there is no fluctuance or crepitus or malodor. No open lesions or pre-ulcerative lesions.  No pain with calf compression, swelling, warmth, erythema  Assessment: Wound right foot metatarsal with localized infection, pre-ulcerative callus the second toe.  Plan: -All treatment options discussed with the patient including all alternatives, risks, complications.  -Wound right foot shopping debrided to with a scalpel down to healthy tissue. Betadine wet-to-dry dressing changes was applied given the macerated tissue. After debridement there was no further purulence expressed. X-rays were obtained which did not reveal any  soft tissue emphysema or acute osteomyelitis. Recommended return to surgical shoe and offloading the wound better. We'll start clindamycin ciprofloxacin. He can discontinue the other antibiotic. -Hyperkeratotic lesion right second toe was debrided through the complications or bleeding. -Follow-up in 1 week or sooner if needed. Call any questions or concerns in the meantime.  Celesta Gentile, DPM   Recommend continue Silvadene dressing changes daily. Offloading pads. Hold off and will back to work until the wound surface heel. Recommended continue with offloading once he does return to work. Also the right foot second toe was debrided today without complications or bleeding. No and right ulceration however this is pre-ulcerative. Monitor for any further skin breakdown. -Keflex -Follow-up in 2 weeks or sooner if any problems arise. In the meantime, encouraged to call the office with any questions, concerns, change in symptoms.   Celesta Gentile, DPM

## 2016-03-26 ENCOUNTER — Encounter: Payer: Self-pay | Admitting: Podiatry

## 2016-03-26 ENCOUNTER — Other Ambulatory Visit: Payer: Self-pay | Admitting: Podiatry

## 2016-03-26 ENCOUNTER — Ambulatory Visit (INDEPENDENT_AMBULATORY_CARE_PROVIDER_SITE_OTHER): Payer: BLUE CROSS/BLUE SHIELD | Admitting: Podiatry

## 2016-03-26 DIAGNOSIS — L97511 Non-pressure chronic ulcer of other part of right foot limited to breakdown of skin: Secondary | ICD-10-CM | POA: Diagnosis not present

## 2016-03-26 DIAGNOSIS — E1149 Type 2 diabetes mellitus with other diabetic neurological complication: Secondary | ICD-10-CM

## 2016-03-29 NOTE — Progress Notes (Signed)
Subjective: 56 year old male presents the office today for follow-up evaluation of wound of the right foot. He has continued on the clindamycin and ciprofloxacin. He states the wound is doing much better. He has continued with betadine wet to dry dressings. Denies any systemic complaints such as fevers, chills, nausea, vomiting. No calf pain, chest pain, shortness of breath.  Objective: AAO x3, NAD DP/PT pulses palpable bilaterally, CRT less than 3 seconds There is very thick hyperkeratotic tissue with evidence of dried blood right foot metatarsal 1. Upon debridement there is a superficial granular wound underneath the lesion today measuring 1 x 0.5 x 0.1 cm. There is no probing, undermining or tunneling. There is macerated periwound. There is no probing, undermining or tunneling. There is no swelling erythema, ascending cellulitis. There is no fractures or crepitus. There is no malodor. Hyperkeratotic lesion also present at the second toe distally. Upon debridement there was superficial wound in present measuring 0.5 x 0.5 cm. There is no probing, undermining, tunneling. There is mild chronic edema to the second toe. No erythema or increase in warmth. No fluctuance or crepitus. No open lesions or pre-ulcerative lesions.  No pain with calf compression, swelling, warmth, erythema  Assessment: Wound right foot submetatarsal with resolving infection, pre-ulcerative callus the second toe.  Plan: -All treatment options discussed with the patient including all alternatives, risks, complications.  -Wound right foot was sharply  debrided to with a scalpel and tissue nipper down to healthy tissue. Continue with Betadine wet-to-dry dressings daily until the macerated tissue which I explained to him his result. That he can switch to Silvadene cream. -Continue offloading pads. -Finish course of antibiotics which he has been hold off on getting the refill. -Monitor for any signs or symptoms of infection of the  ER should any occur.  -Return to clinic in 2 weeks or sooner if needed.  Celesta Gentile, DPM

## 2016-04-09 DIAGNOSIS — M5136 Other intervertebral disc degeneration, lumbar region: Secondary | ICD-10-CM | POA: Insufficient documentation

## 2016-04-09 DIAGNOSIS — M1611 Unilateral primary osteoarthritis, right hip: Secondary | ICD-10-CM | POA: Insufficient documentation

## 2016-04-09 DIAGNOSIS — M5137 Other intervertebral disc degeneration, lumbosacral region: Secondary | ICD-10-CM | POA: Insufficient documentation

## 2016-04-16 ENCOUNTER — Encounter: Payer: Self-pay | Admitting: Podiatry

## 2016-04-16 ENCOUNTER — Ambulatory Visit (INDEPENDENT_AMBULATORY_CARE_PROVIDER_SITE_OTHER): Payer: BLUE CROSS/BLUE SHIELD | Admitting: Podiatry

## 2016-04-16 VITALS — BP 154/83 | HR 87 | Resp 18

## 2016-04-16 DIAGNOSIS — L84 Corns and callosities: Secondary | ICD-10-CM

## 2016-04-16 DIAGNOSIS — E1149 Type 2 diabetes mellitus with other diabetic neurological complication: Secondary | ICD-10-CM

## 2016-04-16 NOTE — Progress Notes (Signed)
Subjective: 56 year old male presents the office today for follow-up evaluation of wound of the right foot. He has since finished his antibiotics. He states that overall he is doing much better. He has continued with Collagraft dressing changes daily. Denies any swelling, redness, drainage or any pus. Denies any systemic complaints such as fevers, chills, nausea, vomiting. No calf pain, chest pain, shortness of breath.  Objective: AAO x3, NAD DP/PT pulses palpable bilaterally, CRT less than 3 seconds There is very thick hyperkeratotic tissue with evidence of dried blood right foot metatarsal 1. Upon debridement there wound appears to be mostly healed at this time however does pre-ulcerative. There is no definitive ulceration identified today. Along the distal aspect of the second toe there is also hyperkeratotic lesion and on the lateral aspect of the second toe there is an old blister. Upon debridement there is a very superficial area purulence after debridement there is no underlying ulceration and there is no further signs of infection. There is no edema to the toenail is no erythema or increase in warmth there is no ascending cellulitis. There is no fluxions, crepitus, malodor.  Pre-ulcerative hyperkeratotic lesion left foot submetatarsal. Upon debridement there is no underlying ulceration, drainage or any signs of infection. No open lesions or pre-ulcerative lesions.  No pain with calf compression, swelling, warmth, erythema  Assessment: Pre-ulcerative calluses 3  Plan: -All treatment options discussed with the patient including all alternatives, risks, complications.  -Hyperkeratotic lesions were sharply 3 without, occasions or bleeding. At this time there is no definitive ulceration identified have her monitor closely for any skin breakdown. Continue offloading pads. -We'll hold off on further antibiotics as there is no clinical signs of infection at this time. -Monitor for any signs or  symptoms of infection of the ER should any occur.  -Return to clinic in 4 weeks or sooner if needed. Celesta Gentile, DPM

## 2016-04-27 NOTE — Telephone Encounter (Signed)
He can stop antibiotics.

## 2016-05-14 ENCOUNTER — Ambulatory Visit (INDEPENDENT_AMBULATORY_CARE_PROVIDER_SITE_OTHER): Payer: BLUE CROSS/BLUE SHIELD | Admitting: Podiatry

## 2016-05-14 ENCOUNTER — Encounter: Payer: Self-pay | Admitting: Podiatry

## 2016-05-14 DIAGNOSIS — E1149 Type 2 diabetes mellitus with other diabetic neurological complication: Secondary | ICD-10-CM | POA: Diagnosis not present

## 2016-05-14 DIAGNOSIS — L84 Corns and callosities: Secondary | ICD-10-CM

## 2016-05-14 NOTE — Progress Notes (Signed)
Subjective: 56 year old male presents the office today for follow-up evaluation of wound of the right foot. He states the areas been better. His tetanus any redness or drainage or any swelling to the foot. He denies any pain. Denies any systemic complaints such as fevers, chills, nausea, vomiting. No calf pain, chest pain, shortness of breath.  Objective: AAO x3, NAD DP/PT pulses palpable bilaterally, CRT less than 3 seconds Hyperkeratotic tissues present right first metatarsal 1 into the distal aspect of the second digit. This appears to be less hyperkeratotic tissue compared to previous appointment. Upon debridement there is a superficial abrasion type wound to the distal aspect of the second toe however there is no ulceration identified submetatarsal one. There is no drainage or pus there is no clinical signs of infection. Overall areas look much improved. Hammertoes present.  No open lesions or pre-ulcerative lesions.  No pain with calf compression, swelling, warmth, erythema  Assessment: Pre-ulcerative calluses 2  Plan: -All treatment options discussed with the patient including all alternatives, risks, complications.  -Hyperkeratotic lesions were debrided 2 without complications or bleeding. No underlying wound and areas appear to be much improved. Continue daily foot inspection. -Hyperkeratotic lesions were sharply 3 without, occasions or bleeding. At this time there is no definitive ulceration identified have her monitor closely for any skin breakdown. Continue offloading pads. -We'll hold off on further antibiotics as there is no clinical signs of infection at this time. -Monitor for any signs or symptoms of infection of the ER should any occur.  -Return to clinic in 6 weeks or sooner if needed. Celesta Gentile, DPM

## 2016-05-21 ENCOUNTER — Ambulatory Visit: Payer: BLUE CROSS/BLUE SHIELD | Admitting: Podiatry

## 2016-06-25 ENCOUNTER — Ambulatory Visit: Payer: BLUE CROSS/BLUE SHIELD | Admitting: Podiatry

## 2016-06-29 ENCOUNTER — Telehealth: Payer: Self-pay | Admitting: *Deleted

## 2016-06-29 NOTE — Telephone Encounter (Signed)
Received request for refill Clindamycin. Return fax denied pt needs an appt. I spoke with pt and he states he requested the refill accidentally and has an appt in June.

## 2016-07-15 ENCOUNTER — Telehealth: Payer: Self-pay | Admitting: *Deleted

## 2016-07-15 NOTE — Telephone Encounter (Signed)
Pt's wife, Dwana Melena called states she needs to get pt in as soon as possible, his toe is bleeding, and swollen. I spoke with Dwana Melena and she states she called and got an appt for 7:30am tomorrow. I told Leda Gauze, that any time she needed an appt and she didn't have to wait for me, but could go direct to appt line. I told Leda Gauze to have pt soak in 1/2 cup epsom salt to 1 quart warm water 2 x daily and cover with antibiotic ointment dressing until seen tomorrow. Dwana Melena stated she and her husband were at work, and I told her that it would be fine to do the soaks tonight or when they got off work.

## 2016-07-16 ENCOUNTER — Encounter: Payer: Self-pay | Admitting: Podiatry

## 2016-07-16 ENCOUNTER — Ambulatory Visit (INDEPENDENT_AMBULATORY_CARE_PROVIDER_SITE_OTHER): Payer: BLUE CROSS/BLUE SHIELD | Admitting: Podiatry

## 2016-07-16 DIAGNOSIS — L97511 Non-pressure chronic ulcer of other part of right foot limited to breakdown of skin: Secondary | ICD-10-CM

## 2016-07-16 DIAGNOSIS — E1149 Type 2 diabetes mellitus with other diabetic neurological complication: Secondary | ICD-10-CM

## 2016-07-16 MED ORDER — SILVER SULFADIAZINE 1 % EX CREA: 1.0000 "application " | TOPICAL_CREAM | Freq: Every day | CUTANEOUS | 0 refills | Status: DC

## 2016-07-16 MED ORDER — CEPHALEXIN 500 MG PO CAPS: 500.0000 mg | ORAL_CAPSULE | Freq: Three times a day (TID) | ORAL | 2 refills | Status: DC

## 2016-07-16 NOTE — Progress Notes (Signed)
Subjective: 56 year old male presents the also concerns of bleeding to his right second toe was been ongoing the last couple of days. He states he had some Keflex left over from a previous prescription and he has started he is been taking. He denies any recent injury or trauma. He is unsure how this started. Denies any systemic complaints such as fevers, chills, nausea, vomiting. No acute changes since last appointment, and no other complaints at this time.   Objective: AAO x3, NAD DP/PT pulses palpable bilaterally, CRT less than 3 seconds On the right second toe on the plantar aspect is a thick hyperkeratotic lesion to the distal plantar aspect. On the proximal portion of the toe and along the interspace is a serous filled hemorrhagic blister. Upon debridement small amount of clear drainage is expressed and the minimal amount of purulence. After has had cultured this the purulence had dissipated and this would've a superficial wound swab. Upon debridement there is an underlying ulceration distal portion of the toe without any probing, undermining or tunneling. The skin is present underneath the blister. There is no fluctuance or crepitus there is no malodor. Submetatarsal 1 hyperkeratotic lesion bilaterally. Upon debridement no underlying ulceration, drainage or any signs of infection. No open lesions or pre-ulcerative lesions.  No pain with calf compression, swelling, warmth, erythema       Assessment: Ulceration right second toe with localized infection  Plan: -All treatment options discussed with the patient including all alternatives, risks, complications.  -Sharply debrided the lesion to the right second toe the any complications. -Keflex prescribed -Silvadne dressing changes daily. This was applied today -Discussed this is a total knee infection. -Monitor for any clinical signs or symptoms of infection and directed to call the office immediately should any occur or go to the ER. -RTC  as scheduled or sooner if needed.  -Patient encouraged to call the office with any questions, concerns, change in symptoms.   Celesta Gentile, DPM

## 2016-07-22 ENCOUNTER — Telehealth: Payer: Self-pay | Admitting: *Deleted

## 2016-07-22 NOTE — Telephone Encounter (Addendum)
Pt asked if he should lance the build up on his toe. I told pt that would not be good, he needed to be seen. I told pt to tell schedulers that he was diabetic and to get him in sooner than 08/06/2016. Transferred to schedulers. Pt has an appt 07/23/2016 at 7:45am.

## 2016-07-22 NOTE — Telephone Encounter (Signed)
Please have him come in tomorrow (Friday)

## 2016-07-23 ENCOUNTER — Encounter: Payer: Self-pay | Admitting: Podiatry

## 2016-07-23 ENCOUNTER — Ambulatory Visit (INDEPENDENT_AMBULATORY_CARE_PROVIDER_SITE_OTHER): Payer: BLUE CROSS/BLUE SHIELD | Admitting: Podiatry

## 2016-07-23 DIAGNOSIS — K219 Gastro-esophageal reflux disease without esophagitis: Secondary | ICD-10-CM | POA: Insufficient documentation

## 2016-07-23 DIAGNOSIS — T148XXA Other injury of unspecified body region, initial encounter: Secondary | ICD-10-CM

## 2016-07-23 DIAGNOSIS — L03031 Cellulitis of right toe: Secondary | ICD-10-CM

## 2016-07-23 DIAGNOSIS — L02611 Cutaneous abscess of right foot: Secondary | ICD-10-CM | POA: Diagnosis not present

## 2016-07-23 DIAGNOSIS — L97511 Non-pressure chronic ulcer of other part of right foot limited to breakdown of skin: Secondary | ICD-10-CM

## 2016-07-25 NOTE — Progress Notes (Signed)
Subjective: 56 year old male presents the office today for concerns of a blister to the top of his right big toe. He states that he did change she is better weeks ago and he started to blister form on the top of the toe a couple days later. He denies any pus or drainage. He states the right second toe is healing well. He denies any increase in swelling or redness. Denies any systemic complaints such as fevers, chills, nausea, vomiting. No acute changes since last appointment, and no other complaints at this time.   Objective: AAO x3, NAD DP/PT pulses palpable bilaterally, CRT less than 3 seconds Hemorrhagic-appearing blisters present to the dorsal aspect of the right hallux just adjacent to the proximal nail border. Upon drainage of this blood blister drainage was expressed but there is no pus. This area was cultured. There is minimal edema. There is no swelling erythema, ascending cellulitis. On the anterior portion of the right second toe continues to be granulation tissue from with the posterior head and removed this appears to be healing. Overall second toe. The doing much better compared to when I last saw him. There is no erythema or increased warmth the right foot at this time. No areas of fluctuance or crepitus. No malodor. No open lesions or pre-ulcerative lesions.  No pain with calf compression, swelling, warmth, erythema  Assessment: New blister right hallux with healing wound right second toe  Plan: -All treatment options discussed with the patient including all alternatives, risks, complications.  -I did drain the blister to the right second toe and this area was cultured. I did not remove all the surrounding skin. Betadine wet-to-dry dressing changes daily. -Continue daily dressing changes for the second toe as well. This appears be much improved compared to last appointment. -Monitor for any clinical signs or symptoms of infection and directed to call the office immediately should any  occur or go to the ER. -Continue antibiotics -Patient encouraged to call the office with any questions, concerns, change in symptoms.   Celesta Gentile, DPM

## 2016-07-26 LAB — WOUND CULTURE
GRAM STAIN: NONE SEEN
Gram Stain: NONE SEEN
Gram Stain: NONE SEEN
Organism ID, Bacteria: NO GROWTH

## 2016-07-28 ENCOUNTER — Telehealth: Payer: Self-pay | Admitting: *Deleted

## 2016-07-28 NOTE — Telephone Encounter (Addendum)
-----   Message from [name], DPM sent at 07/27/2016  7:14 AM EDT ----- No growth.07/28/2016-Left message to call for results.

## 2016-08-06 ENCOUNTER — Encounter: Payer: Self-pay | Admitting: Podiatry

## 2016-08-06 ENCOUNTER — Ambulatory Visit (INDEPENDENT_AMBULATORY_CARE_PROVIDER_SITE_OTHER): Payer: BLUE CROSS/BLUE SHIELD | Admitting: Podiatry

## 2016-08-06 DIAGNOSIS — E1149 Type 2 diabetes mellitus with other diabetic neurological complication: Secondary | ICD-10-CM | POA: Diagnosis not present

## 2016-08-06 DIAGNOSIS — T148XXA Other injury of unspecified body region, initial encounter: Secondary | ICD-10-CM

## 2016-08-06 NOTE — Progress Notes (Signed)
Subjective: 56 year old male presents the office they for follow-up evaluation of wound of the right second toe as well as blister to the right hallux. He states he is continuing antibiotics. He hasn't changed the bandage daily with Silvadene to the second toe as well as Betadine to the big toe. He has not missed any drainage or pus. He denies any increase in swelling denies any red streaks. He has no new concerns today. Denies any systemic complaints such as fevers, chills, nausea, vomiting. No acute changes since last appointment, and no other complaints at this time.   Objective: AAO x3, NAD DP/PT pulses palpable bilaterally, CRT less than 3 seconds The distal aspect of the right second toe is a hyperkeratotic lesion. Upon debridement there is a macerated periwound of the central granular ulceration. There is no probing to bone, undermining or tunneling. Is no swelling erythema, ascending cellulitis. No edema to the toe. There is no warmth. No malodor. The blister area of the dorsal aspect of the right hallux this area is starting to callus over there is no fluid present. There is no swelling erythema, ascending synovitis. There is no edema to the toe. No open lesions or pre-ulcerative lesions.  No pain with calf compression, swelling, warmth, erythema  Assessment: Healing ulcerations right foot  Plan: -All treatment options discussed with the patient including all alternatives, risks, complications.  -Hyperkeratotic lesions, wounds sharply debrided without any couple occasions or bleeding. Recommended to apply a small amount of Silvadene during the day to the wound. He can hold off on any further antibiotics at this point this is no scleral signs of infection however continue to monitor closely. Continue offloading pads. -Monitor for any clinical signs or symptoms of infection and directed to call the office immediately should any occur or go to the ER. -RTC 3 weeks or sooner if needed.   -Patient encouraged to call the office with any questions, concerns, change in symptoms.   Celesta Gentile, DPM

## 2016-08-13 ENCOUNTER — Ambulatory Visit: Payer: BLUE CROSS/BLUE SHIELD | Admitting: Podiatry

## 2016-08-27 ENCOUNTER — Encounter: Payer: Self-pay | Admitting: Podiatry

## 2016-08-27 ENCOUNTER — Ambulatory Visit (INDEPENDENT_AMBULATORY_CARE_PROVIDER_SITE_OTHER): Payer: BLUE CROSS/BLUE SHIELD | Admitting: Podiatry

## 2016-08-27 DIAGNOSIS — E1149 Type 2 diabetes mellitus with other diabetic neurological complication: Secondary | ICD-10-CM

## 2016-08-27 DIAGNOSIS — L97511 Non-pressure chronic ulcer of other part of right foot limited to breakdown of skin: Secondary | ICD-10-CM

## 2016-08-29 NOTE — Progress Notes (Signed)
Subjective: 56 year old male presents the office they for follow-up evaluation of wound of the right second toe as well as blister to the right hallux. He has finished his course of antibiotics. He states that his wife has been changing the bandage daily and feels that the wound is doing much better. He denies any drainage or pus. His continue with Silvadene dressing changes daily. He continues to get a callus of the distal aspect of the right second toe. Denies any swelling or redness or any drainage or pus. He has no other concerns today. Denies any systemic complaints such as fevers, chills, nausea, vomiting. No acute changes since last appointment, and no other complaints at this time.   Objective: AAO x3, NAD DP/PT pulses palpable bilaterally, CRT less than 3 seconds The distal aspect of the right second toe is a hyperkeratotic lesion. Upon debridement there is a superficial pre-ulcerative lesion identified at this time. There is no drainage or pus in there is no probing, undermining or tunneling. There is no swelling erythema, ascending cellulitis. The other area of the blister as well as on the plantar aspect of the foot on the sulcus the toe is healed. The hallux toenail the right side is somewhat loose there is no drainage or pus there is no clinical signs of infection. He remained of looseness toenail. No pain with calf compression, swelling, warmth, erythema  Assessment: Healing ulcerations right foot  Plan: -All treatment options discussed with the patient including all alternatives, risks, complications.  -Hyperkeratotic lesion sharply debrided without any consultations. Recommended continue small mount of Silvadene dressing changes daily. Overall he is doing better. There is no clinical signs of infection however to monitor closely. Continue the offloading pads. -RTC 3 weeks or sooner if needed.  -Patient encouraged to call the office with any questions, concerns, change in symptoms.    Celesta Gentile, DPM

## 2016-09-17 ENCOUNTER — Encounter: Payer: Self-pay | Admitting: Podiatry

## 2016-09-17 ENCOUNTER — Ambulatory Visit (INDEPENDENT_AMBULATORY_CARE_PROVIDER_SITE_OTHER): Payer: BLUE CROSS/BLUE SHIELD

## 2016-09-17 ENCOUNTER — Ambulatory Visit (INDEPENDENT_AMBULATORY_CARE_PROVIDER_SITE_OTHER): Payer: BLUE CROSS/BLUE SHIELD | Admitting: Podiatry

## 2016-09-17 VITALS — BP 165/84 | HR 88 | Resp 18

## 2016-09-17 DIAGNOSIS — L97519 Non-pressure chronic ulcer of other part of right foot with unspecified severity: Secondary | ICD-10-CM

## 2016-09-17 DIAGNOSIS — R609 Edema, unspecified: Secondary | ICD-10-CM

## 2016-09-17 MED ORDER — CEPHALEXIN 500 MG PO CAPS: 500.0000 mg | ORAL_CAPSULE | Freq: Three times a day (TID) | ORAL | 0 refills | Status: DC

## 2016-09-17 NOTE — Progress Notes (Signed)
Subjective: Chou presents the office today for follow-up evaluation of the wound to the right second toe as well as the foot. He states that he is doing "a lot better". He denies any drainage or pus and denies any open sores. He states that healing has a callus the tip of the second toe. Denies any open sores today. Denies any systemic complaints such as fevers, chills, nausea, vomiting. No acute changes since last appointment, and no other complaints at this time.   Objective: AAO x3, NAD DP/PT pulses palpable bilaterally, CRT less than 3 seconds Sensation decreased with Semmes-Weinstein monofilament Hyperkeratotic lesion present at the distal aspect of the right second toe. Upon debridement superficial abrasion type lesion is present but there is no probing, undermining or tunneling. There is no erythema or increase in warmth. Is no fluctuance covers there is no malodor. The does appear to be some mild increase in edema to the second toe compared to what it has been previously there is no other signs of infection present.  The right hallux toenail is loose and the underlying nail bed and only attachment. Distal portion. This was easily removed without any complications or bleeding. No underlying wound is present.  There are no other open lesions or pre-ulcerative lesions.  There is no pain with calf compression, swelling, warmth, erythema.   Assessment: Healed ulcerations right foot however mild increased swelling to the right second toe   Plan: -Treatment options discussed including all alternatives, risks, and complications -Etiology of symptoms were discussed -X-rays obtained and reviewed the right foot. No definitive evidence of acute osteomyelitis identified this time is no soft tissue emphysema present.  -Sharply debrided hyperkeratotic lesion without any complications. Lachman a small amount of antibiotic ointment to the superficial abrasion type wound daily.  -Given the swelling I did  prescribed Keflex for 1 week. Monitor for any signs or symptoms of worsening infection were to occur.  -Follow up in 2 weeks or sooner if needed. Call any questions concerns meantime.   Celesta Gentile, DPM

## 2016-10-01 ENCOUNTER — Ambulatory Visit: Payer: BLUE CROSS/BLUE SHIELD | Admitting: Podiatry

## 2016-10-08 ENCOUNTER — Encounter: Payer: Self-pay | Admitting: Podiatry

## 2016-10-08 ENCOUNTER — Ambulatory Visit (INDEPENDENT_AMBULATORY_CARE_PROVIDER_SITE_OTHER): Payer: BLUE CROSS/BLUE SHIELD | Admitting: Podiatry

## 2016-10-08 DIAGNOSIS — E1149 Type 2 diabetes mellitus with other diabetic neurological complication: Secondary | ICD-10-CM | POA: Diagnosis not present

## 2016-10-08 DIAGNOSIS — L84 Corns and callosities: Secondary | ICD-10-CM | POA: Diagnosis not present

## 2016-10-08 NOTE — Progress Notes (Signed)
Subjective: Roberto Allen presents the office today for follow-up evaluation of the wound/pre-ulcerative callus to the right second toe as well as the foot. He states he is doing well and is any drainage or pus. Denies any swelling. She is overall he is doing well. He denies any recent skin breakdown. He is currently not taking any antibiotics. Denies any systemic complaints such as fevers, chills, nausea, vomiting. No acute changes since last appointment, and no other complaints at this time.   Objective: AAO x3, NAD DP/PT pulses palpable bilaterally, CRT less than 3 seconds Sensation decreased with Semmes-Weinstein monofilament Hyperkeratotic lesion present at the distal aspect of the right second toe. Upon debridement there is no definitive underlying ulceration identified. The skin is pre-ulcerative. There is no drainage or pus there is no fluctuance or crepitus. There is no malodor. There is no ascending cellulitis. Dry skin is present to the plantar aspect of the foot. No other open lesions or pre-ulcerative lesions identified today. There are no other open lesions or pre-ulcerative lesions.  There is no pain with calf compression, swelling, warmth, erythema.   Assessment: Pre-ulcerative lesion right foot  Plan: -Treatment options discussed including all alternatives, risks, and complications -Lesions the right second toe sharply debrided until any complications or bleeding. The areas pre-ulcerative. Made him an offloading pad helps take pressure off the distal aspect of the toe discussed shoe gear modifications. Also recommended moisturizer to the plantar aspect of the foot. We'll hold off on any further antibiotic solution is no clinical signs of infection. -Given the swelling I did prescribed Keflex for 1 week. Monitor for any signs or symptoms of worsening infection were to occur.  -Follow up in 3 weeks or sooner if needed. Call any questions concerns meantime.   Celesta Gentile, DPM

## 2016-11-05 ENCOUNTER — Ambulatory Visit (INDEPENDENT_AMBULATORY_CARE_PROVIDER_SITE_OTHER): Payer: BLUE CROSS/BLUE SHIELD | Admitting: Podiatry

## 2016-11-05 ENCOUNTER — Encounter: Payer: Self-pay | Admitting: Podiatry

## 2016-11-05 DIAGNOSIS — M79676 Pain in unspecified toe(s): Secondary | ICD-10-CM | POA: Diagnosis not present

## 2016-11-05 DIAGNOSIS — E1149 Type 2 diabetes mellitus with other diabetic neurological complication: Secondary | ICD-10-CM

## 2016-11-05 DIAGNOSIS — L84 Corns and callosities: Secondary | ICD-10-CM | POA: Diagnosis not present

## 2016-11-05 DIAGNOSIS — B351 Tinea unguium: Secondary | ICD-10-CM | POA: Diagnosis not present

## 2016-11-05 NOTE — Progress Notes (Signed)
Subjective: Roberto Allen presents the office today for follow-up evaluation of the wound/pre-ulcerative callus to the right second toe. He states he is doing well he has not had any drainage or pus. He denies any recent swelling or redness to his toe. He also has some thick elongated toenails that cause irritation shoes but denies any redness or drainage or any slanted toenail sites. He has no new concerns. Denies any systemic complaints such as fevers, chills, nausea, vomiting. No acute changes since last appointment, and no other complaints at this time.   Objective: AAO x3, NAD DP/PT pulses palpable bilaterally, CRT less than 3 seconds Sensation decreased with Semmes-Weinstein monofilament Hyperkeratotic lesion present at the distal aspect of the right second toe. Upon debridement there is no underlying ulceration identified. The skin is pre-ulcerative. There is no drainage or pus there is no fluctuance or crepitus. There is no malodor. There is no ascending cellulitis. Semirigid hammertoe contractures present illness his been ongoing issue to the distal aspect of the toe for quite some time.  The left hallux, second, third and the right third, fourth, fifth digit toenail is hypertrophic, dystrophic, discolored. It is causing irritation pain inside shoes. There is no redness or drainage or any swelling.  There are no other open lesions or pre-ulcerative lesions.  There is no pain with calf compression, swelling, warmth, erythema.   Assessment: Pre-ulcerative lesion right foot, symptomatic onychomycosis  Plan: -Treatment options discussed including all alternatives, risks, and complications -A debrided the hyperkeratotic lesion of the distal aspect of the right second toe without any complications. Given the hammertoe contractures as well as his ongoing pre-ulcerative callusing discussed with him options to help minimize the callus.  Discussed tenotomy of the digit to HELP straighten the toe. I next  appointment we'll further discuss this.  -Nails are sharply debrided 6 without any competitions or bleeding  -Daily foot inspection -RTC 3 weeks or sooner if needed.   Celesta Gentile, DPM

## 2016-11-26 ENCOUNTER — Encounter: Payer: Self-pay | Admitting: Podiatry

## 2016-11-26 ENCOUNTER — Ambulatory Visit (INDEPENDENT_AMBULATORY_CARE_PROVIDER_SITE_OTHER): Payer: BLUE CROSS/BLUE SHIELD | Admitting: Podiatry

## 2016-11-26 DIAGNOSIS — E1149 Type 2 diabetes mellitus with other diabetic neurological complication: Secondary | ICD-10-CM

## 2016-11-26 DIAGNOSIS — L84 Corns and callosities: Secondary | ICD-10-CM | POA: Diagnosis not present

## 2016-11-26 DIAGNOSIS — M2041 Other hammer toe(s) (acquired), right foot: Secondary | ICD-10-CM

## 2016-11-26 NOTE — Progress Notes (Signed)
Subjective: Boen presents the office today for follow-up evaluation of the wound/pre-ulcerative callus to the right second toe. He states he is doing well and has not had any increase in swelling or redness or any drainage but the callus does to get very thick. He's been wearing slightly issue. His been thinking about doing the procedure to help straighten the toe with the tenotomy. He was lifting about doing the surrounding Thanksgiving time. He has no concerns today otherwise. Denies any systemic complaints such as fevers, chills, nausea, vomiting. No acute changes since last appointment, and no other complaints at this time.   Objective: AAO x3, NAD DP/PT pulses palpable bilaterally, CRT less than 3 seconds Sensation decreased with Semmes-Weinstein monofilament Hyperkeratotic lesion present at the distal aspect of the right second toe. Upon debridement there is no underlying ulceration identified. The skin is pre-ulcerative. There is no drainage or pus there is no fluctuance or crepitus. There is no malodor. There is no ascending cellulitis. Semirigid hammertoe contractures present illness his been ongoing issue to the distal aspect of the toe for quite some time.  Small hyperkeratotic lesion right second metatarsal 1 without any underlying ulceration drainage or signs of infection There are no other open lesions or pre-ulcerative lesions.  There is no pain with calf compression, swelling, warmth, erythema.   Assessment: Pre-ulcerative lesion right foot,   Plan pre-ulcerative callus: -Treatment options discussed including all alternatives, risks, and complications -A debrided the hyperkeratotic lesion of the distal aspect of the right second toe without any complications. again discussed the flexor tenotomy. He will to do this before Thanksgiving we will plan on doing this on the 21st if he decides to go forward this. -Debrided the hyperkeratotic lesion second metatarsal 1 Silver any  complications or bleeding -Monitor for any clinical signs or symptoms of infection and directed to call the office immediately should any occur or go to the ER. -RTC 3 weeks or sooner if needed.  Celesta Gentile, DPM

## 2016-11-29 DIAGNOSIS — I151 Hypertension secondary to other renal disorders: Secondary | ICD-10-CM | POA: Insufficient documentation

## 2016-11-29 DIAGNOSIS — N2889 Other specified disorders of kidney and ureter: Secondary | ICD-10-CM

## 2016-12-17 ENCOUNTER — Encounter: Payer: Self-pay | Admitting: Podiatry

## 2016-12-17 ENCOUNTER — Ambulatory Visit (INDEPENDENT_AMBULATORY_CARE_PROVIDER_SITE_OTHER): Payer: BLUE CROSS/BLUE SHIELD | Admitting: Podiatry

## 2016-12-17 DIAGNOSIS — L84 Corns and callosities: Secondary | ICD-10-CM

## 2016-12-17 DIAGNOSIS — E1149 Type 2 diabetes mellitus with other diabetic neurological complication: Secondary | ICD-10-CM | POA: Diagnosis not present

## 2016-12-17 DIAGNOSIS — L97519 Non-pressure chronic ulcer of other part of right foot with unspecified severity: Secondary | ICD-10-CM

## 2016-12-17 NOTE — Progress Notes (Signed)
Subjective: Roberto Allen presents the office today for follow-up evaluation of the wound/pre-ulcerative callus to the right second toe. Has a states the callus on the right foot and he points to submetatarsal one is coming thicker. Denies any pain or swelling or drainage or pus. He has been on his feet and he has been wearing his insert. He has no other concerns today. Denies any systemic complaints such as fevers, chills, nausea, vomiting. No acute changes since last appointment, and no other complaints at this time.   Objective: AAO x3, NAD DP/PT pulses palpable bilaterally, CRT less than 3 seconds Sensation decreased with Semmes-Weinstein monofilament Hyperkeratotic lesion present at the distal aspect of the right second toe. Upon debridement there is no underlying ulceration identified. The skin is pre-ulcerative. There is no drainage or pus there is no fluctuance or crepitus. There is no malodor. There is no ascending cellulitis. Semirigid hammertoe contractures present illness his been ongoing issue to the distal aspect of the toe for quite some time.  Hyperkeratotic lesion right foot some metatarsal 1 with evidence of dried blood underneath the callus. Upon debridement there was a wound present measuring/1 x 1 x 0.3 cm. There is no probing, undermining or tunneling. There is no swelling erythema, ascending cellulitis. There is no fractures or crepitus. There is no malodor. There are no other open lesions or pre-ulcerative lesions.  There is no pain with calf compression, swelling, warmth, erythema.   Assessment: Pre-ulcerative lesion right foot, wound submetatarsal 1  Plan pre-ulcerative callus: -Treatment options discussed including all alternatives, risks, and complications -A debrided the hyperkeratotic lesion of the distal aspect of the right second toe without any complications. again discussed the flexor tenotomy. He will to do this before Thanksgiving we will plan on doing this on the 21st  if he decides to go forward this. -Debrided the hyperkeratotic lesion lesion submetatarsal one on the right foot reveal underlying wound. There is also small amount of macerated tissue. Because of this we will do Betadine dressing changes daily. There is no clinical signs of infection or drainage but did not culture the area and there was not an indication for oral antibiotics. -Monitor for any clinical signs or symptoms of infection and directed to call the office immediately should any occur or go to the ER. -RTC 2 weeks or sooner if needed.  Celesta Gentile, DPM

## 2017-01-03 ENCOUNTER — Ambulatory Visit (INDEPENDENT_AMBULATORY_CARE_PROVIDER_SITE_OTHER): Payer: BLUE CROSS/BLUE SHIELD | Admitting: Podiatry

## 2017-01-03 DIAGNOSIS — L84 Corns and callosities: Secondary | ICD-10-CM | POA: Diagnosis not present

## 2017-01-03 DIAGNOSIS — M2041 Other hammer toe(s) (acquired), right foot: Secondary | ICD-10-CM

## 2017-01-03 DIAGNOSIS — E1149 Type 2 diabetes mellitus with other diabetic neurological complication: Secondary | ICD-10-CM | POA: Diagnosis not present

## 2017-01-03 MED ORDER — TRAMADOL HCL 50 MG PO TABS: 50.0000 mg | ORAL_TABLET | Freq: Two times a day (BID) | ORAL | 0 refills | Status: DC | PRN

## 2017-01-03 MED ORDER — CEPHALEXIN 500 MG PO CAPS: 500.0000 mg | ORAL_CAPSULE | Freq: Three times a day (TID) | ORAL | 0 refills | Status: DC

## 2017-01-04 NOTE — Progress Notes (Signed)
Subjective: Roberto Allen presents the office today for follow-up evaluation of the wound, callus of the right second toe.  He states the toe is doing better he has not noticed any drainage or pus or any increase in redness or swelling.  He denies any pain.  He has no other concerns today.  He also presents today for follow-up evaluation of the right submetatarsal wound please feel that this area is healed and there is been no drainage or pus or swelling. Denies any systemic complaints such as fevers, chills, nausea, vomiting. No acute changes since last appointment, and no other complaints at this time.   Objective: AAO x3, NAD DP/PT pulses palpable bilaterally, CRT less than 3 seconds Hammertoe contractures present the right second toe and the distal aspect is a thick hyperkeratotic lesion.  Upon treatment there is no significant wound to the area is pre-ulcerative.  There is no erythema or increase in warmth.  There is no fluctuance or crepitus.  There is no malodor.  There is no clinical signs of infection. Hyperkeratotic lesion submetatarsal 1 right foot.  Upon debridement the wound appears to be healed and there is no underlying ulceration, drainage or any clinical signs of infection present. No open lesions or pre-ulcerative lesions.  No pain with calf compression, swelling, warmth, erythema  Assessment: Healed ulcerations right foot with continued hammertoe deformity  Plan: -All treatment options discussed with the patient including all alternatives, risks, complications.  -Debrided the hyperkeratotic lesions x2 without any complications or bleeding.  The wounds appear to be healed and there is no clinical signs of infection. -At this point we discussed continuing the flexor tenotomy of the right second toe on Wednesday.  We discussed the procedure as well as the postoperative course and he wishes to proceed.  We discussed the chance of infection, recurrence, transfer lesions, loss of toe, delayed  or nonhealing.  We discussed all alternatives, risks, complications.  No promises or guarantees regarding outcome of the procedure and he wishes to proceed with this.  We will plan on doing this in the office on Wednesday under local anesthetic and surgical consent forms were signed. -Patient encouraged to call the office with any questions, concerns, change in symptoms.   [name] DPM

## 2017-01-05 ENCOUNTER — Encounter: Payer: Self-pay | Admitting: Podiatry

## 2017-01-05 ENCOUNTER — Ambulatory Visit (INDEPENDENT_AMBULATORY_CARE_PROVIDER_SITE_OTHER): Payer: BLUE CROSS/BLUE SHIELD | Admitting: Podiatry

## 2017-01-05 VITALS — BP 178/102 | HR 82 | Temp 98.3°F

## 2017-01-05 DIAGNOSIS — M2041 Other hammer toe(s) (acquired), right foot: Secondary | ICD-10-CM | POA: Diagnosis not present

## 2017-01-05 MED ORDER — CEPHALEXIN 500 MG PO CAPS: 500.0000 mg | ORAL_CAPSULE | Freq: Three times a day (TID) | ORAL | 2 refills | Status: DC

## 2017-01-10 ENCOUNTER — Encounter: Payer: Self-pay | Admitting: Podiatry

## 2017-01-10 ENCOUNTER — Ambulatory Visit (INDEPENDENT_AMBULATORY_CARE_PROVIDER_SITE_OTHER): Payer: BLUE CROSS/BLUE SHIELD | Admitting: Podiatry

## 2017-01-10 ENCOUNTER — Ambulatory Visit: Payer: BLUE CROSS/BLUE SHIELD

## 2017-01-10 DIAGNOSIS — L97519 Non-pressure chronic ulcer of other part of right foot with unspecified severity: Secondary | ICD-10-CM

## 2017-01-10 DIAGNOSIS — M2041 Other hammer toe(s) (acquired), right foot: Secondary | ICD-10-CM

## 2017-01-10 NOTE — Progress Notes (Signed)
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  Is attempted numerous conservative treatments including but not limited to shoe gear modifications, offloading and padding without any significant improvement. At this point discussed a tenotomy to help straighten the digit to help prevent further ulceration, infection, amputation.  Discussed this is not a guarantee and he may need to have a full hammertoe repair but we will try a flexor tenotomy.  Alternatives, risks, complications were discussed with patient in detail.  No promises or guarantees were given, the procedure.  All questions were answered to the best of my ability.   Procedure: Flexor Tenotomy Indication for Procedure: toe with semi-reducible hammertoe with distal tip ulceration. Flexor tenotomy indicated to alleviate contracture, reduce pressure, and enhance healing of the ulceration. Location: right 2nd digit Anesthesia: 3 cc 1:1 mix of 2% lidocaine plain and 0.5% Marcaine plain digital block Instrumentation: 15 blade, 4-0 nylon. Technique: The toe was anesthetized as above and prepped in the usual fashion. An 11 blade was then used to make a transverse incision over the plantar aspect of the distal interphalangeal joint. The flexor tendon was incised with noted release of the hammertoe deformity. The skin was friable. The incision was then closed with 4-0 nylon and dressed with sterile dressing. Patient tolerated the procedure well. Dressing: Dry, sterile, compression dressing. Disposition: Patient tolerated procedure well. Patient to return in 1 week for follow-up.  [name] DPM

## 2017-01-12 NOTE — Progress Notes (Signed)
Subjective: Aadil Sur is a 56 y.o. is seen today in office s/p Right 2nd digit flexor tenotomy preformed on 01/05/2017. They state their pain is controlled and he is no longer taking any pain medication. He is remaining the surgical shoe.. Denies any systemic complaints such as fevers, chills, nausea, vomiting. No calf pain, chest pain, shortness of breath.   Objective: General: No acute distress, AAOx3  DP/PT pulses palpable 2/4, CRT < 3 sec to all digits.  Protective sensation intact. Motor function intact.  Right foot: Incision is well coapted without any evidence of dehiscence and sutures are intact. There is no surrounding erythema, ascending cellulitis, fluctuance, crepitus, malodor, drainage/purulence. There is trace edema around the surgical site. There is no pain along the surgical site. The toe does sit in a more rectus position. No other areas of tenderness to bilateral lower extremities.  No other open lesions or pre-ulcerative lesions.  No pain with calf compression, swelling, warmth, erythema.   Assessment and Plan:  Status post right foot surgery, doing well with no complications   -Treatment options discussed including all alternatives, risks, and complications -Incision appears to be healing well. Antibiotic ointment was applied followed by dressing. Keep dressing clean, dry, intact. -In the surgical shoe. Weight-bear as tolerated. -Ice/elevation -Pain medication as needed. -Monitor for any clinical signs or symptoms of infection and DVT/PE and directed to call the office immediately should any occur or go to the ER. -Follow-up in 10 days or sooner if any problems arise. In the meantime, encouraged to call the office with any questions, concerns, change in symptoms.   Celesta Gentile, DPM

## 2017-01-17 ENCOUNTER — Encounter: Payer: BLUE CROSS/BLUE SHIELD | Admitting: Podiatry

## 2017-01-20 ENCOUNTER — Encounter: Payer: Self-pay | Admitting: Podiatry

## 2017-01-20 ENCOUNTER — Ambulatory Visit (INDEPENDENT_AMBULATORY_CARE_PROVIDER_SITE_OTHER): Payer: BLUE CROSS/BLUE SHIELD | Admitting: Podiatry

## 2017-01-20 DIAGNOSIS — E1149 Type 2 diabetes mellitus with other diabetic neurological complication: Secondary | ICD-10-CM | POA: Diagnosis not present

## 2017-01-20 DIAGNOSIS — L84 Corns and callosities: Secondary | ICD-10-CM

## 2017-01-20 DIAGNOSIS — M2041 Other hammer toe(s) (acquired), right foot: Secondary | ICD-10-CM

## 2017-01-24 NOTE — Progress Notes (Signed)
Subjective: Roberto Allen is a 56 y.o. is seen today in office s/p Right 2nd digit flexor tenotomy preformed on 01/05/2017.  And well.  Denies any increase in swelling or denies any drainage or pus or any swelling or redness to his foot.  He has no other concerns today. Denies any systemic complaints such as fevers, chills, nausea, vomiting. No calf pain, chest pain, shortness of breath.   Objective: General: No acute distress, AAOx3  DP/PT pulses palpable 2/4, CRT < 3 sec to all digits.  Protective sensation intact. Motor function intact.  Right foot: Incision is well coapted without any evidence of dehiscence and sutures are intact. There is no surrounding erythema, ascending cellulitis, fluctuance, crepitus, malodor, drainage/purulence. There is trace edema around the surgical site. There is no pain along the surgical site. The toe does sit in a more rectus position. However this is pre-ulcerative.Hyperkeratotic lesion submetatarsal one on the right foot.  No underlying ulceration, drainage or any signs of infection  No other areas of tenderness to bilateral lower extremities.  No other open lesions or pre-ulcerative lesions.  No pain with calf compression, swelling, warmth, erythema.   Assessment and Plan:  Status post right foot surgery, doing well with no complications  -Sutures removed today without any complications antibiotic ointment and a bandage was applied.  Continue this at home as well.  He can start to shower tomorrow.  Also he can slowly start to transition to his regular shoe if able.  He can return to work on Monday and a note was provided for this today. -I sharply debrided the hyperkeratotic pre-ulcerative lesion of the right foot today submetatarsal 1 without any complications or bleeding. -Monitor for any clinical signs or symptoms of infection and directed to call the office immediately should any occur or go to the ER. -Follow-up as scheduled or sooner if  needed.  [name] DPM

## 2017-02-04 ENCOUNTER — Ambulatory Visit (INDEPENDENT_AMBULATORY_CARE_PROVIDER_SITE_OTHER): Payer: BLUE CROSS/BLUE SHIELD | Admitting: Podiatry

## 2017-02-04 ENCOUNTER — Encounter: Payer: Self-pay | Admitting: Podiatry

## 2017-02-04 DIAGNOSIS — E1149 Type 2 diabetes mellitus with other diabetic neurological complication: Secondary | ICD-10-CM

## 2017-02-04 DIAGNOSIS — L84 Corns and callosities: Secondary | ICD-10-CM | POA: Diagnosis not present

## 2017-02-04 DIAGNOSIS — M2041 Other hammer toe(s) (acquired), right foot: Secondary | ICD-10-CM

## 2017-02-09 NOTE — Progress Notes (Signed)
Subjective: Roberto Allen presents the office today for follow-up evaluation of a wound to the distal aspect of the right second toe and a callus submetatarsal one on the right foot.  He states that he is doing very well he recently saw his primary care physician today.  He denies any swelling or redness or drainage to the wounds were callus on the right foot.  Overall he is doing well.  He is been wearing a regular shoe Denies any systemic complaints such as fevers, chills, nausea, vomiting. No acute changes since last appointment, and no other complaints at this time.   Objective: AAO x3, NAD DP/PT pulses palpable bilaterally, CRT less than 3 seconds Although there is still some flexion contracture of the right second toe is more flexible compared to what has been.  Hyperkeratotic tissue to the distal aspect second toes improved and upon debridement there is no underlying ulceration, drainage or any signs of infection noted.  This appears to be improved compared to what it was prior to the procedure.  The metatarsal on the right foot hyperkeratotic lesion submetatarsal 1 without any underlying ulceration, drainage or any signs of infection noted today. No open lesions or pre-ulcerative lesions.  No pain with calf compression, swelling, warmth, erythema  Assessment: Pre-ulcerative calluses right foot x2  Plan: -All treatment options discussed with the patient including all alternatives, risks, complications.  -Hyperkeratotic lesion Sharpy debrided x2 without any complications or bleeding.  Continue offloading pads. -Continue with supportive shoes. -Monitor for any clinical signs or symptoms of infection and directed to call the office immediately should any occur or go to the ER. -Patient encouraged to call the office with any questions, concerns, change in symptoms.   [name] DPM

## 2017-02-18 ENCOUNTER — Encounter: Payer: Self-pay | Admitting: Podiatry

## 2017-02-18 ENCOUNTER — Ambulatory Visit (INDEPENDENT_AMBULATORY_CARE_PROVIDER_SITE_OTHER): Payer: BLUE CROSS/BLUE SHIELD | Admitting: Podiatry

## 2017-02-18 DIAGNOSIS — E1149 Type 2 diabetes mellitus with other diabetic neurological complication: Secondary | ICD-10-CM

## 2017-02-18 DIAGNOSIS — L84 Corns and callosities: Secondary | ICD-10-CM

## 2017-02-18 DIAGNOSIS — M2041 Other hammer toe(s) (acquired), right foot: Secondary | ICD-10-CM

## 2017-02-18 NOTE — Progress Notes (Signed)
Subjective: Roberto Allen presents the office today for follow-up evaluation of pre-ulcerative callus of the distal aspect of the right second toe.  Since he underwent a flexor tenotomy he is doing much better he denies any open sores or any drainage.  He states the callus is more minimal.  He denies any increase in swelling or redness to his toes or feet.  Denies any drainage or pus.  He has no other concerns. Denies any systemic complaints such as fevers, chills, nausea, vomiting. No acute changes since last appointment, and no other complaints at this time.   Objective: AAO x3, NAD DP/PT pulses palpable bilaterally, CRT less than 3 seconds Right second toe appears to be more flexible.  There is a hyperkeratotic lesion to the distal aspect of the toe but appears to be more minimal compared to what it was previously.  Upon debridement there is no underlying ulceration, drainage or any signs of infection noted.  Hyperkeratotic lesion right foot submetatarsal 1 without any underlying ulceration or signs of infection present.  No other open lesions or pre-ulcerative lesion identified today. No open lesions or pre-ulcerative lesions.  No pain with calf compression, swelling, warmth, erythema  Assessment: Pre-ulcerative calluses x2  Plan: -All treatment options discussed with the patient including all alternatives, risks, complications.  -Sharply debrided the hyperkeratotic lesions x2 without any complications or bleeding.  The areas are improving.  I did make him a buttress pad pad to help offload the second toe to see this will take more pressure off the area.  There is no signs of infection we will continue to monitor closely. -Follow-up in 3 weeks or sooner if needed.  Call any questions or concerns meantime.  He agrees this plan has no further questions or concerns. -Patient encouraged to call the office with any questions, concerns, change in symptoms.   [name] DPM

## 2017-03-04 ENCOUNTER — Other Ambulatory Visit: Payer: Self-pay | Admitting: Podiatry

## 2017-03-04 ENCOUNTER — Encounter: Payer: Self-pay | Admitting: Podiatry

## 2017-03-04 ENCOUNTER — Telehealth: Payer: Self-pay | Admitting: *Deleted

## 2017-03-04 ENCOUNTER — Ambulatory Visit (INDEPENDENT_AMBULATORY_CARE_PROVIDER_SITE_OTHER): Payer: BLUE CROSS/BLUE SHIELD

## 2017-03-04 ENCOUNTER — Ambulatory Visit (INDEPENDENT_AMBULATORY_CARE_PROVIDER_SITE_OTHER): Payer: BLUE CROSS/BLUE SHIELD | Admitting: Podiatry

## 2017-03-04 VITALS — BP 142/74 | HR 89 | Resp 16

## 2017-03-04 DIAGNOSIS — L03031 Cellulitis of right toe: Secondary | ICD-10-CM

## 2017-03-04 DIAGNOSIS — M79671 Pain in right foot: Secondary | ICD-10-CM

## 2017-03-04 DIAGNOSIS — M779 Enthesopathy, unspecified: Secondary | ICD-10-CM

## 2017-03-04 DIAGNOSIS — L02611 Cutaneous abscess of right foot: Secondary | ICD-10-CM

## 2017-03-04 DIAGNOSIS — L97511 Non-pressure chronic ulcer of other part of right foot limited to breakdown of skin: Secondary | ICD-10-CM | POA: Diagnosis not present

## 2017-03-04 MED ORDER — CEPHALEXIN 500 MG PO CAPS: 500.0000 mg | ORAL_CAPSULE | Freq: Three times a day (TID) | ORAL | 2 refills | Status: DC

## 2017-03-04 NOTE — Progress Notes (Signed)
  Subjective: Roberto Allen presents the office today for concerns of purulent drainage coming from the right second toe along the lateral aspect.  He states that he saw pus coming out of it on Tuesday he thought that he got it all out but he went to have area checked.  He denies any increase in pain or increase in swelling.  He has been wearing a regular shoe.  His last A1c was 7.1.  He has no other concerns today.  Denies any increase in swelling or redness or warmth to the foot. Denies any systemic complaints such as fevers, chills, nausea, vomiting. No acute changes since last appointment, and no other complaints at this time.   Objective: AAO x3, NAD DP/PT pulses palpable bilaterally, CRT less than 3 seconds Protective sensation decreased with Simms Weinstein monofilament There is a blister present to the lateral aspect of the right second digit toenail.  I was able to debride this and purulent drainage was expressed and the area was cultured.  This does extend towards the distal aspect of the toe as well as into the toenail.  I was able to debride all nonviable tissue this is superficial granular wound present without any probing or, undermining or tunneling.  There is no surrounding erythema.  There is mild edema to the toe and there is no ascending cellulitis.  There is no fluctuation or crepitation.  There is no malodor.  No other open lesions or pre-ulcerative lesions. No pain with calf compression, swelling, warmth, erythema  Assessment: Superficial abscess right second toe  Plan: -All treatment options discussed with the patient including all alternatives, risks, complications.  -X-rays were obtained and reviewed.  There is no definite evidence of cortical destruction suggestive of osteomyelitis.  When you look at on the AP view there are 2 radiolucencies when you look at the oblique view I think this is that radiolucency is corresponding to the ulceration to the distal aspect of the toe but no  definitive cortical destruction. -After verbal consent was obtained the area was cleaned with alcohol I was able to debride all nonviable tissue to healthy, granular tissue with a 312 blade scalpel.  This was completed without any complications and he tolerated well. -Wound culture was obtained of the purulent drainage. -Keflex. -Antibiotic ointment and a bandage to the wound. -Surgical shoe -Monitor for any clinical signs or symptoms of infection and directed to call the office immediately should any occur or go to the ER. -Follow-up in 1 week or sooner if needed.  Call any questions or concerns.  He agrees this plan. -Patient encouraged to call the office with any questions, concerns, change in symptoms.   [name] DPM

## 2017-03-04 NOTE — Telephone Encounter (Signed)
Received refill request. I spoke with pt he states he had a area that he opened with a sterile diabetic needle and he wanted the refill of Cephalexin. Dr. Jacqualyn Posey states get pt in today. I informed pt and transferred to schedulers.

## 2017-03-11 ENCOUNTER — Ambulatory Visit (INDEPENDENT_AMBULATORY_CARE_PROVIDER_SITE_OTHER): Payer: BLUE CROSS/BLUE SHIELD | Admitting: Podiatry

## 2017-03-11 ENCOUNTER — Encounter: Payer: Self-pay | Admitting: Podiatry

## 2017-03-11 DIAGNOSIS — L02611 Cutaneous abscess of right foot: Secondary | ICD-10-CM

## 2017-03-11 DIAGNOSIS — L97511 Non-pressure chronic ulcer of other part of right foot limited to breakdown of skin: Secondary | ICD-10-CM | POA: Diagnosis not present

## 2017-03-11 DIAGNOSIS — L03031 Cellulitis of right toe: Secondary | ICD-10-CM

## 2017-03-13 NOTE — Progress Notes (Signed)
Subjective: Deondrea presents the office today for follow-up evaluation of a superficial abscess of the right second toe.  He states he is doing much better and is continue with antibiotics.  He has been putting a small amount of antibiotic ointment and a bandage over the area daily.  He has not had any further purulence and he denies any redness or red streaks.  He has no other concerns. Denies any systemic complaints such as fevers, chills, nausea, vomiting. No acute changes since last appointment, and no other complaints at this time.   Objective: AAO x3, NAD DP/PT pulses palpable bilaterally, CRT less than 3 seconds To the distal aspect the right second toe is a small amount of hyperkeratotic tissue.  Upon debridement there was a superficial granular wound present but this is minimal.  The area along the lateral aspect of the toe has resolved and there is no further blistering or any drainage or pus identified today.  Mild chronic swelling to the digit but there is no erythema or ascending sialitis.  No fluctuation or crepitation. No edema, erythema, increase in warmth to bilateral lower extremities.  No open lesions or pre-ulcerative lesions.  No pain with calf compression, swelling, warmth, erythema  Assessment: Healing ulceration right second toe without any further purulence.  Plan: -All treatment options discussed with the patient including all alternatives, risks, complications.  -Wound culture was reviewed which not reveal any growth.  Finished course of antibiotics. -Sharply debrided the hyperkeratotic tissue today without any complications or bleeding.  Recommend a small amount of antibiotic ointment and a bandage daily. -Monitor for any clinical signs or symptoms of infection and directed to call the office immediately should any occur or go to the ER. -Follow-up in 2 weeks or sooner as needed.  Call any questions or concerns. -Patient encouraged to call the office with any questions,  concerns, change in symptoms.   *X-ray right foot next appointment  [name] DPM

## 2017-03-18 ENCOUNTER — Ambulatory Visit: Payer: BLUE CROSS/BLUE SHIELD | Admitting: Podiatry

## 2017-03-25 ENCOUNTER — Ambulatory Visit (INDEPENDENT_AMBULATORY_CARE_PROVIDER_SITE_OTHER): Payer: BLUE CROSS/BLUE SHIELD | Admitting: Podiatry

## 2017-03-25 ENCOUNTER — Ambulatory Visit: Payer: BLUE CROSS/BLUE SHIELD | Admitting: Podiatry

## 2017-03-25 ENCOUNTER — Ambulatory Visit (INDEPENDENT_AMBULATORY_CARE_PROVIDER_SITE_OTHER): Payer: BLUE CROSS/BLUE SHIELD

## 2017-03-25 DIAGNOSIS — L97519 Non-pressure chronic ulcer of other part of right foot with unspecified severity: Secondary | ICD-10-CM | POA: Diagnosis not present

## 2017-03-25 DIAGNOSIS — L97511 Non-pressure chronic ulcer of other part of right foot limited to breakdown of skin: Secondary | ICD-10-CM

## 2017-03-25 DIAGNOSIS — Q828 Other specified congenital malformations of skin: Secondary | ICD-10-CM | POA: Diagnosis not present

## 2017-03-25 DIAGNOSIS — E1149 Type 2 diabetes mellitus with other diabetic neurological complication: Secondary | ICD-10-CM

## 2017-03-25 NOTE — Progress Notes (Signed)
Subjective: Roberto Allen presents the office today for follow-up evaluation of a superficial abscess of the right second toe. He states that the toe is doing well and he has not had any drainage, pus or increase in swelling to the toe or foot. He is off of antibiotics. He has no other concerns today. Denies any systemic complaints such as fevers, chills, nausea, vomiting. No acute changes since last appointment, and no other complaints at this time.   Objective: AAO x3, NAD DP/PT pulses palpable bilaterally, CRT less than 3 seconds To the distal aspect the right second toe is a small amount of hyperkeratotic tissue.  Upon debridement the underlying ulceration appears to be improved. There is no drainage or pus. There is no fluctuance or crepitance.  No malodor.  There is chronic swelling to the digit.  This is a hyperkeratotic lesion right foot submetatarsal 1.  Upon debridement there is no underlying ulceration, drainage or any clinical signs of infection noted today.  No other open lesions or pre-ulcerative lesions.  There is no increase in swelling or redness or warmth of the foot. There is no pain with calf compression, swelling, warmth, erythema.  Assessment: Healing ulceration right second toe without any further purulence.  Plan: -All treatment options discussed with the patient including all alternatives, risks, complications.  --X-rays were obtained and reviewed with the patient. No definitive evidence of acute osteomyelitis. No soft tissue emphysema. Digital deformities are present so this limits evaluation.  -Debrided the hyperkeratotic lesion to the distal aspect right second toe without any complications.  There is a small superficial abrasion type lesion present.  Continue small amount of antibiotic ointment and bandage.  Given the chronic swelling to the toe I did order blood work today including ESR, CRP, CBC.  No clinical signs of infection other than the swelling which is been chronic  swelling to hold off any further oral antibiotics at this time. -I debrided the hyperkeratotic lesion to the right submetatarsal 1 without any complications or bleeding. -Monitor for any clinical signs or symptoms of infection and directed to call the office immediately should any occur or go to the ER. -Follow-up in 2-3 weeks or sooner if needed.  Call any questions or concerns meantime.  He agrees with this plan has no further questions or concerns today.  [name] DPM

## 2017-03-26 LAB — C-REACTIVE PROTEIN: CRP: 8.9 mg/L — ABNORMAL HIGH (ref <8.0)

## 2017-03-26 LAB — SEDIMENTATION RATE: SED RATE: 31 mm/h — AB (ref 0–20)

## 2017-03-26 LAB — CBC WITH DIFFERENTIAL/PLATELET
BASOS ABS: 39 {cells}/uL (ref 0–200)
Basophils Relative: 0.5 %
EOS PCT: 2.9 %
Eosinophils Absolute: 226 cells/uL (ref 15–500)
HCT: 34.5 % — ABNORMAL LOW (ref 38.5–50.0)
HEMOGLOBIN: 11.8 g/dL — AB (ref 13.2–17.1)
Lymphs Abs: 1771 cells/uL (ref 850–3900)
MCH: 29.3 pg (ref 27.0–33.0)
MCHC: 34.2 g/dL (ref 32.0–36.0)
MCV: 85.6 fL (ref 80.0–100.0)
MONOS PCT: 7.6 %
MPV: 8.6 fL (ref 7.5–12.5)
NEUTROS ABS: 5171 {cells}/uL (ref 1500–7800)
Neutrophils Relative %: 66.3 %
Platelets: 426 10*3/uL — ABNORMAL HIGH (ref 140–400)
RBC: 4.03 10*6/uL — ABNORMAL LOW (ref 4.20–5.80)
RDW: 13 % (ref 11.0–15.0)
Total Lymphocyte: 22.7 %
WBC mixed population: 593 cells/uL (ref 200–950)
WBC: 7.8 10*3/uL (ref 3.8–10.8)

## 2017-03-26 LAB — HEMOGLOBIN A1C
EAG (MMOL/L): 8.9 (calc)
HEMOGLOBIN A1C: 7.2 %{Hb} — AB (ref <5.7)
Mean Plasma Glucose: 160 (calc)

## 2017-03-29 ENCOUNTER — Telehealth: Payer: Self-pay | Admitting: *Deleted

## 2017-03-29 NOTE — Telephone Encounter (Signed)
I informed pt of Dr. Wagoner's review of results and orders. 

## 2017-03-29 NOTE — Telephone Encounter (Signed)
-----   Message from [name], DPM sent at 03/29/2017  6:41 AM EST ----- Please let him know that his inflammation markers are elevated but WBC is normal; will recheck blood work next appointment.

## 2017-04-15 ENCOUNTER — Encounter: Payer: Self-pay | Admitting: Podiatry

## 2017-04-15 ENCOUNTER — Ambulatory Visit (INDEPENDENT_AMBULATORY_CARE_PROVIDER_SITE_OTHER): Payer: BLUE CROSS/BLUE SHIELD | Admitting: Podiatry

## 2017-04-15 DIAGNOSIS — L97511 Non-pressure chronic ulcer of other part of right foot limited to breakdown of skin: Secondary | ICD-10-CM | POA: Diagnosis not present

## 2017-04-15 DIAGNOSIS — E1149 Type 2 diabetes mellitus with other diabetic neurological complication: Secondary | ICD-10-CM

## 2017-04-15 DIAGNOSIS — Q828 Other specified congenital malformations of skin: Secondary | ICD-10-CM | POA: Diagnosis not present

## 2017-04-19 NOTE — Progress Notes (Signed)
Subjective: Roberto Allen presents the office today for follow-up evaluation of a superficial abscess of the right second toe. He states that he is doing better. He still have a callus to the tip of the toe and a small wound. His wife does change the bandage daily. He has been wearing a regular shoe. He denies any drainage or pus or any increase in swelling or red streaks.  He has no other concerns today. Denies any systemic complaints such as fevers, chills, nausea, vomiting. No acute changes since last appointment, and no other complaints at this time.   Objective: AAO x3, NAD DP/PT pulses palpable bilaterally, CRT less than 3 seconds To the distal aspect the right second toe is a small amount of hyperkeratotic tissue.  Upon debridement there is a small superficial ulceration measuring approximately 0.3 x 0.3 x 0.1 cm with a granular wound base.  There is no probing, undermining or tunneling.  There is no surrounding erythema, ascending cellulitis.  There is no fluctuation or crepitation.  There is no malodor.  Small old blister to the lateral aspect of the second toe.  Upon debridement of this epidermal lysis there is no open sore identified a new skin was present.  There is mild chronic swelling to the second toe.  On the right foot metatarsal one is pre-ulcerative callus.  Upon debridement there is no underlying ulceration, drainage or any signs of infection.  No other open lesions or pre-ulcerative lesions are identified today. There is no pain with calf compression, swelling, warmth, erythema.  Assessment: Healing ulceration right second toe   Plan: -All treatment options discussed with the patient including all alternatives, risks, complications.  -Sharply debrided the hyperkeratotic lesion the ulcer of the right second toe without any complications or bleeding.  There is no clinical signs of infection other than some mild chronic swelling which is remained.  There is no purulence.  Continue the small  amount of antibiotic ointment dressing changes daily.  Offloading at all times.  Sharply debrided the hyperkeratotic lesion right submetatarsal 1.  This was completed without any comp occasions or bleeding. -Monitor for any clinical signs or symptoms of infection and directed to call the office immediately should any occur or go to the ER. -RTC once he gets back from vacation or sooner if any issues are to arise.   [name] DPM

## 2017-04-28 ENCOUNTER — Ambulatory Visit (INDEPENDENT_AMBULATORY_CARE_PROVIDER_SITE_OTHER): Payer: BLUE CROSS/BLUE SHIELD | Admitting: Podiatry

## 2017-04-28 ENCOUNTER — Encounter: Payer: Self-pay | Admitting: Podiatry

## 2017-04-28 DIAGNOSIS — L84 Corns and callosities: Secondary | ICD-10-CM

## 2017-04-28 DIAGNOSIS — M2041 Other hammer toe(s) (acquired), right foot: Secondary | ICD-10-CM | POA: Diagnosis not present

## 2017-04-28 DIAGNOSIS — L97511 Non-pressure chronic ulcer of other part of right foot limited to breakdown of skin: Secondary | ICD-10-CM

## 2017-04-28 DIAGNOSIS — E1149 Type 2 diabetes mellitus with other diabetic neurological complication: Secondary | ICD-10-CM

## 2017-05-01 NOTE — Progress Notes (Signed)
Subjective: Roberto Allen presents the office today for follow-up evaluation of a superficial abscess of the right second toe.  He states his wife continues to put a bandage on the area daily.  He has not noticed an increase in swelling he denies any redness he denies any drainage or pus.  He continues to wear regular shoe.  He is also inquiring today about getting new diabetic shoes and inserts.  He has no other concerns today. Denies any systemic complaints such as fevers, chills, nausea, vomiting. No acute changes since last appointment, and no other complaints at this time.   Objective: AAO x3, NAD DP/PT pulses palpable bilaterally, CRT less than 3 seconds To the distal aspect the right second toe is a small amount of hyperkeratotic tissue.  Upon debridement today there is no definitive underlying ulceration identified today it appears that the wound is healed.  There is some chronic edema to the second toe but there is no erythema or increase in warmth.  There is no drainage or pus.  There is no area probing.  There is no redness or warmth from the toe.  There is no blistering. Hammertoe contractures are present to the other digits. Right foot submetatarsal one hyperkeratotic lesion without any underlying ulceration, drainage or any clinical signs of infection. There is no pain with calf compression, swelling, warmth, erythema.  Assessment: Healing ulceration right second toe   Plan: -All treatment options discussed with the patient including all alternatives, risks, complications.  -I sharply debrided the hyperkeratotic lesion today without any application of bleeding.  The wound to the second toe appears to be healed there is no clinical signs of infection present.  Continue with offloading.  He is a small amount of moisturizer area daily for small amount of antibiotic ointment. -Paperwork was completed for precertification of diabetic shoes.  Given history of ulceration, pre-ulcerative lesions as  well as digital deformity as well as neuropathy I think you be a good candidate for diabetic shoes.  I will have him follow with [facility] for this. -RTC 3 weeks or sooner if needed.   [name] DPM

## 2017-05-17 ENCOUNTER — Other Ambulatory Visit: Payer: BLUE CROSS/BLUE SHIELD

## 2017-05-20 ENCOUNTER — Ambulatory Visit (INDEPENDENT_AMBULATORY_CARE_PROVIDER_SITE_OTHER): Payer: BLUE CROSS/BLUE SHIELD | Admitting: Podiatry

## 2017-05-20 ENCOUNTER — Encounter: Payer: Self-pay | Admitting: Podiatry

## 2017-05-20 VITALS — BP 152/79 | HR 80 | Resp 16

## 2017-05-20 DIAGNOSIS — E1149 Type 2 diabetes mellitus with other diabetic neurological complication: Secondary | ICD-10-CM | POA: Diagnosis not present

## 2017-05-20 DIAGNOSIS — Q828 Other specified congenital malformations of skin: Secondary | ICD-10-CM | POA: Diagnosis not present

## 2017-05-20 DIAGNOSIS — L97511 Non-pressure chronic ulcer of other part of right foot limited to breakdown of skin: Secondary | ICD-10-CM | POA: Diagnosis not present

## 2017-05-22 NOTE — Progress Notes (Signed)
Subjective: Derl presents the office today for follow-up evaluation of a a recurrent wound to the right second toe as well as submetatarsal 1.  He denies any drainage or pus and denies any swelling or redness to his feet.  His wife continues to put a bandage on the area daily.  He has not had any drainage or minimal drainage that he has noticed.  He does notice drainage is more bloody there is no pus. Denies any systemic complaints such as fevers, chills, nausea, vomiting. No acute changes since last appointment, and no other complaints at this time.   Objective: AAO x3, NAD DP/PT pulses palpable bilaterally, CRT less than 3 seconds Hyperkeratotic tissue to the distal aspect of the right second toe.  Upon debridement there is a small superficial wound to the distal portion of the toe and there is no probing, undermining or tunneling.  There is no surrounding erythema, ascending cellulitis.  There is no fluctuation or crepitation.  There is no malodor.  Also hyperkeratotic pre-ulcerative lesion right submetatarsal 1 without any underlying ulceration drainage or any clinical signs of infection present. Mild chronic swelling to the right second toe No other open lesions or pre-ulcerative lesions. There is no pain with calf compression, swelling, warmth, erythema.  Assessment: Chronic ulceration right second toe; pre-ulcerative callus right submetatarsal 1  Plan: -All treatment options discussed with the patient including all alternatives, risks, complications.  -Mild recurrence of the wound of the right second toe there is no clinical signs of infection.  Continue antibiotic ointment dressing changes daily.  Continue offloading at all times. -Sharply debrided the hyperkeratotic lesion x1 without any complications or bleeding to right submetatarsal 1. -Monitor for any clinical signs or symptoms of infection and directed to call the office immediately should any occur or go to the ER. -RTC 2 weeks or  sooner if needed.  [name] DPM

## 2017-05-23 ENCOUNTER — Telehealth: Payer: Self-pay | Admitting: Podiatry

## 2017-05-23 NOTE — Telephone Encounter (Signed)
Called pt regarding diabetic shoes/inserts. His insurance covers 80% after meeting 1500.00 deductible and pt has only met 321.50.  Pt has decided to hold off on getting shoes until he meets deductible.

## 2017-06-10 ENCOUNTER — Encounter: Payer: Self-pay | Admitting: Podiatry

## 2017-06-10 ENCOUNTER — Ambulatory Visit (INDEPENDENT_AMBULATORY_CARE_PROVIDER_SITE_OTHER): Payer: BLUE CROSS/BLUE SHIELD | Admitting: Podiatry

## 2017-06-10 DIAGNOSIS — Q828 Other specified congenital malformations of skin: Secondary | ICD-10-CM

## 2017-06-10 DIAGNOSIS — E1149 Type 2 diabetes mellitus with other diabetic neurological complication: Secondary | ICD-10-CM

## 2017-06-10 DIAGNOSIS — L97511 Non-pressure chronic ulcer of other part of right foot limited to breakdown of skin: Secondary | ICD-10-CM

## 2017-06-12 NOTE — Progress Notes (Signed)
Subjective: Roberto Allen presents the office today for follow-up evaluation of a a recurrent wound to the right second toe as well as submetatarsal 1.  He states he is doing well he has not had any increase in swelling or redness.  A small amount of bloody drainage earlier in the week but denies any pus.  Denies any pain.  His wife change the bandages daily.  He is continue to wear regular shoe.  No other concerns today. Denies any systemic complaints such as fevers, chills, nausea, vomiting.    Objective: AAO x3, NAD DP/PT pulses palpable bilaterally, CRT less than 3 seconds Hyperkeratotic tissue to the distal aspect of the right second toe.  Upon debridement today there was a superficial abrasion type lesion to the distal portion of the toe.  There is no probing, undermining or tunneling.  There is no surrounding erythema, ascending cellulitis.  There is no fluctuation or crepitation.  There is no malodor.  Mild chronic swelling to the toe. On the right foot the metatarsal was hyperkeratotic lesion but after debridement there is no underlying ulceration drainage or any clinical signs of infection noted today. No other open lesions or pre-ulcerative lesions. There is no pain with calf compression, swelling, warmth, erythema.  Assessment: Chronic ulceration right second toe; pre-ulcerative callus right submetatarsal 1  Plan: -All treatment options discussed with the patient including all alternatives, risks, complications.  -I sharply debrided the wound to the right second toe today without any complications to healthy, granular tissue.  Appears to be superficial.  Continue daily dressing changes with a small amount of Betadine to the area daily.  Although it is better than what it was prior to the tenotomy I did discuss possible arthroplasty of the toe to help take pressure off the distal portion. -Sharply debrided the hyperkeratotic lesion right foot submetatarsal 1 today without any complications or  bleeding.  Continue offloading to both areas. -Follow-up in 2 weeks or sooner if needed.  [name] DPM

## 2017-06-24 ENCOUNTER — Ambulatory Visit (INDEPENDENT_AMBULATORY_CARE_PROVIDER_SITE_OTHER): Payer: BLUE CROSS/BLUE SHIELD | Admitting: Podiatry

## 2017-06-24 ENCOUNTER — Encounter: Payer: Self-pay | Admitting: Podiatry

## 2017-06-24 ENCOUNTER — Ambulatory Visit (INDEPENDENT_AMBULATORY_CARE_PROVIDER_SITE_OTHER): Payer: BLUE CROSS/BLUE SHIELD

## 2017-06-24 DIAGNOSIS — M79676 Pain in unspecified toe(s): Secondary | ICD-10-CM | POA: Diagnosis not present

## 2017-06-24 DIAGNOSIS — B351 Tinea unguium: Secondary | ICD-10-CM | POA: Diagnosis not present

## 2017-06-24 DIAGNOSIS — L97511 Non-pressure chronic ulcer of other part of right foot limited to breakdown of skin: Secondary | ICD-10-CM | POA: Diagnosis not present

## 2017-06-24 DIAGNOSIS — M869 Osteomyelitis, unspecified: Secondary | ICD-10-CM | POA: Diagnosis not present

## 2017-06-24 DIAGNOSIS — E1149 Type 2 diabetes mellitus with other diabetic neurological complication: Secondary | ICD-10-CM

## 2017-06-24 MED ORDER — DOXYCYCLINE HYCLATE 100 MG PO TABS: 100.0000 mg | ORAL_TABLET | Freq: Two times a day (BID) | ORAL | 0 refills | Status: DC

## 2017-06-24 NOTE — Progress Notes (Signed)
Subjective: Roberto Allen presents the office today for follow-up evaluation of a a recurrent wound to the right second toe.  He states is doing better he has not had any drainage.  Is not on any antibiotics.  He also is asking for the left big toenail to be trimmed but is very thick and discolored he is also had some thickening discoloration of his other toenails and his wife does not want to trim them but is causing irritation set issues.  He has no other concerns. Denies any systemic complaints such as fevers, chills, nausea, vomiting.    Objective: AAO x3, NAD DP/PT pulses palpable bilaterally, CRT less than 3 seconds Hyperkeratotic tissue to the distal aspect of the right second toe.  Upon debridement there is a central granular wound measuring approximately 0.3 x 0.3 cm.  There is no probing, undermining or tunneling.  Overall it appears to be better than what it has been.  There is hyperkeratotic periwound.  There is mild chronic swelling to the distal portion of the toe but there is no erythema or increase in warmth.  There is no ascending cellulitis.  There is no fluctuation or crepitation.  There is no malodor. His nails hypertrophic, dystrophic, discolored with ill-defined discoloration.  There is no surrounding redness or drainage from the toenail sites.  There is subjective tenderness nails 1-5 bilaterally most notably his left hallux toenail. No other open lesions or pre-ulcerative lesions. There is no pain with calf compression, swelling, warmth, erythema.  Assessment: Chronic ulceration right second toe, concern for onychomycosis; symptomatic onychomycosis  Plan: -All treatment options discussed with the patient including all alternatives, risks, complications.  -X-rays were obtained and reviewed.  The oblique view there is questionable cortical changes suggestive of osteomyelitis which is needed for the last appointment.  There is no soft tissue emphysema.  This is localized to the distal  aspect of the distal phalanx. -Today the wound was sharply debrided without any complications to healthy, granular tissue utilizing a #312 blade.  Continue small amount of Silvadene to the wound daily.  Offloading at all times.  Given the new x-ray findings will start doxycycline case of infection also I discussed with him further surgery however he states that he cannot do this time due to surgery he cannot take any time off work.  Monitor closely for any signs or symptoms of infection to call the office immediately should any occur or go to the ER. -Nails debrided x10 without any complications or bleeding  [name] DPM   -I sharply debrided the wound to the right second toe today without any complications to healthy, granular tissue.  Appears to be superficial.  Continue daily dressing changes with a small amount of Betadine to the area daily.  Although it is better than what it was prior to the tenotomy I did discuss possible arthroplasty of the toe to help take pressure off the distal portion. -Sharply debrided the hyperkeratotic lesion right foot submetatarsal 1 today without any complications or bleeding.  Continue offloading to both areas. -Follow-up in 2 weeks or sooner if needed.  [name] DPM

## 2017-07-08 ENCOUNTER — Encounter: Payer: Self-pay | Admitting: Podiatry

## 2017-07-08 ENCOUNTER — Ambulatory Visit (INDEPENDENT_AMBULATORY_CARE_PROVIDER_SITE_OTHER): Payer: BLUE CROSS/BLUE SHIELD | Admitting: Podiatry

## 2017-07-08 DIAGNOSIS — L97511 Non-pressure chronic ulcer of other part of right foot limited to breakdown of skin: Secondary | ICD-10-CM | POA: Diagnosis not present

## 2017-07-08 DIAGNOSIS — E1149 Type 2 diabetes mellitus with other diabetic neurological complication: Secondary | ICD-10-CM

## 2017-07-12 NOTE — Progress Notes (Signed)
Subjective: Roberto Allen presents the office today for follow-up evaluation of a chronic ulcerations right second toe. He states his wife continues to change the bandage daily.  He states that overall he is doing well he has not had any pus and he gets only very minimal amount of bloody drainage on the bandage when the dressing is changed.  Denies any increase in swelling denies any redness or warmth to his feet.  He has no other concerns today. Denies any systemic complaints such as fevers, chills, nausea, vomiting.    Objective: AAO x3, NAD DP/PT pulses palpable bilaterally, CRT less than 3 seconds Hyperkeratotic tissue to the distal aspect of the right second toe.  Upon debridement there is a central granular wound measuring approximately 0.2 x 0.1 cm.  There is mild chronic swelling to the distal portion of the toe.  There is no probing, undermining or tunneling.  There is no erythema, ascending cellulitis.  There is no fluctuation or crepitation.  No malodor.  No other open lesions identified.  Minimal hyperkeratotic tissue right submetatarsal 1 without any edema, erythema. No other open lesions or pre-ulcerative lesions. There is no pain with calf compression, swelling, warmth, erythema.  Assessment: Chronic ulceration right second toe  Plan: -All treatment options discussed with the patient including all alternatives, risks, complications -The wound was sharply debrided to the any complications to healthy, granular tissue.  There is no probing to bone.  Continue offloading at all times as well as daily dressing changes.  We discussed further surgery but given his work schedule he cannot take time off work.  Monitor for any clinical signs or symptoms of infection and directed to call the office immediately should any occur or go to the ER.  *X-ray right foot next appointment, blood work likely next appointment as well (ESR, CRP, CBC, BMP)  [name] DPM

## 2017-07-22 ENCOUNTER — Ambulatory Visit (INDEPENDENT_AMBULATORY_CARE_PROVIDER_SITE_OTHER): Payer: BLUE CROSS/BLUE SHIELD | Admitting: Podiatry

## 2017-07-22 ENCOUNTER — Encounter: Payer: Self-pay | Admitting: Podiatry

## 2017-07-22 VITALS — Temp 99.1°F

## 2017-07-22 DIAGNOSIS — L97511 Non-pressure chronic ulcer of other part of right foot limited to breakdown of skin: Secondary | ICD-10-CM | POA: Diagnosis not present

## 2017-07-22 DIAGNOSIS — L84 Corns and callosities: Secondary | ICD-10-CM

## 2017-07-22 DIAGNOSIS — E1149 Type 2 diabetes mellitus with other diabetic neurological complication: Secondary | ICD-10-CM

## 2017-07-22 NOTE — Progress Notes (Signed)
Subjective: Roberto Allen presents the office today for follow-up evaluation of a chronic ulcerations right second toe.  He states he has not noticed any drainage or pus and denies any increase in swelling or any redness to his feet.  Overall he is doing well.  His wife continues to change the bandage daily.  He is wearing a regular shoe.  He denies any systemic complaints including fevers, chills, nausea, vomiting.  Denies any calf pain, chest pain, shortness of breath.  No other concerns today.  Objective: AAO x3, NAD DP/PT pulses palpable bilaterally, CRT less than 3 seconds Hyperkeratotic tissue to the distal aspect of the right second toe.  Upon debridement there is a central granular wound measuring approximately 0.2 x 0.1 cm.  Appears to be about the same.  There is no significant erythema there is no increase in warmth.  There is no drainage or pus.  There is no fluctuation or crepitation or any malodor.  On the right foot the metatarsal one is hyperkeratotic lesion with evidence of dried blood.  Upon debridement there is no underlying ulceration, drainage but this area is pre-ulcerative.  No signs of infection. No other open lesions or pre-ulcerative lesions. There is no pain with calf compression, swelling, warmth, erythema.  Assessment: Chronic ulceration right second toe  Plan: -All treatment options discussed with the patient including all alternatives, risks, complications -The wound was sharply debrided to the any complications to healthy, granular tissue.  There is no probing to bone.  Continue offloading at all times as well as daily dressing changes.   -I dispensed a toe separator with a limp to help take pressure off of the area. -Ordered blood work including ESR, CRP, CBC, basic metabolic panel.  Unfortunately not get x-rays.  We will do this next appointment. -Monitor for any clinical signs or symptoms of infection and directed to call the office immediately should any occur or go to  the ER. -RTC 2 weeks  [name] DPM

## 2017-07-23 LAB — BASIC METABOLIC PANEL
BUN/Creatinine Ratio: 14 (calc) (ref 6–22)
BUN: 27 mg/dL — ABNORMAL HIGH (ref 7–25)
CALCIUM: 9.7 mg/dL (ref 8.6–10.3)
CO2: 30 mmol/L (ref 20–32)
Chloride: 105 mmol/L (ref 98–110)
Creat: 1.93 mg/dL — ABNORMAL HIGH (ref 0.70–1.33)
GLUCOSE: 160 mg/dL — AB (ref 65–99)
POTASSIUM: 4.2 mmol/L (ref 3.5–5.3)
SODIUM: 141 mmol/L (ref 135–146)

## 2017-07-23 LAB — CBC WITH DIFFERENTIAL/PLATELET
BASOS ABS: 47 {cells}/uL (ref 0–200)
Basophils Relative: 0.5 %
EOS PCT: 1.8 %
Eosinophils Absolute: 167 cells/uL (ref 15–500)
HEMATOCRIT: 35.8 % — AB (ref 38.5–50.0)
Hemoglobin: 12.1 g/dL — ABNORMAL LOW (ref 13.2–17.1)
LYMPHS ABS: 1748 {cells}/uL (ref 850–3900)
MCH: 28.3 pg (ref 27.0–33.0)
MCHC: 33.8 g/dL (ref 32.0–36.0)
MCV: 83.8 fL (ref 80.0–100.0)
MPV: 8.8 fL (ref 7.5–12.5)
Monocytes Relative: 6.3 %
Neutro Abs: 6752 cells/uL (ref 1500–7800)
Neutrophils Relative %: 72.6 %
Platelets: 404 10*3/uL — ABNORMAL HIGH (ref 140–400)
RBC: 4.27 10*6/uL (ref 4.20–5.80)
RDW: 13.6 % (ref 11.0–15.0)
TOTAL LYMPHOCYTE: 18.8 %
WBC mixed population: 586 cells/uL (ref 200–950)
WBC: 9.3 10*3/uL (ref 3.8–10.8)

## 2017-07-23 LAB — SEDIMENTATION RATE: Sed Rate: 17 mm/h (ref 0–20)

## 2017-07-23 LAB — C-REACTIVE PROTEIN: CRP: 4.6 mg/L (ref <8.0)

## 2017-07-28 ENCOUNTER — Telehealth: Payer: Self-pay | Admitting: *Deleted

## 2017-07-28 NOTE — Telephone Encounter (Signed)
I informed pt of Dr. Wagoner's review of results. 

## 2017-07-28 NOTE — Telephone Encounter (Signed)
-----   Message from [name], DPM sent at 07/27/2017  3:02 PM EDT ----- Val- please let him know that his infection labs look good and his kidneys are stable. Follow-up as schedule or sooner if he has any issues.

## 2017-08-05 ENCOUNTER — Ambulatory Visit (INDEPENDENT_AMBULATORY_CARE_PROVIDER_SITE_OTHER): Payer: BLUE CROSS/BLUE SHIELD

## 2017-08-05 ENCOUNTER — Ambulatory Visit (INDEPENDENT_AMBULATORY_CARE_PROVIDER_SITE_OTHER): Payer: BLUE CROSS/BLUE SHIELD | Admitting: Podiatry

## 2017-08-05 ENCOUNTER — Encounter: Payer: Self-pay | Admitting: Podiatry

## 2017-08-05 DIAGNOSIS — L84 Corns and callosities: Secondary | ICD-10-CM

## 2017-08-05 DIAGNOSIS — L97511 Non-pressure chronic ulcer of other part of right foot limited to breakdown of skin: Secondary | ICD-10-CM | POA: Diagnosis not present

## 2017-08-05 DIAGNOSIS — E1149 Type 2 diabetes mellitus with other diabetic neurological complication: Secondary | ICD-10-CM | POA: Diagnosis not present

## 2017-08-05 NOTE — Progress Notes (Signed)
Subjective: Roberto Allen presents the office today for follow-up evaluation of a chronic ulcerations right second toe.  He states he is doing well he denies any redness or drainage or any swelling to the area.  His wife is continues to change the bandages daily.  He has no new concerns today.  No recent injury. He denies any systemic complaints including fevers, chills, nausea, vomiting.  Denies any calf pain, chest pain, shortness of breath.  No other concerns today.  Objective: AAO x3, NAD DP/PT pulses palpable bilaterally, CRT less than 3 seconds Hyperkeratotic tissue to the distal aspect of the right second toe. Upon debridement there is a superficial granular wound present but this appears to be more of an abrasion type lesion. There is no probing, undermining or tunneling.  There is no edema, erythema, drainage or pus or any other clinical signs of infection.  There is a pre-ulcerative callus present on the right foot submetatarsal 1.  Again there is a very small superficial area of skin breakdown but there is no erythema, edema, drainage and cellulitis.  There is no malodor. No other open lesions or pre-ulcerative lesions. There is no pain with calf compression, swelling, warmth, erythema.  Assessment: Chronic ulceration right second toe with small superficial ulcer on the right submet 1. No signs of infection  Plan: -All treatment options discussed with the patient including all alternatives, risks, complications -X-rays were obtained reviewed.  There is no evidence of acute fracture and there is no definitive evidence of acute osteomyelitis there is no soft tissue emphysema present. -I reviewed the blood work with him. -I sharply debrided the right second toe as well as submetatarsal 1 with any complications with bleeding.  Continue with antibiotic ointment dressing changes daily also at all times.  Overall the second toe appears to be doing better.  No signs of infection will hold off any  antibiotics at this point he has been off antibiotics for couple weeks now. -Monitor for any clinical signs or symptoms of infection and directed to call the office immediately should any occur or go to the ER.  Return in about 3 weeks (around 08/26/2017).  [name] DPM

## 2017-08-20 ENCOUNTER — Ambulatory Visit (HOSPITAL_COMMUNITY)
Admission: EM | Admit: 2017-08-20 | Discharge: 2017-08-20 | Disposition: A | Discharge status: Home / Self Care | Payer: BLUE CROSS/BLUE SHIELD | Attending: Family Medicine | Admitting: Family Medicine

## 2017-08-20 ENCOUNTER — Encounter (HOSPITAL_COMMUNITY): Payer: Self-pay

## 2017-08-20 DIAGNOSIS — F518 Other sleep disorders not due to a substance or known physiological condition: Secondary | ICD-10-CM | POA: Diagnosis not present

## 2017-08-20 DIAGNOSIS — F5089 Other specified eating disorder: Secondary | ICD-10-CM | POA: Diagnosis not present

## 2017-08-20 DIAGNOSIS — Z634 Disappearance and death of family member: Secondary | ICD-10-CM

## 2017-08-20 DIAGNOSIS — F4329 Adjustment disorder with other symptoms: Secondary | ICD-10-CM | POA: Diagnosis not present

## 2017-08-20 MED ORDER — TRAZODONE HCL 50 MG PO TABS: 25.0000 mg | ORAL_TABLET | Freq: Every evening | ORAL | 1 refills | Status: DC | PRN

## 2017-08-20 NOTE — Discharge Instructions (Signed)
This is a very normal part of the process you are going through.  Sleep Hygiene Tips: Do not watch TV or look at screens within 1 hour of going to bed. If you do, make sure there is a blue light filter (nighttime mode) involved. Try to go to bed around the same time every night. Wake up at the same time within 1 hour of regular time. Ex: If you wake up at 7 AM for work, do not sleep past 8 AM on days that you don't work. Do not drink alcohol before bedtime. Do not consume caffeine-containing beverages after noon or within 9 hours of intended bedtime. Get regular exercise/physical activity in your life, but not within 2 hours of planned bedtime. Do not take naps.  Do not eat within 2 hours of planned bedtime. Melatonin, 3-5 mg 30-60 minutes before planned bedtime may be helpful.  The bed should be for sleep or sex only. If after 20-30 minutes you are unable to fall asleep, get up and do something relaxing. Do this until you feel ready to go to sleep again.   Coping skills Choose 5 that work for you: Take a deep breath Count to 20 Read a book Do a puzzle Meditate Bake Sing Knit Garden Pray Go outside Call a friend Listen to music Take a walk Color Send a note Take a bath Watch a movie Be alone in a quiet place Pet an animal Visit a friend Journal Exercise Stretch

## 2017-08-20 NOTE — ED Triage Notes (Signed)
Pt presents with complaints of feeling depressed and not being able to sleep at night. His wife suddenly passed away on 05-26-2022.  Reports having a good support system at home. Concerned about anxiety for the upcoming funeral.  Denies any SI or HI.

## 2017-08-20 NOTE — ED Provider Notes (Signed)
CC: Depression  Subjective Roberto Allen is an 56 y.o. male who presents with bereavement after unexpected death of his wife. She passed away 4 days ago after sustaining a heart attack 6 days ago. Since that time, he is feeling depressed, little interest in doing things, anxious, fatigued and having difficulty sleeping. He did not eat for 2 days after this happened. He has family coming down and a strong set of friends through his church. Workplace is very understanding and accommodating.    Past Medical History:  Diagnosis Date  . CAD (coronary artery disease)    a. LHC 4/13: mD1 25%, oD2 25%, pOM1 25%, pRCA 25%, dRCA 40%, EF 30%  . Chronic systolic heart failure (HCC)    a. EF 30-35% in 04/2011, improved to 55-60% in 01/2012.  . CKD (chronic kidney disease), stage II   . COPD (chronic obstructive pulmonary disease) (HCC)   . Diabetes mellitus   . GERD (gastroesophageal reflux disease)   . History of pneumonia   . Hx of cardiovascular stress test    a. Nuclear (12/2012):  No ischemia, EF 59%  . Hyperkalemia    a. 12/2012 - spironolactone, KCl supp discontinued.  . Hyperlipidemia   . Hypertension   . Hypertriglyceridemia    a. 12/2012 - started on fenofibrate.  . Knee pain   . NICM (nonischemic cardiomyopathy) (HCC)    a. Echo 3/13: mild LVH, EF 30-35%, grade 3 diast dysfxn, mod LAE;  RHC 4/13:  RA 8, RV 58/10, PA 56/18, mean 35, PCWP mean 17, LV 154/27, CO 4.3, CI 2.0. b. EF Improved >>> Echo (01/2012):  Mild LVH, EF 55-60%, no RWMA, Gr 1 DD    Medications Current Outpatient Medications on File Prior to Encounter  Medication Sig Dispense Refill  . ACCU-CHEK FASTCLIX LANCETS MISC USE TO MONITOR BLOOD GLUCOSE 3 TIME(S) DAILY  5  . amLODipine (NORVASC) 5 MG tablet Take 5 mg by mouth daily.  3  . aspirin 81 MG tablet Take 81 mg by mouth daily.    . BD PEN NEEDLE NANO U/F 32G X 4 MM MISC 1 EACH BY DOES NOT APPLY ROUTE 2 (TWO) TIMES DAILY.  3  . Blood Glucose Monitoring Suppl  (ACCU-CHEK GUIDE) w/Device KIT 3 TIMES A DAY. USE AS DIRECTED BY PHYSICIAN TO MONITOR BLOOD SUGARS    . doxycycline (VIBRA-TABS) 100 MG tablet Take 1 tablet (100 mg total) by mouth 2 (two) times daily. 28 tablet 0  . fenofibrate (TRICOR) 48 MG tablet Take 1 tablet (48 mg total) by mouth daily. 30 tablet 6  . furosemide (LASIX) 40 MG tablet Take 1 tablet (40 mg total) by mouth daily. 30 tablet 11  . glucose blood (ONETOUCH VERIO) test strip 4-5 TIMES PER DAY    . hydrALAZINE (APRESOLINE) 25 MG tablet TAKE 1 TABLET BY MOUTH 3 TIMES A DAY    . Insulin Degludec 200 UNIT/ML SOPN Inject into the skin.    . isosorbide mononitrate (IMDUR) 120 MG 24 hr tablet TAKE 1 TABLET (120 MG TOTAL) BY MOUTH DAILY. 30 tablet 8  . LANTUS 100 UNIT/ML injection     . liraglutide 18 MG/3ML SOPN INJECT 0.3 MLS (1.8 MG TOTAL) INTO THE SKIN DAILY.    . lisinopril (PRINIVIL,ZESTRIL) 40 MG tablet Take by mouth.    . metoprolol succinate (TOPROL-XL) 100 MG 24 hr tablet Take by mouth.    . omeprazole (PRILOSEC) 20 MG capsule     . OZEMPIC 1 MG/DOSE SOPN     .   pravastatin (PRAVACHOL) 20 MG tablet Take by mouth.    . predniSONE (DELTASONE) 10 MG tablet Take with meals-5 tabs daily for 2 days,4 tabs daily for 2 days,3 tabs daily for 2 days,2 tabs daily for 2 days,1 tab daily for 2 days &STOP    . silver sulfADIAZINE (SILVADENE) 1 % cream Apply 1 application topically daily. 50 g 0  . traMADol (ULTRAM) 50 MG tablet Take 1 tablet (50 mg total) every 12 (twelve) hours as needed by mouth. 10 tablet 0  . [DISCONTINUED] potassium chloride (KLOR-CON) 20 MEQ packet Take 20 mEq by mouth 2 (two) times daily.      Allergies Allergies  Allergen Reactions  . Metformin And Related Other (See Comments)    Due to CHF and CRI    Family History Family History  Problem Relation Age of Onset  . Diabetes Sister   . Breast cancer Mother   . Breast cancer Maternal Grandmother      Review Of Systems Cardiovascular:  no chest pain, no  palpitations Psychiatric: as noted in HPI  Exam BP 124/60 (BP Location: Right Arm)   Pulse 83   Temp 98.4 F (36.9 C) (Oral)   Resp 16   SpO2 97%  General:  well developed, well nourished, in no apparent distress Neck: neck supple without adenopathy, thyromegaly, or masses Lungs:  clear to auscultation, breath sounds equal bilaterally, normal respiratory effort without accessory muscle use Cardio:  regular rate and rhythm without murmurs Abdomen:  abdomen soft, nontender; bowel sounds normal; no masses or organomegaly Neuro:  deep tendon reflexes normal and symmetric and no cerebellar signs or ataxia noted Psych: well oriented with normal range of affect and age-appropriate judgement/insight  Assessment and Plan  Bereavement  Trazodone 25-50 mg qhs prn sleep. Sleep hygiene and anxiety coping provided in avs. Recommended he cont counseling. Assured that this is normal process with bereavement. F/u w reg PCP as soon as able. Patient voiced understanding and agreement to the plan.    Wendling, Nicholas Paul, DO 08/20/17 1615  

## 2017-08-26 ENCOUNTER — Ambulatory Visit (INDEPENDENT_AMBULATORY_CARE_PROVIDER_SITE_OTHER): Payer: BLUE CROSS/BLUE SHIELD | Admitting: Podiatry

## 2017-08-26 ENCOUNTER — Encounter: Payer: Self-pay | Admitting: Podiatry

## 2017-08-26 DIAGNOSIS — E1149 Type 2 diabetes mellitus with other diabetic neurological complication: Secondary | ICD-10-CM

## 2017-08-26 DIAGNOSIS — L97511 Non-pressure chronic ulcer of other part of right foot limited to breakdown of skin: Secondary | ICD-10-CM | POA: Diagnosis not present

## 2017-08-26 NOTE — Progress Notes (Signed)
Subjective: Roberto Allen presents the office today for follow-up evaluation of a chronic ulcerations right second toe.  He states the wounds are doing well but he has normal pain attention to the wound over the last week and his wife recently passed away suddenly.  However he has not noticed any drainage or pus coming from the toe with the foot denies any increasing swelling redness of the foot.  He has no new concerns. He denies any systemic complaints including fevers, chills, nausea, vomiting.  Denies any calf pain, chest pain, shortness of breath.  No other concerns today.  Objective: AAO x3, NAD DP/PT pulses palpable bilaterally, CRT less than 3 seconds Hyperkeratotic tissue to the distal aspect of the right second toe.  Plantar pain appears that the wound has healed however the area there is a pre-ulcerative callus.  Occipital sub-callus right foot sub-metatarsals 1 there is evidence of dried blood however upon debridement there is no underlying ulceration identified at this time.  Also there is a new what appears to be a little blister in the sulcus of the plantar aspect of the first toe.  There is no signs of infection to these areas.  No other lesions or pre-ulcerative lesions. There is no pain with calf compression, swelling, warmth, erythema.  Assessment: Chronic pre-ulcerative calluses right foot  Plan: -All treatment options discussed with the patient including all alternatives, risks, complications -Patient appeared to be depressive calluses x3 without any complications or bleeding.  Upon debridement of the old blister in the sulcus of the first toe of the superficial wound type lesion and Silvadene was applied.  Continue offloading at all times and continue with daily dressing changes.  He thinks that the new area on the sulcus of the first toe started when he changed shoes and I recommended not to wear that shoe. -Monitor for any clinical signs or symptoms of infection and directed to call  the office immediately should any occur or go to the ER.  Return in about 2 weeks (around 09/09/2017).  [name] DPM

## 2017-08-30 DIAGNOSIS — N184 Chronic kidney disease, stage 4 (severe): Secondary | ICD-10-CM | POA: Insufficient documentation

## 2017-08-30 DIAGNOSIS — I129 Hypertensive chronic kidney disease with stage 1 through stage 4 chronic kidney disease, or unspecified chronic kidney disease: Secondary | ICD-10-CM | POA: Insufficient documentation

## 2017-08-30 DIAGNOSIS — E1122 Type 2 diabetes mellitus with diabetic chronic kidney disease: Secondary | ICD-10-CM | POA: Insufficient documentation

## 2017-09-11 ENCOUNTER — Other Ambulatory Visit: Payer: Self-pay | Admitting: Family Medicine

## 2017-09-16 ENCOUNTER — Ambulatory Visit (INDEPENDENT_AMBULATORY_CARE_PROVIDER_SITE_OTHER): Payer: BLUE CROSS/BLUE SHIELD | Admitting: Podiatry

## 2017-09-16 ENCOUNTER — Encounter: Payer: Self-pay | Admitting: Podiatry

## 2017-09-16 DIAGNOSIS — L97511 Non-pressure chronic ulcer of other part of right foot limited to breakdown of skin: Secondary | ICD-10-CM

## 2017-09-16 DIAGNOSIS — E1149 Type 2 diabetes mellitus with other diabetic neurological complication: Secondary | ICD-10-CM | POA: Diagnosis not present

## 2017-09-16 DIAGNOSIS — L84 Corns and callosities: Secondary | ICD-10-CM | POA: Diagnosis not present

## 2017-09-21 NOTE — Progress Notes (Signed)
Subjective: Roberto Allen presents the office today for follow-up evaluation of a chronic ulcerations right second toe.  He states the second toe is doing well.  He has noticed some blood under the callus in the right foot, pointing to submetatarsal 1 but denies any drainage or pus and denies any increase in swelling or redness.  Denies any red streaks.  He has no other concerns today. He denies any systemic complaints including fevers, chills, nausea, vomiting.  Denies any calf pain, chest pain, shortness of breath.  No other concerns today.  Objective: AAO x3, NAD DP/PT pulses palpable bilaterally, CRT less than 3 seconds Hyperkeratotic tissue to the distal aspect of the right second toe.  Upon debridement the wound appears to be healed there is a recurrent hyperkeratotic lesion.  On the right foot submetatarsal one is a thick hyperkeratotic lesion with evidence of dried blood.  Upon debridement there is an area of superficial skin breakdown there is macerated tissue.  There is no probing, undermining or tunneling.  There is no surrounding erythema, ascending cellulitis.  There is no fluctuation or crepitation or any malodor.  No other open lesions. There is no pain with calf compression, swelling, warmth, erythema.  Assessment: Chronic pre-ulcerative calluses right foot  Plan: -All treatment options discussed with the patient including all alternatives, risks, complications -Hyperkeratotic lesion of the right second toe sharply debrided without any complications or bleeding.  I also sharply debrided the left submetatarsal 1 hyperkeratotic lesion to reveal superficial area of skin breakdown macerated tissue.  Recommended small amount of Betadine to the area daily.  Continue offloading at all times.  There is no signs of infection today so we will hold off on antibiotic but continue to monitor for any signs or symptoms of infection. -Monitor for any clinical signs or symptoms of infection and directed to  call the office immediately should any occur or go to the ER.  Return in about 2 weeks (around 09/30/2017).  [name] DPM

## 2017-09-30 ENCOUNTER — Ambulatory Visit (INDEPENDENT_AMBULATORY_CARE_PROVIDER_SITE_OTHER): Payer: BLUE CROSS/BLUE SHIELD | Admitting: Podiatry

## 2017-09-30 ENCOUNTER — Encounter: Payer: Self-pay | Admitting: Podiatry

## 2017-09-30 DIAGNOSIS — E1149 Type 2 diabetes mellitus with other diabetic neurological complication: Secondary | ICD-10-CM

## 2017-09-30 DIAGNOSIS — L97511 Non-pressure chronic ulcer of other part of right foot limited to breakdown of skin: Secondary | ICD-10-CM | POA: Diagnosis not present

## 2017-09-30 NOTE — Progress Notes (Signed)
Subjective: Roberto Allen presents the office today for follow-up evaluation of a chronic ulcerations right second toe.  He states he is doing well he has not noticed any increase in swelling or drainage or pus.  He has not been putting a bandage on the area.  He has no other concerns today.  Overall he feels well he denies any systemic complaints including fevers, chills, nausea, vomiting.  No calf pain chest pain shortness of breath.  No other concerns.  Objective: AAO x3, NAD DP/PT pulses palpable bilaterally, CRT less than 3 seconds Hyperkeratotic tissue to the distal aspect of the right second toe.  Hyperkeratotic lesion to the distal portion of the second toe.  Upon debridement the superficial wound without any probing, undermining or tunneling with some hyper granulation tissue present.  There is macerated periwound.  There is no probing to bone, undermining or tunneling there is no drainage or pus there is no fluctuation or crepitation or any malodor. On the right foot the metatarsal is a hyperkeratotic lesion.  There is no underlying ulceration drainage or any signs of infection noted today. No other open lesions or pre-ulcerations. There is no pain with calf compression, swelling, warmth, erythema.  Assessment: Wound right foot  Plan: -All treatment options discussed with the patient including all alternatives, risks, complications -Today I sharply debrided the wound on the right second toe without any complications utilizing #250 blade scalpel to healthy, granular tissue.  Silver nitrate was applied followed by interbody ointment and a bandage.  I dispensed a further offloading pad.  Continue offloading at all times.  We are awaiting diabetic shoes.  Debrided the hyperkeratotic lesion right foot metatarsal bone without any complications or bleeding.  Continue to monitor for any breakdown.  RTC 2 weeks or sooner if needed  [name] DPM

## 2017-10-03 ENCOUNTER — Ambulatory Visit (HOSPITAL_COMMUNITY)
Admission: EM | Admit: 2017-10-03 | Discharge: 2017-10-03 | Disposition: A | Discharge status: Home / Self Care | Payer: BLUE CROSS/BLUE SHIELD | Source: Home / Self Care

## 2017-10-03 ENCOUNTER — Encounter (HOSPITAL_COMMUNITY): Payer: Self-pay | Admitting: Emergency Medicine

## 2017-10-03 ENCOUNTER — Emergency Department (HOSPITAL_COMMUNITY): Payer: BLUE CROSS/BLUE SHIELD

## 2017-10-03 ENCOUNTER — Other Ambulatory Visit: Payer: Self-pay

## 2017-10-03 ENCOUNTER — Emergency Department (HOSPITAL_COMMUNITY)
Admission: EM | Admit: 2017-10-03 | Discharge: 2017-10-03 | Disposition: A | Discharge status: Home / Self Care | Payer: BLUE CROSS/BLUE SHIELD | Attending: Emergency Medicine | Admitting: Emergency Medicine

## 2017-10-03 DIAGNOSIS — I130 Hypertensive heart and chronic kidney disease with heart failure and stage 1 through stage 4 chronic kidney disease, or unspecified chronic kidney disease: Secondary | ICD-10-CM | POA: Diagnosis not present

## 2017-10-03 DIAGNOSIS — R55 Syncope and collapse: Secondary | ICD-10-CM

## 2017-10-03 DIAGNOSIS — Z79899 Other long term (current) drug therapy: Secondary | ICD-10-CM | POA: Insufficient documentation

## 2017-10-03 DIAGNOSIS — Z794 Long term (current) use of insulin: Secondary | ICD-10-CM | POA: Diagnosis not present

## 2017-10-03 DIAGNOSIS — N183 Chronic kidney disease, stage 3 (moderate): Secondary | ICD-10-CM | POA: Diagnosis not present

## 2017-10-03 DIAGNOSIS — I5022 Chronic systolic (congestive) heart failure: Secondary | ICD-10-CM | POA: Diagnosis not present

## 2017-10-03 DIAGNOSIS — I2510 Atherosclerotic heart disease of native coronary artery without angina pectoris: Secondary | ICD-10-CM | POA: Diagnosis not present

## 2017-10-03 DIAGNOSIS — E1122 Type 2 diabetes mellitus with diabetic chronic kidney disease: Secondary | ICD-10-CM | POA: Diagnosis not present

## 2017-10-03 LAB — BASIC METABOLIC PANEL
Anion gap: 10 (ref 5–15)
BUN: 16 mg/dL (ref 6–20)
CHLORIDE: 106 mmol/L (ref 98–111)
CO2: 25 mmol/L (ref 22–32)
Calcium: 9.2 mg/dL (ref 8.9–10.3)
Creatinine, Ser: 1.76 mg/dL — ABNORMAL HIGH (ref 0.61–1.24)
GFR calc Af Amer: 47 mL/min — ABNORMAL LOW (ref >60)
GFR calc non Af Amer: 41 mL/min — ABNORMAL LOW (ref >60)
GLUCOSE: 125 mg/dL — AB (ref 70–99)
POTASSIUM: 3.6 mmol/L (ref 3.5–5.1)
Sodium: 141 mmol/L (ref 135–145)

## 2017-10-03 LAB — CBC
HEMATOCRIT: 34.3 % — AB (ref 39.0–52.0)
Hemoglobin: 11.9 g/dL — ABNORMAL LOW (ref 13.0–17.0)
MCH: 32.5 pg (ref 26.0–34.0)
MCHC: 34.7 g/dL (ref 30.0–36.0)
MCV: 93.7 fL (ref 78.0–100.0)
PLATELETS: 335 10*3/uL (ref 150–400)
RBC: 3.66 MIL/uL — ABNORMAL LOW (ref 4.22–5.81)
RDW: 13.9 % (ref 11.5–15.5)
WBC: 17 10*3/uL — AB (ref 4.0–10.5)

## 2017-10-03 LAB — I-STAT TROPONIN, ED: Troponin i, poc: 0.02 ng/mL (ref 0.00–0.08)

## 2017-10-03 LAB — BRAIN NATRIURETIC PEPTIDE: B NATRIURETIC PEPTIDE 5: 120.7 pg/mL — AB (ref 0.0–100.0)

## 2017-10-03 NOTE — Discharge Instructions (Addendum)
Continue to follow-up with cardiology as scheduled.

## 2017-10-03 NOTE — ED Notes (Signed)
Roberto [phone] is contact from work. Wife is deceased.

## 2017-10-03 NOTE — ED Triage Notes (Signed)
Pt to ER for evaluation of acute onset light headedness, sweating, and shortness of breath 1 hour PTA. States wife recently passed in July and is under a great amount of stress. Reports compliance with HTN meds, BP 151/131. Pt in NAD at this time. Denies pain.

## 2017-10-03 NOTE — ED Provider Notes (Signed)
[facility] EMERGENCY DEPARTMENT Provider Note   CSN: [id number] Arrival date & time: 10/03/17  0944     History   Chief Complaint Chief Complaint  Patient presents with  . Near Syncope    HPI Roberto Allen is a 57 y.o. male.  The history is provided by the patient.  Near Syncope  This is a new problem. The current episode started 3 to 5 hours ago. The problem has been resolved. Pertinent negatives include no chest pain, no abdominal pain, no headaches and no shortness of breath. The symptoms are aggravated by stress. Nothing relieves the symptoms. He has tried rest for the symptoms. The treatment provided mild relief.    Past Medical History:  Diagnosis Date  . CAD (coronary artery disease)    a. LHC 4/13: mD1 25%, oD2 25%, pOM1 25%, pRCA 25%, dRCA 40%, EF 30%  . Chronic systolic heart failure (HCC)    a. EF 30-35% in 04/2011, improved to 55-60% in 01/2012.  Marland Kitchen CKD (chronic kidney disease), stage II   . COPD (chronic obstructive pulmonary disease) ([place])   . Diabetes mellitus   . GERD (gastroesophageal reflux disease)   . History of pneumonia   . Hx of cardiovascular stress test    a. Nuclear (12/2012):  No ischemia, EF 59%  . Hyperkalemia    a. 12/2012 - spironolactone, KCl supp discontinued.  . Hyperlipidemia   . Hypertension   . Hypertriglyceridemia    a. 12/2012 - started on fenofibrate.  . Knee pain   . NICM (nonischemic cardiomyopathy) ([place])    a. Echo 3/13: mild LVH, EF 30-35%, grade 3 diast dysfxn, mod LAE;  RHC 4/13:  RA 8, RV 58/10, PA 56/18, mean 35, PCWP mean 17, LV 154/27, CO 4.3, CI 2.0. b. EF Improved >>> Echo (01/2012):  Mild LVH, EF 55-60%, no RWMA, Gr 1 DD    Patient Active Problem List   Diagnosis Date Noted  . GERD (gastroesophageal reflux disease) 07/23/2016  . Degenerative disc disease at L5-S1 level 04/09/2016  . Primary osteoarthritis of right hip 04/09/2016  . Obesity, Class I, BMI 30-34.9 02/06/2016  . Ulcer of left foot  ([place]) 01/06/2015  . Microalbuminuria due to type 2 diabetes mellitus ([place]) 10/03/2014  . 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 diabetes mellitus ([place]) 06/26/2014  . NICM (nonischemic cardiomyopathy) ([place]) 09/06/2013  . ED (erectile dysfunction) 03/27/2013  . Acute on chronic renal insufficiency 12/17/2012  . Hypertriglyceridemia 12/17/2012  . Disorder of kidney and ureter 12/17/2012  . Precordial chest pain 12/16/2012  . Hyperkalemia 12/16/2012  . Diabetes mellitus ([place]) 12/16/2012  . Coronary Artery Disease (non-obstructive by cath 05/2011) 06/14/2011  . Hyperlipidemia 06/14/2011  . OSA (obstructive sleep apnea) 05/25/2011  . Chronic systolic heart failure ([place]) 05/11/2011  . Heart failure ([place]) 04/27/2011  . Hypertension   . SOB (shortness of breath)     Past Surgical History:  Procedure Laterality Date  . None          Home Medications    Prior to Admission medications   Medication Sig Start Date End Date Taking? Authorizing Provider  aspirin 81 MG tablet Take 81 mg by mouth daily.   Yes [provider]  fenofibrate (TRICOR) 48 MG tablet Take 1 tablet (48 mg total) by mouth daily. 12/17/12  Yes Dunn, Dayna N, PA-C  furosemide (LASIX) 40 MG tablet Take 1 tablet (40 mg total) by mouth daily. 09/06/13  Yes Weaver, Scott T, PA-C  hydrALAZINE (APRESOLINE)  25 MG tablet Take 25 mg by mouth 3 (three) times daily.  06/22/16  Yes [provider]  isosorbide mononitrate (IMDUR) 120 MG 24 hr tablet TAKE 1 TABLET (120 MG TOTAL) BY MOUTH DAILY. Patient taking differently: Take 120 mg by mouth daily.  02/21/13  Yes [name], MD  LANTUS 100 UNIT/ML injection Inject 40 Units into the skin at bedtime.  06/22/13  Yes [provider]  lisinopril (PRINIVIL,ZESTRIL) 40 MG tablet Take 40 mg by mouth daily.  06/22/16  Yes [provider]  metoprolol succinate (TOPROL-XL) 100 MG 24 hr tablet Take 100 mg by mouth daily.  06/22/16  Yes [provider]  omeprazole  (PRILOSEC) 20 MG capsule Take 20 mg by mouth daily.  05/09/16  Yes [provider]  OZEMPIC 1 MG/DOSE SOPN Inject 1 mg into the skin every Saturday.  09/25/16  Yes [provider]  pravastatin (PRAVACHOL) 20 MG tablet Take 20 mg by mouth at bedtime.  06/22/16  Yes [provider]  ACCU-CHEK FASTCLIX LANCETS MISC USE TO MONITOR BLOOD GLUCOSE 3 TIME(S) DAILY 05/05/17   [provider]  BD PEN NEEDLE NANO U/F 32G X 4 MM MISC 1 EACH BY DOES NOT APPLY ROUTE 2 (TWO) TIMES DAILY. 05/18/16   [provider]  Blood Glucose Monitoring Suppl (ACCU-CHEK GUIDE) w/Device KIT 3 TIMES A DAY. USE AS DIRECTED BY PHYSICIAN TO MONITOR BLOOD SUGARS 04/05/17   [provider]  glucose blood (ONETOUCH VERIO) test strip 4-5 TIMES PER DAY 05/17/16   [provider]  silver sulfADIAZINE (SILVADENE) 1 % cream Apply 1 application topically daily. Patient not taking: Reported on 10/03/2017 07/16/16   [name], DPM  traZODone (DESYREL) 50 MG tablet Take 0.5-1 tablets (25-50 mg total) by mouth at bedtime as needed for sleep. Patient not taking: Reported on 10/03/2017 08/20/17   Shelda Pal, DO  potassium chloride (KLOR-CON) 20 MEQ packet Take 20 mEq by mouth 2 (two) times daily.  04/23/11  [provider]    Family History Family History  Problem Relation Age of Onset  . Diabetes Sister   . Breast cancer Mother   . Breast cancer Maternal Grandmother     Social History Social History   Tobacco Use  . Smoking status: Former Smoker    Packs/day: 1.00    Years: 20.00    Pack years: 20.00    Types: Cigarettes    Last attempt to quit: 09/15/2006    Years since quitting: 11.0  . Smokeless tobacco: Never Used  Substance Use Topics  . Alcohol use: Yes    Comment: occ  . Drug use: No     Allergies   Metformin and related   Review of Systems Review of Systems  Constitutional: Negative for chills and fever.  HENT: Negative for ear pain  and sore throat.   Eyes: Negative for pain and visual disturbance.  Respiratory: Negative for cough, shortness of breath and stridor.   Cardiovascular: Positive for near-syncope. Negative for chest pain and palpitations.  Gastrointestinal: Negative for abdominal pain and vomiting.  Genitourinary: Negative for dysuria and hematuria.  Musculoskeletal: Negative for arthralgias and back pain.  Skin: Negative for color change and rash.  Neurological: Negative for seizures, syncope and headaches.  Psychiatric/Behavioral: The patient is not nervous/anxious.   All other systems reviewed and are negative.    Physical Exam Updated Vital Signs  ED Triage Vitals [10/03/17 0951]  Enc Vitals Group     BP Marland Kitchen)  151/131     Pulse Rate 82     Resp 18     Temp 98.8 F (37.1 C)     Temp Source Oral     SpO2 95 %     Weight      Height      Head Circumference      Peak Flow      Pain Score 0     Pain Loc      Pain Edu?      Excl. in [place]?     Physical Exam  Constitutional: He appears well-developed and well-nourished.  HENT:  Head: Normocephalic and atraumatic.  Mouth/Throat: Oropharynx is clear and moist. No posterior oropharyngeal edema.  Eyes: Pupils are equal, round, and reactive to light. Conjunctivae are normal.  Neck: Normal range of motion. Neck supple.  Cardiovascular: Normal rate, regular rhythm, normal heart sounds and intact distal pulses.  No murmur heard. Pulmonary/Chest: Effort normal and breath sounds normal. No respiratory distress. He has no decreased breath sounds. He has no wheezes. He has no rhonchi. He has no rales.  Abdominal: Soft. Bowel sounds are normal. There is no tenderness.  Musculoskeletal: Normal range of motion. He exhibits no edema.       Right lower leg: He exhibits no edema.       Left lower leg: He exhibits no edema.  Neurological: He is alert.  Skin: Skin is warm and dry. Capillary refill takes less than 2 seconds.  Psychiatric: He has a normal mood  and affect.  Nursing note and vitals reviewed.    ED Treatments / Results  Labs (all labs ordered are listed, but only abnormal results are displayed) Labs Reviewed  BASIC METABOLIC PANEL - Abnormal; Notable for the following components:      Result Value   Glucose, Bld 125 (*)    Creatinine, Ser 1.76 (*)    GFR calc non Af Amer 41 (*)    GFR calc Af Amer 47 (*)    All other components within normal limits  CBC - Abnormal; Notable for the following components:   WBC 17.0 (*)    RBC 3.66 (*)    Hemoglobin 11.9 (*)    HCT 34.3 (*)    All other components within normal limits  BRAIN NATRIURETIC PEPTIDE - Abnormal; Notable for the following components:   B Natriuretic Peptide 120.7 (*)    All other components within normal limits  I-STAT TROPONIN, ED    EKG EKG Interpretation  Date/Time:  Monday October 03 2017 09:49:52 EDT Ventricular Rate:  83 PR Interval:  208 QRS Duration: 82 QT Interval:  400 QTC Calculation: 470 R Axis:   45 Text Interpretation:  Normal sinus rhythm Cannot rule out Anterior infarct , age undetermined Abnormal ECG No significant change since Confirmed by Lennice Sites [phone]) on 10/03/2017 12:09:39 PM   Radiology Dg Chest 2 View  Result Date: 10/03/2017 CLINICAL DATA:  Shortness of breath. EXAM: CHEST - 2 VIEW COMPARISON:  12/16/2012 FINDINGS: Mild cardiomegaly. Pulmonary vascularity is normal. Lungs are clear. No effusions. No significant bone abnormality. IMPRESSION: New borderline cardiomegaly.  Otherwise, essentially normal exam. Electronically Signed   By: Lorriane Shire M.D.   On: 10/03/2017 10:42    Procedures Procedures (including critical care time)  Medications Ordered in ED Medications - No data to display   Initial Impression / Assessment and Plan / ED Course  I have reviewed the triage vital signs and the nursing notes.  Pertinent labs &  imaging results that were available during my care of the patient were reviewed by me and  considered in my medical decision making (see chart for details).     Roberto Allen is a 57 year old male with history of CAD, hypertension, high cholesterol, diabetes, heart failure who presents to the ED with near syncope.  Patient with normal vitals.  No fever.  Patient presents with some lightheadedness while at work today.  Patient did not pass out.  Patient has been stressed out about the recent death of his wife.  Just went back to work yesterday and continues to feel lots of grief.  Denies any chest pain, shortness of breath at this time.  Symptoms have resolved since arrival to the ED.  States that he feels overworked.  Patient EKG performed that shows no ischemic changes.  No significant change from prior.  Patient has some cardiomegaly on chest x-ray but no signs of pneumonia, pneumothorax, pleural effusions.  BNP is mildly elevated however patient does not appear volume overloaded on exam.  Otherwise no significant anemia, leukocytosis, electrolyte abnormality.  Patient neurologically intact.  Suspect patient likely with some mild dehydration versus stress.  Doubt cardiac or pulmonary etiology of symptoms at this time.  Echocardiogram did not show any valvular disease in the past.  Suspect patient needs some rest and hydration and was discharged from the ED in good condition.  Recommend follow-up with primary care doctor and cardiology and told to return to the ED if symptoms worsen.  Final Clinical Impressions(s) / ED Diagnoses   Final diagnoses:  Near syncope    ED Discharge Orders    None       Lennice Sites, DO 10/03/17 1339

## 2017-10-07 ENCOUNTER — Other Ambulatory Visit: Payer: Self-pay | Admitting: Family Medicine

## 2017-10-13 ENCOUNTER — Other Ambulatory Visit: Payer: Self-pay | Admitting: Family Medicine

## 2017-10-14 ENCOUNTER — Encounter: Payer: Self-pay | Admitting: Podiatry

## 2017-10-14 ENCOUNTER — Ambulatory Visit (INDEPENDENT_AMBULATORY_CARE_PROVIDER_SITE_OTHER): Payer: BLUE CROSS/BLUE SHIELD | Admitting: Podiatry

## 2017-10-14 DIAGNOSIS — M79676 Pain in unspecified toe(s): Secondary | ICD-10-CM

## 2017-10-14 DIAGNOSIS — L84 Corns and callosities: Secondary | ICD-10-CM | POA: Diagnosis not present

## 2017-10-14 DIAGNOSIS — B351 Tinea unguium: Secondary | ICD-10-CM

## 2017-10-14 DIAGNOSIS — E1149 Type 2 diabetes mellitus with other diabetic neurological complication: Secondary | ICD-10-CM

## 2017-10-14 NOTE — Progress Notes (Signed)
Subjective: Keelin presents the office today for follow-up evaluation of a chronic ulcerations right second toe.  He states that they have been doing well.  Denies any drainage or pus coming from the areas.  Nails are also thickened elongated he cannot trim himself.  He has no other concerns today. Overall he feels well he denies any systemic complaints including fevers, chills, nausea, vomiting.  No calf pain chest pain shortness of breath.  No other concerns.  Objective: AAO x3, NAD DP/PT pulses palpable bilaterally, CRT less than 3 seconds Hyperkeratotic tissue to the distal aspect of the right submetatarsal 1 as well as hyperkeratotic lesion to the distal portion of the second toe.  Upon debridement today the areas appear to be doing well there is no underlying ulceration, drainage or any signs of infection noted today.  No significant swelling or redness.  Nails are hypertrophic, dystrophic, brittle, discolored, elongated 10. No surrounding redness or drainage. Tenderness nails 1-5 bilaterally. No open lesions or pre-ulcerative lesions are identified today. There is no pain with calf compression, swelling, warmth, erythema.  Assessment: Pre-ulcerative calluses right foot, symptomatic onychomycosis  Plan: -All treatment options discussed with the patient including all alternatives, risks, complications -There is a pre-ulcerative calluses x2 without any complications or bleeding.  Continue offloading at all times. -Nails debrided x10 without any complications or bleeding.  Return in about 3 weeks (around 11/04/2017).  [name] DPM

## 2017-11-04 ENCOUNTER — Ambulatory Visit: Payer: BLUE CROSS/BLUE SHIELD | Admitting: Podiatry

## 2017-11-11 ENCOUNTER — Ambulatory Visit (INDEPENDENT_AMBULATORY_CARE_PROVIDER_SITE_OTHER): Payer: BLUE CROSS/BLUE SHIELD | Admitting: Podiatry

## 2017-11-11 DIAGNOSIS — L84 Corns and callosities: Secondary | ICD-10-CM | POA: Diagnosis not present

## 2017-11-11 DIAGNOSIS — E1149 Type 2 diabetes mellitus with other diabetic neurological complication: Secondary | ICD-10-CM

## 2017-11-12 ENCOUNTER — Other Ambulatory Visit: Payer: Self-pay | Admitting: Family Medicine

## 2017-11-14 NOTE — Progress Notes (Signed)
Subjective: Trayshawn presents the office today for follow-up evaluation of a chronic ulcerations right second toe as well as submetatarsal 1.  He states that overall he is doing better he denies any drainage or pus or any redness or swelling.  Continues to wear his inserts inside of his shoes to help offload the area but they are getting worn out. Denies any systemic complaints including fevers, chills, nausea, vomiting.  No calf pain chest pain shortness of breath.  No other concerns.  Objective: AAO x3, NAD DP/PT pulses palpable bilaterally, CRT less than 3 seconds Hyperkeratotic tissue to the distal aspect of the right submetatarsal 1 as well as hyperkeratotic lesion to the distal portion of the second toe.  Upon debridement today the areas appear to be doing well there is no underlying ulceration, drainage or any signs of infection noted today.  No significant swelling or redness.  There is no pain with calf compression, swelling, warmth, erythema.  Assessment: Pre-ulcerative calluses right foot, symptomatic onychomycosis  Plan: -All treatment options discussed with the patient including all alternatives, risks, complications -There is a pre-ulcerative calluses x2 without any complications or bleeding.  Continue offloading at all times.  Check insurance coverage for new inserts as it are becoming worn out.  Return in about 3 weeks (around 12/02/2017).  [name] DPM

## 2017-12-09 ENCOUNTER — Ambulatory Visit: Payer: BLUE CROSS/BLUE SHIELD | Admitting: Podiatry

## 2017-12-30 ENCOUNTER — Encounter: Payer: Self-pay | Admitting: Podiatry

## 2017-12-30 ENCOUNTER — Ambulatory Visit (INDEPENDENT_AMBULATORY_CARE_PROVIDER_SITE_OTHER): Payer: BLUE CROSS/BLUE SHIELD | Admitting: Podiatry

## 2017-12-30 VITALS — Temp 97.9°F

## 2017-12-30 DIAGNOSIS — L84 Corns and callosities: Secondary | ICD-10-CM | POA: Diagnosis not present

## 2017-12-30 DIAGNOSIS — E1149 Type 2 diabetes mellitus with other diabetic neurological complication: Secondary | ICD-10-CM

## 2017-12-30 DIAGNOSIS — L97511 Non-pressure chronic ulcer of other part of right foot limited to breakdown of skin: Secondary | ICD-10-CM

## 2018-01-02 NOTE — Progress Notes (Signed)
Subjective: Roberto Allen presents the office today for follow-up evaluation of a chronic ulcerations right second toe as well as submetatarsal 1.  Since I last saw him he got married and his wife have not seen him.  The toes does not very well however he is noticed some thicker callus on the right foot submetatarsal 1.  Denies any drainage or pus or swelling or redness.  He has no other concerns today. Denies any systemic complaints including fevers, chills, nausea, vomiting.  No calf pain chest pain shortness of breath.  No other concerns.  Objective: AAO x3, NAD-presents today with his new wife DP/PT pulses palpable bilaterally, CRT less than 3 seconds Hyperkeratotic tissue to the distal aspect of the right submetatarsal 1 as well as hyperkeratotic lesion to the distal portion of the second toe.  Along the hyperkeratotic lesion submetatarsal 1 there is evidence of old dried hemorrhagic blister around the periphery.  There is no surrounding erythema, ascending sialitis.  There is no fluctuation or crepitation.  After I debrided the hyperkeratotic tissue submetatarsal 1 there is a superficial wound with macerated tissue present but there is no probing, undermining or tunneling.  I sharply debrided the wound to healthy, granular tissue.  There is no underlying ulceration to the toe callus.  There is no pain with calf compression, swelling, warmth, erythema.  Assessment: Pre-ulcerative calluses right second toe with superficial wound submetatarsal 1  Plan: -All treatment options discussed with the patient including all alternatives, risks, complications -There is a pre-ulcerative calluses x1 without any complications or bleeding to the toe. This has done well -I sharply debrided the hyperkeratotic tissue submetatarsal 1 to reveal underlying wound which is sharp debridement of a 312 with scalpel down to healthy, granular tissue.  Continue with a small amount of Betadine to the area daily given the macerated  tissue underneath the callus.  Monitoring signs or symptoms of infection.  Offloading all times.  Return in about 2 weeks (around 01/13/2018). Wound check  [name] DPM

## 2018-01-16 ENCOUNTER — Encounter: Payer: Self-pay | Admitting: Podiatry

## 2018-01-16 ENCOUNTER — Ambulatory Visit (INDEPENDENT_AMBULATORY_CARE_PROVIDER_SITE_OTHER): Payer: BLUE CROSS/BLUE SHIELD | Admitting: Podiatry

## 2018-01-16 DIAGNOSIS — E1149 Type 2 diabetes mellitus with other diabetic neurological complication: Secondary | ICD-10-CM | POA: Diagnosis not present

## 2018-01-16 DIAGNOSIS — L02611 Cutaneous abscess of right foot: Secondary | ICD-10-CM | POA: Diagnosis not present

## 2018-01-16 DIAGNOSIS — L97512 Non-pressure chronic ulcer of other part of right foot with fat layer exposed: Secondary | ICD-10-CM | POA: Diagnosis not present

## 2018-01-16 MED ORDER — DOXYCYCLINE HYCLATE 100 MG PO TABS: 100.0000 mg | ORAL_TABLET | Freq: Two times a day (BID) | ORAL | 0 refills | Status: DC

## 2018-01-16 MED ORDER — SILVER SULFADIAZINE 1 % EX CREA: 1.0000 "application " | TOPICAL_CREAM | Freq: Every day | CUTANEOUS | 0 refills | Status: DC

## 2018-01-16 NOTE — Progress Notes (Signed)
Subjective: Morton presents the office today for follow-up evaluation of a wound on the right foot, submetatarsal 1.  He states that he is doing well.  He denies any drainage or pus coming from the area denies any increase in swelling.  He has no pain.  Continue to wear a regular shoe.  He has not been putting bandages on the wounds. Denies any systemic complaints including fevers, chills, nausea, vomiting.  No calf pain chest pain shortness of breath.  No other concerns.  Objective: AAO x3, NAD-presents today with his new wife DP/PT pulses palpable bilaterally, CRT less than 3 seconds Hyperkeratotic lesions are present to the distal aspect the right second toe as well as right submetatarsal 1.  There is a blister on the lateral aspect of the second toe on the right foot.  Upon debridement of both the lesions there was purulence and superficial abscess present.  Upon debridement there was no probing to bone or exposed bone.  There is underlying granular tissue present.  There is no fluctuation or crepitation after debridement there is no further purulence after I debrided the area and cleaned it.  No other lesions are present.  Hammertoes are present. There is no pain with calf compression, swelling, warmth, erythema.  Assessment: Pre-ulcerative calluses right second toe with superficial wound submetatarsal 1  Plan: -All treatment options discussed with the patient including all alternatives, risks, complications -I sharply debrided both the hyperkeratotic lesions and wounds x2 on the right foot to reveal underlying abscess to both areas.  I excisionally debrided the wound utilizing a 312 blade scalpel as well as a tissue nipper.  I did clean the areas as well debrided the wounds on the healthy, granular tissue.  Betadine ointment was applied followed by dry sterile dressing.  Will use Silvadene dressing changes daily which I prescribed today.  Also took a wound culture today but I went ahead and  started doxycycline.  I do recommend to wear the surgical shoe with offloading. -Monitoring signs or symptoms of worsening infection to the ER should any occur.  Ultimately we discussed surgical intervention once we get the wounds to heal of personal issues of the last year and he cannot take any time off of work.  [name] DPM

## 2018-01-19 LAB — WOUND CULTURE
MICRO NUMBER:: 91439726
SPECIMEN QUALITY: ADEQUATE

## 2018-01-23 ENCOUNTER — Telehealth: Payer: Self-pay | Admitting: *Deleted

## 2018-01-23 ENCOUNTER — Other Ambulatory Visit: Payer: Self-pay | Admitting: Podiatry

## 2018-01-23 MED ORDER — AMPICILLIN 500 MG PO CAPS: 500.0000 mg | ORAL_CAPSULE | Freq: Two times a day (BID) | ORAL | 0 refills | Status: DC

## 2018-01-23 MED ORDER — CLINDAMYCIN HCL 300 MG PO CAPS: 300.0000 mg | ORAL_CAPSULE | Freq: Three times a day (TID) | ORAL | 0 refills | Status: DC

## 2018-01-23 NOTE — Progress Notes (Signed)
Ampicillin presecribed.   Called and spoke to the pharmacy staff and cancelled clindamycin I originally ordered.

## 2018-01-23 NOTE — Telephone Encounter (Signed)
-----   Message from [name], DPM sent at 01/23/2018  8:26 AM EST ----- Lattie Haw- let him know that I sent ampicillin to his pharmacy due to the culture. He can stop the other antibiotic.

## 2018-01-23 NOTE — Telephone Encounter (Signed)
Called and spoke with the patient and stated per Dr Jacqualyn Posey that culture came back and need to stop the other antibiotic and take the ampicillin instead and is at the pharmacy. Lattie Haw

## 2018-01-27 ENCOUNTER — Ambulatory Visit (INDEPENDENT_AMBULATORY_CARE_PROVIDER_SITE_OTHER): Payer: BLUE CROSS/BLUE SHIELD | Admitting: Podiatry

## 2018-01-27 ENCOUNTER — Encounter: Payer: Self-pay | Admitting: Podiatry

## 2018-01-27 DIAGNOSIS — E1149 Type 2 diabetes mellitus with other diabetic neurological complication: Secondary | ICD-10-CM | POA: Diagnosis not present

## 2018-01-27 DIAGNOSIS — L84 Corns and callosities: Secondary | ICD-10-CM

## 2018-01-27 DIAGNOSIS — M79676 Pain in unspecified toe(s): Secondary | ICD-10-CM

## 2018-01-27 DIAGNOSIS — B351 Tinea unguium: Secondary | ICD-10-CM | POA: Diagnosis not present

## 2018-01-31 NOTE — Progress Notes (Signed)
Subjective: Roberto Allen presents the office today for follow-up evaluation of a wound on the right foot, submetatarsal 1 as well as a second toe.  He states that overall he is doing much better.  He feels that the area is healed.  He has not noticed any drainage or pus or any increase in swelling or redness to his feet.  He is requesting diabetic shoes.  His wife is been changing the bandage daily and thinks is been looking well.  He is also requesting his nails be trimmed today.  Denies any systemic complaints including fevers, chills, nausea, vomiting.  No calf pain chest pain shortness of breath.  No other concerns.  Objective: AAO x3, NAD-presents today with his new wife DP/PT pulses palpable bilaterally, CRT less than 3 seconds Hyperkeratotic lesions are present to the distal aspect the right second toe as well as right submetatarsal 1.  Upon debridement there is no underlying ulceration, drainage or any signs of infection the wounds appear to be healed.  Nails are hypertrophic, dystrophic, discolored with yellow to brown discoloration.  Pain nails 1-5 bilaterally.   Hammertoes present There is no pain with calf compression, swelling, warmth, erythema.  Assessment: Pre-ulcerative calluses right second toe with superficial wound submetatarsal 1; symptomatic onychomycosis  Plan: -All treatment options discussed with the patient including all alternatives, risks, complications -I sharply debrided both the hyperkeratotic lesions and wounds x2 on the right foot without any complications or bleeding. Wounds are healed however continue to monitor for any reoccurrence.  Discussed moisturizer to the areas daily.  Offloading. -Nails debrided x10 without any complications or bleeding. -Paperwork for new diabetic shoes was completed today.  [name] DPM

## 2018-02-10 ENCOUNTER — Ambulatory Visit (INDEPENDENT_AMBULATORY_CARE_PROVIDER_SITE_OTHER): Payer: BLUE CROSS/BLUE SHIELD | Admitting: Podiatry

## 2018-02-10 ENCOUNTER — Encounter: Payer: Self-pay | Admitting: Podiatry

## 2018-02-10 DIAGNOSIS — L84 Corns and callosities: Secondary | ICD-10-CM

## 2018-02-10 DIAGNOSIS — L97511 Non-pressure chronic ulcer of other part of right foot limited to breakdown of skin: Secondary | ICD-10-CM

## 2018-02-10 DIAGNOSIS — M2041 Other hammer toe(s) (acquired), right foot: Secondary | ICD-10-CM

## 2018-02-13 NOTE — Progress Notes (Signed)
Subjective: Roberto Allen presents the office today for follow-up evaluation of a wound on the right foot, submetatarsal 1 as well as a second toe.  His wife states that she has been changing the bandage daily and overall the wound is looking better.  Denies any drainage or pus or any redness or swelling.  They have no other concerns today. Denies any systemic complaints including fevers, chills, nausea, vomiting.  No calf pain chest pain shortness of breath.  No other concerns.  Objective: AAO x3, NAD-presents today with his new wife DP/PT pulses palpable bilaterally, CRT less than 3 seconds Hyperkeratotic lesions are present to the distal aspect the right second toe as well as right submetatarsal 1.  Upon debridement submetatarsal 1 the wound is healed.  To the distal aspect of the third toe area is pre-ulcerative as well.  There is no open sores there is no drainage or pus identified.  No significant swelling or erythema.  No fluctuation crepitation any malodor. Left foot is stable with minimal hyperkeratotic tissue.   There is no pain with calf compression, swelling, warmth, erythema.  Assessment: Right foot pre-ulcerative lesions  Plan: -All treatment options discussed with the patient including all alternatives, risks, complications -I sharply debrided both the hyperkeratotic lesions and wounds x2 on the right foot without any complications or bleeding. Discussed moisturizer to the areas daily.  Offloading. -Ultimately discussed with him hammertoe repair of the right second toe.  [name] DPM

## 2018-03-03 ENCOUNTER — Ambulatory Visit (INDEPENDENT_AMBULATORY_CARE_PROVIDER_SITE_OTHER): Payer: BLUE CROSS/BLUE SHIELD | Admitting: Podiatry

## 2018-03-03 DIAGNOSIS — M79676 Pain in unspecified toe(s): Secondary | ICD-10-CM | POA: Diagnosis not present

## 2018-03-03 DIAGNOSIS — E1149 Type 2 diabetes mellitus with other diabetic neurological complication: Secondary | ICD-10-CM

## 2018-03-03 DIAGNOSIS — L84 Corns and callosities: Secondary | ICD-10-CM | POA: Diagnosis not present

## 2018-03-03 DIAGNOSIS — B351 Tinea unguium: Secondary | ICD-10-CM

## 2018-03-04 NOTE — Progress Notes (Signed)
Subjective: [place] presents the office today for follow-up evaluation of a wound on the right foot, submetatarsal 1 as well as a second toe.  He also presents today asking for his nails be trimmed as they are thickened elongated causing irritation inside his shoes.  He states the wounds are doing much better and his wife is been keeping moisturizer on the area daily he denies any drainage or pus or any swelling.  He has no other concerns today. Denies any systemic complaints including fevers, chills, nausea, vomiting.  No calf pain chest pain shortness of breath.   Objective: AAO x3, NAD-presents today with his new wife DP/PT pulses palpable bilaterally, CRT less than 3 seconds Hyperkeratotic lesions are present to the distal aspect the right second toe as well as right submetatarsal 1.  Upon debridement there is no underlying ulcerations, drainage and there is no signs of infection of the wounds appear to be healed.  No other open lesions identified at this time bilaterally.  The nails are hypertrophic, dystrophic with yellow and brown discoloration.  There is subjective irritation nails 1-5 bilaterally with shoes.  No drainage or pus or signs of infection of the toenail sites. N pain with calf compression, swelling, warmth, erythema.  Assessment: Right foot pre-ulcerative lesions; symptomatic onychomycosis  Plan: -All treatment options discussed with the patient including all alternatives, risks, complications -I sharply debrided both the hyperkeratotic lesions and wounds x2 on the right foot without any complications or bleeding. Discussed moisturizer to the areas daily.  Offloading. -Nails debrided x10 without any complications or bleeding. -Discussed the importance of daily foot inspection.  [name] DPM

## 2018-03-31 ENCOUNTER — Ambulatory Visit (INDEPENDENT_AMBULATORY_CARE_PROVIDER_SITE_OTHER): Payer: BLUE CROSS/BLUE SHIELD | Admitting: Podiatry

## 2018-03-31 DIAGNOSIS — E1149 Type 2 diabetes mellitus with other diabetic neurological complication: Secondary | ICD-10-CM | POA: Diagnosis not present

## 2018-03-31 DIAGNOSIS — L97511 Non-pressure chronic ulcer of other part of right foot limited to breakdown of skin: Secondary | ICD-10-CM

## 2018-03-31 MED ORDER — MUPIROCIN 2 % EX OINT
1.0000 | TOPICAL_OINTMENT | Freq: Two times a day (BID) | CUTANEOUS | 2 refills | Status: DC
Start: 2018-03-31 — End: 2019-04-28

## 2018-03-31 MED ORDER — DOXYCYCLINE HYCLATE 100 MG PO TABS: 100.0000 mg | ORAL_TABLET | Freq: Two times a day (BID) | ORAL | 0 refills | Status: DC

## 2018-04-05 NOTE — Progress Notes (Signed)
Subjective: 57 year old male presents the office today for evaluation of pre-ulcerative calluses.  He states he is developed a blister on the right second toe since I last saw him.  His wife has been moisturizing areas as well as keeping a bandage on them daily even prior to this new development.  Denies any drainage or pus or any pain.  No other concerns. Denies any systemic complaints such as fevers, chills, nausea, vomiting. No acute changes since last appointment, and no other complaints at this time.   Objective: AAO x3, NAD DP/PT pulses palpable bilaterally, CRT less than 3 seconds Old blisters present the distal aspect of the right second toe.  I debrided this remove all the epidermalysis.  There is no exposed bone there is macerated tissue.  There is no fluctuation crepitation any malodor.  No ascending cellulitis.  Minimal callus formation submetatarsal 1 bilaterally.  No open sores otherwise.  Mild swelling to the toe. No open lesions or pre-ulcerative lesions.  No pain with calf compression, swelling, warmth, erythema  Assessment: Wound right second toe/blister  Plan: -All treatment options discussed with the patient including all alternatives, risks, complications.  -I debrided all the loose skin today.  Will use a small meta Betadine to the area daily.  There is a mild swelling to the toe.  Prescribed doxycycline.  Continue offloading at all times.  Monitor for any signs or symptoms of infection. -Monitor for any clinical signs or symptoms of infection and directed to call the office immediately should any occur or go to the ER. -Patient encouraged to call the office with any questions, concerns, change in symptoms.   [name] DPM

## 2018-04-10 ENCOUNTER — Ambulatory Visit (INDEPENDENT_AMBULATORY_CARE_PROVIDER_SITE_OTHER): Payer: BLUE CROSS/BLUE SHIELD | Admitting: Podiatry

## 2018-04-10 ENCOUNTER — Encounter: Payer: Self-pay | Admitting: Podiatry

## 2018-04-10 VITALS — BP 136/69 | HR 77 | Temp 98.6°F | Resp 16

## 2018-04-10 DIAGNOSIS — L97511 Non-pressure chronic ulcer of other part of right foot limited to breakdown of skin: Secondary | ICD-10-CM

## 2018-04-10 DIAGNOSIS — E1149 Type 2 diabetes mellitus with other diabetic neurological complication: Secondary | ICD-10-CM | POA: Diagnosis not present

## 2018-04-11 ENCOUNTER — Telehealth: Payer: Self-pay | Admitting: Podiatry

## 2018-04-11 NOTE — Telephone Encounter (Signed)
Called pt with benefit information for orthotics and diabetic shoes and inserts. Both are covered @ 80% after 1750.00 deductible(pt has met 1092.49)for in network. Only covered if diabetic. In network  Out of pocket is 6850.00(pt has met 1242.61) Out of network covered @ 50% after 3500.00 deductible(pt has met 0)  Out of network out of pocket max is unlimited...  °Pt wants to wait until he has met his deductible and will schedule an appt at that time. °

## 2018-04-12 NOTE — Progress Notes (Signed)
Subjective: 58 year old male presents the office today for follow-up evaluation of a wound of the right second toe.  He states there is been no drainage and there is no odor and overall is been doing much better.  He is finished his antibiotics.  He has been keeping small amount of Betadine on the area daily.  Denies any increase in swelling redness or any red streaks. Denies any systemic complaints such as fevers, chills, nausea, vomiting. No acute changes since last appointment, and no other complaints at this time.   Objective: AAO x3, NAD DP/PT pulses palpable bilaterally, CRT less than 3 seconds Hammertoe deformities are present.  This hyperkeratotic lesion the distal aspect of the right second toe.  Upon debridement there is no ongoing ulceration this appears to be healed today.  The no drainage or pus.  No fluctuation crepitation any malodor.  No open lesions or pre-ulcerative lesions.  No pain with calf compression, swelling, warmth, erythema  Assessment: Healed wound right second toe  Plan: -All treatment options discussed with the patient including all alternatives, risks, complications.  -Debrided the hyperkeratotic tissue without any complications or bleeding.  Ultimately discussed with arthroplasty of the toe to help take pressure off the distal portion of the toe but he is feel that he come off of work for this and he cannot do it currently.  Continue offloading.  Moisturizer daily.  Monitor for any signs or symptoms of infection. -We will check on diabetic shoes, inserts. -Patient encouraged to call the office with any questions, concerns, change in symptoms.   [name] DPM

## 2018-04-21 ENCOUNTER — Ambulatory Visit (INDEPENDENT_AMBULATORY_CARE_PROVIDER_SITE_OTHER): Payer: BLUE CROSS/BLUE SHIELD | Admitting: Podiatry

## 2018-04-21 DIAGNOSIS — L84 Corns and callosities: Secondary | ICD-10-CM | POA: Diagnosis not present

## 2018-04-21 DIAGNOSIS — E1149 Type 2 diabetes mellitus with other diabetic neurological complication: Secondary | ICD-10-CM | POA: Diagnosis not present

## 2018-04-22 NOTE — Progress Notes (Signed)
Subjective: 58 year old male presents the office today for follow-up evaluation of a wound of the right second toe.  He states that overall he is doing well is healed.  He has not seen concerning swelling or redness.  The other areas of callus have been doing well and his wife is been keeping moisturizer on them.  He keeps a bandage on the second toe.  He has no other concerns.  Denies any fevers, chills, nausea, vomiting.  No acute changes since last appointment, and no other complaints at this time.   Objective: AAO x3, NAD DP/PT pulses palpable bilaterally, CRT less than 3 seconds Hammertoe deformities are present.  This hyperkeratotic lesion the distal aspect of the right second toe.  Upon debridement there is no definitive underlying ulceration.  There is mild to no contracture present resulting in the hyperkeratotic lesion at the distal portion of the toe.  Other hyperkeratotic lesions these have previously doing very well and there is no significant tissue to debride today.  No other open lesions or pre-ulcerative lesions.  No pain with calf compression, swelling, warmth, erythema.   Assessment: Pre-ulcerative area right second toe with flexion contracture of the toe  Plan: -All treatment options discussed with the patient including all alternatives, risks, complications..  Continue offloading.. I again discussed with him surgical intervention he wants to hold off now because he cannot take time off of work.  I do think that we need to do this to help prevent amputation. -Monitor for any clinical signs or symptoms of infection and directed to call the office immediately should any occur or go to the ER.  [name] DPM

## 2018-05-08 ENCOUNTER — Ambulatory Visit: Payer: BLUE CROSS/BLUE SHIELD | Admitting: Podiatry

## 2018-06-02 ENCOUNTER — Encounter: Payer: Self-pay | Admitting: Podiatry

## 2018-06-02 ENCOUNTER — Ambulatory Visit (INDEPENDENT_AMBULATORY_CARE_PROVIDER_SITE_OTHER): Payer: BLUE CROSS/BLUE SHIELD | Admitting: Podiatry

## 2018-06-02 ENCOUNTER — Ambulatory Visit (INDEPENDENT_AMBULATORY_CARE_PROVIDER_SITE_OTHER): Payer: BLUE CROSS/BLUE SHIELD

## 2018-06-02 ENCOUNTER — Other Ambulatory Visit: Payer: Self-pay

## 2018-06-02 VITALS — Temp 97.3°F

## 2018-06-02 DIAGNOSIS — L02611 Cutaneous abscess of right foot: Secondary | ICD-10-CM | POA: Diagnosis not present

## 2018-06-02 DIAGNOSIS — L84 Corns and callosities: Secondary | ICD-10-CM

## 2018-06-02 DIAGNOSIS — M869 Osteomyelitis, unspecified: Secondary | ICD-10-CM

## 2018-06-02 MED ORDER — CLINDAMYCIN HCL 300 MG PO CAPS: 300.0000 mg | ORAL_CAPSULE | Freq: Three times a day (TID) | ORAL | 0 refills | Status: DC

## 2018-06-02 MED ORDER — CIPROFLOXACIN HCL 500 MG PO TABS: 500.0000 mg | ORAL_TABLET | Freq: Two times a day (BID) | ORAL | 0 refills | Status: DC

## 2018-06-05 LAB — WOUND CULTURE
MICRO NUMBER:: 403630
SPECIMEN QUALITY:: ADEQUATE

## 2018-06-06 ENCOUNTER — Ambulatory Visit: Payer: BLUE CROSS/BLUE SHIELD | Admitting: Podiatry

## 2018-06-06 NOTE — Progress Notes (Signed)
Subjective: 58 year old male presents the office today for concerns of drainage from his right second toe.  Called the office requesting an antibiotic but I want to see him because of this.  He states is been ongoing for the last week and not getting any better.  He has been continue to work wearing regular shoes.  There is a new callus. Denies any systemic complaints such as fevers, chills, nausea, vomiting. No acute changes since last appointment, and no other complaints at this time.   Objective: AAO x3, NAD DP/PT pulses palpable bilaterally, CRT less than 3 seconds Hyperkeratotic tissue to the distal aspect of the right second toe.  There is a blister present as well as the toe.  Upon debridement there was purulence identified and this was cultured today.  There is probing to bone to the wound that was identified his left toe.  There is edema most of the distal aspect of the toe.  No ascending cellulitis. No open lesions or pre-ulcerative lesions.  No pain with calf compression, swelling, warmth, erythema  Assessment: Chronic left, osteomyelitis right second toe  Plan: -All treatment options discussed with the patient including all alternatives, risks, complications.  -Postoperatively the hyperkeratotic tissue and the wound revealed Purulence was cultured today.  The wound does probe to bone.  X-rays were obtained and reviewed as well and there is cortical changes to the distal aspect the distal phalanx consistent with osteomyelitis.Start clindamycin and ciprofloxacin.  We discussed amputation release of the distal left toe.  We will see him back on Tuesday to further discuss.  Monitor closely for signs or symptoms of worsening infection of the ER immediately should any occur.  [name] DPM  -Patient encouraged to call the office with any questions, concerns, change in symptoms.

## 2018-06-08 ENCOUNTER — Other Ambulatory Visit: Payer: Self-pay

## 2018-06-08 ENCOUNTER — Ambulatory Visit (INDEPENDENT_AMBULATORY_CARE_PROVIDER_SITE_OTHER): Payer: BLUE CROSS/BLUE SHIELD | Admitting: Podiatry

## 2018-06-08 ENCOUNTER — Encounter: Payer: Self-pay | Admitting: Podiatry

## 2018-06-08 VITALS — Temp 97.5°F

## 2018-06-08 DIAGNOSIS — M869 Osteomyelitis, unspecified: Secondary | ICD-10-CM | POA: Diagnosis not present

## 2018-06-08 DIAGNOSIS — E1149 Type 2 diabetes mellitus with other diabetic neurological complication: Secondary | ICD-10-CM | POA: Diagnosis not present

## 2018-06-08 NOTE — Patient Instructions (Signed)

## 2018-06-12 NOTE — Progress Notes (Signed)
Subjective: 58 year old male presents the office today for evaluation of right second toe infection.  He states that he had minimal drainage from the area he thinks the toe is doing better.  He still on antibiotics.  He has been keeping Betadine on the wound daily.  Denies any increase in swelling or redness or any red streaks to his foot.  Overall he feels well denies any systemic complaints including fevers, chills, nausea, vomiting. No acute changes since last appointment, and no other complaints at this time.   Objective: AAO x3, NAD DP/PT pulses palpable bilaterally, CRT less than 3 seconds Ulceration continues the distal aspect the right second toe which probes roughly down to bone.  There is decreased edema there is no significant erythema there is no ascending cellulitis there is no drainage or pus identified today there is no fluctuation crepitation any malodor.  Hammertoe contractures present. No pain with calf compression, swelling, warmth, erythema  Assessment: Right second toe osteomyelitis  Plan: -All treatment options discussed with the patient including all alternatives, risks, complications.  -Sharply debrided the nonviable soft tissue today utilizing a tissue nipper and #12 blade scalpel any complications. -Given blister probes to bone given the infection I recommended is partial amputation of the toe.  Hopefully beginning amputate just the distal portion of the toe but he is at risk of losing his higher toe for further amputation he understands. -Plan for partial toe amputation right second toe next week. -The incision placement as well as the postoperative course was discussed with the patient. I discussed risks of the surgery which include, but not limited to, infection, bleeding, pain, swelling, need for further surgery, delayed or nonhealing, painful or ugly scar, numbness or sensation changes, over/under correction, recurrence, transfer lesions, further deformity, hardware  failure, DVT/PE, loss of toe/foot. Patient understands these risks and wishes to proceed with surgery. The surgical consent was reviewed with the patient all 3 pages were signed. No promises or guarantees were given to the outcome of the procedure. All questions were answered to the best of my ability. Before the surgery the patient was encouraged to call the office if there is any further questions. The surgery will be performed at the [facility] on an outpatient basis. -Continue antibiotics  -Patient encouraged to call the office with any questions, concerns, change in symptoms.

## 2018-06-13 ENCOUNTER — Telehealth: Payer: Self-pay | Admitting: *Deleted

## 2018-06-13 NOTE — Telephone Encounter (Signed)
DOS 06/14/2018,  AMPUTATION TOE INTERPHALANGEAL PARTIAL 2ND RT,  CPT CODE 62035  BCBS PRE-CERT NOT REQUIRED  Policy Effective : 59/74/1638 - 02/14/2198   In-Network    Max Per Benefit Period Year-to-Date Remaining  CoInsurance     Deductible  $1,750.00 $0.00  Out-Of-Pocket  $6,850.00 $4,877.09    In-Network Individual  Copay Coinsurance  Not Applicable  45%   Messages: PHYSICIAN BENEFIT   Place of Service: [facility]

## 2018-06-14 ENCOUNTER — Other Ambulatory Visit: Payer: Self-pay | Admitting: Podiatry

## 2018-06-14 ENCOUNTER — Encounter: Payer: Self-pay | Admitting: Podiatry

## 2018-06-14 DIAGNOSIS — M869 Osteomyelitis, unspecified: Secondary | ICD-10-CM

## 2018-06-14 MED ORDER — DOXYCYCLINE HYCLATE 100 MG PO TABS: 100.0000 mg | ORAL_TABLET | Freq: Two times a day (BID) | ORAL | 0 refills | Status: DC

## 2018-06-14 MED ORDER — PROMETHAZINE HCL 25 MG PO TABS: 25.0000 mg | ORAL_TABLET | Freq: Three times a day (TID) | ORAL | 0 refills | Status: DC | PRN

## 2018-06-14 MED ORDER — HYDROCODONE-ACETAMINOPHEN 5-325 MG PO TABS: 1.0000 | ORAL_TABLET | Freq: Four times a day (QID) | ORAL | 0 refills | Status: DC | PRN

## 2018-06-14 NOTE — Progress Notes (Signed)
Post-op medications sent to the pharmacy.  

## 2018-06-15 ENCOUNTER — Encounter: Payer: Self-pay | Admitting: Podiatry

## 2018-06-15 NOTE — Telephone Encounter (Signed)
Left message informing pt if the dressing was wet and dirty he should call our office to get in to have it changed.

## 2018-06-16 ENCOUNTER — Telehealth: Payer: Self-pay | Admitting: *Deleted

## 2018-06-16 NOTE — Telephone Encounter (Signed)
Called and left a message for the patient to call me back and left my call back number at [phone]. Roberto Allen

## 2018-06-20 ENCOUNTER — Ambulatory Visit (INDEPENDENT_AMBULATORY_CARE_PROVIDER_SITE_OTHER): Payer: Self-pay | Admitting: Podiatry

## 2018-06-20 ENCOUNTER — Ambulatory Visit (INDEPENDENT_AMBULATORY_CARE_PROVIDER_SITE_OTHER): Payer: BLUE CROSS/BLUE SHIELD

## 2018-06-20 ENCOUNTER — Other Ambulatory Visit: Payer: Self-pay

## 2018-06-20 VITALS — Temp 97.3°F

## 2018-06-20 DIAGNOSIS — M869 Osteomyelitis, unspecified: Secondary | ICD-10-CM

## 2018-06-20 DIAGNOSIS — S90222A Contusion of left lesser toe(s) with damage to nail, initial encounter: Secondary | ICD-10-CM

## 2018-06-20 DIAGNOSIS — L02611 Cutaneous abscess of right foot: Secondary | ICD-10-CM

## 2018-06-27 ENCOUNTER — Encounter: Payer: Self-pay | Admitting: Podiatry

## 2018-06-27 ENCOUNTER — Ambulatory Visit (INDEPENDENT_AMBULATORY_CARE_PROVIDER_SITE_OTHER): Payer: BLUE CROSS/BLUE SHIELD | Admitting: Podiatry

## 2018-06-27 ENCOUNTER — Other Ambulatory Visit: Payer: Self-pay

## 2018-06-27 VITALS — Temp 97.5°F

## 2018-06-27 DIAGNOSIS — L02611 Cutaneous abscess of right foot: Secondary | ICD-10-CM

## 2018-06-27 DIAGNOSIS — Z09 Encounter for follow-up examination after completed treatment for conditions other than malignant neoplasm: Secondary | ICD-10-CM

## 2018-06-27 DIAGNOSIS — M869 Osteomyelitis, unspecified: Secondary | ICD-10-CM

## 2018-06-28 ENCOUNTER — Encounter: Payer: Self-pay | Admitting: Podiatry

## 2018-06-28 NOTE — Progress Notes (Signed)
Subjective: Roberto Allen is a 58 y.o. is seen today in office s/p right partial second toe amputation, incision and drainage right hallux toenail preformed last Wednesday.  He states he is doing well his pain is controlled he only took pain medication for 2 days.  He is currently on taking Tylenol.  He still been wearing the offloading shoe.  He denies any fevers, chills, nausea, vomiting.  No calf pain, chest pain, shortness of breath he has no other concerns today.   Objective: General: No acute distress, AAOx3  DP/PT pulses palpable 2/4, CRT < 3 sec to all digits.  RIGHT foot: Incision is well coapted without any evidence of dehiscence and sutures are intact of the second digit. There is no surrounding erythema, ascending cellulitis, fluctuance, crepitus, malodor, drainage/purulence. There is mild edema around the surgical site. There is no pain along the surgical site.  Along the right hallux toenail small meta granulation tissue present in the nail corner where the nail was removed.  There is no drainage or pus.  No erythema, ascending cellulitis.  No fluctuation or crepitation or malodor. Small amount of subungual hematoma present to the hallux toenail on the left side without any extension of any hyperpigmentation into the surrounding skin.  There is no pain the nail there is no edema, erythema, drainage or pus or any clinical signs of infection noted today. No other areas of tenderness to bilateral lower extremities.  No other open lesions or pre-ulcerative lesions.  No pain with calf compression, swelling, warmth, erythema.   Assessment and Plan:  Status post right foot surgery, doing well with no complications   -Treatment options discussed including all alternatives, risks, and complications -Regards to the hallux toenail with the continue with cleaning the wound with antibacterial soap and drying thoroughly and apply antibiotic ointment and a bandage daily.  They have been doing this  as instructed. -Betadine was applied to the second toe followed by dry sterile dressing.  Keep the dressing clean, dry, intact. -We will continue to monitor left hallux toenail. -Finish course of antibiotics -Ice/elevation -Pain medication as needed. -Monitor for any clinical signs or symptoms of infection and DVT/PE and directed to call the office immediately should any occur or go to the ER. -Follow-up as scheduled or sooner if any problems arise. In the meantime, encouraged to call the office with any questions, concerns, change in symptoms.   Celesta Gentile, DPM

## 2018-06-30 ENCOUNTER — Encounter: Payer: Self-pay | Admitting: Podiatry

## 2018-07-02 ENCOUNTER — Encounter: Payer: Self-pay | Admitting: Podiatry

## 2018-07-02 NOTE — Progress Notes (Signed)
Subjective: Roberto Allen is a 58 y.o. is seen today in office s/p right partial second toe amputation, incision and drainage right hallux.  He states that overall he is doing well.  She has no pain and not taking any pain medication at this time.  He has been in the cam boot on the right side.  The dispensed antibiotics.  He denies any fevers, chills, nausea, vomiting.  No calf pain, chest pain, shortness of breath.   Objective: General: No acute distress, AAOx3  DP/PT pulses palpable 2/4, CRT < 3 sec to all digits.  RIGHT foot: Incision is well coapted without any evidence of dehiscence and sutures are intact of the second digit.  However just inferior to this area is a superficial area of skin breakdown she also appears that there was some rubbing possibly from the bandage.  There is superficial with a granular base there is almost an abrasion type lesion.  No probing, amount or tunneling.  There is decreased edema to the toe there is no erythema or warmth.  No ascending cellulitis.  Scab is present of the right medial hallux nail border.  No edema, erythema, drainage or pus or any signs of infection.  No other open lesions identified at this time.   No other areas of tenderness to bilateral lower extremities.  No other open lesions or pre-ulcerative lesions.  No pain with calf compression, swelling, warmth, erythema.   Assessment and Plan:  Status post right foot surgery, doing well with no complications   -Treatment options discussed including all alternatives, risks, and complications -Right hallux toenail is doing well.  Continue to wash with antibacterial soap keep antibacterial ointment and a bandage on the area daily.-Remove 2 of the sutures today of the right second toe.  Betadine was applied to the wound as well the incision.  We offloaded the area of the wound.  Discussed with him every other day dressing changes with a small amount of antibiotic ointment.  Continue cam boot for now.   Continue to elevate.  Monitoring signs or symptoms of infection.  RTC 1 week for suture removal or sooner if needed  [name] DPM

## 2018-07-04 ENCOUNTER — Encounter: Payer: BLUE CROSS/BLUE SHIELD | Admitting: Podiatry

## 2018-07-06 ENCOUNTER — Encounter: Payer: Self-pay | Admitting: Podiatry

## 2018-07-06 ENCOUNTER — Other Ambulatory Visit: Payer: Self-pay

## 2018-07-06 ENCOUNTER — Ambulatory Visit (INDEPENDENT_AMBULATORY_CARE_PROVIDER_SITE_OTHER): Payer: BLUE CROSS/BLUE SHIELD | Admitting: Podiatry

## 2018-07-06 VITALS — Temp 96.8°F

## 2018-07-06 DIAGNOSIS — M869 Osteomyelitis, unspecified: Secondary | ICD-10-CM

## 2018-07-06 DIAGNOSIS — Z09 Encounter for follow-up examination after completed treatment for conditions other than malignant neoplasm: Secondary | ICD-10-CM

## 2018-07-12 NOTE — Progress Notes (Signed)
Subjective: Roberto Allen is a 58 y.o. is seen today in office s/p right partial second toe amputation, incision and drainage right hallux.  He presents today remove the remainder of the sutures.  He seems to be doing well he is having no pain.  Still in the offloading shoe.  Any concerns today.  He denies any fevers, chills, nausea, vomiting.  No calf pain, chest pain, shortness of breath.   Objective: General: No acute distress, AAOx3  DP/PT pulses palpable 2/4, CRT < 3 sec to all digits.  RIGHT foot: Incision is well coapted without any evidence of dehiscence and sutures are intact of the second digit.  There is scab overlying the area.  Is able to remove the remaining sutures today and the incision made well coapted.  Unable to debride some of the skin as well and the incision appears to be healing well.  The area of superficial breakdown adjacent to the incision last appointment is healed.  Minimal edema to the toe and there is no erythema or warmth.  No clinical signs of infection.  Right hallux is doing well.  Scab is over an area and no drainage or pus. No other areas of tenderness to bilateral lower extremities.  No other open lesions or pre-ulcerative lesions.  No pain with calf compression, swelling, warmth, erythema.   Assessment and Plan:  Status post right foot surgery, doing well with no complications   -Treatment options discussed including all alternatives, risks, and complications -I remove remainder of the sutures today without any complications or bleeding.  Is also able to debride some of the scab to make sure that the incision is healed well. Continue with surgical shoe for the next week and then slowly transition back into regular shoe once we are sure that the incision is well-healed.  On a hold off on blood work conditional 2 weeks to make sure that everything is healed before going back to work so he can be full duty.  Continue to monitor for any signs or symptoms of  infection or skin breakdown.  [name] DPM

## 2018-07-17 ENCOUNTER — Encounter: Payer: Self-pay | Admitting: Podiatry

## 2018-07-17 ENCOUNTER — Other Ambulatory Visit: Payer: Self-pay

## 2018-07-17 ENCOUNTER — Ambulatory Visit (INDEPENDENT_AMBULATORY_CARE_PROVIDER_SITE_OTHER): Payer: BLUE CROSS/BLUE SHIELD | Admitting: Podiatry

## 2018-07-17 VITALS — Temp 98.1°F

## 2018-07-17 DIAGNOSIS — Z09 Encounter for follow-up examination after completed treatment for conditions other than malignant neoplasm: Secondary | ICD-10-CM

## 2018-07-17 DIAGNOSIS — M869 Osteomyelitis, unspecified: Secondary | ICD-10-CM

## 2018-07-17 NOTE — Progress Notes (Signed)
Subjective: Roberto Allen is a 58 y.o. is seen today in office s/p right partial second toe amputation, incision and drainage right hallux.  Overall he states he is doing well.  There may been a small amount of drainage on Wednesday but also he states it may have been the antibiotic ointment.  Currently denies any drainage, redness, swelling.  He still in surgical shoe. He denies any fevers, chills, nausea, vomiting.  No calf pain, chest pain, shortness of breath.   Objective: General: No acute distress, AAOx3  DP/PT pulses palpable 2/4, CRT < 3 sec to all digits.  RIGHT foot: Incision is well coapted without any evidence of dehiscence and a scar is formed.  There is no open lesions identified today.  There is no drainage or pus.  There is no edema, erythema, increase in warmth.  No fluctuation crepitation or malodor.  On the right hallux to the area is healed.  Some mild hyperkeratotic tissue but no open ulceration or signs of infection. No other open lesions identified bilaterally. No other open lesions or pre-ulcerative lesions.  No pain with calf compression, swelling, warmth, erythema.   Assessment and Plan:  Status post right foot surgery, doing well with no complications   -Treatment options discussed including all alternatives, risks, and complications -Overall incision is healed and the procedure sites have healed.  Doing to continue a bandage for protection overlying the areas.  He can start a transition back into a shoe make a gradual transition and monitor daily for any irritation.  If there is any skin irritation and breakdown the back of the surgical shoe having no immediately.  We will see him back in about 7 to 10 days.  At that point he is back into a shoe return to work.  [name] DPM

## 2018-07-18 ENCOUNTER — Encounter: Payer: Self-pay | Admitting: Podiatry

## 2018-07-31 ENCOUNTER — Other Ambulatory Visit: Payer: Self-pay

## 2018-07-31 ENCOUNTER — Ambulatory Visit (INDEPENDENT_AMBULATORY_CARE_PROVIDER_SITE_OTHER): Payer: BLUE CROSS/BLUE SHIELD | Admitting: Podiatry

## 2018-07-31 ENCOUNTER — Encounter: Payer: Self-pay | Admitting: Podiatry

## 2018-07-31 ENCOUNTER — Ambulatory Visit (INDEPENDENT_AMBULATORY_CARE_PROVIDER_SITE_OTHER): Payer: BLUE CROSS/BLUE SHIELD

## 2018-07-31 VITALS — Temp 98.3°F

## 2018-07-31 DIAGNOSIS — M869 Osteomyelitis, unspecified: Secondary | ICD-10-CM

## 2018-07-31 DIAGNOSIS — Z09 Encounter for follow-up examination after completed treatment for conditions other than malignant neoplasm: Secondary | ICD-10-CM

## 2018-07-31 NOTE — Progress Notes (Signed)
Subjective: Abdulahad Mederos is a 58 y.o. is seen today in office s/p right partial second toe amputation, incision and drainage right hallux.  He states that he is doing well. He started to wear a regular shoe last week. No drainage or swelling. No pain. He denies any fevers, chills, nausea, vomiting.  No calf pain, chest pain, shortness of breath.   Objective: General: No acute distress, AAOx3  DP/PT pulses palpable 2/4, CRT < 3 sec to all digits.  RIGHT foot: Incision is well coapted without any evidence of dehiscence and a scar is formed.  There is no edema, erythema.  Hammertoe contractures present of the digits with minimal hyperkeratotic tissue the distal aspect of the third toe.  No ulceration identified there is no signs of infection.  There is no significant callus formation submetatarsal 1. No other open lesions identified bilaterally. No other open lesions or pre-ulcerative lesions.  No pain with calf compression, swelling, warmth, erythema.   Assessment and Plan:  Status post right foot surgery, doing well with no complications - Healed  -Treatment options discussed including all alternatives, risks, and complications -X-rays were obtained today.  Remodeling present of the middle phalanx.  No definitive osteomyelitis is identified in the soft tissue present.  Clinically the foot appears to be healed no signs of infection.  He is back on a regular shoe.  Is planning to discharge him in the postoperative care.  However I do want him in 6 weeks to make sure that the third toe is doing well. -Monitor for any clinical signs or symptoms of infection and directed to call the office immediately should any occur or go to the ER.  RTC 6 weeks or sooner if needed  [name] DPM

## 2018-08-01 ENCOUNTER — Encounter: Payer: BLUE CROSS/BLUE SHIELD | Admitting: Podiatry

## 2018-09-14 ENCOUNTER — Ambulatory Visit: Payer: BLUE CROSS/BLUE SHIELD | Admitting: Podiatry

## 2018-09-15 ENCOUNTER — Ambulatory Visit: Payer: BLUE CROSS/BLUE SHIELD | Admitting: Podiatry

## 2018-09-22 ENCOUNTER — Other Ambulatory Visit: Payer: Self-pay

## 2018-09-22 ENCOUNTER — Ambulatory Visit (INDEPENDENT_AMBULATORY_CARE_PROVIDER_SITE_OTHER): Payer: BC Managed Care – PPO | Admitting: Podiatry

## 2018-09-22 DIAGNOSIS — E1149 Type 2 diabetes mellitus with other diabetic neurological complication: Secondary | ICD-10-CM

## 2018-09-22 DIAGNOSIS — L84 Corns and callosities: Secondary | ICD-10-CM | POA: Diagnosis not present

## 2018-09-22 NOTE — Progress Notes (Signed)
Subjective: 58 year old male presents the office today for follow-up evaluation of pre-ulcerative calluses to both of his feet.  He states he is doing well and is having no new issues. second toe has been doing well.  Denies any open sores or any drainage. Denies any systemic complaints such as fevers, chills, nausea, vomiting. No acute changes since last appointment, and no other complaints at this time.   Objective: AAO x3, NAD DP/PT pulses palpable bilaterally, CRT less than 3 seconds Pre-ulcerative hyperkeratotic lesions bilateral submetatarsal 1 right side worse than left as well as small calluses at the distal aspect of the right third, left second toe.  Upon debridement no ongoing ulceration, drainage or any clinical signs of infection noted today.  Amputation site right second toe well-healed.  No open lesions or pre-ulcerative lesions.  No pain with calf compression, swelling, warmth, erythema  Assessment: Pre-ulcerative calluses  Plan: -All treatment options discussed with the patient including all alternatives, risks, complications.  -Lesion sharply debrided x4 without any complications or bleeding.  Continue offloading.  Recommend small meta moisturizer daily. -Discussed the importance of daily foot inspection. -Patient encouraged to call the office with any questions, concerns, change in symptoms.

## 2018-11-23 ENCOUNTER — Ambulatory Visit: Payer: BC Managed Care – PPO | Admitting: Podiatry

## 2018-12-08 ENCOUNTER — Ambulatory Visit: Payer: BC Managed Care – PPO | Admitting: Podiatry

## 2019-01-05 ENCOUNTER — Ambulatory Visit: Payer: BC Managed Care – PPO

## 2019-01-05 ENCOUNTER — Ambulatory Visit (INDEPENDENT_AMBULATORY_CARE_PROVIDER_SITE_OTHER): Payer: BC Managed Care – PPO | Admitting: Podiatry

## 2019-01-05 ENCOUNTER — Other Ambulatory Visit: Payer: Self-pay

## 2019-01-05 ENCOUNTER — Ambulatory Visit: Payer: BC Managed Care – PPO | Admitting: Podiatry

## 2019-01-05 DIAGNOSIS — L84 Corns and callosities: Secondary | ICD-10-CM | POA: Diagnosis not present

## 2019-01-05 DIAGNOSIS — E119 Type 2 diabetes mellitus without complications: Secondary | ICD-10-CM

## 2019-01-05 DIAGNOSIS — E1149 Type 2 diabetes mellitus with other diabetic neurological complication: Secondary | ICD-10-CM

## 2019-01-05 NOTE — Patient Instructions (Signed)
Diabetes Mellitus and Foot Care Foot care is an important part of your health, especially when you have diabetes. Diabetes may cause you to have problems because of poor blood flow (circulation) to your feet and legs, which can cause your skin to:  Become thinner and drier.  Break more easily.  Heal more slowly.  Peel and crack. You may also have nerve damage (neuropathy) in your legs and feet, causing decreased feeling in them. This means that you may not notice minor injuries to your feet that could lead to more serious problems. Noticing and addressing any potential problems early is the best way to prevent future foot problems. How to care for your feet Foot hygiene  Wash your feet daily with warm water and mild soap. Do not use hot water. Then, pat your feet and the areas between your toes until they are completely dry. Do not soak your feet as this can dry your skin.  Trim your toenails straight across. Do not dig under them or around the cuticle. File the edges of your nails with an emery board or nail file.  Apply a moisturizing lotion or petroleum jelly to the skin on your feet and to dry, brittle toenails. Use lotion that does not contain alcohol and is unscented. Do not apply lotion between your toes. Shoes and socks  Wear clean socks or stockings every day. Make sure they are not too tight. Do not wear knee-high stockings since they may decrease blood flow to your legs.  Wear shoes that fit properly and have enough cushioning. Always look in your shoes before you put them on to be sure there are no objects inside.  To break in new shoes, wear them for just a few hours a day. This prevents injuries on your feet. Wounds, scrapes, corns, and calluses  Check your feet daily for blisters, cuts, bruises, sores, and redness. If you cannot see the bottom of your feet, use a mirror or ask someone for help.  Do not cut corns or calluses or try to remove them with medicine.  If you  find a minor scrape, cut, or break in the skin on your feet, keep it and the skin around it clean and dry. You may clean these areas with mild soap and water. Do not clean the area with peroxide, alcohol, or iodine.  If you have a wound, scrape, corn, or callus on your foot, look at it several times a day to make sure it is healing and not infected. Check for: ? Redness, swelling, or pain. ? Fluid or blood. ? Warmth. ? Pus or a bad smell. General instructions  Do not cross your legs. This may decrease blood flow to your feet.  Do not use heating pads or hot water bottles on your feet. They may burn your skin. If you have lost feeling in your feet or legs, you may not know this is happening until it is too late.  Protect your feet from hot and cold by wearing shoes, such as at the beach or on hot pavement.  Schedule a complete foot exam at least once a year (annually) or more often if you have foot problems. If you have foot problems, report any cuts, sores, or bruises to your health care provider immediately. Contact a health care provider if:  You have a medical condition that increases your risk of infection and you have any cuts, sores, or bruises on your feet.  You have an injury that is not   healing.  You have redness on your legs or feet.  You feel burning or tingling in your legs or feet.  You have pain or cramps in your legs and feet.  Your legs or feet are numb.  Your feet always feel cold.  You have pain around a toenail. Get help right away if:  You have a wound, scrape, corn, or callus on your foot and: ? You have pain, swelling, or redness that gets worse. ? You have fluid or blood coming from the wound, scrape, corn, or callus. ? Your wound, scrape, corn, or callus feels warm to the touch. ? You have pus or a bad smell coming from the wound, scrape, corn, or callus. ? You have a fever. ? You have a red line going up your leg. Summary  Check your feet every day  for cuts, sores, red spots, swelling, and blisters.  Moisturize feet and legs daily.  Wear shoes that fit properly and have enough cushioning.  If you have foot problems, report any cuts, sores, or bruises to your health care provider immediately.  Schedule a complete foot exam at least once a year (annually) or more often if you have foot problems. This information is not intended to replace advice given to you by your health care provider. Make sure you discuss any questions you have with your health care provider. Document Released: 01/30/2000 Document Revised: 03/16/2017 Document Reviewed: 03/05/2016 Elsevier Patient Education  2020 [employer]  

## 2019-01-05 NOTE — Progress Notes (Signed)
Subjective: 58 year old male presents the office today for diabetic foot evaluation.  He states his feet are doing great and not having any issues.  He denies any open sores.  His wife continues to get moisturizer on the calluses daily.  No swelling or redness.  No pain. Denies any systemic complaints such as fevers, chills, nausea, vomiting. No acute changes since last appointment, and no other complaints at this time.   Objective: AAO x3, NAD DP/PT pulses palpable bilaterally, CRT less than 3 seconds Sensation decreased Semmes-Weinstein monofilament Nails are mildly hypertrophic, dystrophic.  There is no pain in the nails no redness or drainage or any swelling.  Evidence of previous callus on the right foot submetatarsal 1 but no significant hyperkeratotic tissue to debride today.  Pinch callus on the right fifth toe.  No open sores. No pain with calf compression, swelling, warmth, erythema  Assessment: Diabetic foot evaluation  Plan: -All treatment options discussed with the patient including all alternatives, risks, complications.  -I did smooth the nails today without any complications or bleeding.  Debrided some minimal hyperkeratotic lesion on the right fifth toe without any complications or bleeding.  Continue to check feet daily limit others any issues.  Otherwise I will see him back in 6 months. -Patient encouraged to call the office with any questions, concerns, change in symptoms.   [name] DPM

## 2019-03-28 ENCOUNTER — Ambulatory Visit (INDEPENDENT_AMBULATORY_CARE_PROVIDER_SITE_OTHER): Payer: BC Managed Care – PPO

## 2019-03-28 ENCOUNTER — Other Ambulatory Visit: Payer: Self-pay

## 2019-03-28 ENCOUNTER — Ambulatory Visit (HOSPITAL_COMMUNITY)
Admission: EM | Admit: 2019-03-28 | Discharge: 2019-03-28 | Disposition: A | Discharge status: Home / Self Care | Payer: BC Managed Care – PPO | Attending: Urgent Care | Admitting: Urgent Care

## 2019-03-28 ENCOUNTER — Encounter (HOSPITAL_COMMUNITY): Payer: Self-pay | Admitting: Emergency Medicine

## 2019-03-28 DIAGNOSIS — Z888 Allergy status to other drugs, medicaments and biological substances status: Secondary | ICD-10-CM | POA: Insufficient documentation

## 2019-03-28 DIAGNOSIS — I509 Heart failure, unspecified: Secondary | ICD-10-CM | POA: Insufficient documentation

## 2019-03-28 DIAGNOSIS — I428 Other cardiomyopathies: Secondary | ICD-10-CM | POA: Insufficient documentation

## 2019-03-28 DIAGNOSIS — Z833 Family history of diabetes mellitus: Secondary | ICD-10-CM | POA: Diagnosis not present

## 2019-03-28 DIAGNOSIS — J449 Chronic obstructive pulmonary disease, unspecified: Secondary | ICD-10-CM | POA: Insufficient documentation

## 2019-03-28 DIAGNOSIS — N183 Chronic kidney disease, stage 3 unspecified: Secondary | ICD-10-CM | POA: Diagnosis not present

## 2019-03-28 DIAGNOSIS — R059 Cough, unspecified: Secondary | ICD-10-CM

## 2019-03-28 DIAGNOSIS — K219 Gastro-esophageal reflux disease without esophagitis: Secondary | ICD-10-CM | POA: Insufficient documentation

## 2019-03-28 DIAGNOSIS — G4733 Obstructive sleep apnea (adult) (pediatric): Secondary | ICD-10-CM | POA: Diagnosis not present

## 2019-03-28 DIAGNOSIS — R5383 Other fatigue: Secondary | ICD-10-CM | POA: Insufficient documentation

## 2019-03-28 DIAGNOSIS — J189 Pneumonia, unspecified organism: Secondary | ICD-10-CM | POA: Diagnosis present

## 2019-03-28 DIAGNOSIS — I130 Hypertensive heart and chronic kidney disease with heart failure and stage 1 through stage 4 chronic kidney disease, or unspecified chronic kidney disease: Secondary | ICD-10-CM | POA: Insufficient documentation

## 2019-03-28 DIAGNOSIS — R05 Cough: Secondary | ICD-10-CM | POA: Diagnosis present

## 2019-03-28 DIAGNOSIS — E1122 Type 2 diabetes mellitus with diabetic chronic kidney disease: Secondary | ICD-10-CM | POA: Diagnosis not present

## 2019-03-28 DIAGNOSIS — I10 Essential (primary) hypertension: Secondary | ICD-10-CM

## 2019-03-28 DIAGNOSIS — Z792 Long term (current) use of antibiotics: Secondary | ICD-10-CM | POA: Diagnosis not present

## 2019-03-28 DIAGNOSIS — Z79899 Other long term (current) drug therapy: Secondary | ICD-10-CM | POA: Insufficient documentation

## 2019-03-28 DIAGNOSIS — E875 Hyperkalemia: Secondary | ICD-10-CM | POA: Diagnosis not present

## 2019-03-28 DIAGNOSIS — Z87891 Personal history of nicotine dependence: Secondary | ICD-10-CM | POA: Diagnosis not present

## 2019-03-28 DIAGNOSIS — Z20822 Contact with and (suspected) exposure to covid-19: Secondary | ICD-10-CM | POA: Diagnosis not present

## 2019-03-28 DIAGNOSIS — Z794 Long term (current) use of insulin: Secondary | ICD-10-CM | POA: Diagnosis not present

## 2019-03-28 DIAGNOSIS — E119 Type 2 diabetes mellitus without complications: Secondary | ICD-10-CM

## 2019-03-28 DIAGNOSIS — Z803 Family history of malignant neoplasm of breast: Secondary | ICD-10-CM | POA: Insufficient documentation

## 2019-03-28 DIAGNOSIS — R0602 Shortness of breath: Secondary | ICD-10-CM | POA: Diagnosis present

## 2019-03-28 DIAGNOSIS — E781 Pure hyperglyceridemia: Secondary | ICD-10-CM | POA: Diagnosis not present

## 2019-03-28 DIAGNOSIS — Z791 Long term (current) use of non-steroidal anti-inflammatories (NSAID): Secondary | ICD-10-CM | POA: Insufficient documentation

## 2019-03-28 DIAGNOSIS — Z7982 Long term (current) use of aspirin: Secondary | ICD-10-CM | POA: Insufficient documentation

## 2019-03-28 DIAGNOSIS — I2510 Atherosclerotic heart disease of native coronary artery without angina pectoris: Secondary | ICD-10-CM | POA: Diagnosis not present

## 2019-03-28 DIAGNOSIS — R52 Pain, unspecified: Secondary | ICD-10-CM

## 2019-03-28 DIAGNOSIS — R1084 Generalized abdominal pain: Secondary | ICD-10-CM

## 2019-03-28 DIAGNOSIS — E785 Hyperlipidemia, unspecified: Secondary | ICD-10-CM | POA: Insufficient documentation

## 2019-03-28 MED ORDER — PROMETHAZINE-DM 6.25-15 MG/5ML PO SYRP: 5.0000 mL | ORAL_SOLUTION | Freq: Every evening | ORAL | 0 refills | Status: DC | PRN

## 2019-03-28 MED ORDER — CEFDINIR 300 MG PO CAPS: 300.0000 mg | ORAL_CAPSULE | Freq: Two times a day (BID) | ORAL | 0 refills | Status: DC

## 2019-03-28 MED ORDER — BENZONATATE 100 MG PO CAPS: 100.0000 mg | ORAL_CAPSULE | Freq: Three times a day (TID) | ORAL | 0 refills | Status: DC | PRN

## 2019-03-28 MED ORDER — AZITHROMYCIN 250 MG PO TABS: ORAL_TABLET | ORAL | 0 refills | Status: DC

## 2019-03-28 NOTE — ED Triage Notes (Signed)
PT reports cough, shortness of breath, joint pain, fatigue for 5 days.   History of CHF.

## 2019-03-28 NOTE — Discharge Instructions (Signed)
For sore throat or cough try using a honey-based tea. Use 3 teaspoons of honey with juice squeezed from half lemon. Place shaved pieces of ginger into 1/2-1 cup of water and warm over stove top. Then mix the ingredients and repeat every 4 hours as needed. Please take Tylenol 500mg  every 6 hours. Hydrate very well with at least 2 liters of water. Eat light meals such as soups to replenish electrolytes and soft fruits, veggies. Start an antihistamine like Zyrtec (cetirizine) at 10mg  daily for postnasal drainage, sinus congestion.  You can use these medications together with the treatment I am giving you for your pneumonia.

## 2019-03-28 NOTE — ED Provider Notes (Signed)
[place]   MRN: [id number] DOB: [birth year]  Subjective:   Roberto Allen is a 59 y.o. male presenting for 5-day history of acute onset fatigue, body aches, severe cough that elicits belly pain and shortness of breath.  Patient also has a hard time breathing when he lays down.  Has a history of chronic CHF, CAD, nonischemic cardiomyopathy, CKD stage III.  He states that he was out of his blood pressure medicine for a few weeks and was finally able to get it filled through his regular doctor 2 days ago.  Denies chest pain, diaphoresis, left arm pain, confusion, weakness.  Does not know of any direct COVID-19 contacts but he has had multiple coworkers around him miss work due to unspecified illness.  Patient has a history of smoking but is not a current smoker.  He is a well-controlled diabetic, last A1c was 5.8%.  No current facility-administered medications for this encounter.  Current Outpatient Medications:  .  atorvastatin (LIPITOR) 10 MG tablet, Take by mouth., Disp: , Rfl:  .  furosemide (LASIX) 40 MG tablet, Take 1 tablet (40 mg total) by mouth daily., Disp: 30 tablet, Rfl: 11 .  hydrALAZINE (APRESOLINE) 25 MG tablet, Take 25 mg by mouth 3 (three) times daily. , Disp: , Rfl:  .  LANTUS 100 UNIT/ML injection, Inject 40 Units into the skin at bedtime. , Disp: , Rfl:  .  lisinopril (PRINIVIL,ZESTRIL) 40 MG tablet, Take 40 mg by mouth daily. , Disp: , Rfl:  .  metoprolol succinate (TOPROL-XL) 100 MG 24 hr tablet, Take 100 mg by mouth daily. , Disp: , Rfl:  .  omeprazole (PRILOSEC) 20 MG capsule, Take 20 mg by mouth daily. , Disp: , Rfl:  .  OZEMPIC 1 MG/DOSE SOPN, Inject 1 mg into the skin every Saturday. , Disp: , Rfl:  .  tadalafil (CIALIS) 20 MG tablet, , Disp: , Rfl:  .  ACCU-CHEK FASTCLIX LANCETS MISC, USE TO MONITOR BLOOD GLUCOSE 3 TIME(S) DAILY, Disp: , Rfl: 5 .  amLODipine (NORVASC) 5 MG tablet, TAKE 1 TABLET BY MOUTH DAILY, Disp: , Rfl:  .  aspirin 81 MG tablet, Take 81  mg by mouth daily., Disp: , Rfl:  .  BD PEN NEEDLE NANO U/F 32G X 4 MM MISC, 1 EACH BY DOES NOT APPLY ROUTE 2 (TWO) TIMES DAILY., Disp: , Rfl: 3 .  Blood Glucose Monitoring Suppl (ACCU-CHEK GUIDE) w/Device KIT, 3 TIMES A DAY. USE AS DIRECTED BY PHYSICIAN TO MONITOR BLOOD SUGARS, Disp: , Rfl:  .  ciprofloxacin (CIPRO) 500 MG tablet, Take 1 tablet (500 mg total) by mouth 2 (two) times daily., Disp: 20 tablet, Rfl: 0 .  clindamycin (CLEOCIN) 300 MG capsule, Take 1 capsule (300 mg total) by mouth 3 (three) times daily., Disp: 30 capsule, Rfl: 0 .  doxycycline (VIBRA-TABS) 100 MG tablet, Take 1 tablet (100 mg total) by mouth 2 (two) times daily., Disp: 14 tablet, Rfl: 0 .  fenofibrate (TRICOR) 48 MG tablet, Take 1 tablet (48 mg total) by mouth daily., Disp: 30 tablet, Rfl: 6 .  glucose blood (ONETOUCH VERIO) test strip, 4-5 TIMES PER DAY, Disp: , Rfl:  .  HYDROcodone-acetaminophen (NORCO/VICODIN) 5-325 MG tablet, Take 1 tablet by mouth every 6 (six) hours as needed., Disp: 15 tablet, Rfl: 0 .  Insulin Degludec 200 UNIT/ML SOPN, Inject into the skin., Disp: , Rfl:  .  isosorbide mononitrate (IMDUR) 120 MG 24 hr tablet, TAKE 1 TABLET (120 MG TOTAL) BY MOUTH DAILY. (  Patient taking differently: Take 120 mg by mouth daily. ), Disp: 30 tablet, Rfl: 8 .  levofloxacin (LEVAQUIN) 250 MG tablet, Take 250 mg by mouth daily., Disp: , Rfl:  .  meloxicam (MOBIC) 15 MG tablet, TAKE ONE TABLET (15 MG DOSE) BY MOUTH DAILY., Disp: , Rfl: 1 .  METOPROLOL SUCCINATE ER PO, , Disp: , Rfl:  .  mupirocin ointment (BACTROBAN) 2 %, Apply 1 application topically 2 (two) times daily., Disp: 30 g, Rfl: 2 .  pravastatin (PRAVACHOL) 20 MG tablet, Take 20 mg by mouth at bedtime. , Disp: , Rfl:  .  promethazine (PHENERGAN) 25 MG tablet, Take 1 tablet (25 mg total) by mouth every 8 (eight) hours as needed for nausea or vomiting., Disp: 20 tablet, Rfl: 0 .  silver sulfADIAZINE (SILVADENE) 1 % cream, Apply 1 application topically daily.,  Disp: 50 g, Rfl: 0 .  silver sulfADIAZINE (SILVADENE) 1 % cream, Apply 1 application topically daily., Disp: 50 g, Rfl: 0 .  tiZANidine (ZANAFLEX) 4 MG capsule, Take 4 mg by mouth every 6 (six) hours as needed., Disp: , Rfl:  .  torsemide (DEMADEX) 20 MG tablet, Take 20 mg by mouth daily., Disp: , Rfl:  .  traZODone (DESYREL) 50 MG tablet, Take 0.5-1 tablets (25-50 mg total) by mouth at bedtime as needed for sleep., Disp: 30 tablet, Rfl: 1 .  TRESIBA FLEXTOUCH 200 UNIT/ML SOPN, INJECT 0.28 MLS (56 UNITS DOSE) INTO THE SKIN DAILY., Disp: , Rfl: 1   Allergies  Allergen Reactions  . Metformin And Related Other (See Comments)    Due to CHF and CRI    Past Medical History:  Diagnosis Date  . CAD (coronary artery disease)    a. LHC 4/13: mD1 25%, oD2 25%, pOM1 25%, pRCA 25%, dRCA 40%, EF 30%  . Chronic systolic heart failure (HCC)    a. EF 30-35% in 04/2011, improved to 55-60% in 01/2012.  Marland Kitchen CKD (chronic kidney disease), stage II   . COPD (chronic obstructive pulmonary disease) ([place])   . Diabetes mellitus   . GERD (gastroesophageal reflux disease)   . History of pneumonia   . Hx of cardiovascular stress test    a. Nuclear (12/2012):  No ischemia, EF 59%  . Hyperkalemia    a. 12/2012 - spironolactone, KCl supp discontinued.  . Hyperlipidemia   . Hypertension   . Hypertriglyceridemia    a. 12/2012 - started on fenofibrate.  . Knee pain   . NICM (nonischemic cardiomyopathy) ([place])    a. Echo 3/13: mild LVH, EF 30-35%, grade 3 diast dysfxn, mod LAE;  RHC 4/13:  RA 8, RV 58/10, PA 56/18, mean 35, PCWP mean 17, LV 154/27, CO 4.3, CI 2.0. b. EF Improved >>> Echo (01/2012):  Mild LVH, EF 55-60%, no RWMA, Gr 1 DD     Past Surgical History:  Procedure Laterality Date  . None      Family History  Problem Relation Age of Onset  . Diabetes Sister   . Breast cancer Mother   . Breast cancer Maternal Grandmother     Social History   Tobacco Use  . Smoking status: Former Smoker     Packs/day: 1.00    Years: 20.00    Pack years: 20.00    Types: Cigarettes    Quit date: 09/15/2006    Years since quitting: 12.5  . Smokeless tobacco: Never Used  Substance Use Topics  . Alcohol use: Yes    Comment: occ  . Drug use:  No    ROS   Objective:   Vitals: BP (!) 174/94   Pulse 81   Temp 98.7 F (37.1 C)   Resp 16   SpO2 97%   BP Readings from Last 3 Encounters:  03/28/19 (!) 174/94  04/10/18 136/69  10/03/17 (!) 141/81   Physical Exam Constitutional:      General: He is not in acute distress.    Appearance: Normal appearance. He is well-developed. He is obese. He is not ill-appearing, toxic-appearing or diaphoretic.  HENT:     Head: Normocephalic and atraumatic.     Right Ear: External ear normal.     Left Ear: External ear normal.     Nose: Nose normal.     Mouth/Throat:     Mouth: Mucous membranes are moist.     Pharynx: Oropharynx is clear.  Eyes:     General: No scleral icterus.    Extraocular Movements: Extraocular movements intact.     Pupils: Pupils are equal, round, and reactive to light.  Cardiovascular:     Rate and Rhythm: Normal rate and regular rhythm.     Heart sounds: Normal heart sounds. No murmur. No friction rub. No gallop.   Pulmonary:     Effort: Pulmonary effort is normal. No respiratory distress.     Breath sounds: No stridor. Decreased breath sounds (Throughout) present. No wheezing, rhonchi or rales.  Abdominal:     General: Bowel sounds are normal. There is distension (Not new per patient).     Palpations: There is no mass.     Tenderness: There is abdominal tenderness (Mildly tender). There is no guarding or rebound.     Hernia: No hernia is present.  Musculoskeletal:     Right lower leg: No tenderness. Edema (1+ up to mid calf without erythema or tenderness) present.     Left lower leg: No tenderness. Edema (Trace up to distal portion of calf) present.  Skin:    General: Skin is warm and dry.  Neurological:      General: No focal deficit present.     Mental Status: He is alert and oriented to person, place, and time.     Cranial Nerves: No cranial nerve deficit.     Motor: No weakness.  Psychiatric:        Mood and Affect: Mood normal. Mood is not anxious.        Behavior: Behavior normal. Behavior is not agitated.        Thought Content: Thought content normal.    DG Chest 2 View  Result Date: 03/28/2019 CLINICAL DATA:  Shortness of breath. EXAM: CHEST - 2 VIEW COMPARISON:  October 03, 2017. FINDINGS: The heart size and mediastinal contours are within normal limits. No pneumothorax or pleural effusion is noted. Left lung is clear. Minimal opacity is seen in the right upper lobe adjacent to the right minor fissure best seen on lateral radiograph, concerning for mild atelectasis or pneumonia. The visualized skeletal structures are unremarkable. IMPRESSION: Minimal right upper lobe opacity is noted concerning for mild atelectasis or pneumonia. Follow-up radiographs are recommended to ensure resolution. Electronically Signed   By: Marijo Conception M.D.   On: 03/28/2019 09:35    Assessment and Plan :   1. Community acquired pneumonia of right upper lobe of lung   2. Cough   3. Shortness of breath   4. Generalized abdominal pain   5. Body aches   6. Other fatigue   7. Well controlled diabetes  mellitus ([place])   8. Chronic congestive heart failure, unspecified heart failure type ([place])   9. Essential hypertension   10. Stage 3 chronic kidney disease, unspecified whether stage 3a or 3b CKD   11. OSA (obstructive sleep apnea)     Given radiology report of right upper lobe opacity concerning for pneumonia will cover for this with azithromycin and cefdinir.  Recommend supportive care otherwise.  COVID-19 testing pending. Counseled patient on potential for adverse effects with medications prescribed/recommended today, ER and return-to-clinic precautions discussed, patient verbalized understanding.    [name], PA-C 03/28/19 1005

## 2019-03-30 LAB — NOVEL CORONAVIRUS, NAA (HOSP ORDER, SEND-OUT TO REF LAB; TAT 18-24 HRS): SARS-CoV-2, NAA: NOT DETECTED

## 2019-04-27 ENCOUNTER — Other Ambulatory Visit: Payer: Self-pay

## 2019-04-27 ENCOUNTER — Encounter (HOSPITAL_COMMUNITY): Payer: Self-pay

## 2019-04-27 ENCOUNTER — Ambulatory Visit (HOSPITAL_COMMUNITY)
Admission: EM | Admit: 2019-04-27 | Discharge: 2019-04-27 | Disposition: A | Discharge status: Home / Self Care | Payer: BC Managed Care – PPO | Attending: Family Medicine | Admitting: Family Medicine

## 2019-04-27 ENCOUNTER — Ambulatory Visit (INDEPENDENT_AMBULATORY_CARE_PROVIDER_SITE_OTHER): Payer: BC Managed Care – PPO

## 2019-04-27 DIAGNOSIS — Z79899 Other long term (current) drug therapy: Secondary | ICD-10-CM | POA: Diagnosis not present

## 2019-04-27 DIAGNOSIS — R0602 Shortness of breath: Secondary | ICD-10-CM | POA: Diagnosis not present

## 2019-04-27 DIAGNOSIS — Z20822 Contact with and (suspected) exposure to covid-19: Secondary | ICD-10-CM | POA: Diagnosis not present

## 2019-04-27 DIAGNOSIS — E1159 Type 2 diabetes mellitus with other circulatory complications: Secondary | ICD-10-CM | POA: Insufficient documentation

## 2019-04-27 DIAGNOSIS — K219 Gastro-esophageal reflux disease without esophagitis: Secondary | ICD-10-CM | POA: Diagnosis not present

## 2019-04-27 DIAGNOSIS — I429 Cardiomyopathy, unspecified: Secondary | ICD-10-CM | POA: Diagnosis not present

## 2019-04-27 DIAGNOSIS — Z794 Long term (current) use of insulin: Secondary | ICD-10-CM | POA: Diagnosis not present

## 2019-04-27 DIAGNOSIS — I2510 Atherosclerotic heart disease of native coronary artery without angina pectoris: Secondary | ICD-10-CM | POA: Insufficient documentation

## 2019-04-27 DIAGNOSIS — Z6830 Body mass index (BMI) 30.0-30.9, adult: Secondary | ICD-10-CM | POA: Diagnosis not present

## 2019-04-27 DIAGNOSIS — N184 Chronic kidney disease, stage 4 (severe): Secondary | ICD-10-CM | POA: Diagnosis not present

## 2019-04-27 DIAGNOSIS — E785 Hyperlipidemia, unspecified: Secondary | ICD-10-CM | POA: Diagnosis not present

## 2019-04-27 DIAGNOSIS — E781 Pure hyperglyceridemia: Secondary | ICD-10-CM | POA: Diagnosis not present

## 2019-04-27 DIAGNOSIS — R05 Cough: Secondary | ICD-10-CM | POA: Diagnosis not present

## 2019-04-27 DIAGNOSIS — I5022 Chronic systolic (congestive) heart failure: Secondary | ICD-10-CM | POA: Diagnosis not present

## 2019-04-27 DIAGNOSIS — M1611 Unilateral primary osteoarthritis, right hip: Secondary | ICD-10-CM | POA: Diagnosis not present

## 2019-04-27 DIAGNOSIS — Z87891 Personal history of nicotine dependence: Secondary | ICD-10-CM | POA: Diagnosis not present

## 2019-04-27 DIAGNOSIS — Z791 Long term (current) use of non-steroidal anti-inflammatories (NSAID): Secondary | ICD-10-CM | POA: Diagnosis not present

## 2019-04-27 DIAGNOSIS — E1122 Type 2 diabetes mellitus with diabetic chronic kidney disease: Secondary | ICD-10-CM | POA: Insufficient documentation

## 2019-04-27 DIAGNOSIS — Z7982 Long term (current) use of aspirin: Secondary | ICD-10-CM | POA: Diagnosis not present

## 2019-04-27 DIAGNOSIS — I130 Hypertensive heart and chronic kidney disease with heart failure and stage 1 through stage 4 chronic kidney disease, or unspecified chronic kidney disease: Secondary | ICD-10-CM | POA: Diagnosis not present

## 2019-04-27 DIAGNOSIS — Z7901 Long term (current) use of anticoagulants: Secondary | ICD-10-CM | POA: Diagnosis not present

## 2019-04-27 DIAGNOSIS — J440 Chronic obstructive pulmonary disease with acute lower respiratory infection: Secondary | ICD-10-CM | POA: Insufficient documentation

## 2019-04-27 DIAGNOSIS — E669 Obesity, unspecified: Secondary | ICD-10-CM | POA: Diagnosis not present

## 2019-04-27 DIAGNOSIS — J189 Pneumonia, unspecified organism: Secondary | ICD-10-CM

## 2019-04-27 LAB — CBC WITH DIFFERENTIAL/PLATELET
Abs Immature Granulocytes: 0.03 10*3/uL (ref 0.00–0.07)
Basophils Absolute: 0 10*3/uL (ref 0.0–0.1)
Basophils Relative: 0 %
Eosinophils Absolute: 0.1 10*3/uL (ref 0.0–0.5)
Eosinophils Relative: 1 %
HCT: 39.9 % (ref 39.0–52.0)
Hemoglobin: 13.3 g/dL (ref 13.0–17.0)
Immature Granulocytes: 0 %
Lymphocytes Relative: 19 %
Lymphs Abs: 1.8 10*3/uL (ref 0.7–4.0)
MCH: 30.4 pg (ref 26.0–34.0)
MCHC: 33.3 g/dL (ref 30.0–36.0)
MCV: 91.1 fL (ref 80.0–100.0)
Monocytes Absolute: 0.7 10*3/uL (ref 0.1–1.0)
Monocytes Relative: 7 %
Neutro Abs: 6.6 10*3/uL (ref 1.7–7.7)
Neutrophils Relative %: 73 %
Platelets: 324 10*3/uL (ref 150–400)
RBC: 4.38 MIL/uL (ref 4.22–5.81)
RDW: 13 % (ref 11.5–15.5)
WBC: 9.2 10*3/uL (ref 4.0–10.5)
nRBC: 0 % (ref 0.0–0.2)

## 2019-04-27 LAB — COMPREHENSIVE METABOLIC PANEL
ALT: 19 U/L (ref 0–44)
AST: 19 U/L (ref 15–41)
Albumin: 3.2 g/dL — ABNORMAL LOW (ref 3.5–5.0)
Alkaline Phosphatase: 108 U/L (ref 38–126)
Anion gap: 9 (ref 5–15)
BUN: 14 mg/dL (ref 6–20)
CO2: 28 mmol/L (ref 22–32)
Calcium: 8.9 mg/dL (ref 8.9–10.3)
Chloride: 102 mmol/L (ref 98–111)
Creatinine, Ser: 1.61 mg/dL — ABNORMAL HIGH (ref 0.61–1.24)
GFR calc Af Amer: 53 mL/min — ABNORMAL LOW (ref >60)
GFR calc non Af Amer: 46 mL/min — ABNORMAL LOW (ref >60)
Glucose, Bld: 134 mg/dL — ABNORMAL HIGH (ref 70–99)
Potassium: 3.3 mmol/L — ABNORMAL LOW (ref 3.5–5.1)
Sodium: 139 mmol/L (ref 135–145)
Total Bilirubin: 0.8 mg/dL (ref 0.3–1.2)
Total Protein: 6.4 g/dL — ABNORMAL LOW (ref 6.5–8.1)

## 2019-04-27 LAB — BRAIN NATRIURETIC PEPTIDE: B Natriuretic Peptide: 373.3 pg/mL — ABNORMAL HIGH (ref 0.0–100.0)

## 2019-04-27 MED ORDER — LEVOFLOXACIN 750 MG PO TABS: 750.0000 mg | ORAL_TABLET | ORAL | 0 refills | Status: DC

## 2019-04-27 MED ORDER — GUAIFENESIN 100 MG/5ML PO LIQD: 100.0000 mg | ORAL | 0 refills | Status: DC | PRN

## 2019-04-27 NOTE — ED Provider Notes (Signed)
[place]    CSN: [id number] Arrival date & time: 04/27/19  8032      History   Chief Complaint Chief Complaint  Patient presents with  . Shortness of Breath    HPI Baltasar Twilley is a 59 y.o. male.   Patient is a 59 year old male past medical history of CAD, heart failure, CKD, COPD, diabetes, GERD, and pneumonia, hyperkalemia, hyperlipidemia, hypertension, hypertriglyceridemia, cardiomyopathy, CKD 3.  He presents today with approximately 4 days of worsening shortness of breath.  Shortness of breath with walking short distances.  He has had cough but no sputum.  Denies any chest pain or associated fever, chills or body aches.  He has had some intermittent lower extremity edema.  Was seen here on 03/28/2019 and treated for pneumonia.  He finished this treatment and was feeling better after.   ROS per HPI      Past Medical History:  Diagnosis Date  . CAD (coronary artery disease)    a. LHC 4/13: mD1 25%, oD2 25%, pOM1 25%, pRCA 25%, dRCA 40%, EF 30%  . Chronic systolic heart failure (HCC)    a. EF 30-35% in 04/2011, improved to 55-60% in 01/2012.  Marland Kitchen CKD (chronic kidney disease), stage II   . COPD (chronic obstructive pulmonary disease) ([place])   . Diabetes mellitus   . GERD (gastroesophageal reflux disease)   . History of pneumonia   . Hx of cardiovascular stress test    a. Nuclear (12/2012):  No ischemia, EF 59%  . Hyperkalemia    a. 12/2012 - spironolactone, KCl supp discontinued.  . Hyperlipidemia   . Hypertension   . Hypertriglyceridemia    a. 12/2012 - started on fenofibrate.  . Knee pain   . NICM (nonischemic cardiomyopathy) ([place])    a. Echo 3/13: mild LVH, EF 30-35%, grade 3 diast dysfxn, mod LAE;  RHC 4/13:  RA 8, RV 58/10, PA 56/18, mean 35, PCWP mean 17, LV 154/27, CO 4.3, CI 2.0. b. EF Improved >>> Echo (01/2012):  Mild LVH, EF 55-60%, no RWMA, Gr 1 DD    Patient Active Problem List   Diagnosis Date Noted  . 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 Hypertension secondary to other renal disorders 11/29/2016  . GERD (gastroesophageal reflux disease) 07/23/2016  . Degenerative disc disease at L5-S1 level 04/09/2016  . Primary osteoarthritis of right hip 04/09/2016  . Obesity, Class I, BMI 30-34.9 02/06/2016  . Ulcer of left foot ([place]) 01/06/2015  . Microalbuminuria due to type 2 diabetes mellitus ([place]) 10/03/2014  . 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 diabetes mellitus ([place]) 06/26/2014  . NICM (nonischemic cardiomyopathy) ([place]) 09/06/2013  . ED (erectile dysfunction) 03/27/2013  . Acute on chronic renal insufficiency 12/17/2012  . Hypertriglyceridemia 12/17/2012  . Disorder of kidney and ureter 12/17/2012  . Precordial chest pain 12/16/2012  . Hyperkalemia 12/16/2012  . Diabetes mellitus ([place]) 12/16/2012  . Coronary Artery Disease (non-obstructive by cath 05/2011) 06/14/2011  . Hyperlipidemia 06/14/2011  . OSA (obstructive sleep apnea) 05/25/2011  . Chronic systolic heart failure ([place]) 05/11/2011  . Heart failure ([place]) 04/27/2011  . Hypertension   . SOB (shortness of breath)     Past Surgical History:  Procedure Laterality Date  . None         Home Medications    Prior to Admission medications   Medication Sig Start Date End Date Taking? Authorizing Provider  ACCU-CHEK FASTCLIX LANCETS [place]  USE TO MONITOR BLOOD GLUCOSE 3 TIME(S) DAILY 05/05/17   [provider]  amLODipine (NORVASC) 5 MG tablet TAKE 1 TABLET BY MOUTH DAILY 12/06/17   [provider]  aspirin 81 MG tablet Take 81 mg by mouth daily.    [provider]  atorvastatin (LIPITOR) 10 MG tablet Take by mouth. 03/31/18   [provider]  azithromycin (ZITHROMAX) 250 MG tablet Start with 2 tablets today, then 1 daily thereafter. 03/28/19   [name], PA-C  BD PEN NEEDLE NANO U/F 32G X 4 MM MISC 1 EACH BY DOES NOT APPLY ROUTE 2 (TWO) TIMES DAILY. 05/18/16   [provider]  benzonatate (TESSALON) 100 MG capsule Take 1-2 capsules (100-200 mg total) by mouth 3 (three) times daily as needed. 03/28/19   [name], PA-C  Blood Glucose Monitoring Suppl (ACCU-CHEK GUIDE) w/Device KIT 3 TIMES A DAY. USE AS DIRECTED BY PHYSICIAN TO MONITOR BLOOD SUGARS 04/05/17   [provider]  cefdinir (OMNICEF) 300 MG capsule Take 1 capsule (300 mg total) by mouth 2 (two) times daily. 03/28/19   [name], PA-C  ciprofloxacin (CIPRO) 500 MG tablet Take 1 tablet (500 mg total) by mouth 2 (two) times daily. 06/02/18   [name], DPM  clindamycin (CLEOCIN) 300 MG capsule Take 1 capsule (300 mg total) by mouth 3 (three) times daily. 06/02/18   [name], DPM  doxycycline (VIBRA-TABS) 100 MG tablet Take 1 tablet (100 mg total) by mouth 2 (two) times daily. 06/14/18   [name], DPM  fenofibrate (TRICOR) 48 MG tablet Take 1 tablet (48 mg total) by mouth daily. 12/17/12   Dunn, Nedra Hai, PA-C  furosemide (LASIX) 40 MG tablet Take 1 tablet (40 mg total) by mouth daily. 09/06/13   [name], PA-C  glucose blood (ONETOUCH VERIO) test strip 4-5 TIMES PER DAY 05/17/16   [provider]  guaiFENesin (ROBITUSSIN) 100 MG/5ML liquid Take 5-10 mLs (100-200 mg total) by mouth every 4 (four) hours as needed for cough. 04/27/19   [name], NP  hydrALAZINE (APRESOLINE) 25 MG tablet Take 25 mg by mouth 3 (three) times daily.  06/22/16   [provider]  HYDROcodone-acetaminophen (NORCO/VICODIN) 5-325 MG tablet Take 1 tablet by mouth every 6 (six) hours as needed. 06/14/18   [name], DPM  Insulin Degludec 200 UNIT/ML SOPN Inject into the skin. 12/02/17   [provider]  isosorbide mononitrate (IMDUR) 120 MG 24 hr tablet TAKE 1 TABLET (120 MG TOTAL) BY MOUTH DAILY. Patient taking differently: Take 120 mg by mouth daily.  02/21/13   [name], MD  LANTUS 100 UNIT/ML injection Inject 40 Units into the skin at bedtime.   06/22/13   [provider]  levofloxacin (LEVAQUIN) 750 MG tablet Take 1 tablet (750 mg total) by mouth every other day. 04/27/19   [name], NP  lisinopril (PRINIVIL,ZESTRIL) 40 MG tablet Take 40 mg by mouth daily.  06/22/16   [provider]  meloxicam (MOBIC) 15 MG tablet TAKE ONE TABLET (15 MG DOSE) BY MOUTH DAILY. 12/19/17   [provider]  metoprolol succinate (TOPROL-XL) 100 MG 24 hr tablet Take 100 mg by mouth daily.  06/22/16   [provider]  METOPROLOL SUCCINATE ER PO  12/22/17   [provider]  mupirocin ointment (BACTROBAN) 2 % Apply 1 application topically 2 (two) times daily. 03/31/18   [name], DPM  omeprazole (PRILOSEC) 20 MG capsule Take  20 mg by mouth daily.  05/09/16   [provider]  OZEMPIC 1 MG/DOSE SOPN Inject 1 mg into the skin every Saturday.  09/25/16   [provider]  pravastatin (PRAVACHOL) 20 MG tablet Take 20 mg by mouth at bedtime.  06/22/16   [provider]  promethazine (PHENERGAN) 25 MG tablet Take 1 tablet (25 mg total) by mouth every 8 (eight) hours as needed for nausea or vomiting. 06/14/18   [name], DPM  promethazine-dextromethorphan (PROMETHAZINE-DM) 6.25-15 MG/5ML syrup Take 5 mLs by mouth at bedtime as needed for cough. 03/28/19   [name], PA-C  silver sulfADIAZINE (SILVADENE) 1 % cream Apply 1 application topically daily. 07/16/16   [name], DPM  silver sulfADIAZINE (SILVADENE) 1 % cream Apply 1 application topically daily. 01/16/18   [name], DPM  tadalafil (CIALIS) 20 MG tablet  01/02/19   [provider]  tiZANidine (ZANAFLEX) 4 MG capsule Take 4 mg by mouth every 6 (six) hours as needed. 11/29/18   [provider]  torsemide (DEMADEX) 20 MG tablet Take 20 mg by mouth daily. 12/24/18   [provider]  traZODone (DESYREL) 50 MG tablet Take 0.5-1 tablets (25-50 mg total) by mouth at bedtime as needed for sleep. 08/20/17    Wendling, Crosby Oyster, DO  TRESIBA FLEXTOUCH 200 UNIT/ML SOPN INJECT 0.28 MLS (56 UNITS DOSE) INTO THE SKIN DAILY. 11/08/17   [provider]  potassium chloride (KLOR-CON) 20 MEQ packet Take 20 mEq by mouth 2 (two) times daily.  04/23/11  [provider]    Family History Family History  Problem Relation Age of Onset  . Diabetes Sister   . Breast cancer Mother   . Breast cancer Maternal Grandmother     Social History Social History   Tobacco Use  . Smoking status: Former Smoker    Packs/day: 1.00    Years: 20.00    Pack years: 20.00    Types: Cigarettes    Quit date: 09/15/2006    Years since quitting: 12.6  . Smokeless tobacco: Never Used  Substance Use Topics  . Alcohol use: Yes    Comment: occ  . Drug use: No     Allergies   Metformin and related   Review of Systems Review of Systems   Physical Exam Triage Vital Signs ED Triage Vitals  Enc Vitals Group     BP 04/27/19 0829 (!) 183/94     Pulse Rate 04/27/19 0829 74     Resp 04/27/19 0829 16     Temp 04/27/19 0829 98 F (36.7 C)     Temp Source 04/27/19 0829 Oral     SpO2 04/27/19 0829 98 %     Weight --      Height --      Head Circumference --      Peak Flow --      Pain Score 04/27/19 0828 0     Pain Loc --      Pain Edu? --      Excl. in [place]? --    No data found.  Updated Vital Signs BP (!) 183/94 (BP Location: Right Arm)   Pulse 74   Temp 98 F (36.7 C) (Oral)   Resp 16   SpO2 98%   Visual Acuity Right Eye Distance:   Left Eye Distance:   Bilateral Distance:    Right Eye Near:   Left Eye Near:    Bilateral Near:  Physical Exam Vitals and nursing note reviewed.  Constitutional:      General: He is not in acute distress.    Appearance: Normal appearance. He is well-developed. He is not ill-appearing, toxic-appearing or diaphoretic.  HENT:     Head: Normocephalic and atraumatic.     Nose: Nose normal.  Eyes:     Conjunctiva/sclera: Conjunctivae normal.   Cardiovascular:     Rate and Rhythm: Normal rate and regular rhythm.  Pulmonary:     Effort: Pulmonary effort is normal.     Breath sounds: Examination of the right-lower field reveals rales. Examination of the left-lower field reveals rales. Decreased breath sounds and rales present.  Abdominal:     Palpations: Abdomen is soft.     Tenderness: There is no abdominal tenderness.  Musculoskeletal:        General: Normal range of motion.     Cervical back: Normal range of motion.  Skin:    General: Skin is warm and dry.  Neurological:     Mental Status: He is alert.  Psychiatric:        Mood and Affect: Mood normal.      UC Treatments / Results  Labs (all labs ordered are listed, but only abnormal results are displayed) Labs Reviewed  SARS CORONAVIRUS 2 (TAT 6-24 HRS)  CBC WITH DIFFERENTIAL/PLATELET  COMPREHENSIVE METABOLIC PANEL  BRAIN NATRIURETIC PEPTIDE    EKG   Radiology DG Chest 2 View  Result Date: 04/27/2019 CLINICAL DATA:  Shortness of breath and cough EXAM: CHEST - 2 VIEW COMPARISON:  March 28, 2019 FINDINGS: There is ill-defined airspace opacity in portions of each lung base, slightly more on the right than on the left. Lungs elsewhere clear. Heart size and pulmonary vascularity are normal. No adenopathy. There is degenerative change in the thoracic spine. IMPRESSION: Bibasilar ill-defined opacity, slightly more on the right than the left. Question bibasilar pneumonia. No consolidation. Appearance raises question of atypical organism type pneumonia. Heart size within normal limits.  No evident adenopathy. These results will be called to the ordering clinician or representative by the Radiologist Assistant, and communication documented in the PACS or [employer]. Electronically Signed   By: Lowella Grip III M.D.   On: 04/27/2019 09:14    Procedures Procedures (including critical care time)  Medications Ordered in UC Medications - No data to  display  Initial Impression / Assessment and Plan / UC Course  I have reviewed the triage vital signs and the nursing notes.  Pertinent labs & imaging results that were available during my care of the patient were reviewed by me and considered in my medical decision making (see chart for details).     Bilateral pneumonia-patient is currently in no distress.  Oxygen saturations are 98%. Does not appear to be hypoxic. Repeat Covid test done Treating with Levaquin 750 mg every other day for 5 doses.  This is based on renal insufficiency. Guaifenesin for cough Recommended if symptoms continue for worsening to include worsening shortness of breath he will need to go to the ER. Patient understanding and agree. Work note given Final Clinical Impressions(s) / UC Diagnoses   Final diagnoses:  Pneumonia of both lower lobes due to infectious organism     Discharge Instructions     Take 1 pill of the antibiotic every other day until 5 doses are finished.  It is dosed this way due to your renal impairment.  Be sure that you take this as prescribed. Cough medicine as needed  If your symptoms worsen you need to go to the ER.     ED Prescriptions    Medication Sig Dispense Auth. Provider   guaiFENesin (ROBITUSSIN) 100 MG/5ML liquid Take 5-10 mLs (100-200 mg total) by mouth every 4 (four) hours as needed for cough. 60 mL Haruye Lainez A, NP   levofloxacin (LEVAQUIN) 750 MG tablet Take 1 tablet (750 mg total) by mouth every other day. 5 tablet [name], NP     PDMP not reviewed this encounter.   Orvan July, NP 04/27/19 1032

## 2019-04-27 NOTE — ED Triage Notes (Signed)
Pt presents with shortness of breath X 4 days; pt states about a month ago he had pneumonia in right lung.

## 2019-04-27 NOTE — Discharge Instructions (Addendum)
Take 1 pill of the antibiotic every other day until 5 doses are finished.  It is dosed this way due to your renal impairment.  Be sure that you take this as prescribed. Cough medicine as needed If your symptoms worsen you need to go to the ER.

## 2019-04-28 ENCOUNTER — Emergency Department (HOSPITAL_COMMUNITY): Payer: BC Managed Care – PPO

## 2019-04-28 ENCOUNTER — Observation Stay (HOSPITAL_BASED_OUTPATIENT_CLINIC_OR_DEPARTMENT_OTHER): Payer: BC Managed Care – PPO

## 2019-04-28 ENCOUNTER — Other Ambulatory Visit: Payer: Self-pay

## 2019-04-28 ENCOUNTER — Inpatient Hospital Stay (HOSPITAL_COMMUNITY)
Admission: EM | Admit: 2019-04-28 | Discharge: 2019-05-01 | DRG: 291 | Disposition: A | Discharge status: Home / Self Care | Payer: BC Managed Care – PPO | Attending: Internal Medicine | Admitting: Internal Medicine

## 2019-04-28 ENCOUNTER — Encounter (HOSPITAL_COMMUNITY): Payer: Self-pay

## 2019-04-28 DIAGNOSIS — Z87891 Personal history of nicotine dependence: Secondary | ICD-10-CM

## 2019-04-28 DIAGNOSIS — Z833 Family history of diabetes mellitus: Secondary | ICD-10-CM

## 2019-04-28 DIAGNOSIS — Z20822 Contact with and (suspected) exposure to covid-19: Secondary | ICD-10-CM | POA: Diagnosis present

## 2019-04-28 DIAGNOSIS — G4733 Obstructive sleep apnea (adult) (pediatric): Secondary | ICD-10-CM | POA: Diagnosis present

## 2019-04-28 DIAGNOSIS — N182 Chronic kidney disease, stage 2 (mild): Secondary | ICD-10-CM

## 2019-04-28 DIAGNOSIS — I161 Hypertensive emergency: Secondary | ICD-10-CM | POA: Diagnosis present

## 2019-04-28 DIAGNOSIS — E119 Type 2 diabetes mellitus without complications: Secondary | ICD-10-CM

## 2019-04-28 DIAGNOSIS — E876 Hypokalemia: Secondary | ICD-10-CM | POA: Diagnosis present

## 2019-04-28 DIAGNOSIS — N1832 Chronic kidney disease, stage 3b: Secondary | ICD-10-CM

## 2019-04-28 DIAGNOSIS — I130 Hypertensive heart and chronic kidney disease with heart failure and stage 1 through stage 4 chronic kidney disease, or unspecified chronic kidney disease: Secondary | ICD-10-CM | POA: Diagnosis not present

## 2019-04-28 DIAGNOSIS — E1122 Type 2 diabetes mellitus with diabetic chronic kidney disease: Secondary | ICD-10-CM | POA: Diagnosis present

## 2019-04-28 DIAGNOSIS — Z8701 Personal history of pneumonia (recurrent): Secondary | ICD-10-CM

## 2019-04-28 DIAGNOSIS — I10 Essential (primary) hypertension: Secondary | ICD-10-CM | POA: Diagnosis present

## 2019-04-28 DIAGNOSIS — I509 Heart failure, unspecified: Secondary | ICD-10-CM

## 2019-04-28 DIAGNOSIS — E66811 Obesity, class 1: Secondary | ICD-10-CM | POA: Diagnosis present

## 2019-04-28 DIAGNOSIS — Z888 Allergy status to other drugs, medicaments and biological substances status: Secondary | ICD-10-CM

## 2019-04-28 DIAGNOSIS — K219 Gastro-esophageal reflux disease without esophagitis: Secondary | ICD-10-CM | POA: Diagnosis present

## 2019-04-28 DIAGNOSIS — R778 Other specified abnormalities of plasma proteins: Secondary | ICD-10-CM

## 2019-04-28 DIAGNOSIS — E781 Pure hyperglyceridemia: Secondary | ICD-10-CM | POA: Diagnosis present

## 2019-04-28 DIAGNOSIS — R140 Abdominal distension (gaseous): Secondary | ICD-10-CM | POA: Diagnosis not present

## 2019-04-28 DIAGNOSIS — N049 Nephrotic syndrome with unspecified morphologic changes: Secondary | ICD-10-CM | POA: Diagnosis present

## 2019-04-28 DIAGNOSIS — I5031 Acute diastolic (congestive) heart failure: Secondary | ICD-10-CM

## 2019-04-28 DIAGNOSIS — N1831 Chronic kidney disease, stage 3a: Secondary | ICD-10-CM | POA: Diagnosis present

## 2019-04-28 DIAGNOSIS — I428 Other cardiomyopathies: Secondary | ICD-10-CM | POA: Diagnosis present

## 2019-04-28 DIAGNOSIS — E782 Mixed hyperlipidemia: Secondary | ICD-10-CM | POA: Diagnosis present

## 2019-04-28 DIAGNOSIS — I2510 Atherosclerotic heart disease of native coronary artery without angina pectoris: Secondary | ICD-10-CM | POA: Diagnosis present

## 2019-04-28 DIAGNOSIS — Z79899 Other long term (current) drug therapy: Secondary | ICD-10-CM

## 2019-04-28 DIAGNOSIS — E669 Obesity, unspecified: Secondary | ICD-10-CM | POA: Diagnosis present

## 2019-04-28 DIAGNOSIS — Z6831 Body mass index (BMI) 31.0-31.9, adult: Secondary | ICD-10-CM

## 2019-04-28 DIAGNOSIS — I5033 Acute on chronic diastolic (congestive) heart failure: Secondary | ICD-10-CM | POA: Diagnosis present

## 2019-04-28 DIAGNOSIS — Z803 Family history of malignant neoplasm of breast: Secondary | ICD-10-CM

## 2019-04-28 DIAGNOSIS — Z794 Long term (current) use of insulin: Secondary | ICD-10-CM

## 2019-04-28 DIAGNOSIS — E1129 Type 2 diabetes mellitus with other diabetic kidney complication: Secondary | ICD-10-CM

## 2019-04-28 DIAGNOSIS — Z7982 Long term (current) use of aspirin: Secondary | ICD-10-CM

## 2019-04-28 LAB — SARS CORONAVIRUS 2 (TAT 6-24 HRS)
SARS Coronavirus 2: NEGATIVE
SARS Coronavirus 2: NEGATIVE

## 2019-04-28 LAB — CBC
HCT: 39.4 % (ref 39.0–52.0)
Hemoglobin: 13.1 g/dL (ref 13.0–17.0)
MCH: 30.8 pg (ref 26.0–34.0)
MCHC: 33.2 g/dL (ref 30.0–36.0)
MCV: 92.5 fL (ref 80.0–100.0)
Platelets: 331 10*3/uL (ref 150–400)
RBC: 4.26 MIL/uL (ref 4.22–5.81)
RDW: 13.1 % (ref 11.5–15.5)
WBC: 8.4 10*3/uL (ref 4.0–10.5)
nRBC: 0 % (ref 0.0–0.2)

## 2019-04-28 LAB — HIV ANTIBODY (ROUTINE TESTING W REFLEX): HIV Screen 4th Generation wRfx: NONREACTIVE

## 2019-04-28 LAB — BASIC METABOLIC PANEL
Anion gap: 12 (ref 5–15)
BUN: 14 mg/dL (ref 6–20)
CO2: 23 mmol/L (ref 22–32)
Calcium: 8.9 mg/dL (ref 8.9–10.3)
Chloride: 104 mmol/L (ref 98–111)
Creatinine, Ser: 1.7 mg/dL — ABNORMAL HIGH (ref 0.61–1.24)
GFR calc Af Amer: 50 mL/min — ABNORMAL LOW (ref >60)
GFR calc non Af Amer: 43 mL/min — ABNORMAL LOW (ref >60)
Glucose, Bld: 227 mg/dL — ABNORMAL HIGH (ref 70–99)
Potassium: 3.7 mmol/L (ref 3.5–5.1)
Sodium: 139 mmol/L (ref 135–145)

## 2019-04-28 LAB — GLUCOSE, CAPILLARY
Glucose-Capillary: 111 mg/dL — ABNORMAL HIGH (ref 70–99)
Glucose-Capillary: 203 mg/dL — ABNORMAL HIGH (ref 70–99)

## 2019-04-28 LAB — ECHOCARDIOGRAM COMPLETE
Height: 71 in
Weight: 3664.93 oz

## 2019-04-28 LAB — BRAIN NATRIURETIC PEPTIDE: B Natriuretic Peptide: 469.9 pg/mL — ABNORMAL HIGH (ref 0.0–100.0)

## 2019-04-28 LAB — TSH: TSH: 2.65 u[IU]/mL (ref 0.350–4.500)

## 2019-04-28 LAB — TROPONIN I (HIGH SENSITIVITY)
Troponin I (High Sensitivity): 38 ng/L — ABNORMAL HIGH (ref <18)
Troponin I (High Sensitivity): 43 ng/L — ABNORMAL HIGH (ref <18)

## 2019-04-28 LAB — D-DIMER, QUANTITATIVE: D-Dimer, Quant: 1.4 ug/mL-FEU — ABNORMAL HIGH (ref 0.00–0.50)

## 2019-04-28 MED ORDER — SODIUM CHLORIDE 0.9% FLUSH
3.0000 mL | Freq: Once | INTRAVENOUS | Status: AC
  Administered 2019-04-28: 3 mL via INTRAVENOUS

## 2019-04-28 MED ORDER — INSULIN ASPART 100 UNIT/ML ~~LOC~~ SOLN
0.0000 [IU] | Freq: Three times a day (TID) | SUBCUTANEOUS | Status: DC
  Administered 2019-04-29 – 2019-04-30 (×3): 2 [IU] via SUBCUTANEOUS
  Administered 2019-04-30 – 2019-05-01 (×2): 3 [IU] via SUBCUTANEOUS

## 2019-04-28 MED ORDER — FENOFIBRATE 54 MG PO TABS: 54.0000 mg | ORAL_TABLET | Freq: Every day | ORAL | Status: DC

## 2019-04-28 MED ORDER — PANTOPRAZOLE SODIUM 40 MG PO TBEC
40.0000 mg | DELAYED_RELEASE_TABLET | Freq: Every day | ORAL | Status: DC
  Administered 2019-04-28 – 2019-05-01 (×4): 40 mg via ORAL
  Filled 2019-04-28 (×4): qty 1

## 2019-04-28 MED ORDER — ACETAMINOPHEN 325 MG PO TABS
650.0000 mg | ORAL_TABLET | ORAL | Status: DC | PRN
  Administered 2019-04-29 – 2019-05-01 (×3): 650 mg via ORAL
  Filled 2019-04-28 (×3): qty 2

## 2019-04-28 MED ORDER — FUROSEMIDE 10 MG/ML IJ SOLN
40.0000 mg | Freq: Two times a day (BID) | INTRAMUSCULAR | Status: DC
  Administered 2019-04-28 – 2019-04-30 (×4): 40 mg via INTRAVENOUS
  Filled 2019-04-28 (×4): qty 4

## 2019-04-28 MED ORDER — ASPIRIN EC 81 MG PO TBEC
81.0000 mg | DELAYED_RELEASE_TABLET | Freq: Every day | ORAL | Status: DC
  Administered 2019-04-28 – 2019-05-01 (×4): 81 mg via ORAL
  Filled 2019-04-28 (×4): qty 1

## 2019-04-28 MED ORDER — SODIUM CHLORIDE 0.9 % IV SOLN: 250.0000 mL | INTRAVENOUS | Status: DC | PRN

## 2019-04-28 MED ORDER — AMLODIPINE BESYLATE 5 MG PO TABS: 5.0000 mg | ORAL_TABLET | Freq: Every day | ORAL | Status: DC

## 2019-04-28 MED ORDER — LISINOPRIL 40 MG PO TABS
40.0000 mg | ORAL_TABLET | Freq: Every day | ORAL | Status: DC
  Administered 2019-04-28 – 2019-04-30 (×3): 40 mg via ORAL
  Filled 2019-04-28: qty 2
  Filled 2019-04-28 (×2): qty 1

## 2019-04-28 MED ORDER — ATORVASTATIN CALCIUM 10 MG PO TABS
10.0000 mg | ORAL_TABLET | Freq: Every day | ORAL | Status: DC
  Administered 2019-04-28 – 2019-05-01 (×4): 10 mg via ORAL
  Filled 2019-04-28 (×5): qty 1

## 2019-04-28 MED ORDER — IOHEXOL 350 MG/ML SOLN
100.0000 mL | Freq: Once | INTRAVENOUS | Status: AC | PRN
  Administered 2019-04-28: 81 mL via INTRAVENOUS

## 2019-04-28 MED ORDER — INSULIN GLARGINE 100 UNIT/ML ~~LOC~~ SOLN
40.0000 [IU] | Freq: Every day | SUBCUTANEOUS | Status: DC
  Administered 2019-04-28 – 2019-04-29 (×2): 40 [IU] via SUBCUTANEOUS
  Filled 2019-04-28 (×4): qty 0.4

## 2019-04-28 MED ORDER — HYDRALAZINE HCL 50 MG PO TABS
50.0000 mg | ORAL_TABLET | Freq: Three times a day (TID) | ORAL | Status: DC
  Administered 2019-04-28 – 2019-04-29 (×4): 50 mg via ORAL
  Filled 2019-04-28: qty 1
  Filled 2019-04-28 (×2): qty 2
  Filled 2019-04-28 (×2): qty 1

## 2019-04-28 MED ORDER — SODIUM CHLORIDE 0.9% FLUSH
3.0000 mL | Freq: Two times a day (BID) | INTRAVENOUS | Status: DC
  Administered 2019-04-28 – 2019-05-01 (×7): 3 mL via INTRAVENOUS

## 2019-04-28 MED ORDER — METOPROLOL SUCCINATE ER 100 MG PO TB24
100.0000 mg | ORAL_TABLET | Freq: Every day | ORAL | Status: DC
  Administered 2019-04-28 – 2019-05-01 (×4): 100 mg via ORAL
  Filled 2019-04-28 (×4): qty 1

## 2019-04-28 MED ORDER — HEPARIN SODIUM (PORCINE) 5000 UNIT/ML IJ SOLN
5000.0000 [IU] | Freq: Three times a day (TID) | INTRAMUSCULAR | Status: DC
  Administered 2019-04-28 – 2019-05-01 (×9): 5000 [IU] via SUBCUTANEOUS
  Filled 2019-04-28 (×9): qty 1

## 2019-04-28 MED ORDER — SODIUM CHLORIDE 0.9% FLUSH: 3.0000 mL | INTRAVENOUS | Status: DC | PRN

## 2019-04-28 MED ORDER — AMLODIPINE BESYLATE 10 MG PO TABS
10.0000 mg | ORAL_TABLET | Freq: Every day | ORAL | Status: DC
  Administered 2019-04-28 – 2019-04-29 (×2): 10 mg via ORAL
  Filled 2019-04-28: qty 1
  Filled 2019-04-28: qty 2

## 2019-04-28 MED ORDER — TRAZODONE HCL 50 MG PO TABS: 50.0000 mg | ORAL_TABLET | Freq: Every evening | ORAL | Status: DC | PRN

## 2019-04-28 MED ORDER — FUROSEMIDE 10 MG/ML IJ SOLN
40.0000 mg | Freq: Once | INTRAMUSCULAR | Status: AC
  Administered 2019-04-28: 40 mg via INTRAVENOUS
  Filled 2019-04-28: qty 4

## 2019-04-28 MED ORDER — ISOSORBIDE MONONITRATE ER 60 MG PO TB24
120.0000 mg | ORAL_TABLET | Freq: Every day | ORAL | Status: DC
  Administered 2019-04-28 – 2019-05-01 (×4): 120 mg via ORAL
  Filled 2019-04-28 (×2): qty 2
  Filled 2019-04-28: qty 4
  Filled 2019-04-28: qty 2

## 2019-04-28 MED ORDER — ONDANSETRON HCL 4 MG/2ML IJ SOLN: 4.0000 mg | Freq: Four times a day (QID) | INTRAMUSCULAR | Status: DC | PRN

## 2019-04-28 NOTE — ED Notes (Signed)
Pharmacy contacted for medication verification for pt's due meds.

## 2019-04-28 NOTE — Progress Notes (Signed)
  Echocardiogram 2D Echocardiogram has been performed.  Roberto Allen 04/28/2019, 5:36 PM

## 2019-04-28 NOTE — Procedures (Signed)
Patient states that he does not wear CPAP at home.  Declined CPAP at this time.

## 2019-04-28 NOTE — H&P (Addendum)
History and Physical    Roberto Allen MRN:[id number] DOB: 11/20/1960 DOA: 04/28/2019  PCP: Arvind, Moogali M, MD   Patient coming from: Home  I have personally briefly reviewed patient's old medical records in [facility]  Chief Complaint: SOB  HPI: Roberto Allen is a 58 y.o. male with medical history significant of CAD, CHF, CKD stage II, IDDM, COPD, GERD, hyperlipidemia, hypertension, hypertriglyceridemia, cardiomyopathy, presented with increasing short of breath for 1 month. He was treated for pneumonia at the beginning, feeling better a bit then deteriorated again. Continued to have dry cough and SOB on exertion, and he came to ED yesterday with and COVID PCR was negative.  Denies any chest pain or associated fever, chills or body aches.  He gained about 15 LBS since last month and significant increase of leg swelling. He lost follow up with cardiology about 5 years ago and all his CHF and BP meds were managed by his PCP, and he was briefly switched from Lasix to Torsemide last year and switched back to Lasix daily again since. No changes of his BP meds for about 2-3 years. No fever, chill, no abd pain, no dysuria or diarrhea. ED Course: CTA negative for PE, BP significantly increased.  Review of Systems: As per HPI otherwise 10 point review of systems negative.    Past Medical History:  Diagnosis Date  . CAD (coronary artery disease)    a. LHC 4/13: mD1 25%, oD2 25%, pOM1 25%, pRCA 25%, dRCA 40%, EF 30%  . Chronic systolic heart failure (HCC)    a. EF 30-35% in 04/2011, improved to 55-60% in 01/2012.  . CKD (chronic kidney disease), stage II   . COPD (chronic obstructive pulmonary disease) (HCC)   . Diabetes mellitus   . GERD (gastroesophageal reflux disease)   . History of pneumonia   . Hx of cardiovascular stress test    a. Nuclear (12/2012):  No ischemia, EF 59%  . Hyperkalemia    a. 12/2012 - spironolactone, KCl supp discontinued.  . Hyperlipidemia   . Hypertension    . Hypertriglyceridemia    a. 12/2012 - started on fenofibrate.  . Knee pain   . NICM (nonischemic cardiomyopathy) (HCC)    a. Echo 3/13: mild LVH, EF 30-35%, grade 3 diast dysfxn, mod LAE;  RHC 4/13:  RA 8, RV 58/10, PA 56/18, mean 35, PCWP mean 17, LV 154/27, CO 4.3, CI 2.0. b. EF Improved >>> Echo (01/2012):  Mild LVH, EF 55-60%, no RWMA, Gr 1 DD    Past Surgical History:  Procedure Laterality Date  . None       reports that he quit smoking about 12 years ago. His smoking use included cigarettes. He has a 20.00 pack-year smoking history. He has never used smokeless tobacco. He reports current alcohol use. He reports that he does not use drugs.  Allergies  Allergen Reactions  . Metformin And Related Other (See Comments)    Due to CHF and CRI    Family History  Problem Relation Age of Onset  . Diabetes Sister   . Breast cancer Mother   . Breast cancer Maternal Grandmother      Prior to Admission medications   Medication Sig Start Date End Date Taking? Authorizing Provider  ACCU-CHEK FASTCLIX LANCETS MISC USE TO MONITOR BLOOD GLUCOSE 3 TIME(S) DAILY 05/05/17   [provider]  amLODipine (NORVASC) 5 MG tablet TAKE 1 TABLET BY MOUTH DAILY 12/06/17   [provider]  aspirin 81 MG tablet   Take 81 mg by mouth daily.    [provider]  atorvastatin (LIPITOR) 10 MG tablet Take by mouth. 03/31/18   [provider]  azithromycin (ZITHROMAX) 250 MG tablet Start with 2 tablets today, then 1 daily thereafter. 03/28/19   [name], PA-C  BD PEN NEEDLE NANO U/F 32G X 4 MM MISC 1 EACH BY DOES NOT APPLY ROUTE 2 (TWO) TIMES DAILY. 05/18/16   [provider]  benzonatate (TESSALON) 100 MG capsule Take 1-2 capsules (100-200 mg total) by mouth 3 (three) times daily as needed. 03/28/19   [name], PA-C  Blood Glucose Monitoring Suppl (ACCU-CHEK GUIDE) w/Device KIT 3 TIMES A DAY. USE AS DIRECTED BY PHYSICIAN TO MONITOR BLOOD SUGARS 04/05/17   [provider]  cefdinir (OMNICEF) 300 MG capsule Take 1 capsule (300 mg total) by mouth 2 (two) times daily. 03/28/19   [name], PA-C  ciprofloxacin (CIPRO) 500 MG tablet Take 1 tablet (500 mg total) by mouth 2 (two) times daily. 06/02/18   [name], DPM  clindamycin (CLEOCIN) 300 MG capsule Take 1 capsule (300 mg total) by mouth 3 (three) times daily. 06/02/18   [name], DPM  doxycycline (VIBRA-TABS) 100 MG tablet Take 1 tablet (100 mg total) by mouth 2 (two) times daily. 06/14/18   [name], DPM  fenofibrate (TRICOR) 48 MG tablet Take 1 tablet (48 mg total) by mouth daily. 12/17/12   Dunn, Nedra Hai, PA-C  furosemide (LASIX) 40 MG tablet Take 1 tablet (40 mg total) by mouth daily. 09/06/13   [name], PA-C  glucose blood (ONETOUCH VERIO) test strip 4-5 TIMES PER DAY 05/17/16   [provider]  guaiFENesin (ROBITUSSIN) 100 MG/5ML liquid Take 5-10 mLs (100-200 mg total) by mouth every 4 (four) hours as needed for cough. 04/27/19   [name], NP  hydrALAZINE (APRESOLINE) 25 MG tablet Take 25 mg by mouth 3 (three) times daily.  06/22/16   [provider]  HYDROcodone-acetaminophen (NORCO/VICODIN) 5-325 MG tablet Take 1 tablet by mouth every 6 (six) hours as needed. 06/14/18   [name], DPM  Insulin Degludec 200 UNIT/ML SOPN Inject into the skin. 12/02/17   [provider]  isosorbide mononitrate (IMDUR) 120 MG 24 hr tablet TAKE 1 TABLET (120 MG TOTAL) BY MOUTH DAILY. Patient taking differently: Take 120 mg by mouth daily.  02/21/13   [name], MD  LANTUS 100 UNIT/ML injection Inject 40 Units into the skin at bedtime.  06/22/13   [provider]  levofloxacin (LEVAQUIN) 750 MG tablet Take 1 tablet (750 mg total) by mouth every other day. 04/27/19   [name], NP  lisinopril (PRINIVIL,ZESTRIL) 40 MG tablet Take 40 mg by mouth daily.  06/22/16   [provider]  meloxicam (MOBIC) 15 MG tablet TAKE ONE TABLET (15  MG DOSE) BY MOUTH DAILY. 12/19/17   [provider]  metoprolol succinate (TOPROL-XL) 100 MG 24 hr tablet Take 100 mg by mouth daily.  06/22/16   [provider]  METOPROLOL SUCCINATE ER PO  12/22/17   [provider]  mupirocin ointment (BACTROBAN) 2 % Apply 1 application topically 2 (two) times daily. 03/31/18   [name], DPM  omeprazole (PRILOSEC) 20 MG capsule Take 20 mg by mouth daily.  05/09/16   [provider]  OZEMPIC 1 MG/DOSE SOPN Inject 1 mg into the skin every Saturday.  09/25/16   [provider]  pravastatin (PRAVACHOL) 20 MG  tablet Take 20 mg by mouth at bedtime.  06/22/16   [provider]  promethazine (PHENERGAN) 25 MG tablet Take 1 tablet (25 mg total) by mouth every 8 (eight) hours as needed for nausea or vomiting. 06/14/18   [name], DPM  promethazine-dextromethorphan (PROMETHAZINE-DM) 6.25-15 MG/5ML syrup Take 5 mLs by mouth at bedtime as needed for cough. 03/28/19   [name], PA-C  silver sulfADIAZINE (SILVADENE) 1 % cream Apply 1 application topically daily. 07/16/16   [name], DPM  silver sulfADIAZINE (SILVADENE) 1 % cream Apply 1 application topically daily. 01/16/18   [name], DPM  tadalafil (CIALIS) 20 MG tablet  01/02/19   [provider]  tiZANidine (ZANAFLEX) 4 MG capsule Take 4 mg by mouth every 6 (six) hours as needed. 11/29/18   [provider]  torsemide (DEMADEX) 20 MG tablet Take 20 mg by mouth daily. 12/24/18   [provider]  traZODone (DESYREL) 50 MG tablet Take 0.5-1 tablets (25-50 mg total) by mouth at bedtime as needed for sleep. 08/20/17   Wendling, Crosby Oyster, DO  TRESIBA FLEXTOUCH 200 UNIT/ML SOPN INJECT 0.28 MLS (56 UNITS DOSE) INTO THE SKIN DAILY. 11/08/17   [provider]  potassium chloride (KLOR-CON) 20 MEQ packet Take 20 mEq by mouth 2 (two) times daily.  04/23/11  [provider]    Physical Exam: Vitals:    04/28/19 1145 04/28/19 1200 04/28/19 1215 04/28/19 1230  BP: (!) 193/101 (!) 169/123 (!) 189/104 (!) 202/106  Pulse: 69 71 71 72  Resp: 20 (!) _0 Temp:      TempSrc:      SpO2: 98% 99% 97% 99%    Constitutional: NAD, calm, comfortable Vitals:   04/28/19 1145 04/28/19 1200 04/28/19 1215 04/28/19 1230  BP: (!) 193/101 (!) 169/123 (!) 189/104 (!) 202/106  Pulse: 69 71 71 72  Resp: 20 (!) _1 Temp:      TempSrc:      SpO2: 98% 99% 97% 99%   Eyes: PERRL, lids and conjunctivae normal ENMT: Mucous membranes are moist. Posterior pharynx clear of any exudate or lesions.Normal dentition.  Neck: normal, supple, no masses, no thyromegaly Respiratory: clear to auscultation bilaterally, fine crackles to the mid levels of B/L fields. Normal respiratory effort. No accessory muscle use.  Cardiovascular: Regular rate and rhythm, no murmurs / rubs / gallops. 2+ extremity edema. 2+ pedal pulses. No carotid bruits.  Abdomen: no tenderness, no masses palpated. No hepatosplenomegaly. Bowel sounds positive.  Musculoskeletal: no clubbing / cyanosis. No joint deformity upper and lower extremities. Good ROM, no contractures. Normal muscle tone.  Skin: no rashes, lesions, ulcers. No induration Neurologic: CN 2-12 grossly intact. Sensation intact, DTR normal. Strength 5/5 in all 4.  Psychiatric: Normal judgment and insight. Alert and oriented x 3. Normal mood.     Labs on Admission: I have personally reviewed following labs and imaging studies  CBC: Recent Labs  Lab 04/27/19 0950 04/28/19 0616  WBC 9.2 8.4  NEUTROABS 6.6  --   HGB 13.3 13.1  HCT 39.9 39.4  MCV 91.1 92.5  PLT 324 893   Basic Metabolic Panel: Recent Labs  Lab 04/27/19 0950 04/28/19 0616  NA 139 139  K 3.3* 3.7  CL 102 104  CO2 28 23  GLUCOSE 134* 227*  BUN 14 14  CREATININE 1.61* 1.70*  CALCIUM 8.9 8.9   GFR: CrCl cannot be calculated (Unknown ideal weight.). Liver Function Tests: Recent Labs  Lab  04/27/19 0950  AST 19  ALT 19  ALKPHOS 108  BILITOT 0.8  PROT 6.4*  ALBUMIN 3.2*   No results for input(s): LIPASE, AMYLASE in the last 168 hours. No results for input(s): AMMONIA in the last 168 hours. Coagulation Profile: No results for input(s): INR, PROTIME in the last 168 hours. Cardiac Enzymes: No results for input(s): CKTOTAL, CKMB, CKMBINDEX, TROPONINI in the last 168 hours. BNP (last 3 results) No results for input(s): PROBNP in the last 8760 hours. HbA1C: No results for input(s): HGBA1C in the last 72 hours. CBG: No results for input(s): GLUCAP in the last 168 hours. Lipid Profile: No results for input(s): CHOL, HDL, LDLCALC, TRIG, CHOLHDL, LDLDIRECT in the last 72 hours. Thyroid Function Tests: No results for input(s): TSH, T4TOTAL, FREET4, T3FREE, THYROIDAB in the last 72 hours. Anemia Panel: No results for input(s): VITAMINB12, FOLATE, FERRITIN, TIBC, IRON, RETICCTPCT in the last 72 hours. Urine analysis: No results found for: COLORURINE, APPEARANCEUR, LABSPEC, PHURINE, GLUCOSEU, HGBUR, BILIRUBINUR, KETONESUR, PROTEINUR, UROBILINOGEN, NITRITE, LEUKOCYTESUR  Radiological Exams on Admission: DG Chest 2 View  Result Date: 04/27/2019 CLINICAL DATA:  Shortness of breath and cough EXAM: CHEST - 2 VIEW COMPARISON:  March 28, 2019 FINDINGS: There is ill-defined airspace opacity in portions of each lung base, slightly more on the right than on the left. Lungs elsewhere clear. Heart size and pulmonary vascularity are normal. No adenopathy. There is degenerative change in the thoracic spine. IMPRESSION: Bibasilar ill-defined opacity, slightly more on the right than the left. Question bibasilar pneumonia. No consolidation. Appearance raises question of atypical organism type pneumonia. Heart size within normal limits.  No evident adenopathy. These results will be called to the ordering clinician or representative by the Radiologist Assistant, and communication documented in the  PACS or [facility] Dashboard. Electronically Signed   By: William  Woodruff III M.D.   On: 04/27/2019 09:14   CT Angio Chest PE W and/or Wo Contrast  Result Date: 04/28/2019 CLINICAL DATA:  58-year-old male with acute shortness of breath. EXAM: CT ANGIOGRAPHY CHEST WITH CONTRAST TECHNIQUE: Multidetector CT imaging of the chest was performed using the standard protocol during bolus administration of intravenous contrast. Multiplanar CT image reconstructions and MIPs were obtained to evaluate the vascular anatomy. CONTRAST:  81mL OMNIPAQUE IOHEXOL 350 MG/ML SOLN COMPARISON:  04/28/2019 and prior chest radiographs FINDINGS: Cardiovascular: This is a technically satisfactory study for evaluation of pulmonary emboli. No pulmonary emboli are identified. Cardiomegaly identified. Coronary artery atherosclerotic calcifications are present. There is no evidence of thoracic aortic aneurysm or pericardial effusion. Mediastinum/Nodes: Mildly enlarged mediastinal/bilateral hilar lymph nodes are noted with index nodes as follows: A 1.7 cm RIGHT paratracheal node (series 5: Image 39) A 1.5 cm RIGHT hilar node (5:56) A 1.7 x 2.7 cm subcarinal node (5:61). No enlarged axillary lymph nodes are identified. The visualized thyroid gland and esophagus are unremarkable. Lungs/Pleura: Mild central and LOWER lung ground-glass opacities are noted. No evidence of airspace disease, mass or pneumothorax. Small bilateral pleural effusions are present. Upper Abdomen: No acute abnormality. No enlarged UPPER abdominal lymph nodes are identified. Musculoskeletal: No acute or suspicious bony abnormality. Review of the MIP images confirms the above findings. IMPRESSION: 1. No evidence of pulmonary emboli or thoracic aortic aneurysm. 2. Cardiomegaly, small bilateral pleural effusions and mild central and LOWER lung ground-glass opacities suggestive of pulmonary edema. Superimposed infection is not excluded. 3. Mildly enlarged mediastinal/bilateral hilar  lymph nodes - likely reactive given other thoracic findings. Consider chest CT follow-up in 3-6   months. 4. Coronary artery disease. Electronically Signed   By: Jeffrey  Hu M.D.   On: 04/28/2019 09:44   DG Chest Portable 1 View  Result Date: 04/28/2019 CLINICAL DATA:  Shortness of breath EXAM: PORTABLE CHEST 1 VIEW COMPARISON:  04/27/2019 FINDINGS: Mild bilateral lower lobe opacities, atelectasis versus pneumonia, similar. When compared to the prior, pulmonary vascular congestion is improved. No definite pleural effusions. No pneumothorax. The heart is normal in size. IMPRESSION: Mild bilateral lower lobe opacities, atelectasis versus pneumonia. Additional pulmonary vascular congestion is improved. Electronically Signed   By: Sriyesh  Krishnan M.D.   On: 04/28/2019 06:32    EKG: Independently reviewed. No acute ST-T changes  Assessment/Plan Active Problems:   CHF (congestive heart failure) (HCC)  Acute diastolic CHF decompensation Likely from Uncontrolled HTN. His symptoms much improved after one dose of IV Lasix in ED, will continue Lasix BID for now Echo ordered Low suspicion for PNA, he was treated for two times PNA in past month.  HTN emergency Improved Will titrate up his Amlodipine and Hydralazine dosage Consider change Metoprolol to Coreg in needed Continue ACEI and Imdur  Elevated Troponin No chest pain Trop level pattern is flat, not ACS, repeat Trop in AM  IDDM Continue Lantus Add sliding scale He does not use meal coverage at home, Send A1C, encourage to talk to his PCP about meal coverage insulin  CKD Stage II Cre level stable, with fluid overload, continue IV Lasix BMP in AM  Combined HLD Continue Statin and Fibrate  Resistant HTN May need to space out his BP meds  DVT prophylaxis: Heparin SubQ Code Status: Full Family Communication: None at bedside Disposition Plan: Likely can be discharged home in 24 hours if BP controlled and symptoms continue to  improve Consults called: None Admission status: Tele Obs   Ping T Zhang MD [facility] Pager [phone]    04/28/2019, 1:08 PM   

## 2019-04-28 NOTE — ED Triage Notes (Signed)
Pt arrives to ED w/ c/o of SOB secondary to pneumonia. Was seen at urgent care 1 month ago and diagnosed and symptoms have gotten progressively worse since. Pt denies chest pain.

## 2019-04-28 NOTE — ED Provider Notes (Signed)
[facility] EMERGENCY DEPARTMENT Provider Note   CSN: [id number] Arrival date & time: 04/28/19  0554     History Chief Complaint  Patient presents with  . Pneumonia    Roberto Allen is a 59 y.o. male.  Patient is a 59 year old gentleman with past medical history of coronary artery disease, CKD, COPD, diabetes, hypertension, hyper lipidemia, CHF presenting to the emergency department for shortness of breath.  Patient reports that about 1 month ago he was diagnosed with pneumonia and started on antibiotics.  Reports he completed these antibiotics and then felt a little bit better but was still short of breath.  Reports 2 days ago he became acutely worse.  Was seen at urgent care yesterday and had an x-ray showing persistent pneumonia and he was started on Levaquin.  Reports that he is taking the Levaquin since yesterday but feels like his shortness of breath is getting worse.  Shortness of breath is worse when he is lying flat at night trying to sleep and also when he tries to walk short distances reports only being able to the stretcher to the door without feeling short of breath.  Reports mild leg swelling.  Denies chest pain, fever, nausea, vomiting, diarrhea.  Has had 2 negative Covid test in the last month, the last one being yesterday in urgent care.        Past Medical History:  Diagnosis Date  . CAD (coronary artery disease)    a. LHC 4/13: mD1 25%, oD2 25%, pOM1 25%, pRCA 25%, dRCA 40%, EF 30%  . Chronic systolic heart failure (HCC)    a. EF 30-35% in 04/2011, improved to 55-60% in 01/2012.  Marland Kitchen CKD (chronic kidney disease), stage II   . COPD (chronic obstructive pulmonary disease) ([place])   . Diabetes mellitus   . GERD (gastroesophageal reflux disease)   . History of pneumonia   . Hx of cardiovascular stress test    a. Nuclear (12/2012):  No ischemia, EF 59%  . Hyperkalemia    a. 12/2012 - spironolactone, KCl supp discontinued.  . Hyperlipidemia   .  Hypertension   . Hypertriglyceridemia    a. 12/2012 - started on fenofibrate.  . Knee pain   . NICM (nonischemic cardiomyopathy) ([place])    a. Echo 3/13: mild LVH, EF 30-35%, grade 3 diast dysfxn, mod LAE;  RHC 4/13:  RA 8, RV 58/10, PA 56/18, mean 35, PCWP mean 17, LV 154/27, CO 4.3, CI 2.0. b. EF Improved >>> Echo (01/2012):  Mild LVH, EF 55-60%, no RWMA, Gr 1 DD    Patient Active Problem List   Diagnosis Date Noted  . CHF (congestive heart failure) ([place]) 04/28/2019  . Hypertension associated with stage 4 chronic kidney disease due to type 2 diabetes mellitus ([place]) 08/30/2017  . Hypertension secondary to other renal disorders 11/29/2016  . GERD (gastroesophageal reflux disease) 07/23/2016  . Degenerative disc disease at L5-S1 level 04/09/2016  . Primary osteoarthritis of right hip 04/09/2016  . Obesity, Class I, BMI 30-34.9 02/06/2016  . Ulcer of left foot ([place]) 01/06/2015  . Microalbuminuria due to type 2 diabetes mellitus ([place]) 10/03/2014  . 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 diabetes mellitus ([place]) 06/26/2014  . NICM (nonischemic cardiomyopathy) ([place]) 09/06/2013  . ED (erectile dysfunction) 03/27/2013  . Acute on chronic renal insufficiency 12/17/2012  . Hypertriglyceridemia 12/17/2012  . Disorder of kidney and ureter 12/17/2012  . Precordial chest pain 12/16/2012  . Hyperkalemia 12/16/2012  . Diabetes mellitus ([place]) 12/16/2012  .  Coronary Artery Disease (non-obstructive by cath 05/2011) 06/14/2011  . Hyperlipidemia 06/14/2011  . OSA (obstructive sleep apnea) 05/25/2011  . Chronic systolic heart failure ([place]) 05/11/2011  . Heart failure ([place]) 04/27/2011  . Hypertension   . SOB (shortness of breath)     Past Surgical History:  Procedure Laterality Date  . None         Family History  Problem Relation Age of Onset  . Diabetes Sister   . Breast cancer Mother   . Breast cancer Maternal Grandmother     Social History   Tobacco Use  . Smoking status: Former Smoker     Packs/day: 1.00    Years: 20.00    Pack years: 20.00    Types: Cigarettes    Quit date: 09/15/2006    Years since quitting: 12.6  . Smokeless tobacco: Never Used  Substance Use Topics  . Alcohol use: Yes    Comment: occ  . Drug use: No    Home Medications Prior to Admission medications   Medication Sig Start Date End Date Taking? Authorizing Provider  atorvastatin (LIPITOR) 10 MG tablet Take 10 mg by mouth every morning.  03/31/18  Yes [provider]  furosemide (LASIX) 20 MG tablet Take 20 mg by mouth daily.   Yes [provider]  hydrALAZINE (APRESOLINE) 25 MG tablet Take 25 mg by mouth 3 (three) times daily.  06/22/16  Yes [provider]  LANTUS 100 UNIT/ML injection Inject 40 Units into the skin at bedtime.  06/22/13  Yes [provider]  lisinopril (PRINIVIL,ZESTRIL) 40 MG tablet Take 40 mg by mouth at bedtime.  06/22/16  Yes [provider]  metoprolol succinate (TOPROL-XL) 100 MG 24 hr tablet Take 100 mg by mouth daily.  06/22/16  Yes [provider]  omeprazole (PRILOSEC) 20 MG capsule Take 20 mg by mouth daily.  05/09/16  Yes [provider]  OZEMPIC 1 MG/DOSE SOPN Inject 1 mg into the skin every Saturday.  09/25/16  Yes [provider]  ACCU-CHEK FASTCLIX LANCETS MISC USE TO MONITOR BLOOD GLUCOSE 3 TIME(S) DAILY 05/05/17   [provider]  BD PEN NEEDLE NANO U/F 32G X 4 MM MISC 1 EACH BY DOES NOT APPLY ROUTE 2 (TWO) TIMES DAILY. 05/18/16   [provider]  benzonatate (TESSALON) 100 MG capsule Take 1-2 capsules (100-200 mg total) by mouth 3 (three) times daily as needed. Patient not taking: Reported on 04/28/2019 03/28/19   [name], PA-C  Blood Glucose Monitoring Suppl (ACCU-CHEK GUIDE) w/Device KIT 3 TIMES A DAY. USE AS DIRECTED BY PHYSICIAN TO MONITOR BLOOD SUGARS 04/05/17   [provider]  fenofibrate (TRICOR) 48 MG tablet Take 1 tablet (48 mg total) by mouth daily. Patient not taking:  Reported on 04/28/2019 12/17/12   [name], PA-C  furosemide (LASIX) 40 MG tablet Take 1 tablet (40 mg total) by mouth daily. Patient not taking: Reported on 04/28/2019 09/06/13   [name], PA-C  glucose blood [facility] VERIO) test strip 4-5 TIMES PER DAY 05/17/16   [provider]  guaiFENesin (ROBITUSSIN) 100 MG/5ML liquid Take 5-10 mLs (100-200 mg total) by mouth every 4 (four) hours as needed for cough. Patient not taking: Reported on 04/28/2019 04/27/19   [name], NP  HYDROcodone-acetaminophen (NORCO/VICODIN) 5-325 MG tablet Take 1 tablet by mouth every 6 (six) hours as needed. Patient not taking: Reported on 04/28/2019 06/14/18   [name], DPM  isosorbide mononitrate (IMDUR) 120 MG 24  hr tablet TAKE 1 TABLET (120 MG TOTAL) BY MOUTH DAILY. Patient not taking: No sig reported 02/21/13   [name], MD  promethazine (PHENERGAN) 25 MG tablet Take 1 tablet (25 mg total) by mouth every 8 (eight) hours as needed for nausea or vomiting. Patient not taking: Reported on 04/28/2019 06/14/18   [name], DPM  promethazine-dextromethorphan (PROMETHAZINE-DM) 6.25-15 MG/5ML syrup Take 5 mLs by mouth at bedtime as needed for cough. Patient not taking: Reported on 04/28/2019 03/28/19   [name], PA-C  silver sulfADIAZINE (SILVADENE) 1 % cream Apply 1 application topically daily. Patient not taking: Reported on 04/28/2019 07/16/16   [name], DPM  silver sulfADIAZINE (SILVADENE) 1 % cream Apply 1 application topically daily. Patient not taking: Reported on 04/28/2019 01/16/18   [name], DPM  traZODone (DESYREL) 50 MG tablet Take 0.5-1 tablets (25-50 mg total) by mouth at bedtime as needed for sleep. Patient not taking: Reported on 04/28/2019 08/20/17   Shelda Pal, DO  potassium chloride (KLOR-CON) 20 MEQ packet Take 20 mEq by mouth 2 (two) times daily.  04/23/11  [provider]    Allergies    Metformin and related  Review of  Systems   Review of Systems  Constitutional: Negative for appetite change, chills and fever.  HENT: Negative for congestion and sore throat.   Respiratory: Positive for cough and shortness of breath. Negative for wheezing.   Cardiovascular: Negative for chest pain.  Gastrointestinal: Negative for abdominal pain and nausea.  Genitourinary: Negative for dysuria.  Musculoskeletal: Negative for arthralgias and myalgias.  Neurological: Negative for dizziness and light-headedness.  All other systems reviewed and are negative.   Physical Exam Updated Vital Signs BP 140/69 (BP Location: Left Arm)   Pulse 71   Temp 98.2 F (36.8 C) (Oral)   Resp 16   Ht 5' 11"  (1.803 m)   Wt 100.3 kg   SpO2 94%   BMI 30.85 kg/m   Physical Exam Vitals and nursing note reviewed.  Constitutional:      General: He is not in acute distress.    Appearance: Normal appearance. He is not ill-appearing, toxic-appearing or diaphoretic.  HENT:     Head: Normocephalic and atraumatic.     Mouth/Throat:     Mouth: Mucous membranes are moist.  Eyes:     Pupils: Pupils are equal, round, and reactive to light.  Cardiovascular:     Rate and Rhythm: Normal rate and regular rhythm.  Abdominal:     General: Abdomen is flat.  Musculoskeletal:     Right lower leg: Edema present.     Left lower leg: Edema present.     Comments: Mild LE edema  Neurological:     Mental Status: He is alert.  Psychiatric:        Mood and Affect: Mood normal.     ED Results / Procedures / Treatments   Labs (all labs ordered are listed, but only abnormal results are displayed) Labs Reviewed  BASIC METABOLIC PANEL - Abnormal; Notable for the following components:      Result Value   Glucose, Bld 227 (*)    Creatinine, Ser 1.70 (*)    GFR calc non Af Amer 43 (*)    GFR calc Af Amer 50 (*)    All other components within normal limits  BRAIN NATRIURETIC PEPTIDE - Abnormal; Notable for the following components:   B Natriuretic  Peptide 469.9 (*)    All other components within normal  limits  D-DIMER, QUANTITATIVE (NOT AT [facility]) - Abnormal; Notable for the following components:   D-Dimer, Quant 1.40 (*)    All other components within normal limits  GLUCOSE, CAPILLARY - Abnormal; Notable for the following components:   Glucose-Capillary 111 (*)    All other components within normal limits  GLUCOSE, CAPILLARY - Abnormal; Notable for the following components:   Glucose-Capillary 203 (*)    All other components within normal limits  GLUCOSE, CAPILLARY - Abnormal; Notable for the following components:   Glucose-Capillary 132 (*)    All other components within normal limits  TROPONIN I (HIGH SENSITIVITY) - Abnormal; Notable for the following components:   Troponin I (High Sensitivity) 38 (*)    All other components within normal limits  TROPONIN I (HIGH SENSITIVITY) - Abnormal; Notable for the following components:   Troponin I (High Sensitivity) 43 (*)    All other components within normal limits  SARS CORONAVIRUS 2 (TAT 6-24 HRS)  CBC  HIV ANTIBODY (ROUTINE TESTING W REFLEX)  TSH  BASIC METABOLIC PANEL  MAGNESIUM  TROPONIN I (HIGH SENSITIVITY)    EKG EKG Interpretation  Date/Time:  Saturday April 28 2019 06:45:47 EST Ventricular Rate:  71 PR Interval:    QRS Duration: 102 QT Interval:  453 QTC Calculation: 493 R Axis:   24 Text Interpretation: Sinus rhythm Prolonged PR interval Confirmed by Dory Horn) on 04/28/2019 6:56:23 AM   Radiology DG Chest 2 View  Result Date: 04/27/2019 CLINICAL DATA:  Shortness of breath and cough EXAM: CHEST - 2 VIEW COMPARISON:  March 28, 2019 FINDINGS: There is ill-defined airspace opacity in portions of each lung base, slightly more on the right than on the left. Lungs elsewhere clear. Heart size and pulmonary vascularity are normal. No adenopathy. There is degenerative change in the thoracic spine. IMPRESSION: Bibasilar ill-defined opacity, slightly more on  the right than the left. Question bibasilar pneumonia. No consolidation. Appearance raises question of atypical organism type pneumonia. Heart size within normal limits.  No evident adenopathy. These results will be called to the ordering clinician or representative by the Radiologist Assistant, and communication documented in the PACS or [employer]. Electronically Signed   By: Lowella Grip III M.D.   On: 04/27/2019 09:14   CT Angio Chest PE W and/or Wo Contrast  Result Date: 04/28/2019 CLINICAL DATA:  59 year old male with acute shortness of breath. EXAM: CT ANGIOGRAPHY CHEST WITH CONTRAST TECHNIQUE: Multidetector CT imaging of the chest was performed using the standard protocol during bolus administration of intravenous contrast. Multiplanar CT image reconstructions and MIPs were obtained to evaluate the vascular anatomy. CONTRAST:  4m OMNIPAQUE IOHEXOL 350 MG/ML SOLN COMPARISON:  04/28/2019 and prior chest radiographs FINDINGS: Cardiovascular: This is a technically satisfactory study for evaluation of pulmonary emboli. No pulmonary emboli are identified. Cardiomegaly identified. Coronary artery atherosclerotic calcifications are present. There is no evidence of thoracic aortic aneurysm or pericardial effusion. Mediastinum/Nodes: Mildly enlarged mediastinal/bilateral hilar lymph nodes are noted with index nodes as follows: A 1.7 cm RIGHT paratracheal node (series 5: Image 39) A 1.5 cm RIGHT hilar node (5:56) A 1.7 x 2.7 cm subcarinal node (5:61). No enlarged axillary lymph nodes are identified. The visualized thyroid gland and esophagus are unremarkable. Lungs/Pleura: Mild central and LOWER lung ground-glass opacities are noted. No evidence of airspace disease, mass or pneumothorax. Small bilateral pleural effusions are present. Upper Abdomen: No acute abnormality. No enlarged UPPER abdominal lymph nodes are identified. Musculoskeletal: No acute or suspicious bony abnormality. Review  of the MIP  images confirms the above findings. IMPRESSION: 1. No evidence of pulmonary emboli or thoracic aortic aneurysm. 2. Cardiomegaly, small bilateral pleural effusions and mild central and LOWER lung ground-glass opacities suggestive of pulmonary edema. Superimposed infection is not excluded. 3. Mildly enlarged mediastinal/bilateral hilar lymph nodes - likely reactive given other thoracic findings. Consider chest CT follow-up in 3-6 months. 4. Coronary artery disease. Electronically Signed   By: Margarette Canada M.D.   On: 04/28/2019 09:44   DG Chest Portable 1 View  Result Date: 04/28/2019 CLINICAL DATA:  Shortness of breath EXAM: PORTABLE CHEST 1 VIEW COMPARISON:  04/27/2019 FINDINGS: Mild bilateral lower lobe opacities, atelectasis versus pneumonia, similar. When compared to the prior, pulmonary vascular congestion is improved. No definite pleural effusions. No pneumothorax. The heart is normal in size. IMPRESSION: Mild bilateral lower lobe opacities, atelectasis versus pneumonia. Additional pulmonary vascular congestion is improved. Electronically Signed   By: Julian Hy M.D.   On: 04/28/2019 06:32   ECHOCARDIOGRAM COMPLETE  Result Date: 04/28/2019    ECHOCARDIOGRAM REPORT   Patient Name:   HAIG GERARDO Date of Exam: 04/28/2019 Medical Rec #:  [id number]      Height:       71.0 in Accession #:    [id number]     Weight:       228.8 lb Date of Birth:  1960/11/18      BSA:          2.233 m Patient Age:    33 years       BP:           147/104 mmHg Patient Gender: M              HR:           69 bpm. Exam Location:  Inpatient Procedure: 2D Echo Indications:    acute diastolic chf 423.53  History:        Patient has prior history of Echocardiogram examinations, most                 recent 01/19/2012. CAD, chronic kidney disease; Risk                 Factors:Dyslipidemia, Hypertension and Sleep Apnea.  Sonographer:    Johny Chess Referring Phys: [id number] [place]  1. Left ventricular ejection  fraction, by estimation, is 50 to 55%. The left ventricle has low normal function. The left ventricle demonstrates regional wall motion abnormalities (see scoring diagram/findings for description). There is mild left ventricular hypertrophy. Left ventricular diastolic parameters are indeterminate.  2. Right ventricular systolic function is normal. The right ventricular size is normal. Tricuspid regurgitation signal is inadequate for assessing PA pressure.  3. The mitral valve is normal in structure. No evidence of mitral valve regurgitation. No evidence of mitral stenosis.  4. The aortic valve is normal in structure. Aortic valve regurgitation is trivial. No aortic stenosis is present.  5. The inferior vena cava is normal in size with greater than 50% respiratory variability, suggesting right atrial pressure of 3 mmHg. FINDINGS  Left Ventricle: Left ventricular ejection fraction, by estimation, is 50 to 55%. The left ventricle has low normal function. The left ventricle demonstrates regional wall motion abnormalities. The left ventricular internal cavity size was normal in size. There is mild left ventricular hypertrophy. Left ventricular diastolic parameters are indeterminate.  LV Wall Scoring: The entire inferior wall is hypokinetic. Right Ventricle: The right ventricular size is normal. No increase in  right ventricular wall thickness. Right ventricular systolic function is normal. Tricuspid regurgitation signal is inadequate for assessing PA pressure. Left Atrium: Left atrial size was normal in size. Right Atrium: Right atrial size was normal in size. Pericardium: There is no evidence of pericardial effusion. Mitral Valve: The mitral valve is normal in structure. Normal mobility of the mitral valve leaflets. No evidence of mitral valve regurgitation. No evidence of mitral valve stenosis. Tricuspid Valve: The tricuspid valve is normal in structure. Tricuspid valve regurgitation is not demonstrated. No evidence of  tricuspid stenosis. Aortic Valve: The aortic valve is normal in structure. Aortic valve regurgitation is trivial. No aortic stenosis is present. Pulmonic Valve: The pulmonic valve was normal in structure. Pulmonic valve regurgitation is not visualized. No evidence of pulmonic stenosis. Aorta: The aortic root is normal in size and structure. Venous: The inferior vena cava is normal in size with greater than 50% respiratory variability, suggesting right atrial pressure of 3 mmHg. IAS/Shunts: No atrial level shunt detected by color flow Doppler.  LEFT VENTRICLE PLAX 2D LVIDd:         4.60 cm  Diastology LVIDs:         3.90 cm  LV e' lateral:   3.59 cm/s LV PW:         1.50 cm  LV E/e' lateral: 11.1 LV IVS:        1.00 cm  LV e' medial:    3.81 cm/s LVOT diam:     2.20 cm  LV E/e' medial:  10.5 LV SV:         49 LV SV Index:   22 LVOT Area:     3.80 cm  RIGHT VENTRICLE RV S prime:     13.80 cm/s TAPSE (M-mode): 1.8 cm LEFT ATRIUM             Index       RIGHT ATRIUM           Index LA diam:        4.30 cm 1.93 cm/m  RA Area:     13.70 cm LA Vol (A2C):   63.6 ml 28.49 ml/m RA Volume:   34.20 ml  15.32 ml/m LA Vol (A4C):   49.6 ml 22.22 ml/m LA Biplane Vol: 55.8 ml 24.99 ml/m  AORTIC VALVE LVOT Vmax:   65.80 cm/s LVOT Vmean:  40.100 cm/s LVOT VTI:    0.130 m  AORTA Ao Root diam: 3.20 cm MV E velocity: 39.90 cm/s MV A velocity: 60.70 cm/s  SHUNTS MV E/A ratio:  0.66        Systemic VTI:  0.13 m                            Systemic Diam: 2.20 cm Cherlynn Kaiser MD Electronically signed by Cherlynn Kaiser MD Signature Date/Time: 04/28/2019/6:56:07 PM    Final     Procedures Procedures (including critical care time)  Medications Ordered in ED Medications  aspirin EC tablet 81 mg (81 mg Oral Given 04/28/19 1449)  atorvastatin (LIPITOR) tablet 10 mg (10 mg Oral Given 04/28/19 1753)  furosemide (LASIX) injection 40 mg (40 mg Intravenous Given 04/28/19 2123)  hydrALAZINE (APRESOLINE) tablet 50 mg (50 mg Oral Given  04/28/19 2124)  isosorbide mononitrate (IMDUR) 24 hr tablet 120 mg (120 mg Oral Given 04/28/19 1446)  lisinopril (ZESTRIL) tablet 40 mg (40 mg Oral Given 04/28/19 1450)  metoprolol succinate (TOPROL-XL) 24 hr tablet 100 mg (  100 mg Oral Given 04/28/19 1446)  traZODone (DESYREL) tablet 50 mg (has no administration in time range)  insulin glargine (LANTUS) injection 40 Units (40 Units Subcutaneous Given 04/28/19 2302)  pantoprazole (PROTONIX) EC tablet 40 mg (40 mg Oral Given 04/28/19 1450)  sodium chloride flush (NS) 0.9 % injection 3 mL (3 mLs Intravenous Given 04/28/19 2124)  sodium chloride flush (NS) 0.9 % injection 3 mL (has no administration in time range)  0.9 %  sodium chloride infusion (has no administration in time range)  acetaminophen (TYLENOL) tablet 650 mg (650 mg Oral Given 04/29/19 0635)  ondansetron (ZOFRAN) injection 4 mg (has no administration in time range)  heparin injection 5,000 Units (5,000 Units Subcutaneous Given 04/29/19 0635)  insulin aspart (novoLOG) injection 0-15 Units (0 Units Subcutaneous Not Given 04/28/19 1739)  amLODipine (NORVASC) tablet 10 mg (10 mg Oral Given 04/28/19 1449)  sodium chloride flush (NS) 0.9 % injection 3 mL (3 mLs Intravenous Given 04/28/19 0813)  iohexol (OMNIPAQUE) 350 MG/ML injection 100 mL (81 mLs Intravenous Contrast Given 04/28/19 0927)  furosemide (LASIX) injection 40 mg (40 mg Intravenous Given 04/28/19 1200)    ED Course  I have reviewed the triage vital signs and the nursing notes.  Pertinent labs & imaging results that were available during my care of the patient were reviewed by me and considered in my medical decision making (see chart for details).  Clinical Course as of Apr 29 646  Sat Apr 28, 2019  9924 Hemoglobin: 13.1 [KM]  Sun Apr 29, 2019  0646 Patient with history of CHF presenting with worsening dyspnea, orthopnea, PND over 1 month. Treated with two rounds of abx for ? Pneumonia on chest xray. Patient hypertensive but otherwise  stable vitals here. Appears mildly volume overloaded. Workup consistent with CHF exacerbation rather than pneumonia. Also had 2 negative covid tests in the last month. Patient has not seen cardiologist in 6 years. Will admit to hospitalist for diuresis and further workup.    [KM]    Clinical Course User Index [KM] Kristine Royal   MDM Rules/Calculators/A&P                      The patient appears reasonably stabilized for admission considering the current resources, flow, and capabilities available in the ED at this time, and I doubt any other [facility] requiring further screening and/or treatment in the ED prior to admission.  Final Clinical Impression(s) / ED Diagnoses Final diagnoses:  Acute on chronic congestive heart failure, unspecified heart failure type [facility])    Rx / DC Orders ED Discharge Orders    None       Kristine Royal 04/29/19 2683    Little, Wenda Overland, MD 04/29/19 [phone]

## 2019-04-29 DIAGNOSIS — I10 Essential (primary) hypertension: Secondary | ICD-10-CM | POA: Diagnosis not present

## 2019-04-29 DIAGNOSIS — Z79899 Other long term (current) drug therapy: Secondary | ICD-10-CM | POA: Diagnosis not present

## 2019-04-29 DIAGNOSIS — I5043 Acute on chronic combined systolic (congestive) and diastolic (congestive) heart failure: Secondary | ICD-10-CM | POA: Diagnosis not present

## 2019-04-29 DIAGNOSIS — Z87891 Personal history of nicotine dependence: Secondary | ICD-10-CM | POA: Diagnosis not present

## 2019-04-29 DIAGNOSIS — I2510 Atherosclerotic heart disease of native coronary artery without angina pectoris: Secondary | ICD-10-CM | POA: Diagnosis present

## 2019-04-29 DIAGNOSIS — N1831 Chronic kidney disease, stage 3a: Secondary | ICD-10-CM | POA: Diagnosis present

## 2019-04-29 DIAGNOSIS — I509 Heart failure, unspecified: Secondary | ICD-10-CM | POA: Diagnosis present

## 2019-04-29 DIAGNOSIS — E1121 Type 2 diabetes mellitus with diabetic nephropathy: Secondary | ICD-10-CM | POA: Diagnosis not present

## 2019-04-29 DIAGNOSIS — Z888 Allergy status to other drugs, medicaments and biological substances status: Secondary | ICD-10-CM | POA: Diagnosis not present

## 2019-04-29 DIAGNOSIS — Z833 Family history of diabetes mellitus: Secondary | ICD-10-CM | POA: Diagnosis not present

## 2019-04-29 DIAGNOSIS — Z6831 Body mass index (BMI) 31.0-31.9, adult: Secondary | ICD-10-CM | POA: Diagnosis not present

## 2019-04-29 DIAGNOSIS — Z7982 Long term (current) use of aspirin: Secondary | ICD-10-CM | POA: Diagnosis not present

## 2019-04-29 DIAGNOSIS — E781 Pure hyperglyceridemia: Secondary | ICD-10-CM | POA: Diagnosis present

## 2019-04-29 DIAGNOSIS — E782 Mixed hyperlipidemia: Secondary | ICD-10-CM | POA: Diagnosis present

## 2019-04-29 DIAGNOSIS — K219 Gastro-esophageal reflux disease without esophagitis: Secondary | ICD-10-CM | POA: Diagnosis present

## 2019-04-29 DIAGNOSIS — N183 Chronic kidney disease, stage 3 unspecified: Secondary | ICD-10-CM | POA: Diagnosis not present

## 2019-04-29 DIAGNOSIS — I5033 Acute on chronic diastolic (congestive) heart failure: Secondary | ICD-10-CM | POA: Diagnosis present

## 2019-04-29 DIAGNOSIS — E876 Hypokalemia: Secondary | ICD-10-CM | POA: Diagnosis present

## 2019-04-29 DIAGNOSIS — Z794 Long term (current) use of insulin: Secondary | ICD-10-CM | POA: Diagnosis not present

## 2019-04-29 DIAGNOSIS — Z20822 Contact with and (suspected) exposure to covid-19: Secondary | ICD-10-CM | POA: Diagnosis present

## 2019-04-29 DIAGNOSIS — Z803 Family history of malignant neoplasm of breast: Secondary | ICD-10-CM | POA: Diagnosis not present

## 2019-04-29 DIAGNOSIS — I428 Other cardiomyopathies: Secondary | ICD-10-CM | POA: Diagnosis present

## 2019-04-29 DIAGNOSIS — I161 Hypertensive emergency: Secondary | ICD-10-CM | POA: Diagnosis present

## 2019-04-29 DIAGNOSIS — N049 Nephrotic syndrome with unspecified morphologic changes: Secondary | ICD-10-CM | POA: Diagnosis present

## 2019-04-29 DIAGNOSIS — E669 Obesity, unspecified: Secondary | ICD-10-CM | POA: Diagnosis present

## 2019-04-29 DIAGNOSIS — G4733 Obstructive sleep apnea (adult) (pediatric): Secondary | ICD-10-CM | POA: Diagnosis present

## 2019-04-29 DIAGNOSIS — E1122 Type 2 diabetes mellitus with diabetic chronic kidney disease: Secondary | ICD-10-CM | POA: Diagnosis present

## 2019-04-29 DIAGNOSIS — I130 Hypertensive heart and chronic kidney disease with heart failure and stage 1 through stage 4 chronic kidney disease, or unspecified chronic kidney disease: Secondary | ICD-10-CM | POA: Diagnosis present

## 2019-04-29 LAB — GLUCOSE, CAPILLARY
Glucose-Capillary: 132 mg/dL — ABNORMAL HIGH (ref 70–99)
Glucose-Capillary: 85 mg/dL (ref 70–99)
Glucose-Capillary: 137 mg/dL — ABNORMAL HIGH (ref 70–99)
Glucose-Capillary: 160 mg/dL — ABNORMAL HIGH (ref 70–99)

## 2019-04-29 LAB — BASIC METABOLIC PANEL
Anion gap: 14 (ref 5–15)
BUN: 15 mg/dL (ref 6–20)
CO2: 28 mmol/L (ref 22–32)
Calcium: 8.9 mg/dL (ref 8.9–10.3)
Chloride: 98 mmol/L (ref 98–111)
Creatinine, Ser: 1.95 mg/dL — ABNORMAL HIGH (ref 0.61–1.24)
GFR calc Af Amer: 42 mL/min — ABNORMAL LOW (ref >60)
GFR calc non Af Amer: 36 mL/min — ABNORMAL LOW (ref >60)
Glucose, Bld: 163 mg/dL — ABNORMAL HIGH (ref 70–99)
Potassium: 3.1 mmol/L — ABNORMAL LOW (ref 3.5–5.1)
Sodium: 140 mmol/L (ref 135–145)

## 2019-04-29 LAB — RAPID URINE DRUG SCREEN, HOSP PERFORMED
Amphetamines: NOT DETECTED
Barbiturates: NOT DETECTED
Benzodiazepines: NOT DETECTED
Cocaine: NOT DETECTED
Opiates: NOT DETECTED
Tetrahydrocannabinol: NOT DETECTED

## 2019-04-29 LAB — MAGNESIUM: Magnesium: 1.7 mg/dL (ref 1.7–2.4)

## 2019-04-29 LAB — TROPONIN I (HIGH SENSITIVITY): Troponin I (High Sensitivity): 27 ng/L — ABNORMAL HIGH (ref <18)

## 2019-04-29 MED ORDER — HYDRALAZINE HCL 25 MG PO TABS
25.0000 mg | ORAL_TABLET | Freq: Three times a day (TID) | ORAL | Status: DC
  Administered 2019-04-29 – 2019-05-01 (×6): 25 mg via ORAL
  Filled 2019-04-29 (×6): qty 1

## 2019-04-29 MED ORDER — AMLODIPINE BESYLATE 5 MG PO TABS
5.0000 mg | ORAL_TABLET | Freq: Every day | ORAL | Status: DC
  Administered 2019-04-30 – 2019-05-01 (×2): 5 mg via ORAL
  Filled 2019-04-29 (×2): qty 1

## 2019-04-29 MED ORDER — POTASSIUM CHLORIDE CRYS ER 20 MEQ PO TBCR
40.0000 meq | EXTENDED_RELEASE_TABLET | Freq: Once | ORAL | Status: AC
  Administered 2019-04-29: 40 meq via ORAL
  Filled 2019-04-29: qty 2

## 2019-04-29 NOTE — Progress Notes (Signed)
ReDS Clip Diuretic Study Pt study # [id number]  Your patient is in the Blinded arm of the ReDS Clip Diuretic study.  Your patient has had a ReDS reading and the reading has been transmitted to the cloud.   Thank You   The research team   Vertis Kelch, PharmD, [facility] PGY2 Cardiology Pharmacy Resident Phone [phone] 04/29/2019       10:03 AM  Please check [email or web address] for unit-specific pharmacist phone numbers

## 2019-04-29 NOTE — Progress Notes (Addendum)
Progress Note    Roberto Allen  LDJ:[id number] DOB: 1960-04-03  DOA: 04/28/2019 PCP: [name], MD    Brief Narrative:     Medical records reviewed and are as summarized below:  Roberto Allen is an 59 y.o. male who presented from home with shortness of breath.  Patient states that over the last month he has gained about 15 pounds and had increased leg swelling.  Patient prior followed with cardiology but he states he was released from their service as he no longer needed to follow-up.  I cannot find any notes in the chart to substantiate this.   Assessment/Plan:   Active Problems:   Hypertension   Diabetes mellitus ([place])   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 diabetes mellitus (HCC)   Obesity, Class I, BMI 30-34.9   CHF (congestive heart failure) (HCC)    Acute diastolic CHF decompensation Likely from Uncontrolled HTN.  -His symptoms much improved after one dose of IV Lasix in ED, will continue Lasix BID for now Echo :Left ventricular ejection fraction, by estimation, is 50 to 55%. The  left ventricle has low normal function. The left ventricle demonstrates  regional wall motion abnormalities (see scoring diagram/findings for  description). There is mild left  ventricular hypertrophy. Left ventricular diastolic parameters are  indeterminate.  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  Hypokalemia -Replete  Family Communication/Anticipated D/C date and plan/Code Status   DVT prophylaxis: Heparin subcu Code Status: Full Code.  Family Communication:  Disposition Plan: Plan to discharge home once patient has diuresed completely patient will need follow-up arrangements for cardiology   Medical Consultants:    None.     Subjective:   States his breathing is not back to his baseline but feels like it has improved from admission  Objective:    Vitals:   04/29/19 0012 04/29/19 0503 04/29/19 0527 04/29/19 0934  BP: 127/67 140/69  116/71  Pulse: 70 71  74  Resp: 16 16  20   Temp: 98.1 F (36.7 C) 98.2 F (36.8 C)    TempSrc: Oral Oral    SpO2: 93% 94%  97%  Weight:   100.3 kg   Height:        Intake/Output Summary (Last 24 hours) at 04/29/2019 1128 Last data filed at 04/29/2019 0300 Gross per 24 hour  Intake 600 ml  Output 1960 ml  Net -1360 ml   Filed Weights   04/28/19 1629 04/29/19 0527  Weight: 103.9 kg 100.3 kg    Exam: In bed, under covers No increased work of breathing Diminished breath sounds with prolonged expiratory phase Regular rate and rhythm Pleasant and cooperative Alert and oriented x3  Data Reviewed:   I have personally reviewed following labs and imaging studies:  Labs: Labs show the following:   Basic  Metabolic Panel: Recent Labs  Lab 04/27/19 0950 04/27/19 0950 04/28/19 0616 04/29/19 0516  NA 139  --  139 140  K 3.3*   < > 3.7 3.1*  CL 102  --  104 98  CO2 28  --  23 28  GLUCOSE 134*  --  227* 163*  BUN 14  --  14 15  CREATININE 1.61*  --  1.70* 1.95*  CALCIUM 8.9  --  8.9 8.9  MG  --   --   --  1.7   < > = values in this interval not displayed.   GFR Estimated Creatinine Clearance: 48.6 mL/min (A) (by C-G formula based on SCr of 1.95 mg/dL (H)). Liver Function Tests: Recent Labs  Lab 04/27/19 0950  AST 19  ALT 19   ALKPHOS 108  BILITOT 0.8  PROT 6.4*  ALBUMIN 3.2*   No results for input(s): LIPASE, AMYLASE in the last 168 hours. No results for input(s): AMMONIA in the last 168 hours. Coagulation profile No results for input(s): INR, PROTIME in the last 168 hours.  CBC: Recent Labs  Lab 04/27/19 0950 04/28/19 0616  WBC 9.2 8.4  NEUTROABS 6.6  --   HGB 13.3 13.1  HCT 39.9 39.4  MCV 91.1 92.5  PLT 324 331   Cardiac Enzymes: No results for input(s): CKTOTAL, CKMB, CKMBINDEX, TROPONINI in the last 168 hours. BNP (last 3 results) No results for input(s): PROBNP in the last 8760 hours. CBG: Recent Labs  Lab 04/28/19 1725 04/28/19 2118 04/29/19 0615  GLUCAP 111* 203* 132*   D-Dimer: Recent Labs    04/28/19 0748  DDIMER 1.40*   Hgb A1c: No results for input(s): HGBA1C in the last 72 hours. Lipid Profile: No results for input(s): CHOL, HDL, LDLCALC, TRIG, CHOLHDL, LDLDIRECT in the last 72 hours. Thyroid function studies: Recent Labs    04/28/19 1506  TSH 2.650   Anemia work up: No results for input(s): VITAMINB12, FOLATE, FERRITIN, TIBC, IRON, RETICCTPCT in the last 72 hours. Sepsis Labs: Recent Labs  Lab 04/27/19 0950 04/28/19 0616  WBC 9.2 8.4    Microbiology Recent Results (from the past 240 hour(s))  SARS CORONAVIRUS 2 (TAT 6-24 HRS) Nasopharyngeal Nasopharyngeal Swab     Status: None   Collection Time: 04/27/19  9:49 AM   Specimen: Nasopharyngeal Swab  Result Value Ref Range Status   SARS Coronavirus 2 NEGATIVE NEGATIVE Final    Comment: (NOTE) SARS-CoV-2 target nucleic acids are NOT DETECTED. The SARS-CoV-2 RNA is generally detectable in upper and lower respiratory specimens during the acute phase of infection. Negative results do not preclude SARS-CoV-2 infection, do not rule out co-infections with other pathogens, and should not be used as the sole basis for treatment or other patient management decisions. Negative results must be combined with clinical  observations, patient history, and epidemiological information. The expected result is Negative. Fact Sheet for Patients: SugarRoll.be Fact Sheet for Healthcare Providers: https://www.woods-mathews.com/ This test is not yet approved or cleared by the [place] FDA and  has been authorized for detection and/or diagnosis of SARS-CoV-2 by FDA under an Emergency Use Authorization (EUA). This EUA will remain  in effect (meaning this test can be used) for the duration of the COVID-19 declaration under Section 56 4(b)(1) of the Act, 21 U.S.C. section 360bbb-3(b)(1), unless the authorization is terminated or revoked sooner. Performed at [facility], [place] [address], [place], [place] [address]   SARS CORONAVIRUS 2 (TAT 6-24 HRS) Nasopharyngeal Nasopharyngeal Swab  Status: None   Collection Time: 04/28/19 10:04 AM   Specimen: Nasopharyngeal Swab  Result Value Ref Range Status   SARS Coronavirus 2 NEGATIVE NEGATIVE Final    Comment: (NOTE) SARS-CoV-2 target nucleic acids are NOT DETECTED. The SARS-CoV-2 RNA is generally detectable in upper and lower respiratory specimens during the acute phase of infection. Negative results do not preclude SARS-CoV-2 infection, do not rule out co-infections with other pathogens, and should not be used as the sole basis for treatment or other patient management decisions. Negative results must be combined with clinical observations, patient history, and epidemiological information. The expected result is Negative. Fact Sheet for Patients: SugarRoll.be Fact Sheet for Healthcare Providers: https://www.woods-mathews.com/ This test is not yet approved or cleared by the [place] FDA and  has been authorized for detection and/or diagnosis of SARS-CoV-2 by FDA under an Emergency Use Authorization (EUA). This EUA will remain  in effect (meaning this test can be used)  for the duration of the COVID-19 declaration under Section 56 4(b)(1) of the Act, 21 U.S.C. section 360bbb-3(b)(1), unless the authorization is terminated or revoked sooner. Performed at [facility], [place] [address] [place], [place] [address]     Procedures and diagnostic studies:  CT Angio Chest PE W and/or Wo Contrast  Result Date: 04/28/2019 CLINICAL DATA:  59 year old male with acute shortness of breath. EXAM: CT ANGIOGRAPHY CHEST WITH CONTRAST TECHNIQUE: Multidetector CT imaging of the chest was performed using the standard protocol during bolus administration of intravenous contrast. Multiplanar CT image reconstructions and MIPs were obtained to evaluate the vascular anatomy. CONTRAST:  59mL OMNIPAQUE IOHEXOL 350 MG/ML SOLN COMPARISON:  04/28/2019 and prior chest radiographs FINDINGS: Cardiovascular: This is a technically satisfactory study for evaluation of pulmonary emboli. No pulmonary emboli are identified. Cardiomegaly identified. Coronary artery atherosclerotic calcifications are present. There is no evidence of thoracic aortic aneurysm or pericardial effusion. Mediastinum/Nodes: Mildly enlarged mediastinal/bilateral hilar lymph nodes are noted with index nodes as follows: A 1.7 cm RIGHT paratracheal node (series 5: Image 39) A 1.5 cm RIGHT hilar node (5:56) A 1.7 x 2.7 cm subcarinal node (5:61). No enlarged axillary lymph nodes are identified. The visualized thyroid gland and esophagus are unremarkable. Lungs/Pleura: Mild central and LOWER lung ground-glass opacities are noted. No evidence of airspace disease, mass or pneumothorax. Small bilateral pleural effusions are present. Upper Abdomen: No acute abnormality. No enlarged UPPER abdominal lymph nodes are identified. Musculoskeletal: No acute or suspicious bony abnormality. Review of the MIP images confirms the above findings. IMPRESSION: 1. No evidence of pulmonary emboli or thoracic aortic aneurysm. 2. Cardiomegaly, small  bilateral pleural effusions and mild central and LOWER lung ground-glass opacities suggestive of pulmonary edema. Superimposed infection is not excluded. 3. Mildly enlarged mediastinal/bilateral hilar lymph nodes - likely reactive given other thoracic findings. Consider chest CT follow-up in 3-6 months. 4. Coronary artery disease. Electronically Signed   By: Margarette Canada M.D.   On: 04/28/2019 09:44   DG Chest Portable 1 View  Result Date: 04/28/2019 CLINICAL DATA:  Shortness of breath EXAM: PORTABLE CHEST 1 VIEW COMPARISON:  04/27/2019 FINDINGS: Mild bilateral lower lobe opacities, atelectasis versus pneumonia, similar. When compared to the prior, pulmonary vascular congestion is improved. No definite pleural effusions. No pneumothorax. The heart is normal in size. IMPRESSION: Mild bilateral lower lobe opacities, atelectasis versus pneumonia. Additional pulmonary vascular congestion is improved. Electronically Signed   By: Julian Hy M.D.   On: 04/28/2019 06:32   ECHOCARDIOGRAM COMPLETE  Result Date: 04/28/2019  ECHOCARDIOGRAM REPORT   Patient Name:   SEBASTION JUN Date of Exam: 04/28/2019 Medical Rec #:  [id number]      Height:       71.0 in Accession #:    [id number]     Weight:       228.8 lb Date of Birth:  1961-01-13      BSA:          2.233 m Patient Age:    29 years       BP:           147/104 mmHg Patient Gender: M              HR:           69 bpm. Exam Location:  Inpatient Procedure: 2D Echo Indications:    acute diastolic chf 025.42  History:        Patient has prior history of Echocardiogram examinations, most                 recent 01/19/2012. CAD, chronic kidney disease; Risk                 Factors:Dyslipidemia, Hypertension and Sleep Apnea.  Sonographer:    Johny Chess Referring Phys: [id number] [place]  1. Left ventricular ejection fraction, by estimation, is 50 to 55%. The left ventricle has low normal function. The left ventricle demonstrates regional wall motion  abnormalities (see scoring diagram/findings for description). There is mild left ventricular hypertrophy. Left ventricular diastolic parameters are indeterminate.  2. Right ventricular systolic function is normal. The right ventricular size is normal. Tricuspid regurgitation signal is inadequate for assessing PA pressure.  3. The mitral valve is normal in structure. No evidence of mitral valve regurgitation. No evidence of mitral stenosis.  4. The aortic valve is normal in structure. Aortic valve regurgitation is trivial. No aortic stenosis is present.  5. The inferior vena cava is normal in size with greater than 50% respiratory variability, suggesting right atrial pressure of 3 mmHg. FINDINGS  Left Ventricle: Left ventricular ejection fraction, by estimation, is 50 to 55%. The left ventricle has low normal function. The left ventricle demonstrates regional wall motion abnormalities. The left ventricular internal cavity size was normal in size. There is mild left ventricular hypertrophy. Left ventricular diastolic parameters are indeterminate.  LV Wall Scoring: The entire inferior wall is hypokinetic. Right Ventricle: The right ventricular size is normal. No increase in right ventricular wall thickness. Right ventricular systolic function is normal. Tricuspid regurgitation signal is inadequate for assessing PA pressure. Left Atrium: Left atrial size was normal in size. Right Atrium: Right atrial size was normal in size. Pericardium: There is no evidence of pericardial effusion. Mitral Valve: The mitral valve is normal in structure. Normal mobility of the mitral valve leaflets. No evidence of mitral valve regurgitation. No evidence of mitral valve stenosis. Tricuspid Valve: The tricuspid valve is normal in structure. Tricuspid valve regurgitation is not demonstrated. No evidence of tricuspid stenosis. Aortic Valve: The aortic valve is normal in structure. Aortic valve regurgitation is trivial. No aortic stenosis is  present. Pulmonic Valve: The pulmonic valve was normal in structure. Pulmonic valve regurgitation is not visualized. No evidence of pulmonic stenosis. Aorta: The aortic root is normal in size and structure. Venous: The inferior vena cava is normal in size with greater than 50% respiratory variability, suggesting right atrial pressure of 3 mmHg. IAS/Shunts: No atrial level shunt detected by color flow Doppler.  LEFT VENTRICLE PLAX  2D LVIDd:         4.60 cm  Diastology LVIDs:         3.90 cm  LV e' lateral:   3.59 cm/s LV PW:         1.50 cm  LV E/e' lateral: 11.1 LV IVS:        1.00 cm  LV e' medial:    3.81 cm/s LVOT diam:     2.20 cm  LV E/e' medial:  10.5 LV SV:         49 LV SV Index:   22 LVOT Area:     3.80 cm  RIGHT VENTRICLE RV S prime:     13.80 cm/s TAPSE (M-mode): 1.8 cm LEFT ATRIUM             Index       RIGHT ATRIUM           Index LA diam:        4.30 cm 1.93 cm/m  RA Area:     13.70 cm LA Vol (A2C):   63.6 ml 28.49 ml/m RA Volume:   34.20 ml  15.32 ml/m LA Vol (A4C):   49.6 ml 22.22 ml/m LA Biplane Vol: 55.8 ml 24.99 ml/m  AORTIC VALVE LVOT Vmax:   65.80 cm/s LVOT Vmean:  40.100 cm/s LVOT VTI:    0.130 m  AORTA Ao Root diam: 3.20 cm MV E velocity: 39.90 cm/s MV A velocity: 60.70 cm/s  SHUNTS MV E/A ratio:  0.66        Systemic VTI:  0.13 m                            Systemic Diam: 2.20 cm Cherlynn Kaiser MD Electronically signed by Cherlynn Kaiser MD Signature Date/Time: 04/28/2019/6:56:07 PM    Final     Medications:   . amLODipine  10 mg Oral Daily  . aspirin EC  81 mg Oral Daily  . atorvastatin  10 mg Oral q1800  . furosemide  40 mg Intravenous Q12H  . heparin  5,000 Units Subcutaneous Q8H  . hydrALAZINE  50 mg Oral TID  . insulin aspart  0-15 Units Subcutaneous TID WC  . insulin glargine  40 Units Subcutaneous QHS  . isosorbide mononitrate  120 mg Oral Daily  . lisinopril  40 mg Oral Daily  . metoprolol succinate  100 mg Oral Daily  . pantoprazole  40 mg Oral Daily  . sodium  chloride flush  3 mL Intravenous Q12H   Continuous Infusions: . sodium chloride       LOS: 0 days   Geradine Girt  [facility]   How to contact the [facility] Attending or Consulting provider Esmeralda or covering provider during after hours [place], for this patient?  1. Check the care team in [facility] and look for a) attending/consulting TR[facility] provider listed and b) the [facility] team listed 2. Log into [email or web address] and use [place]'s universal password to access. If you do not have the password, please contact the hospital operator. 3. Locate the [facility] provider you are looking for under [facility] and page to a number that you can be directly reached. 4. If you still have difficulty reaching the provider, please page the [facility] (Director on Call) for the Hospitalists listed on amion for assistance.  04/29/2019, 11:28 AM

## 2019-04-29 NOTE — Progress Notes (Signed)
Pt refuses cpap d/t not wearing at home. RT will monitor.

## 2019-04-30 ENCOUNTER — Encounter (HOSPITAL_COMMUNITY): Payer: Self-pay | Admitting: Internal Medicine

## 2019-04-30 DIAGNOSIS — E1122 Type 2 diabetes mellitus with diabetic chronic kidney disease: Secondary | ICD-10-CM

## 2019-04-30 DIAGNOSIS — N1832 Chronic kidney disease, stage 3b: Secondary | ICD-10-CM

## 2019-04-30 DIAGNOSIS — I10 Essential (primary) hypertension: Secondary | ICD-10-CM

## 2019-04-30 DIAGNOSIS — N183 Chronic kidney disease, stage 3 unspecified: Secondary | ICD-10-CM

## 2019-04-30 DIAGNOSIS — E1121 Type 2 diabetes mellitus with diabetic nephropathy: Secondary | ICD-10-CM

## 2019-04-30 DIAGNOSIS — E669 Obesity, unspecified: Secondary | ICD-10-CM

## 2019-04-30 DIAGNOSIS — I5043 Acute on chronic combined systolic (congestive) and diastolic (congestive) heart failure: Secondary | ICD-10-CM

## 2019-04-30 LAB — GLUCOSE, CAPILLARY
Glucose-Capillary: 63 mg/dL — ABNORMAL LOW (ref 70–99)
Glucose-Capillary: 75 mg/dL (ref 70–99)
Glucose-Capillary: 139 mg/dL — ABNORMAL HIGH (ref 70–99)
Glucose-Capillary: 154 mg/dL — ABNORMAL HIGH (ref 70–99)
Glucose-Capillary: 120 mg/dL — ABNORMAL HIGH (ref 70–99)

## 2019-04-30 LAB — BASIC METABOLIC PANEL
Anion gap: 14 (ref 5–15)
BUN: 24 mg/dL — ABNORMAL HIGH (ref 6–20)
CO2: 27 mmol/L (ref 22–32)
Calcium: 8.8 mg/dL — ABNORMAL LOW (ref 8.9–10.3)
Chloride: 102 mmol/L (ref 98–111)
Creatinine, Ser: 2.38 mg/dL — ABNORMAL HIGH (ref 0.61–1.24)
GFR calc Af Amer: 33 mL/min — ABNORMAL LOW (ref >60)
GFR calc non Af Amer: 29 mL/min — ABNORMAL LOW (ref >60)
Glucose, Bld: 68 mg/dL — ABNORMAL LOW (ref 70–99)
Potassium: 2.9 mmol/L — ABNORMAL LOW (ref 3.5–5.1)
Sodium: 143 mmol/L (ref 135–145)

## 2019-04-30 MED ORDER — POTASSIUM CHLORIDE CRYS ER 20 MEQ PO TBCR
40.0000 meq | EXTENDED_RELEASE_TABLET | Freq: Two times a day (BID) | ORAL | Status: DC
  Administered 2019-04-30: 13:00:00 40 meq via ORAL
  Filled 2019-04-30 (×2): qty 2

## 2019-04-30 MED ORDER — POTASSIUM CHLORIDE CRYS ER 20 MEQ PO TBCR
40.0000 meq | EXTENDED_RELEASE_TABLET | Freq: Two times a day (BID) | ORAL | Status: DC
  Administered 2019-04-30 – 2019-05-01 (×3): 40 meq via ORAL
  Filled 2019-04-30 (×3): qty 2

## 2019-04-30 MED ORDER — INSULIN GLARGINE 100 UNIT/ML ~~LOC~~ SOLN
37.0000 [IU] | Freq: Every day | SUBCUTANEOUS | Status: DC
  Administered 2019-04-30: 37 [IU] via SUBCUTANEOUS
  Filled 2019-04-30 (×2): qty 0.37

## 2019-04-30 MED ORDER — MAGNESIUM OXIDE 400 (241.3 MG) MG PO TABS
400.0000 mg | ORAL_TABLET | Freq: Every day | ORAL | Status: DC
  Administered 2019-04-30 – 2019-05-01 (×2): 400 mg via ORAL
  Filled 2019-04-30 (×2): qty 1

## 2019-04-30 NOTE — Progress Notes (Signed)
Patient refused CPAP tonight 

## 2019-04-30 NOTE — TOC Initial Note (Signed)
Transition of Care [facility]) - Initial/Assessment Note    Patient Details  Name: Roberto Allen MRN: [id number] Date of Birth: [birth year]  Transition of Care [facility]) CM/SW Contact:    Zenon Mayo, RN Phone Number: 04/30/2019, 1:19 PM  Clinical Narrative:                 From home with wife.  PTA Indep.  He states he weighs himself daily, he takes his medications like he is supposed to. He states he will try to eat a better diet without sodium, more baked and fresh foods.  He will have transportation at dc and he has no issues with getting his medications.  He states he would rather go as a walk in to his PCP office instead of making an apt because he can get seen faster.  NCM informed him to go as walk in within 7 days from discharged.  NCM asked if he would let to be set up with a [facility] for CHF disease management , he states no .  TOC team will follow for dc needs.   Expected Discharge Plan: Home/Self Care Barriers to Discharge: Continued Medical Work up   Patient Goals and CMS Choice Patient states their goals for this hospitalization and ongoing recovery are:: get better CMS Medicare.gov Compare Post Acute Care list provided to:: Patient Choice offered to / list presented to : Patient  Expected Discharge Plan and Services Expected Discharge Plan: Home/Self Care In-house Referral: NA Discharge Planning Services: CM Consult   Living arrangements for the past 2 months: Single Family Home                 DME Arranged: (NA)         HH Arranged: Refused HH          Prior Living Arrangements/Services Living arrangements for the past 2 months: Single Family Home Lives with:: Spouse Patient language and need for interpreter reviewed:: Yes Do you feel safe going back to the place where you live?: Yes      Need for Family Participation in Patient Care: Yes (Comment) Care giver support system in place?: Yes (comment)   Criminal Activity/Legal Involvement Pertinent to  Current Situation/Hospitalization: No - Comment as needed  Activities of Daily Living Home Assistive Devices/Equipment: CBG Meter, Eyeglasses ADL Screening (condition at time of admission) Patient's cognitive ability adequate to safely complete daily activities?: Yes Is the patient deaf or have difficulty hearing?: No Does the patient have difficulty seeing, even when wearing glasses/contacts?: No Does the patient have difficulty concentrating, remembering, or making decisions?: No Patient able to express need for assistance with ADLs?: No Does the patient have difficulty dressing or bathing?: No Independently performs ADLs?: Yes (appropriate for developmental age) Does the patient have difficulty walking or climbing stairs?: Yes Weakness of Legs: None Weakness of Arms/Hands: None  Permission Sought/Granted                  Emotional Assessment Appearance:: Appears stated age Attitude/Demeanor/Rapport: Engaged Affect (typically observed): Appropriate Orientation: : Oriented to Self, Oriented to Place, Oriented to  Time, Oriented to Situation Alcohol / Substance Use: Not Applicable Psych Involvement: No (comment)  Admission diagnosis:  CHF (congestive heart failure) ([place]) [I50.9] Acute on chronic congestive heart failure, unspecified heart failure type [facility]) [I50.9] Patient Active Problem List   Diagnosis Date Noted  . CHF (congestive heart failure) ([place]) 04/28/2019  . Hypertension associated with stage 4 chronic kidney disease due to type 2 diabetes  mellitus ([place]) 08/30/2017  . Hypertension secondary to other renal disorders 11/29/2016  . GERD (gastroesophageal reflux disease) 07/23/2016  . Degenerative disc disease at L5-S1 level 04/09/2016  . Primary osteoarthritis of right hip 04/09/2016  . Obesity, Class I, BMI 30-34.9 02/06/2016  . Ulcer of left foot ([place]) 01/06/2015  . Microalbuminuria due to type 2 diabetes mellitus ([place]) 10/03/2014  . 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  diabetes mellitus ([place]) 06/26/2014  . NICM (nonischemic cardiomyopathy) ([place]) 09/06/2013  . ED (erectile dysfunction) 03/27/2013  . Acute on chronic renal insufficiency 12/17/2012  . Hypertriglyceridemia 12/17/2012  . Disorder of kidney and ureter 12/17/2012  . Precordial chest pain 12/16/2012  . Hyperkalemia 12/16/2012  . Diabetes mellitus ([place]) 12/16/2012  . Coronary Artery Disease (non-obstructive by cath 05/2011) 06/14/2011  . Hyperlipidemia 06/14/2011  . OSA (obstructive sleep apnea) 05/25/2011  . Chronic systolic heart failure ([place]) 05/11/2011  . Heart failure ([place]) 04/27/2011  . Hypertension   . SOB (shortness of breath)    PCP:  [name], MD Pharmacy:   [id number] - [place], [place] [address] [place] [address] Phone: [phone] Fax: [phone]     Social Determinants of Health (SDOH) Interventions    Readmission Risk Interventions Readmission Risk Prevention Plan 04/30/2019  Transportation Screening Complete  PCP or Specialist Appt within 3-5 Days Complete  HRI or [place] Complete  Social Work Consult for [place] Planning/Counseling Complete  Palliative Care Screening Not Applicable  Medication Review Press photographer) Complete  Some recent data might be hidden

## 2019-04-30 NOTE — Progress Notes (Addendum)
Progress Note    Roberto Allen  TJQ:[id number] DOB: 1961/01/20  DOA: 04/28/2019 PCP: [name], MD    Brief Narrative:     Medical records reviewed and are as summarized below:  Roberto Allen is an 59 y.o. male who presented from home with shortness of breath.  Patient states that over the last month he has gained about 15 pounds and had increased leg swelling.  Patient prior followed with cardiology but he states he was released from their service as he no longer needed to follow-up.  I cannot find any notes in the chart to substantiate this.   Assessment/Plan:   Active Problems:   Hypertension   Diabetes mellitus ([place])   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 diabetes mellitus (HCC)   Obesity, Class I, BMI 30-34.9   CHF (congestive heart failure) (HCC)    Acute diastolic CHF decompensation Likely from Uncontrolled HTN.  -His symptoms  improved after a dose of IV Lasix in ED -given Lasix BID yesterday with good diuresis but now Cr trending up Echo :Left ventricular ejection fraction, by estimation, is 50 to 55%. The  left ventricle has low normal function. The left ventricle demonstrates  regional wall motion abnormalities (see scoring diagram/findings for description). There is mild left ventricular hypertrophy. Left ventricular diastolic parameters are indeterminate.  -patient continues to c/o dyspnea and chest discomfort with exertion-- will get cardiology consult-- per patient he had a recent stress test with [place] (his PCP) and it was normal-- will request records today Continue to monitor I's and O's and daily weights: Appears to have diuresed 2.5L  HTN emergency Will monitor blood pressure while getting IV diuresis If blood pressure after diuresis is still high we will titrate up  blood pressure Medications  Elevated Troponin Trop level pattern is flat, not ACS -Suspect demand  IDDM-type II Continue Lantus but at a lower dose Add sliding  scale -Follow-up with outpatient PCP  CKD Stage IIIa  - eGFR 45-59 -Baseline GFR from September 2020 was 48  Combined HLD Continue Statin and Fibrate  obesity Body mass index is 30.82 kg/m.  Hypokalemia -Replete along with magnesium   Family Communication/Anticipated D/C date and plan/Code Status   DVT prophylaxis: Heparin subcu Code Status: Full Code.  Family Communication: wife at bedside Disposition Plan: due to Cr trending up and continues dyspnea/chest discomfort with exertion will get cardiology consult  Medical Consultants:    cards     Subjective:   Having some SOB and chest discomfort when moving but improved at rest  Objective:    Vitals:   04/30/19 0441 04/30/19 0500 04/30/19 0749 04/30/19 1004  BP: (!) 107/53  102/69 130/67  Pulse: 72  75 71  Resp: _0 Temp: 98.9 F (37.2 C)  99.2 F (37.3 C) 98 F (36.7 C)  TempSrc: Oral  Oral Oral  SpO2: 95%  96% 100%  Weight:  100.2 kg    Height:        Intake/Output Summary (Last 24 hours) at 04/30/2019 1118 Last data filed at 04/30/2019 1003 Gross per 24 hour  Intake 480 ml  Output 1700 ml  Net -1220 ml   Filed Weights   04/28/19 1629 04/29/19 0527 04/30/19 0500  Weight: 103.9 kg 100.3 kg 100.2 kg    Exam: In bed, NAD rrr Diminished breath sounds, no wheezing, faint crackles at bases No LE edema A+Ox3  Data Reviewed:   I have personally reviewed following labs  and imaging studies:  Labs: Labs show the following:   Basic Metabolic Panel: Recent Labs  Lab 04/27/19 0950 04/27/19 0950 04/28/19 0616 04/28/19 0616 04/29/19 0516 04/30/19 0535  NA 139  --  139  --  140 143  K 3.3*   < > 3.7   < > 3.1* 2.9*  CL 102  --  104  --  98 102  CO2 28  --  23  --  28 27  GLUCOSE 134*  --  227*  --  163* 68*  BUN 14  --  14  --  15 24*  CREATININE 1.61*  --  1.70*  --  1.95* 2.38*  CALCIUM 8.9  --  8.9  --  8.9 8.8*  MG  --   --   --   --  1.7  --    < > = values in this interval  not displayed.   GFR Estimated Creatinine Clearance: 39.8 mL/min (A) (by C-G formula based on SCr of 2.38 mg/dL (H)). Liver Function Tests: Recent Labs  Lab 04/27/19 0950  AST 19  ALT 19  ALKPHOS 108  BILITOT 0.8  PROT 6.4*  ALBUMIN 3.2*   No results for input(s): LIPASE, AMYLASE in the last 168 hours. No results for input(s): AMMONIA in the last 168 hours. Coagulation profile No results for input(s): INR, PROTIME in the last 168 hours.  CBC: Recent Labs  Lab 04/27/19 0950 04/28/19 0616  WBC 9.2 8.4  NEUTROABS 6.6  --   HGB 13.3 13.1  HCT 39.9 39.4  MCV 91.1 92.5  PLT 324 331   Cardiac Enzymes: No results for input(s): CKTOTAL, CKMB, CKMBINDEX, TROPONINI in the last 168 hours. BNP (last 3 results) No results for input(s): PROBNP in the last 8760 hours. CBG: Recent Labs  Lab 04/29/19 1201 04/29/19 1647 04/29/19 2138 04/30/19 0602 04/30/19 0633  GLUCAP 85 137* 160* 63* 75   D-Dimer: Recent Labs    04/28/19 0748  DDIMER 1.40*   Hgb A1c: No results for input(s): HGBA1C in the last 72 hours. Lipid Profile: No results for input(s): CHOL, HDL, LDLCALC, TRIG, CHOLHDL, LDLDIRECT in the last 72 hours. Thyroid function studies: Recent Labs    04/28/19 1506  TSH 2.650   Anemia work up: No results for input(s): VITAMINB12, FOLATE, FERRITIN, TIBC, IRON, RETICCTPCT in the last 72 hours. Sepsis Labs: Recent Labs  Lab 04/27/19 0950 04/28/19 0616  WBC 9.2 8.4    Microbiology Recent Results (from the past 240 hour(s))  SARS CORONAVIRUS 2 (TAT 6-24 HRS) Nasopharyngeal Nasopharyngeal Swab     Status: None   Collection Time: 04/27/19  9:49 AM   Specimen: Nasopharyngeal Swab  Result Value Ref Range Status   SARS Coronavirus 2 NEGATIVE NEGATIVE Final    Comment: (NOTE) SARS-CoV-2 target nucleic acids are NOT DETECTED. The SARS-CoV-2 RNA is generally detectable in upper and lower respiratory specimens during the acute phase of infection. Negative results do  not preclude SARS-CoV-2 infection, do not rule out co-infections with other pathogens, and should not be used as the sole basis for treatment or other patient management decisions. Negative results must be combined with clinical observations, patient history, and epidemiological information. The expected result is Negative. Fact Sheet for Patients: SugarRoll.be Fact Sheet for Healthcare Providers: https://www.woods-mathews.com/ This test is not yet approved or cleared by the [place] FDA and  has been authorized for detection and/or diagnosis of SARS-CoV-2 by FDA under an Emergency Use Authorization (EUA). This EUA will  remain  in effect (meaning this test can be used) for the duration of the COVID-19 declaration under Section 56 4(b)(1) of the Act, 21 U.S.C. section 360bbb-3(b)(1), unless the authorization is terminated or revoked sooner. Performed at [facility], [place] [address] [place], [place] [address]   SARS CORONAVIRUS 2 (TAT 6-24 HRS) Nasopharyngeal Nasopharyngeal Swab     Status: None   Collection Time: 04/28/19 10:04 AM   Specimen: Nasopharyngeal Swab  Result Value Ref Range Status   SARS Coronavirus 2 NEGATIVE NEGATIVE Final    Comment: (NOTE) SARS-CoV-2 target nucleic acids are NOT DETECTED. The SARS-CoV-2 RNA is generally detectable in upper and lower respiratory specimens during the acute phase of infection. Negative results do not preclude SARS-CoV-2 infection, do not rule out co-infections with other pathogens, and should not be used as the sole basis for treatment or other patient management decisions. Negative results must be combined with clinical observations, patient history, and epidemiological information. The expected result is Negative. Fact Sheet for Patients: SugarRoll.be Fact Sheet for Healthcare Providers: https://www.woods-mathews.com/ This test is not yet  approved or cleared by the [place] FDA and  has been authorized for detection and/or diagnosis of SARS-CoV-2 by FDA under an Emergency Use Authorization (EUA). This EUA will remain  in effect (meaning this test can be used) for the duration of the COVID-19 declaration under Section 56 4(b)(1) of the Act, 21 U.S.C. section 360bbb-3(b)(1), unless the authorization is terminated or revoked sooner. Performed at [facility], [place] [address], [place], [place] [address]     Procedures and diagnostic studies:  ECHOCARDIOGRAM COMPLETE  Result Date: 04/28/2019    ECHOCARDIOGRAM REPORT   Patient Name:   ARGIE APPLEGATE Date of Exam: 04/28/2019 Medical Rec #:  [id number]      Height:       71.0 in Accession #:    [id number]     Weight:       228.8 lb Date of Birth:  [birth year]      BSA:          2.233 m Patient Age:    46 years       BP:           147/104 mmHg Patient Gender: M              HR:           69 bpm. Exam Location:  Inpatient Procedure: 2D Echo Indications:    acute diastolic chf 203.55  History:        Patient has prior history of Echocardiogram examinations, most                 recent 01/19/2012. CAD, chronic kidney disease; Risk                 Factors:Dyslipidemia, Hypertension and Sleep Apnea.  Sonographer:    Johny Chess Referring Phys: [id number] [place]  1. Left ventricular ejection fraction, by estimation, is 50 to 55%. The left ventricle has low normal function. The left ventricle demonstrates regional wall motion abnormalities (see scoring diagram/findings for description). There is mild left ventricular hypertrophy. Left ventricular diastolic parameters are indeterminate.  2. Right ventricular systolic function is normal. The right ventricular size is normal. Tricuspid regurgitation signal is inadequate for assessing PA pressure.  3. The mitral valve is normal in structure. No evidence of mitral valve regurgitation. No evidence of mitral stenosis.  4. The  aortic valve is normal in  structure. Aortic valve regurgitation is trivial. No aortic stenosis is present.  5. The inferior vena cava is normal in size with greater than 50% respiratory variability, suggesting right atrial pressure of 3 mmHg. FINDINGS  Left Ventricle: Left ventricular ejection fraction, by estimation, is 50 to 55%. The left ventricle has low normal function. The left ventricle demonstrates regional wall motion abnormalities. The left ventricular internal cavity size was normal in size. There is mild left ventricular hypertrophy. Left ventricular diastolic parameters are indeterminate.  LV Wall Scoring: The entire inferior wall is hypokinetic. Right Ventricle: The right ventricular size is normal. No increase in right ventricular wall thickness. Right ventricular systolic function is normal. Tricuspid regurgitation signal is inadequate for assessing PA pressure. Left Atrium: Left atrial size was normal in size. Right Atrium: Right atrial size was normal in size. Pericardium: There is no evidence of pericardial effusion. Mitral Valve: The mitral valve is normal in structure. Normal mobility of the mitral valve leaflets. No evidence of mitral valve regurgitation. No evidence of mitral valve stenosis. Tricuspid Valve: The tricuspid valve is normal in structure. Tricuspid valve regurgitation is not demonstrated. No evidence of tricuspid stenosis. Aortic Valve: The aortic valve is normal in structure. Aortic valve regurgitation is trivial. No aortic stenosis is present. Pulmonic Valve: The pulmonic valve was normal in structure. Pulmonic valve regurgitation is not visualized. No evidence of pulmonic stenosis. Aorta: The aortic root is normal in size and structure. Venous: The inferior vena cava is normal in size with greater than 50% respiratory variability, suggesting right atrial pressure of 3 mmHg. IAS/Shunts: No atrial level shunt detected by color flow Doppler.  LEFT VENTRICLE PLAX 2D LVIDd:          4.60 cm  Diastology LVIDs:         3.90 cm  LV e' lateral:   3.59 cm/s LV PW:         1.50 cm  LV E/e' lateral: 11.1 LV IVS:        1.00 cm  LV e' medial:    3.81 cm/s LVOT diam:     2.20 cm  LV E/e' medial:  10.5 LV SV:         49 LV SV Index:   22 LVOT Area:     3.80 cm  RIGHT VENTRICLE RV S prime:     13.80 cm/s TAPSE (M-mode): 1.8 cm LEFT ATRIUM             Index       RIGHT ATRIUM           Index LA diam:        4.30 cm 1.93 cm/m  RA Area:     13.70 cm LA Vol (A2C):   63.6 ml 28.49 ml/m RA Volume:   34.20 ml  15.32 ml/m LA Vol (A4C):   49.6 ml 22.22 ml/m LA Biplane Vol: 55.8 ml 24.99 ml/m  AORTIC VALVE LVOT Vmax:   65.80 cm/s LVOT Vmean:  40.100 cm/s LVOT VTI:    0.130 m  AORTA Ao Root diam: 3.20 cm MV E velocity: 39.90 cm/s MV A velocity: 60.70 cm/s  SHUNTS MV E/A ratio:  0.66        Systemic VTI:  0.13 m                            Systemic Diam: 2.20 cm Cherlynn Kaiser MD Electronically signed by Cherlynn Kaiser MD Signature Date/Time: 04/28/2019/6:56:07  PM    Final     Medications:   . amLODipine  5 mg Oral Daily  . aspirin EC  81 mg Oral Daily  . atorvastatin  10 mg Oral q1800  . heparin  5,000 Units Subcutaneous Q8H  . hydrALAZINE  25 mg Oral TID  . insulin aspart  0-15 Units Subcutaneous TID WC  . insulin glargine  37 Units Subcutaneous QHS  . isosorbide mononitrate  120 mg Oral Daily  . lisinopril  40 mg Oral Daily  . metoprolol succinate  100 mg Oral Daily  . pantoprazole  40 mg Oral Daily  . sodium chloride flush  3 mL Intravenous Q12H   Continuous Infusions: . sodium chloride       LOS: 1 day   Geradine Girt  [facility]   How to contact the [facility] Attending or Consulting provider [place] or covering provider during after hours [place], for this patient?  1. Check the care team in [facility] and look for a) attending/consulting TR[facility] provider listed and b) the [facility] team listed 2. Log into [email or web address] and use [place]'s universal password to access. If you do not have  the password, please contact the hospital operator. 3. Locate the [facility] provider you are looking for under [facility] and page to a number that you can be directly reached. 4. If you still have difficulty reaching the provider, please page the [facility] (Director on Call) for the Hospitalists listed on amion for assistance.  04/30/2019, 11:18 AM

## 2019-04-30 NOTE — Progress Notes (Signed)
Pt CBG 63 at 6am. Gave 8 oz orange juice. Rechecked CBG 75. NP on call paged. Will relay to day team.

## 2019-04-30 NOTE — Progress Notes (Signed)
Inpatient Diabetes Program Recommendations  AACE/ADA: New Consensus Statement on Inpatient Glycemic Control   Target Ranges:  Prepandial:   less than 140 mg/dL      Peak postprandial:   less than 180 mg/dL (1-2 hours)      Critically ill patients:  140 - 180 mg/dL   Results for ARJEN, DERINGER (MRN [id number]) as of 04/30/2019 09:25  Ref. Range 04/29/2019 06:15 04/29/2019 12:01 04/29/2019 16:47 04/29/2019 21:38 04/30/2019 06:02 04/30/2019 06:33  Glucose-Capillary Latest Ref Range: 70 - 99 mg/dL 132 (H) 85 137 (H) 160 (H) 63 (L) 75   Review of Glycemic Control  Diabetes history: DM2 Outpatient Diabetes medications: Lantus 40 units QHS, Ozempic 1 mg Qweek (Saturday) Current orders for Inpatient glycemic control: Lantus 40 units QHS, Novolog 0-15 units TID with meals  Inpatient Diabetes Program Recommendations:   Insulin - Basal: Please consider decreasing Lantus to 37 units QHS.  Thanks, Barnie Alderman, RN, MSN, CDE Diabetes Coordinator Inpatient Diabetes Program [phone] (Team Pager from 8am to 5pm)

## 2019-04-30 NOTE — Consult Note (Addendum)
Cardiology Consultation:   Patient ID: Roberto Allen; [id number]; January 24, 1961   Admit date: 04/28/2019 Date of Consult: 04/30/2019  Primary Care Provider: [name], MD Primary Cardiologist: No primary care provider on file. [facility] saw 2013-2015 Primary Electrophysiologist:  None   Patient Profile:   Roberto Allen is a 59 y.o. male with a hx of non-obs CAD at cath 2013, NICM, S-CHF w/ EF 30-35% 2013>>55-60%, DM, HTN, HLD, GERD, OSA u/a to afford CPAP, who is being seen today for the evaluation of CHF at the request of Dr Eliseo Squires.  History of Present Illness:   Roberto. Allen was last seen by Dr Percival Spanish in 2015, he had run out of Lasix and gained wt, meds restarted and pt to follow up. Diet generally poor.   He was admitted 03/13 w/ increased SOB, wt gain and CP.  Roberto Youman has not been weighing himself daily but he can.   He has been married for 1-1/2 years, his wife feels that they have both gained weight since marriage. She does the cooking and says can do low Na+ foods.    His wife noted him to have cankles about a week ago. He has been noticing increased DOE x 2-3 weeks, could not do the walking he was normally able to do when they went on a weekend trip. He also describes PND, orthopnea. He also has bendopnea.  He was taking Lasix 20 mg qd, compliant w/ this. He came in because the SOB was getting worse, he was having trouble working. Could not carry anything for any distance.   Since admission, his LE edema has resolved. He is breathing much better, but not back to baseline.  He got chest tightness yesterday. It reached an 3-4/10, it was on the R side of his chest. It happened after he was in the hospital. He drank some water and the pain went away. It lasted about 15".  He has never had this before.   He has to walk a great deal in his job. He has to climb ladders to get parts and carries batteries and other heavy things. He has never had CP w/ exertion. Until this event  started, no dyspnea w/ exertion either.    Past Medical History:  Diagnosis Date  . CAD (coronary artery disease)    a. LHC 4/13: mD1 25%, oD2 25%, pOM1 25%, pRCA 25%, dRCA 40%, EF 30%  . Chronic systolic heart failure (HCC)    a. EF 30-35% in 04/2011, improved to 55-60% in 01/2012.  Marland Kitchen CKD (chronic kidney disease), stage II   . COPD (chronic obstructive pulmonary disease) ([place])   . Diabetes mellitus   . GERD (gastroesophageal reflux disease)   . History of pneumonia   . Hx of cardiovascular stress test    a. Nuclear (12/2012):  No ischemia, EF 59%  . Hyperkalemia    a. 12/2012 - spironolactone, KCl supp discontinued.  . Hyperlipidemia   . Hypertension   . Hypertriglyceridemia    a. 12/2012 - started on fenofibrate.  . Knee pain   . NICM (nonischemic cardiomyopathy) ([place])    a. Echo 3/13: mild LVH, EF 30-35%, grade 3 diast dysfxn, mod LAE;  RHC 4/13:  RA 8, RV 58/10, PA 56/18, mean 35, PCWP mean 17, LV 154/27, CO 4.3, CI 2.0. b. EF Improved >>> Echo (01/2012):  Mild LVH, EF 55-60%, no RWMA, Gr 1 DD    Past Surgical History:  Procedure Laterality Date  . CARDIAC CATHETERIZATION  2013  non-obs dz 25-40% multiple vessels  . TENOTOMY  2018     Prior to Admission medications   Medication Sig Start Date End Date Taking? Authorizing Provider  atorvastatin (LIPITOR) 10 MG tablet Take 10 mg by mouth every morning.  03/31/18  Yes [provider]  furosemide (LASIX) 20 MG tablet Take 20 mg by mouth daily.   Yes [provider]  hydrALAZINE (APRESOLINE) 25 MG tablet Take 25 mg by mouth 3 (three) times daily.  06/22/16  Yes [provider]  LANTUS 100 UNIT/ML injection Inject 40 Units into the skin at bedtime.  06/22/13  Yes [provider]  lisinopril (PRINIVIL,ZESTRIL) 40 MG tablet Take 40 mg by mouth at bedtime.  06/22/16  Yes [provider]  metoprolol succinate (TOPROL-XL) 100 MG 24 hr tablet Take 100 mg by mouth daily.  06/22/16  Yes [provider]  omeprazole (PRILOSEC) 20 MG capsule Take 20 mg by mouth daily.  05/09/16  Yes [provider]  OZEMPIC 1 MG/DOSE SOPN Inject 1 mg into the skin every Saturday.  09/25/16  Yes [provider]  ACCU-CHEK FASTCLIX LANCETS MISC USE TO MONITOR BLOOD GLUCOSE 3 TIME(S) DAILY 05/05/17   [provider]  BD PEN NEEDLE NANO U/F 32G X 4 MM MISC 1 EACH BY DOES NOT APPLY ROUTE 2 (TWO) TIMES DAILY. 05/18/16   [provider]  benzonatate (TESSALON) 100 MG capsule Take 1-2 capsules (100-200 mg total) by mouth 3 (three) times daily as needed. Patient not taking: Reported on 04/28/2019 03/28/19   [name], PA-C  Blood Glucose Monitoring Suppl (ACCU-CHEK GUIDE) w/Device KIT 3 TIMES A DAY. USE AS DIRECTED BY PHYSICIAN TO MONITOR BLOOD SUGARS 04/05/17   [provider]  fenofibrate (TRICOR) 48 MG tablet Take 1 tablet (48 mg total) by mouth daily. Patient not taking: Reported on 04/28/2019 12/17/12   [name], PA-C  furosemide (LASIX) 40 MG tablet Take 1 tablet (40 mg total) by mouth daily. Patient not taking: Reported on 04/28/2019 09/06/13   [name], PA-C  glucose blood [facility] VERIO) test strip 4-5 TIMES PER DAY 05/17/16   [provider]  guaiFENesin (ROBITUSSIN) 100 MG/5ML liquid Take 5-10 mLs (100-200 mg total) by mouth every 4 (four) hours as needed for cough. Patient not taking: Reported on 04/28/2019 04/27/19   [name], NP  HYDROcodone-acetaminophen (NORCO/VICODIN) 5-325 MG tablet Take 1 tablet by mouth every 6 (six) hours as needed. Patient not taking: Reported on 04/28/2019 06/14/18   [name], DPM  isosorbide mononitrate (IMDUR) 120 MG 24 hr tablet TAKE 1 TABLET (120 MG TOTAL) BY MOUTH DAILY. Patient not taking: No sig reported 02/21/13   [name], MD  promethazine (PHENERGAN) 25 MG tablet Take 1 tablet (25 mg total) by mouth every 8 (eight) hours as needed for nausea or vomiting. Patient not taking: Reported on  04/28/2019 06/14/18   [name], DPM  promethazine-dextromethorphan (PROMETHAZINE-DM) 6.25-15 MG/5ML syrup Take 5 mLs by mouth at bedtime as needed for cough. Patient not taking: Reported on 04/28/2019 03/28/19   [name], PA-C  silver sulfADIAZINE (SILVADENE) 1 % cream Apply 1 application topically daily. Patient not taking: Reported on 04/28/2019 07/16/16   [name], DPM  silver sulfADIAZINE (SILVADENE) 1 % cream Apply 1 application topically daily. Patient not taking: Reported on 04/28/2019 01/16/18   [name], DPM  traZODone (DESYREL) 50 MG tablet Take 0.5-1 tablets (25-50 mg total) by mouth at bedtime as  needed for sleep. Patient not taking: Reported on 04/28/2019 08/20/17   Shelda Pal, DO  potassium chloride (KLOR-CON) 20 MEQ packet Take 20 mEq by mouth 2 (two) times daily.  04/23/11  [provider]    Inpatient Medications: Scheduled Meds: . amLODipine  5 mg Oral Daily  . aspirin EC  81 mg Oral Daily  . atorvastatin  10 mg Oral q1800  . heparin  5,000 Units Subcutaneous Q8H  . hydrALAZINE  25 mg Oral TID  . insulin aspart  0-15 Units Subcutaneous TID WC  . insulin glargine  37 Units Subcutaneous QHS  . isosorbide mononitrate  120 mg Oral Daily  . lisinopril  40 mg Oral Daily  . metoprolol succinate  100 mg Oral Daily  . pantoprazole  40 mg Oral Daily  . sodium chloride flush  3 mL Intravenous Q12H   Continuous Infusions: . sodium chloride     PRN Meds: sodium chloride, acetaminophen, ondansetron (ZOFRAN) IV, sodium chloride flush, traZODone  Allergies:    Allergies  Allergen Reactions  . Metformin And Related Other (See Comments)    Due to CHF and CRI    Social History:   Social History   Socioeconomic History  . Marital status: Married    Spouse name: Not on file  . Number of children: 6  . Years of education: Not on file  . Highest education level: Not on file  Occupational History  . Occupation: [employer]     CommentInsurance claims handler: [employer]  Tobacco Use  . Smoking status: Former Smoker    Packs/day: 1.00    Years: 20.00    Pack years: 20.00    Types: Cigarettes    Quit date: 09/15/2006    Years since quitting: 12.6  . Smokeless tobacco: Never Used  Substance and Sexual Activity  . Alcohol use: Yes    Comment: occ  . Drug use: No  . Sexual activity: Yes  Other Topics Concern  . Not on file  Social History Narrative   Lives with wife.   Social Determinants of Health   Financial Resource Strain:   . Difficulty of Paying Living Expenses:   Food Insecurity:   . Worried About Charity fundraiser in the Last Year:   . Arboriculturist in the Last Year:   Transportation Needs:   . Film/video editor (Medical):   Marland Kitchen Lack of Transportation (Non-Medical):   Physical Activity:   . Days of Exercise per Week:   . Minutes of Exercise per Session:   Stress:   . Feeling of Stress :   Social Connections:   . Frequency of Communication with Friends and Family:   . Frequency of Social Gatherings with Friends and Family:   . Attends Religious Services:   . Active Member of Clubs or Organizations:   . Attends Archivist Meetings:   Marland Kitchen Marital Status:   Intimate Partner Violence:   . Fear of Current or Ex-Partner:   . Emotionally Abused:   Marland Kitchen Physically Abused:   . Sexually Abused:     Family History:   Family History  Problem Relation Age of Onset  . Diabetes Sister   . Breast cancer Mother   . Breast cancer Maternal Grandmother    Family Status:  Family Status  Relation Name Status  . Sister  (Not Specified)  . Mother  Deceased  . MGM  Deceased    ROS:  Please  see the history of present illness.  All other ROS reviewed and negative.     Physical Exam/Data:   Vitals:   04/30/19 0500 04/30/19 0749 04/30/19 1004 04/30/19 1126  BP:  102/69 130/67 115/70  Pulse:  75 71 69  Resp:  _0 Temp:  99.2 F (37.3 C) 98 F (36.7 C) 97.9 F (36.6  C)  TempSrc:  Oral Oral Oral  SpO2:  96% 100% 97%  Weight: 100.2 kg     Height:        Intake/Output Summary (Last 24 hours) at 04/30/2019 1145 Last data filed at 04/30/2019 1003 Gross per 24 hour  Intake 480 ml  Output 1700 ml  Net -1220 ml    Last 3 Weights 04/30/2019 04/29/2019 04/28/2019  Weight (lbs) 221 lb 221 lb 3.2 oz 229 lb 0.9 oz  Weight (kg) 100.245 kg 100.336 kg 103.9 kg     Body mass index is 30.82 kg/m.   General:  Well nourished, well developed, male in no acute distress HEENT: normal Lymph: no adenopathy Neck: JVD - 10 cm Endocrine:  No thryomegaly Vascular: No carotid bruits; 4/4 extremity pulses 2+  Cardiac:  normal S1, S2; RRR; no murmur Lungs:  Decreased BS bases w/ some rales bilaterally, no wheezing, rhonchi   Abd: soft, nontender, no hepatomegaly  Ext: no edema Musculoskeletal:  No deformities, BUE and BLE strength normal and equal Skin: warm and dry  Neuro:  CNs 2-12 intact, no focal abnormalities noted Psych:  Normal affect   EKG:  The EKG was personally reviewed and demonstrates:  03/13 ECG is SR, HR 71, mild 1st degree AVB (old) Telemetry:  Telemetry was personally reviewed and demonstrates:  SR, 6 bt run NSVT   CV studies:   ECHO: 04/28/2019 1. Left ventricular ejection fraction, by estimation, is 50 to 55%. The  left ventricle has low normal function. The left ventricle demonstrates  regional wall motion abnormalities (see scoring diagram/findings for  description). Inferior wall hypokinesis. There is mild left  ventricular hypertrophy. Left ventricular diastolic parameters are  indeterminate.  2. Right ventricular systolic function is normal. The right ventricular  size is normal. Tricuspid regurgitation signal is inadequate for assessing  PA pressure.  3. The mitral valve is normal in structure. No evidence of mitral valve  regurgitation. No evidence of mitral stenosis.  4. The aortic valve is normal in structure. Aortic valve  regurgitation is  trivial. No aortic stenosis is present.  5. The inferior vena cava is normal in size with greater than 50%  respiratory variability, suggesting right atrial pressure of 3 mmHg.   CATH: 05/28/2011 Procedural Findings: Hemodynamics:                                     RA 8                                     RV 58/10                                     PA 56/18  Mean 35  PCWP Mean 17                                     LV 154/27                                     AO 151/91              Oxygen saturations:                                     PA 63                                     AO 95              Cardiac Output (Fick) 4.3                               Cardiac Index (Fick) 2.0               Coronary angiography:  Coronary dominance: right  Left mainstem: Normal  Left anterior descending (LAD): The LAD wrapped the apex. There were no high grade lesions. There was diffuse luminal irregularity. The first diagonal was moderate size with 25% mid stenosis. Second diagonal is moderate size with ostial 25% stenosis.  Left circumflex (LCx): AV groove moderate-sized with luminal irregularities. Ramus intermediate moderate sized and normal. First obtuse marginal large with proximal 25% stenosis. Second and third obtuse marginal small and normal. Posterior lateral small and normal.  Right coronary artery (RCA): Dominant. Long proximal 25% stenosis. Distal 40% stenosis before the PDA. PDA was small to moderate size no high grade  Left ventriculography: Left ventricular systolic function is normal, LVEF is estimated at 30%, there is no significant mitral regurgitation   Final Conclusions:  Nonobstructive coronary disease. Moderately severe global left ventricular dysfunction. He has mild to moderately elevated pulmonary pressures.  Nonobstructive coronary disease.Recommendations:   Continued medical management.  SLEEP  STUDY: 07/13/2011 The patient has mild obstructive sleep apnea by his recent sleep study, but is having significant sleep disruption and daytime sleepiness with inactivity.  Even this is not a significant risk to his cardiovascular health, it seems to be having a big impact on his quality of life.  I have outlined various treatment options for his mild sleep apnea, but feel that a trial of CPAP is easy to start and also discontinue.  He is willing to give this a try. I will set the patient up on cpap at a moderate pressure level to allow for desensitization, and will troubleshoot the device over the next 4-6weeks if needed.  The pt is to call me if having issues with tolerance.  Will then optimize the pressure once patient is able to wear cpap on a consistent basis.   Laboratory Data:   Chemistry Recent Labs  Lab 04/28/19 0616 04/29/19 0516 04/30/19 0535  NA 139 140 143  K 3.7 3.1* 2.9*  CL 104 98 102  CO2 _0 GLUCOSE 227* 163* 68*  BUN 14 15 24*  CREATININE 1.70* 1.95* 2.38*  CALCIUM 8.9 8.9 8.8*  GFRNONAA 43* 36* 29*  GFRAA 50* 42* 33*  ANIONGAP _0 Lab Results  Component Value Date   ALT 19 04/27/2019   AST 19 04/27/2019   ALKPHOS 108 04/27/2019   BILITOT 0.8 04/27/2019   Hematology Recent Labs  Lab 04/27/19 0950 04/28/19 0616  WBC 9.2 8.4  RBC 4.38 4.26  HGB 13.3 13.1  HCT 39.9 39.4  MCV 91.1 92.5  MCH 30.4 30.8  MCHC 33.3 33.2  RDW 13.0 13.1  PLT 324 331   Cardiac Enzymes High Sensitivity Troponin:   Recent Labs  Lab 04/28/19 0616 04/28/19 0748 04/29/19 0516  TROPONINIHS 38* 43* 27*      BNP Recent Labs  Lab 04/27/19 0950 04/28/19 0616  BNP 373.3* 469.9*    DDimer  Recent Labs  Lab 04/28/19 0748  DDIMER 1.40*   TSH:  Lab Results  Component Value Date   TSH 2.650 04/28/2019   Lipids: Lab Results  Component Value Date   CHOL 276 (H) 12/17/2012   HDL 42 12/17/2012   LDLCALC UNABLE TO CALCULATE IF TRIGLYCERIDE OVER 400  mg/dL 12/17/2012   LDLDIRECT 96.5 09/29/2011   TRIG 1,102 (H) 12/17/2012   CHOLHDL 6.6 12/17/2012   Lab Results  Component Value Date   CHOL 172 03/03/2018   HDL 48     LDLCALC 102     TRIG 111      HgbA1c: Lab Results  Component Value Date   HGBA1C 7.2 (H) 03/25/2017   Magnesium:  Magnesium  Date Value Ref Range Status  04/29/2019 1.7 1.7 - 2.4 mg/dL Final    Comment:    Performed at [facility], [place] [address], [place], [place] [address]   Urinalysis No results found for: COLORURINE, APPEARANCEUR, LABSPEC, PHURINE, GLUCOSEU, HGBUR, BILIRUBINUR, KETONESUR, PROTEINUR, UROBILINOGEN, NITRITE, LEUKOCYTESUR   Radiology/Studies:  DG Chest 2 View  Result Date: 04/27/2019 CLINICAL DATA:  Shortness of breath and cough EXAM: CHEST - 2 VIEW COMPARISON:  March 28, 2019 FINDINGS: There is ill-defined airspace opacity in portions of each lung base, slightly more on the right than on the left. Lungs elsewhere clear. Heart size and pulmonary vascularity are normal. No adenopathy. There is degenerative change in the thoracic spine. IMPRESSION: Bibasilar ill-defined opacity, slightly more on the right than the left. Question bibasilar pneumonia. No consolidation. Appearance raises question of atypical organism type pneumonia. Heart size within normal limits.  No evident adenopathy. These results will be called to the ordering clinician or representative by the Radiologist Assistant, and communication documented in the PACS or [employer]. Electronically Signed   By: Lowella Grip III M.D.   On: 04/27/2019 09:14   CT Angio Chest PE W and/or Wo Contrast  Result Date: 04/28/2019 CLINICAL DATA:  59 year old male with acute shortness of breath. EXAM: CT ANGIOGRAPHY CHEST WITH CONTRAST TECHNIQUE: Multidetector CT imaging of the chest was performed using the standard protocol during bolus administration of intravenous contrast. Multiplanar CT image reconstructions and MIPs were  obtained to evaluate the vascular anatomy. CONTRAST:  4m OMNIPAQUE IOHEXOL 350 MG/ML SOLN COMPARISON:  04/28/2019 and prior chest radiographs FINDINGS: Cardiovascular: This is a technically satisfactory study for evaluation of pulmonary emboli. No pulmonary emboli are identified. Cardiomegaly identified. Coronary artery atherosclerotic calcifications are present. There is no evidence of thoracic aortic aneurysm or pericardial effusion. Mediastinum/Nodes: Mildly enlarged mediastinal/bilateral hilar lymph nodes are noted with index nodes as follows: A 1.7 cm RIGHT paratracheal node (series 5: Image 39) A 1.5  cm RIGHT hilar node (5:56) A 1.7 x 2.7 cm subcarinal node (5:61). No enlarged axillary lymph nodes are identified. The visualized thyroid gland and esophagus are unremarkable. Lungs/Pleura: Mild central and LOWER lung ground-glass opacities are noted. No evidence of airspace disease, mass or pneumothorax. Small bilateral pleural effusions are present. Upper Abdomen: No acute abnormality. No enlarged UPPER abdominal lymph nodes are identified. Musculoskeletal: No acute or suspicious bony abnormality. Review of the MIP images confirms the above findings. IMPRESSION: 1. No evidence of pulmonary emboli or thoracic aortic aneurysm. 2. Cardiomegaly, small bilateral pleural effusions and mild central and LOWER lung ground-glass opacities suggestive of pulmonary edema. Superimposed infection is not excluded. 3. Mildly enlarged mediastinal/bilateral hilar lymph nodes - likely reactive given other thoracic findings. Consider chest CT follow-up in 3-6 months. 4. Coronary artery disease. Electronically Signed   By: Margarette Canada M.D.   On: 04/28/2019 09:44   DG Chest Portable 1 View  Result Date: 04/28/2019 CLINICAL DATA:  Shortness of breath EXAM: PORTABLE CHEST 1 VIEW COMPARISON:  04/27/2019 FINDINGS: Mild bilateral lower lobe opacities, atelectasis versus pneumonia, similar. When compared to the prior, pulmonary  vascular congestion is improved. No definite pleural effusions. No pneumothorax. The heart is normal in size. IMPRESSION: Mild bilateral lower lobe opacities, atelectasis versus pneumonia. Additional pulmonary vascular congestion is improved. Electronically Signed   By: Julian Hy M.D.   On: 04/28/2019 06:32   ECHOCARDIOGRAM COMPLETE  Result Date: 04/28/2019    ECHOCARDIOGRAM REPORT   Patient Name:   ALASTOR KNEALE Date of Exam: 04/28/2019 Medical Rec #:  [id number]      Height:       71.0 in Accession #:    [id number]     Weight:       228.8 lb Date of Birth:  [birth year]      BSA:          2.233 m Patient Age:    69 years       BP:           147/104 mmHg Patient Gender: M              HR:           69 bpm. Exam Location:  Inpatient Procedure: 2D Echo Indications:    acute diastolic chf 275.17  History:        Patient has prior history of Echocardiogram examinations, most                 recent 01/19/2012. CAD, chronic kidney disease; Risk                 Factors:Dyslipidemia, Hypertension and Sleep Apnea.  Sonographer:    Johny Chess Referring Phys: [id number] [place]  1. Left ventricular ejection fraction, by estimation, is 50 to 55%. The left ventricle has low normal function. The left ventricle demonstrates regional wall motion abnormalities (see scoring diagram/findings for description). There is mild left ventricular hypertrophy. Left ventricular diastolic parameters are indeterminate.  2. Right ventricular systolic function is normal. The right ventricular size is normal. Tricuspid regurgitation signal is inadequate for assessing PA pressure.  3. The mitral valve is normal in structure. No evidence of mitral valve regurgitation. No evidence of mitral stenosis.  4. The aortic valve is normal in structure. Aortic valve regurgitation is trivial. No aortic stenosis is present.  5. The inferior vena cava is normal in size with greater than 50% respiratory variability, suggesting right  atrial pressure of 3  mmHg. FINDINGS  Left Ventricle: Left ventricular ejection fraction, by estimation, is 50 to 55%. The left ventricle has low normal function. The left ventricle demonstrates regional wall motion abnormalities. The left ventricular internal cavity size was normal in size. There is mild left ventricular hypertrophy. Left ventricular diastolic parameters are indeterminate.  LV Wall Scoring: The entire inferior wall is hypokinetic. Right Ventricle: The right ventricular size is normal. No increase in right ventricular wall thickness. Right ventricular systolic function is normal. Tricuspid regurgitation signal is inadequate for assessing PA pressure. Left Atrium: Left atrial size was normal in size. Right Atrium: Right atrial size was normal in size. Pericardium: There is no evidence of pericardial effusion. Mitral Valve: The mitral valve is normal in structure. Normal mobility of the mitral valve leaflets. No evidence of mitral valve regurgitation. No evidence of mitral valve stenosis. Tricuspid Valve: The tricuspid valve is normal in structure. Tricuspid valve regurgitation is not demonstrated. No evidence of tricuspid stenosis. Aortic Valve: The aortic valve is normal in structure. Aortic valve regurgitation is trivial. No aortic stenosis is present. Pulmonic Valve: The pulmonic valve was normal in structure. Pulmonic valve regurgitation is not visualized. No evidence of pulmonic stenosis. Aorta: The aortic root is normal in size and structure. Venous: The inferior vena cava is normal in size with greater than 50% respiratory variability, suggesting right atrial pressure of 3 mmHg. IAS/Shunts: No atrial level shunt detected by color flow Doppler.  LEFT VENTRICLE PLAX 2D LVIDd:         4.60 cm  Diastology LVIDs:         3.90 cm  LV e' lateral:   3.59 cm/s LV PW:         1.50 cm  LV E/e' lateral: 11.1 LV IVS:        1.00 cm  LV e' medial:    3.81 cm/s LVOT diam:     2.20 cm  LV E/e' medial:  10.5 LV  SV:         49 LV SV Index:   22 LVOT Area:     3.80 cm  RIGHT VENTRICLE RV S prime:     13.80 cm/s TAPSE (M-mode): 1.8 cm LEFT ATRIUM             Index       RIGHT ATRIUM           Index LA diam:        4.30 cm 1.93 cm/m  RA Area:     13.70 cm LA Vol (A2C):   63.6 ml 28.49 ml/m RA Volume:   34.20 ml  15.32 ml/m LA Vol (A4C):   49.6 ml 22.22 ml/m LA Biplane Vol: 55.8 ml 24.99 ml/m  AORTIC VALVE LVOT Vmax:   65.80 cm/s LVOT Vmean:  40.100 cm/s LVOT VTI:    0.130 m  AORTA Ao Root diam: 3.20 cm MV E velocity: 39.90 cm/s MV A velocity: 60.70 cm/s  SHUNTS MV E/A ratio:  0.66        Systemic VTI:  0.13 m                            Systemic Diam: 2.20 cm Cherlynn Kaiser MD Electronically signed by Cherlynn Kaiser MD Signature Date/Time: 04/28/2019/6:56:07 PM    Final     Assessment and Plan:   1. Chest pain - atypical and only mild elevation in hsTrop - ?mild WMA on echo, but no hx  exertional sx - BP very high on admit, 202/106, plus acute CHF, may have been enough to cause the elevation in ez - for now, w/ elevated Cr and no ongoing sx, no further eval planned  2. Acute diastolic CHF - Dr C reviewed echo, WMA is not pronounced, ratios are not convincing of the need for further diuresis. - I/O net - 2.5 L since admit and wt down 8 lbs - edema has resolved, breathing is improved - need to ambulate and see how tolerated. - possible PNA on CXR but no fever or cough, per IM  3. HTN - BP very high on admit, 202/106, 189/119, and similar - med compliance is good, wife helps with this - BP much better controlled now - they need a BP cuff to follow BP, may need increased rx - amlodipine is new, dose reduced 10 mg>>5 mg - on home doses Toprol XL 100 mg qd, hydralazine 25 mg tid, lisinopril 40 mg qd, Imdur 120 mg qd - Lasix home dose was 20 mg qd  4. Hypokalemia and hypomagnesemia - some vent ectopy seen on telemetry - give total 80 meq Kdur today - start Mag-ox 400 mg qd, likely does not need  that dose at d/c  5. CKD III - 02/2018 BUN/Cr 25/1.65 - 10/2018 21/1.75 - he was 14/1.70 on admit>>24/2.38 today - discuss holding lisinopril w/ MD  Otherwise, per IM Active Problems:   Hypertension   Diabetes mellitus ([place])   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 diabetes mellitus (HCC)   Obesity, Class I, BMI 30-34.9   CHF (congestive heart failure) ([place])   For questions or updates, please contact [facility] Please consult [email or web address] for contact info under Cardiology/STEMI.   Signed, Rosaria Ferries, PA-C  04/30/2019 11:45 AM  I have seen and examined the patient along with Rosaria Ferries, PA-C .  I have reviewed the chart, notes and new data.  I agree with PA/NP's note.  Key new complaints: Was initially treated for "pneumonia", but review of chest x-ray suggest this was probably congestive heart failure all along.  Following diuretics, breathing is much improved, though he still becomes short of breath walking to the bathroom or bending over.  Edema is completely gone.  His nephrologist is Dr. Marland Kitchen in [facility].  He tells me that he had recently tried switching him from furosemide to torsemide (does this mean his renal function was worsening?). Key examination changes: No edema.  JVP minimally elevated above the sternal angle, some abdominal distention, clear lungs. Key new findings / data: Marked increase in creatinine from admission levels and severe hypokalemia.  I reviewed his echocardiogram.  I am not convinced that there is a remarkable degree of inferior wall hypokinesis compared with the other walls of the left ventricle. He reports undergoing a "chemical stress test" at [facility] within the last couple of months and was told that it was normal.  PLAN: Hold diuretics today due to worsening renal function and severe hypokalemia.  Replace potassium. 24-hour urine collection for protein and creatinine (note albumin/creatinine ratio of 372.9 in January 2020).  I wonder if  he has nephrotic syndrome (although his albumin is not that bad at 3.2). I wonder whether his worsening volume status is due to a change in renal function, rather than a change in cardiac function. Discussed the fact that we have "somewhat" conflicting interest in use of diuretics with renal versus cardiac problems. I would not recommend coronary geography at this time.  I believe the risk of worsening renal function due to contrast nephrotoxicity outweighs the diagnostic yield.  Sanda Klein, MD, [place] [phone] 04/30/2019, 1:54 PM

## 2019-04-30 NOTE — Progress Notes (Signed)
Patient have 5 beats of V tach, asymptomatic, cardiology PA notified second dose of potassium and magnesium  given per order. Will continue to monitor the patient.

## 2019-05-01 DIAGNOSIS — Z794 Long term (current) use of insulin: Secondary | ICD-10-CM

## 2019-05-01 DIAGNOSIS — I5033 Acute on chronic diastolic (congestive) heart failure: Secondary | ICD-10-CM

## 2019-05-01 DIAGNOSIS — G4733 Obstructive sleep apnea (adult) (pediatric): Secondary | ICD-10-CM

## 2019-05-01 LAB — PROTEIN, URINE, 24 HOUR
Collection Interval-UPROT: 24 hours
Protein, 24H Urine: 1503 mg/d — ABNORMAL HIGH (ref 50–100)
Protein, Urine: 167 mg/dL
Urine Total Volume-UPROT: 900 mL

## 2019-05-01 LAB — BASIC METABOLIC PANEL
Anion gap: 11 (ref 5–15)
BUN: 29 mg/dL — ABNORMAL HIGH (ref 6–20)
CO2: 27 mmol/L (ref 22–32)
Calcium: 8.7 mg/dL — ABNORMAL LOW (ref 8.9–10.3)
Chloride: 102 mmol/L (ref 98–111)
Creatinine, Ser: 2.39 mg/dL — ABNORMAL HIGH (ref 0.61–1.24)
GFR calc Af Amer: 33 mL/min — ABNORMAL LOW (ref >60)
GFR calc non Af Amer: 28 mL/min — ABNORMAL LOW (ref >60)
Glucose, Bld: 106 mg/dL — ABNORMAL HIGH (ref 70–99)
Potassium: 3.7 mmol/L (ref 3.5–5.1)
Sodium: 140 mmol/L (ref 135–145)

## 2019-05-01 LAB — GLUCOSE, CAPILLARY
Glucose-Capillary: 104 mg/dL — ABNORMAL HIGH (ref 70–99)
Glucose-Capillary: 86 mg/dL (ref 70–99)
Glucose-Capillary: 193 mg/dL — ABNORMAL HIGH (ref 70–99)

## 2019-05-01 LAB — CREATININE CLEARANCE, URINE, 24 HOUR
Collection Interval-CRCL: 24 hours
Creatinine Clearance: 47 mL/min — ABNORMAL LOW (ref 75–125)
Creatinine, 24H Ur: 1601 mg/d (ref 800–2000)
Creatinine, Urine: 177.9 mg/dL
Urine Total Volume-CRCL: 900 mL

## 2019-05-01 MED ORDER — ISOSORBIDE MONONITRATE ER 120 MG PO TB24: 120.0000 mg | ORAL_TABLET | Freq: Every day | ORAL | 0 refills | Status: DC

## 2019-05-01 MED ORDER — AMLODIPINE BESYLATE 5 MG PO TABS: 5.0000 mg | ORAL_TABLET | Freq: Every day | ORAL | 0 refills | Status: DC

## 2019-05-01 MED ORDER — POTASSIUM CHLORIDE CRYS ER 20 MEQ PO TBCR: 20.0000 meq | EXTENDED_RELEASE_TABLET | Freq: Every day | ORAL | Status: DC

## 2019-05-01 MED ORDER — INSULIN GLARGINE 100 UNIT/ML ~~LOC~~ SOLN: 37.0000 [IU] | Freq: Every day | SUBCUTANEOUS | 11 refills | Status: DC

## 2019-05-01 MED ORDER — POTASSIUM CHLORIDE CRYS ER 20 MEQ PO TBCR: 40.0000 meq | EXTENDED_RELEASE_TABLET | Freq: Two times a day (BID) | ORAL | 0 refills | Status: DC

## 2019-05-01 MED ORDER — FUROSEMIDE 40 MG PO TABS
40.0000 mg | ORAL_TABLET | Freq: Every day | ORAL | Status: DC
  Administered 2019-05-01: 40 mg via ORAL
  Filled 2019-05-01: qty 1

## 2019-05-01 MED ORDER — POTASSIUM CHLORIDE CRYS ER 20 MEQ PO TBCR: 20.0000 meq | EXTENDED_RELEASE_TABLET | Freq: Every day | ORAL | 0 refills | Status: DC

## 2019-05-01 MED ORDER — FUROSEMIDE 40 MG PO TABS: 40.0000 mg | ORAL_TABLET | Freq: Every day | ORAL | 0 refills | Status: DC

## 2019-05-01 MED ORDER — ASPIRIN 81 MG PO TBEC: 81.0000 mg | DELAYED_RELEASE_TABLET | Freq: Every day | ORAL | Status: AC

## 2019-05-01 MED ORDER — MAGNESIUM OXIDE 400 (241.3 MG) MG PO TABS: 400.0000 mg | ORAL_TABLET | Freq: Every day | ORAL | Status: DC

## 2019-05-01 NOTE — Discharge Summary (Addendum)
Physician Discharge Summary  Roberto Allen EPP:[id number] DOB: [birth year] DOA: 04/28/2019  PCP: [name], MD  Admit date: 04/28/2019 Discharge date: 05/01/2019  Admitted From: Home Discharge disposition: Home   Recommendations for Outpatient Follow-Up:   1. 24-hour urine results pending 2. Patient to schedule an ASAP appointment with nephrologist 3. BMP 1 week 4. Patient to weigh daily 5. Patient to be compliant with CPAP 6. Incidental finding of hilar LN: Consider chest CT follow-up in 3-6 months.   Discharge Diagnosis:   Active Problems:   Hypertension   Diabetes mellitus ([place])   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 diabetes mellitus (HCC)   Obesity, Class I, BMI 30-34.9   CHF (congestive heart failure) ([place])    Discharge Condition: Improved.  Diet recommendation: Low sodium, heart healthy.  Carbohydrate-modified  Wound care: None.  Code status: Full.   History of Present Illness:  Roberto Allen is a 59 y.o. male with medical history significant of CAD, CHF, CKD stage II, IDDM, COPD, GERD, hyperlipidemia, hypertension, hypertriglyceridemia, cardiomyopathy, presented with increasing short of breath for 1 month. He was treated for pneumonia at the beginning, feeling better a bit then deteriorated again. Continued to have dry cough and SOB on exertion, and he came to ED yesterday with and COVID PCR was negative. Denies any chest pain or associated fever, chills or body aches. He gained about 15 LBS since last month and significant increase of leg swelling. He lost follow up with cardiology about 5 years ago and all his CHF and BP meds were managed by his PCP, and he was briefly switched from Lasix to Torsemide last year and switched back to Lasix daily again since. No changes of his BP meds for about 2-3 years. No fever, chill, no abd pain, no dysuria or diarrhea.   Hospital Course by Problem:   Acute diastolic CHF decompensation Likely from Uncontrolled  HTN/nephrotic syndrome -His symptoms  improved after a dose of IV Lasix in ED -given Lasix BID yesterday with good diuresis but now Cr trending up so IV diuresis held. Echo :Left ventricular ejection fraction, by estimation, is 50 to 55%. The  left ventricle has low normal function. The left ventricle demonstrates  regional wall motion abnormalities (see scoring diagram/findings for description). There is mild left ventricular hypertrophy. Left ventricular diastolic parameters are indeterminate.  -patient continues to c/o dyspnea and chest discomfort with exertion-- will get cardiology consult-- per patient he had a recent stress test with [place] (his PCP) and it was normal-- will request records today -Continue hydralazine/Imdur 24-hour urine collection in process please follow-up on results Cardiology consultation appreciated  HTN emergency -Improved with diuresis  Elevated Troponin Trop level pattern is flat, not ACS -Suspect demand  IDDM-type II -Encourage close follow-up with PCP -We'll need to monitor blood sugars closely as creatinine has worsened and he may not clear completely  Acute on CKD Stage IIIa - eGFR 45-59 -Baseline GFR from September 2020 was 48 -Current GFR is 39.8-patient currently euvolemic -24-hour urine in process -Patient to call and get appointment with nephrology ASAP  Combined HLD Continue Statin and Fibrate  obesity Body mass index is 30.82 kg/m.  Hypokalemia -Replete along with magnesium    Medical Consultants:   Cardiology   Discharge Exam:   Vitals:   05/01/19 0909 05/01/19 1213  BP: 118/72 122/74  Pulse: 73 66  Resp: 16 18  Temp: 98.2 F (36.8 C) 97.9 F (36.6 C)  SpO2: 100% 98%  Vitals:   04/30/19 1955 05/01/19 0358 05/01/19 0909 05/01/19 1213  BP: 125/69 115/64 118/72 122/74  Pulse: 73 73 73 66  Resp: _0 Temp: 98.8 F (37.1 C) 98.3 F (36.8 C) 98.2 F (36.8 C) 97.9 F (36.6 C)  TempSrc: Oral  Oral Oral Oral  SpO2: 95% 97% 100% 98%  Weight:  101.1 kg    Height:        General exam: Appears calm and comfortable.   The results of significant diagnostics from this hospitalization (including imaging, microbiology, ancillary and laboratory) are listed below for reference.     Procedures and Diagnostic Studies:   CT Angio Chest PE W and/or Wo Contrast  Result Date: 04/28/2019 CLINICAL DATA:  59 year old male with acute shortness of breath. EXAM: CT ANGIOGRAPHY CHEST WITH CONTRAST TECHNIQUE: Multidetector CT imaging of the chest was performed using the standard protocol during bolus administration of intravenous contrast. Multiplanar CT image reconstructions and MIPs were obtained to evaluate the vascular anatomy. CONTRAST:  61m OMNIPAQUE IOHEXOL 350 MG/ML SOLN COMPARISON:  04/28/2019 and prior chest radiographs FINDINGS: Cardiovascular: This is a technically satisfactory study for evaluation of pulmonary emboli. No pulmonary emboli are identified. Cardiomegaly identified. Coronary artery atherosclerotic calcifications are present. There is no evidence of thoracic aortic aneurysm or pericardial effusion. Mediastinum/Nodes: Mildly enlarged mediastinal/bilateral hilar lymph nodes are noted with index nodes as follows: A 1.7 cm RIGHT paratracheal node (series 5: Image 39) A 1.5 cm RIGHT hilar node (5:56) A 1.7 x 2.7 cm subcarinal node (5:61). No enlarged axillary lymph nodes are identified. The visualized thyroid gland and esophagus are unremarkable. Lungs/Pleura: Mild central and LOWER lung ground-glass opacities are noted. No evidence of airspace disease, mass or pneumothorax. Small bilateral pleural effusions are present. Upper Abdomen: No acute abnormality. No enlarged UPPER abdominal lymph nodes are identified. Musculoskeletal: No acute or suspicious bony abnormality. Review of the MIP images confirms the above findings. IMPRESSION: 1. No evidence of pulmonary emboli or thoracic aortic  aneurysm. 2. Cardiomegaly, small bilateral pleural effusions and mild central and LOWER lung ground-glass opacities suggestive of pulmonary edema. Superimposed infection is not excluded. 3. Mildly enlarged mediastinal/bilateral hilar lymph nodes - likely reactive given other thoracic findings. Consider chest CT follow-up in 3-6 months. 4. Coronary artery disease. Electronically Signed   By: JMargarette CanadaM.D.   On: 04/28/2019 09:44   DG Chest Portable 1 View  Result Date: 04/28/2019 CLINICAL DATA:  Shortness of breath EXAM: PORTABLE CHEST 1 VIEW COMPARISON:  04/27/2019 FINDINGS: Mild bilateral lower lobe opacities, atelectasis versus pneumonia, similar. When compared to the prior, pulmonary vascular congestion is improved. No definite pleural effusions. No pneumothorax. The heart is normal in size. IMPRESSION: Mild bilateral lower lobe opacities, atelectasis versus pneumonia. Additional pulmonary vascular congestion is improved. Electronically Signed   By: SJulian HyM.D.   On: 04/28/2019 06:32   ECHOCARDIOGRAM COMPLETE  Result Date: 04/28/2019    ECHOCARDIOGRAM REPORT   Patient Name:   JJDEN WANTDate of Exam: 04/28/2019 Medical Rec #:  [id number]     Height:       71.0 in Accession #:    [id number]    Weight:       228.8 lb Date of Birth:  [birth year]     BSA:          2.233 m Patient Age:    89years       BP:  147/104 mmHg Patient Gender: M              HR:           69 bpm. Exam Location:  Inpatient Procedure: 2D Echo Indications:    acute diastolic chf 701.77  History:        Patient has prior history of Echocardiogram examinations, most                 recent 01/19/2012. CAD, chronic kidney disease; Risk                 Factors:Dyslipidemia, Hypertension and Sleep Apnea.  Sonographer:    Johny Chess Referring Phys: [id number] [place]  1. Left ventricular ejection fraction, by estimation, is 50 to 55%. The left ventricle has low normal function. The left ventricle  demonstrates regional wall motion abnormalities (see scoring diagram/findings for description). There is mild left ventricular hypertrophy. Left ventricular diastolic parameters are indeterminate.  2. Right ventricular systolic function is normal. The right ventricular size is normal. Tricuspid regurgitation signal is inadequate for assessing PA pressure.  3. The mitral valve is normal in structure. No evidence of mitral valve regurgitation. No evidence of mitral stenosis.  4. The aortic valve is normal in structure. Aortic valve regurgitation is trivial. No aortic stenosis is present.  5. The inferior vena cava is normal in size with greater than 50% respiratory variability, suggesting right atrial pressure of 3 mmHg. FINDINGS  Left Ventricle: Left ventricular ejection fraction, by estimation, is 50 to 55%. The left ventricle has low normal function. The left ventricle demonstrates regional wall motion abnormalities. The left ventricular internal cavity size was normal in size. There is mild left ventricular hypertrophy. Left ventricular diastolic parameters are indeterminate.  LV Wall Scoring: The entire inferior wall is hypokinetic. Right Ventricle: The right ventricular size is normal. No increase in right ventricular wall thickness. Right ventricular systolic function is normal. Tricuspid regurgitation signal is inadequate for assessing PA pressure. Left Atrium: Left atrial size was normal in size. Right Atrium: Right atrial size was normal in size. Pericardium: There is no evidence of pericardial effusion. Mitral Valve: The mitral valve is normal in structure. Normal mobility of the mitral valve leaflets. No evidence of mitral valve regurgitation. No evidence of mitral valve stenosis. Tricuspid Valve: The tricuspid valve is normal in structure. Tricuspid valve regurgitation is not demonstrated. No evidence of tricuspid stenosis. Aortic Valve: The aortic valve is normal in structure. Aortic valve regurgitation  is trivial. No aortic stenosis is present. Pulmonic Valve: The pulmonic valve was normal in structure. Pulmonic valve regurgitation is not visualized. No evidence of pulmonic stenosis. Aorta: The aortic root is normal in size and structure. Venous: The inferior vena cava is normal in size with greater than 50% respiratory variability, suggesting right atrial pressure of 3 mmHg. IAS/Shunts: No atrial level shunt detected by color flow Doppler.  LEFT VENTRICLE PLAX 2D LVIDd:         4.60 cm  Diastology LVIDs:         3.90 cm  LV e' lateral:   3.59 cm/s LV PW:         1.50 cm  LV E/e' lateral: 11.1 LV IVS:        1.00 cm  LV e' medial:    3.81 cm/s LVOT diam:     2.20 cm  LV E/e' medial:  10.5 LV SV:         49 LV SV Index:  22 LVOT Area:     3.80 cm  RIGHT VENTRICLE RV S prime:     13.80 cm/s TAPSE (M-mode): 1.8 cm LEFT ATRIUM             Index       RIGHT ATRIUM           Index LA diam:        4.30 cm 1.93 cm/m  RA Area:     13.70 cm LA Vol (A2C):   63.6 ml 28.49 ml/m RA Volume:   34.20 ml  15.32 ml/m LA Vol (A4C):   49.6 ml 22.22 ml/m LA Biplane Vol: 55.8 ml 24.99 ml/m  AORTIC VALVE LVOT Vmax:   65.80 cm/s LVOT Vmean:  40.100 cm/s LVOT VTI:    0.130 m  AORTA Ao Root diam: 3.20 cm MV E velocity: 39.90 cm/s MV A velocity: 60.70 cm/s  SHUNTS MV E/A ratio:  0.66        Systemic VTI:  0.13 m                            Systemic Diam: 2.20 cm Cherlynn Kaiser MD Electronically signed by Cherlynn Kaiser MD Signature Date/Time: 04/28/2019/6:56:07 PM    Final      Labs:   Basic Metabolic Panel: Recent Labs  Lab 04/27/19 0950 04/27/19 0950 04/28/19 9937 04/28/19 1696 04/29/19 0516 04/29/19 0516 04/30/19 0535 05/01/19 0310  NA 139  --  139  --  140  --  143 140  K 3.3*   < > 3.7   < > 3.1*   < > 2.9* 3.7  CL 102  --  104  --  98  --  102 102  CO2 28  --  23  --  28  --  27 27  GLUCOSE 134*  --  227*  --  163*  --  68* 106*  BUN 14  --  14  --  15  --  24* 29*  CREATININE 1.61*  --  1.70*  --  1.95*   --  2.38* 2.39*  CALCIUM 8.9  --  8.9  --  8.9  --  8.8* 8.7*  MG  --   --   --   --  1.7  --   --   --    < > = values in this interval not displayed.   GFR Estimated Creatinine Clearance: 39.8 mL/min (A) (by C-G formula based on SCr of 2.39 mg/dL (H)). Liver Function Tests: Recent Labs  Lab 04/27/19 0950  AST 19  ALT 19  ALKPHOS 108  BILITOT 0.8  PROT 6.4*  ALBUMIN 3.2*   No results for input(s): LIPASE, AMYLASE in the last 168 hours. No results for input(s): AMMONIA in the last 168 hours. Coagulation profile No results for input(s): INR, PROTIME in the last 168 hours.  CBC: Recent Labs  Lab 04/27/19 0950 04/28/19 0616  WBC 9.2 8.4  NEUTROABS 6.6  --   HGB 13.3 13.1  HCT 39.9 39.4  MCV 91.1 92.5  PLT 324 331   Cardiac Enzymes: No results for input(s): CKTOTAL, CKMB, CKMBINDEX, TROPONINI in the last 168 hours. BNP: Invalid input(s): POCBNP CBG: Recent Labs  Lab 04/30/19 1128 04/30/19 1637 04/30/19 2107 05/01/19 0609 05/01/19 1214  GLUCAP 139* 154* 120* 104* 86   D-Dimer No results for input(s): DDIMER in the last 72 hours. Hgb A1c No results for input(s): HGBA1C in  the last 72 hours. Lipid Profile No results for input(s): CHOL, HDL, LDLCALC, TRIG, CHOLHDL, LDLDIRECT in the last 72 hours. Thyroid function studies Recent Labs    04/28/19 1506  TSH 2.650   Anemia work up No results for input(s): VITAMINB12, FOLATE, FERRITIN, TIBC, IRON, RETICCTPCT in the last 72 hours. Microbiology Recent Results (from the past 240 hour(s))  SARS CORONAVIRUS 2 (TAT 6-24 HRS) Nasopharyngeal Nasopharyngeal Swab     Status: None   Collection Time: 04/27/19  9:49 AM   Specimen: Nasopharyngeal Swab  Result Value Ref Range Status   SARS Coronavirus 2 NEGATIVE NEGATIVE Final    Comment: (NOTE) SARS-CoV-2 target nucleic acids are NOT DETECTED. The SARS-CoV-2 RNA is generally detectable in upper and lower respiratory specimens during the acute phase of infection.  Negative results do not preclude SARS-CoV-2 infection, do not rule out co-infections with other pathogens, and should not be used as the sole basis for treatment or other patient management decisions. Negative results must be combined with clinical observations, patient history, and epidemiological information. The expected result is Negative. Fact Sheet for Patients: SugarRoll.be Fact Sheet for Healthcare Providers: https://www.woods-mathews.com/ This test is not yet approved or cleared by the [place] FDA and  has been authorized for detection and/or diagnosis of SARS-CoV-2 by FDA under an Emergency Use Authorization (EUA). This EUA will remain  in effect (meaning this test can be used) for the duration of the COVID-19 declaration under Section 56 4(b)(1) of the Act, 21 U.S.C. section 360bbb-3(b)(1), unless the authorization is terminated or revoked sooner. Performed at [facility], [place] [address], [place], [place] [address]   SARS CORONAVIRUS 2 (TAT 6-24 HRS) Nasopharyngeal Nasopharyngeal Swab     Status: None   Collection Time: 04/28/19 10:04 AM   Specimen: Nasopharyngeal Swab  Result Value Ref Range Status   SARS Coronavirus 2 NEGATIVE NEGATIVE Final    Comment: (NOTE) SARS-CoV-2 target nucleic acids are NOT DETECTED. The SARS-CoV-2 RNA is generally detectable in upper and lower respiratory specimens during the acute phase of infection. Negative results do not preclude SARS-CoV-2 infection, do not rule out co-infections with other pathogens, and should not be used as the sole basis for treatment or other patient management decisions. Negative results must be combined with clinical observations, patient history, and epidemiological information. The expected result is Negative. Fact Sheet for Patients: SugarRoll.be Fact Sheet for Healthcare  Providers: https://www.woods-mathews.com/ This test is not yet approved or cleared by the [place] FDA and  has been authorized for detection and/or diagnosis of SARS-CoV-2 by FDA under an Emergency Use Authorization (EUA). This EUA will remain  in effect (meaning this test can be used) for the duration of the COVID-19 declaration under Section 56 4(b)(1) of the Act, 21 U.S.C. section 360bbb-3(b)(1), unless the authorization is terminated or revoked sooner. Performed at [facility], [place] [address], [place], [place] [address]      Discharge Instructions:   Discharge Instructions    (HEART FAILURE PATIENTS) Call MD:  Anytime you have any of the following symptoms: 1) 3 pound weight gain in 24 hours or 5 pounds in 1 week 2) shortness of breath, with or without a dry hacking cough 3) swelling in the hands, feet or stomach 4) if you have to sleep on extra pillows at night in order to breathe.   Complete by: As directed    Diet - low sodium heart healthy   Complete by: As directed    Diet Carb  Modified   Complete by: As directed    Discharge instructions   Complete by: As directed    Monitor blood sugars closely as you may not clear from your system as well with an elevated kidney function -hold ozempic until you know how your blood sugars are running -make appointment with nephrology -wear CPAP   Increase activity slowly   Complete by: As directed      Allergies as of 05/01/2019      Reactions   Metformin And Related Other (See Comments)   Due to CHF and CRI      Medication List    STOP taking these medications   aspirin 81 MG tablet Replaced by: aspirin 81 MG EC tablet   benzonatate 100 MG capsule Commonly known as: TESSALON   fenofibrate 48 MG tablet Commonly known as: Tricor   guaiFENesin 100 MG/5ML liquid Commonly known as: ROBITUSSIN   HYDROcodone-acetaminophen 5-325 MG tablet Commonly known as: NORCO/VICODIN   lisinopril 40 MG  tablet Commonly known as: ZESTRIL   Ozempic (1 MG/DOSE) 2 MG/1.5ML Sopn Generic drug: Semaglutide (1 MG/DOSE)   promethazine 25 MG tablet Commonly known as: PHENERGAN   promethazine-dextromethorphan 6.25-15 MG/5ML syrup Commonly known as: PROMETHAZINE-DM   silver sulfADIAZINE 1 % cream Commonly known as: Silvadene   traZODone 50 MG tablet Commonly known as: DESYREL     TAKE these medications   Accu-Chek FastClix Lancets Misc USE TO MONITOR BLOOD GLUCOSE 3 TIME(S) DAILY   Accu-Chek Guide w/Device Kit 3 TIMES A DAY. USE AS DIRECTED BY PHYSICIAN TO MONITOR BLOOD SUGARS   amLODipine 5 MG tablet Commonly known as: NORVASC Take 1 tablet (5 mg total) by mouth daily.   aspirin 81 MG EC tablet Take 1 tablet (81 mg total) by mouth daily. Start taking on: May 02, 2019 Replaces: aspirin 81 MG tablet   atorvastatin 10 MG tablet Commonly known as: LIPITOR Take 10 mg by mouth every morning.   BD Pen Needle Nano U/F 32G X 4 MM Misc Generic drug: Insulin Pen Needle 1 EACH BY DOES NOT APPLY ROUTE 2 (TWO) TIMES DAILY.   furosemide 40 MG tablet Commonly known as: LASIX Take 1 tablet (40 mg total) by mouth daily. Start taking on: May 02, 2019 What changed: Another medication with the same name was removed. Continue taking this medication, and follow the directions you see here.   hydrALAZINE 25 MG tablet Commonly known as: APRESOLINE Take 25 mg by mouth 3 (three) times daily.   insulin glargine 100 UNIT/ML injection Commonly known as: Lantus Inject 0.37 mLs (37 Units total) into the skin at bedtime. What changed: how much to take   isosorbide mononitrate 120 MG 24 hr tablet Commonly known as: IMDUR Take 1 tablet (120 mg total) by mouth daily. Start taking on: May 02, 2019   magnesium oxide 400 (241.3 Mg) MG tablet Commonly known as: MAG-OX Take 1 tablet (400 mg total) by mouth daily. Start taking on: May 02, 2019   metoprolol succinate 100 MG 24 hr  tablet Commonly known as: TOPROL-XL Take 100 mg by mouth daily.   omeprazole 20 MG capsule Commonly known as: PRILOSEC Take 20 mg by mouth daily.   OneTouch Verio test strip Generic drug: glucose blood 4-5 TIMES PER DAY   potassium chloride SA 20 MEQ tablet Commonly known as: KLOR-CON Take 1 tablet (20 mEq total) by mouth daily.      Follow-up Information    [name], MD In 1 week.  Specialty: Internal Medicine Why: Please follow up in a week;walk in clinic Contact information: [address] [place] [address] [phone]        Schedule an appointment as soon as possible for a visit with Dimas Aguas, MD.   Specialty: Internal Medicine Contact information: [address] [place] [address] [phone]            Time coordinating discharge: 35 min  Signed:  Geradine Girt DO  [facility] 05/01/2019, 3:25 PM

## 2019-05-01 NOTE — Progress Notes (Signed)
24 hr urine screen delivered to lab. Will go over discharge instructions with patient and discuss follow up care

## 2019-05-01 NOTE — Progress Notes (Signed)
ReDS Clip Diuretic Study Pt study # [id number]  Your patient is in the Blinded arm of the ReDS Clip Diuretic study.  Your patient has had a ReDS reading and the reading has been transmitted to the cloud.   Thank You   The research team   Vertis Kelch, PharmD, [facility] PGY2 Cardiology Pharmacy Resident Phone [phone] 05/01/2019       1:42 PM  Please check [email or web address] for unit-specific pharmacist phone numbers

## 2019-05-01 NOTE — Progress Notes (Signed)
Progress Note  Patient Name: Roberto Allen Date of Encounter: 05/01/2019  Primary Cardiologist: No primary care provider on file.   Subjective   Feeling well.  Lying completely supine in bed without breathing difficulty.  Did not feel short of breath walking to the bathroom, but does get a little winded if he bends over.  Inpatient Medications    Scheduled Meds: . amLODipine  5 mg Oral Daily  . aspirin EC  81 mg Oral Daily  . atorvastatin  10 mg Oral q1800  . heparin  5,000 Units Subcutaneous Q8H  . hydrALAZINE  25 mg Oral TID  . insulin aspart  0-15 Units Subcutaneous TID WC  . insulin glargine  37 Units Subcutaneous QHS  . isosorbide mononitrate  120 mg Oral Daily  . magnesium oxide  400 mg Oral Daily  . metoprolol succinate  100 mg Oral Daily  . pantoprazole  40 mg Oral Daily  . potassium chloride  40 mEq Oral BID  . sodium chloride flush  3 mL Intravenous Q12H   Continuous Infusions: . sodium chloride     PRN Meds: sodium chloride, acetaminophen, ondansetron (ZOFRAN) IV, sodium chloride flush, traZODone   Vital Signs    Vitals:   04/30/19 1309 04/30/19 1636 04/30/19 1955 05/01/19 0358  BP: 122/60 122/71 125/69 115/64  Pulse: 66 68 73 73  Resp:  18 16 17   Temp: 97.9 F (36.6 C) 98.1 F (36.7 C) 98.8 F (37.1 C) 98.3 F (36.8 C)  TempSrc: Oral Oral Oral Oral  SpO2: 98% 94% 95% 97%  Weight:    101.1 kg  Height:        Intake/Output Summary (Last 24 hours) at 05/01/2019 0819 Last data filed at 05/01/2019 0500 Gross per 24 hour  Intake 940 ml  Output 1425 ml  Net -485 ml   Last 3 Weights 05/01/2019 04/30/2019 04/29/2019  Weight (lbs) 222 lb 14.4 oz 221 lb 221 lb 3.2 oz  Weight (kg) 101.107 kg 100.245 kg 100.336 kg      Telemetry    Sinus rhythm, a single 5 beat episode of nonsustained VT yesterday- Personally Reviewed  ECG    No new tracing, 3/13 sinus rhythm with mild PR prolongation.- Personally Reviewed  Physical Exam  Lying fully flat in bed  without difficulty GEN: No acute distress.   Neck:  2-3 cm JVP above the clavicle Cardiac: RRR, no murmurs, rubs, or gallops.  Respiratory: Clear to auscultation bilaterally. GI: Soft, nontender, non-distended  MS: No edema; No deformity. Neuro:  Nonfocal  Psych: Normal affect   Labs    High Sensitivity Troponin:   Recent Labs  Lab 04/28/19 0616 04/28/19 0748 04/29/19 0516  TROPONINIHS 38* 43* 27*      Chemistry Recent Labs  Lab 04/27/19 0950 04/28/19 0616 04/29/19 0516 04/30/19 0535 05/01/19 0310  NA 139   < > 140 143 140  K 3.3*   < > 3.1* 2.9* 3.7  CL 102   < > 98 102 102  CO2 28   < > 28 27 27   GLUCOSE 628*   < > 163* 68* 106*  BUN 14   < > 15 24* 29*  CREATININE 1.61*   < > 1.95* 2.38* 2.39*  CALCIUM 8.9   < > 8.9 8.8* 8.7*  PROT 6.4*  --   --   --   --   ALBUMIN 3.2*  --   --   --   --   AST 19  --   --   --   --  ALT 19  --   --   --   --   ALKPHOS 108  --   --   --   --   BILITOT 0.8  --   --   --   --   GFRNONAA 46*   < > 36* 29* 28*  GFRAA 53*   < > 42* 33* 33*  ANIONGAP 9   < > 14 14 11    < > = values in this interval not displayed.     Hematology Recent Labs  Lab 04/27/19 0950 04/28/19 0616  WBC 9.2 8.4  RBC 4.38 4.26  HGB 13.3 13.1  HCT 39.9 39.4  MCV 91.1 92.5  MCH 30.4 30.8  MCHC 33.3 33.2  RDW 13.0 13.1  PLT 324 331    BNP Recent Labs  Lab 04/27/19 0950 04/28/19 0616  BNP 373.3* 469.9*     DDimer  Recent Labs  Lab 04/28/19 0748  DDIMER 1.40*     Radiology    No results found.  Cardiac Studies  Echo 04/28/2019 1. Left ventricular ejection fraction, by estimation, is 50 to 55%. The  left ventricle has low normal function. The left ventricle demonstrates  regional wall motion abnormalities (see scoring diagram/findings for  description). There is mild left  ventricular hypertrophy. Left ventricular diastolic parameters are  indeterminate.  2. Right ventricular systolic function is normal. The right ventricular   size is normal. Tricuspid regurgitation signal is inadequate for assessing  PA pressure.  3. The mitral valve is normal in structure. No evidence of mitral valve  regurgitation. No evidence of mitral stenosis.  4. The aortic valve is normal in structure. Aortic valve regurgitation is  trivial. No aortic stenosis is present.  5. The inferior vena cava is normal in size with greater than 50%  respiratory variability, suggesting right atrial pressure of 3 mmHg.   Patient Profile     59 y.o. male with a hx of non-obs CAD at cath 2013, NICM, history of systolic heart failure with LVEF 30-35%, now improved to 50-55%, DM with CKD stage IIIb, HTN, HLD, GERD, OSA (not using CPAP due to lack of insurance).  Assessment & Plan    1. CHF: I think he is currently euvolemic.  Try to keep fluids net neutral.  I do not think he needs another dose of metolazone.  Need to maintain balance between heart failure and worsening kidney function.  Unable to use RAAS inhibitors, at least not right now.  On hydralazine/nitrates and metoprolol succinate. I do not believe there is a meaningful change in LVEF compared to his last echocardiogram and I do not think he would benefit from cardiac catheterization.  The risk to his renal function outweighs the potential diagnostic yield.  I wonder if some of his volume retention could be related to nephrotic syndrome as well.  From a cardiac point of view he would be ready for discharge once he completes his 24-hour urine collection. 2.  Acute on CKD 3b: Creatinine stable since yesterday, but substantially higher than on admission.  Baseline creatinine seems to be roughly 1.8 over the last 4-5years. Hypokalemia corrected. 3. HTN: Now well corrected.  Target BP 130/80 or less. 4. OSA: He declined CPAP last night.  In the long run, using CPAP consistently will prevent further complications of worsening heart failure, arrhythmia, pulmonary hypertension.  Need to make sure that this  treatment is available to him and that he becomes compliant with it.  For questions or updates, please contact [place] Please consult [email or web address] for contact info under        Signed, Sanda Klein, MD  05/01/2019, 8:19 AM

## 2019-05-17 ENCOUNTER — Ambulatory Visit: Payer: BC Managed Care – PPO | Admitting: Physician Assistant

## 2019-05-25 ENCOUNTER — Encounter: Payer: Self-pay | Admitting: Physician Assistant

## 2019-05-25 ENCOUNTER — Ambulatory Visit (INDEPENDENT_AMBULATORY_CARE_PROVIDER_SITE_OTHER): Payer: BC Managed Care – PPO | Admitting: Physician Assistant

## 2019-05-25 ENCOUNTER — Other Ambulatory Visit: Payer: Self-pay

## 2019-05-25 VITALS — BP 130/78 | HR 75 | Temp 97.0°F | Ht 71.0 in | Wt 236.0 lb

## 2019-05-25 DIAGNOSIS — I2510 Atherosclerotic heart disease of native coronary artery without angina pectoris: Secondary | ICD-10-CM

## 2019-05-25 DIAGNOSIS — J449 Chronic obstructive pulmonary disease, unspecified: Secondary | ICD-10-CM

## 2019-05-25 DIAGNOSIS — N183 Chronic kidney disease, stage 3 unspecified: Secondary | ICD-10-CM

## 2019-05-25 DIAGNOSIS — I151 Hypertension secondary to other renal disorders: Secondary | ICD-10-CM

## 2019-05-25 DIAGNOSIS — N2889 Other specified disorders of kidney and ureter: Secondary | ICD-10-CM

## 2019-05-25 DIAGNOSIS — I5032 Chronic diastolic (congestive) heart failure: Secondary | ICD-10-CM

## 2019-05-25 DIAGNOSIS — E1122 Type 2 diabetes mellitus with diabetic chronic kidney disease: Secondary | ICD-10-CM | POA: Diagnosis not present

## 2019-05-25 DIAGNOSIS — E785 Hyperlipidemia, unspecified: Secondary | ICD-10-CM

## 2019-05-25 DIAGNOSIS — E119 Type 2 diabetes mellitus without complications: Secondary | ICD-10-CM

## 2019-05-25 NOTE — Patient Instructions (Addendum)
Medication Instructions:  Your physician recommends that you continue on your current medications as directed. Please refer to the Current Medication list given to you today.  *If you need a refill on your cardiac medications before your next appointment, please call your pharmacy*  Lab Work: NONE ordered at this time of appointment   If you have labs (blood work) drawn today and your tests are completely normal, you will receive your results only by: Marland Kitchen MyChart Message (if you have MyChart) OR . A paper copy in the mail If you have any lab test that is abnormal or we need to change your treatment, we will call you to review the results.  Testing/Procedures: NONE ordered at this time of appointment   Follow-Up: At [facility], you and your health needs are our priority.  As part of our continuing mission to provide you with exceptional heart care, we have created designated Provider Care Teams.  These Care Teams include your primary Cardiologist (physician) and Advanced Practice Providers (APPs -  Physician Assistants and Nurse Practitioners) who all work together to provide you with the care you need, when you need it.  Your next appointment:   1 month(s)   3-4 MONTH(S)  The format for your next appointment:   In Person   [place]  Provider:   Almyra Deforest, PA-C   Sanda Klein, MD  Other Instructions  CONTINUE to monitor blood pressure at home. Bring in your blood pressure machine to your 1 month follow up with [facility] your Nephrologist (Kidney) Physician to ask about the medication Lisinopril

## 2019-05-25 NOTE — Progress Notes (Signed)
Cardiology Office Note:    Date:  05/27/2019   ID:  Roberto Allen, DOB [birth year], MRN [id number]  PCP:  [name], MD  Cardiologist:  Sanda Klein, MD  Electrophysiologist:  None  Nephrologist: Dr. Marland Kitchen in [facility]  Referring MD: [name], MD   Chief Complaint  Patient presents with  . Hospitalization Follow-up    seen for Dr. Sallyanne Kuster    History of Present Illness:    Roberto Allen is a 59 y.o. male with a hx of CAD, COPD, CKD stage III, OSA not on CPAP due to lack of insurance, DM II, HLD, HTN, NICM and history of chronic combined systolic and diastolic heart failure.  He had a history of nonischemic cardiomyopathy dating back to 2013.  Echocardiogram obtained in March 2013 showed EF of 30 to 35%, grade 3 DD.  Cardiac catheterization showed mild nonobstructive disease in April 2013.  His ejection fraction improved to normal range 55 to 60% by December 2013.  More recently, patient was admitted in March 2021 with acute diastolic heart failure and chest pain.  He was initially treated for pneumonia, however chest x-ray suggest this is probably heart failure.  Blood pressure on arrival was greater than 200.  Apparently his nephrologist recently had tried to switch him from furosemide to torsemide.  Creatinine on arrival was 1.7.  BNP 469.9.  Troponin mildly elevated at 38.  Hemoglobin normal.  CT angiogram of the chest obtained on 04/28/2019 showed cardiomegaly with small bilateral pleural effusion, pulmonary edema, mildly enlarged mediastinal/bilateral hilar lymph nodes, coronary artery disease, however no evidence of PE or aneurysm.  Echocardiogram obtained on the same day showed EF 50 to 55%, mild LVH.  Patient also reported he had a chemical stress test at [facility] in the past several months.  His renal function worsened with IV diuresis.  ACE inhibitor was taken off due to AKI.  According to Dr. Victorino December rounding note, he did not recommend cardiac  catheterization as he did not think ejection fraction has changed when compared to the previous echocardiogram.  There was some suspicion that his volume retention could be related to nephrotic syndrome.  24-hour urine protein was found to have greater than 1503 mg protein/day (lower than 3.5g/day threshold for nephrotic syndrome).  Patient presents today for cardiology office visit.  His blood pressure seems to be very well controlled despite the fact that his home blood pressure diary indicated most of the time his blood pressure is in the 150-170s range.  There are 2 numbers on his blood pressure diary that shows his systolic blood pressure went down to as low as 104 mmHg.  He says he did switch blood pressure cuff as one was not very accurate.  I recommended him to bring his blood pressure cuff to the next office visit so we can calibrate it against the one we have in the office.  He was seen by his nephrologist yesterday and had lab work done.  For some reason, according to the patient, he has been put back on the lisinopril 40 mg daily in the past 2 weeks.  This is not shown on our medication list and lisinopril supposedly had been discontinued during the most recent hospitalization.  He says one of his doctor told him to restart it.  I asked him to double check this with his nephrologist to make sure they are okay with this.  Otherwise he has no lower extremity edema on physical exam, lungs clear.  He can follow-up in 1 month with me in 3 months with Dr. Sallyanne Kuster.   Past Medical History:  Diagnosis Date  . CAD (coronary artery disease)    a. LHC 4/13: mD1 25%, oD2 25%, pOM1 25%, pRCA 25%, dRCA 40%, EF 30%  . Chronic systolic heart failure (HCC)    a. EF 30-35% in 04/2011, improved to 55-60% in 01/2012.  Marland Kitchen CKD (chronic kidney disease), stage II   . COPD (chronic obstructive pulmonary disease) ([place])   . Diabetes mellitus   . GERD (gastroesophageal reflux disease)   . History of pneumonia   . Hx  of cardiovascular stress test    a. Nuclear (12/2012):  No ischemia, EF 59%  . Hyperkalemia    a. 12/2012 - spironolactone, KCl supp discontinued.  . Hyperlipidemia   . Hypertension   . Hypertriglyceridemia    a. 12/2012 - started on fenofibrate.  . Knee pain   . NICM (nonischemic cardiomyopathy) ([place])    a. Echo 3/13: mild LVH, EF 30-35%, grade 3 diast dysfxn, mod LAE;  RHC 4/13:  RA 8, RV 58/10, PA 56/18, mean 35, PCWP mean 17, LV 154/27, CO 4.3, CI 2.0. b. EF Improved >>> Echo (01/2012):  Mild LVH, EF 55-60%, no RWMA, Gr 1 DD    Past Surgical History:  Procedure Laterality Date  . CARDIAC CATHETERIZATION  2013   non-obs dz 25-40% multiple vessels  . TENOTOMY  2018    Current Medications: Current Meds  Medication Sig  . ACCU-CHEK FASTCLIX LANCETS MISC USE TO MONITOR BLOOD GLUCOSE 3 TIME(S) DAILY  . amLODipine (NORVASC) 5 MG tablet Take 1 tablet (5 mg total) by mouth daily.  Marland Kitchen aspirin EC 81 MG EC tablet Take 1 tablet (81 mg total) by mouth daily.  Marland Kitchen atorvastatin (LIPITOR) 10 MG tablet Take 10 mg by mouth every morning.   . BD PEN NEEDLE NANO U/F 32G X 4 MM MISC 1 EACH BY DOES NOT APPLY ROUTE 2 (TWO) TIMES DAILY.  Marland Kitchen Blood Glucose Monitoring Suppl (ACCU-CHEK GUIDE) w/Device KIT 3 TIMES A DAY. USE AS DIRECTED BY PHYSICIAN TO MONITOR BLOOD SUGARS  . furosemide (LASIX) 40 MG tablet Take 1 tablet (40 mg total) by mouth daily.  Marland Kitchen glucose blood (ONETOUCH VERIO) test strip 4-5 TIMES PER DAY  . hydrALAZINE (APRESOLINE) 25 MG tablet Take 25 mg by mouth 3 (three) times daily.   . insulin glargine (LANTUS) 100 UNIT/ML injection Inject 0.37 mLs (37 Units total) into the skin at bedtime.  . isosorbide mononitrate (IMDUR) 120 MG 24 hr tablet Take 1 tablet (120 mg total) by mouth daily.  Marland Kitchen JARDIANCE 25 MG TABS tablet Take 25 mg by mouth daily.  . magnesium oxide (MAG-OX) 400 (241.3 Mg) MG tablet Take 1 tablet (400 mg total) by mouth daily.  . metoprolol succinate (TOPROL-XL) 100 MG 24 hr tablet  Take 100 mg by mouth daily.   Marland Kitchen omeprazole (PRILOSEC) 20 MG capsule Take 20 mg by mouth daily.   . potassium chloride SA (KLOR-CON) 20 MEQ tablet Take 1 tablet (20 mEq total) by mouth daily.     Allergies:   Metformin and related   Social History   Socioeconomic History  . Marital status: Married    Spouse name: Not on file  . Number of children: 6  . Years of education: Not on file  . Highest education level: Not on file  Occupational History  . Occupation: [employer]    CommentInsurance claims handler:  [place]  Tobacco Use  . Smoking status: Former Smoker    Packs/day: 1.00    Years: 20.00    Pack years: 20.00    Types: Cigarettes    Quit date: 09/15/2006    Years since quitting: 12.7  . Smokeless tobacco: Never Used  Substance and Sexual Activity  . Alcohol use: Yes    Comment: occ  . Drug use: No  . Sexual activity: Yes  Other Topics Concern  . Not on file  Social History Narrative   Lives with wife.   Social Determinants of Health   Financial Resource Strain:   . Difficulty of Paying Living Expenses:   Food Insecurity:   . Worried About Charity fundraiser in the Last Year:   . Arboriculturist in the Last Year:   Transportation Needs:   . Film/video editor (Medical):   Marland Kitchen Lack of Transportation (Non-Medical):   Physical Activity:   . Days of Exercise per Week:   . Minutes of Exercise per Session:   Stress:   . Feeling of Stress :   Social Connections:   . Frequency of Communication with Friends and Family:   . Frequency of Social Gatherings with Friends and Family:   . Attends Religious Services:   . Active Member of Clubs or Organizations:   . Attends Archivist Meetings:   Marland Kitchen Marital Status:      Family History: The patient's family history includes Breast cancer in his maternal grandmother and mother; Diabetes in his sister.  ROS:   Please see the history of present illness.     All other systems reviewed and are  negative.  EKGs/Labs/Other Studies Reviewed:    The following studies were reviewed today:  Echo 04/28/2019 IMPRESSIONS    1. Left ventricular ejection fraction, by estimation, is 50 to 55%. The  left ventricle has low normal function. The left ventricle demonstrates  regional wall motion abnormalities (see scoring diagram/findings for  description). There is mild left  ventricular hypertrophy. Left ventricular diastolic parameters are  indeterminate.  2. Right ventricular systolic function is normal. The right ventricular  size is normal. Tricuspid regurgitation signal is inadequate for assessing  PA pressure.  3. The mitral valve is normal in structure. No evidence of mitral valve  regurgitation. No evidence of mitral stenosis.  4. The aortic valve is normal in structure. Aortic valve regurgitation is  trivial. No aortic stenosis is present.  5. The inferior vena cava is normal in size with greater than 50%  respiratory variability, suggesting right atrial pressure of 3 mmHg.   EKG:  EKG is ordered today.  The ekg ordered today demonstrates NSR with 1st degree AV block  Recent Labs: 04/27/2019: ALT 19 04/28/2019: B Natriuretic Peptide 469.9; Hemoglobin 13.1; Platelets 331; TSH 2.650 04/29/2019: Magnesium 1.7 05/01/2019: BUN 29; Creatinine, Ser 2.39; Potassium 3.7; Sodium 140  Recent Lipid Panel    Component Value Date/Time   CHOL 276 (H) 12/17/2012 0145   TRIG 1,102 (H) 12/17/2012 0145   HDL 42 12/17/2012 0145   CHOLHDL 6.6 12/17/2012 0145   VLDL UNABLE TO CALCULATE IF TRIGLYCERIDE OVER 400 mg/dL 12/17/2012 0145   LDLCALC UNABLE TO CALCULATE IF TRIGLYCERIDE OVER 400 mg/dL 12/17/2012 0145   LDLDIRECT 96.5 09/29/2011 1034    Physical Exam:    VS:  BP 130/78   Pulse 75   Temp (!) 97 F (36.1 C)   Ht _0  (1.803 m)   Wt 236  lb (107 kg)   BMI 32.92 kg/m     Wt Readings from Last 3 Encounters:  05/25/19 236 lb (107 kg)  05/01/19 222 lb 14.4 oz (101.1 kg)    11/01/13 228 lb 12.8 oz (103.8 kg)     GEN:  Well nourished, well developed in no acute distress HEENT: Normal NECK: No JVD; No carotid bruits LYMPHATICS: No lymphadenopathy CARDIAC: RRR, no murmurs, rubs, gallops RESPIRATORY:  Clear to auscultation without rales, wheezing or rhonchi  ABDOMEN: Soft, non-tender, non-distended MUSCULOSKELETAL:  No edema; No deformity  SKIN: Warm and dry NEUROLOGIC:  Alert and oriented x 3 PSYCHIATRIC:  Normal affect   ASSESSMENT:    1. Chronic diastolic heart failure ([place])   2. Coronary artery disease involving native coronary artery of native heart without angina pectoris   3. Chronic obstructive pulmonary disease, unspecified COPD type ([place])   4. 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 diabetes mellitus ([place])   5. Controlled type 2 diabetes mellitus without complication, without long-term current use of insulin ([place])   6. Hypertension secondary to other renal disorders   7. Hyperlipidemia LDL goal <70    PLAN:    In order of problems listed above:  1. Chronic diastolic heart failure: Patient has a history of nonischemic cardiomyopathy dating back to 2013.  Ejection fraction later improved.  A recent echocardiogram showed EF 50 to 55%.  Since discharge, his weight is fairly stable.  He does not appear to be volume overloaded on physical exam.  Diuretic managed by nephrologist in [facility]  2. CAD: Previous cardiac catheterization in 2013 showed minimal disease.  Denies any recent chest pain  3. COPD: No acute exacerbation  4. CKD stage III: Renal function worsened during recent hospitalization after diuresis.  Further work-up by nephrologist.  He had a significant amount of protein in 24-hour urine test, however not enough to be in the range of nephrotic syndrome.  His lisinopril has been taken off during the recent hospitalization, however since then, according to the patient he has been restarted on the lisinopril by one of his doctors.  I urged him to  double check this with his nephrologist to make sure they are okay with this as lisinopril is contraindicated in the setting of acute renal insufficiency.  5. DM2: Managed by primary care provider  6. Hypertension: Based on home blood pressure diary, his blood pressure was in the 150s, however there at least 2 readings where his systolic blood pressure went down to as low as 104.  Today his blood pressure is actually normal.  I decided to hold off on adjusting the blood pressure medication at this time  7. Hyperlipidemia: Continue Lipitor.   Medication Adjustments/Labs and Tests Ordered: Current medicines are reviewed at length with the patient today.  Concerns regarding medicines are outlined above.  Orders Placed This Encounter  Procedures  . EKG 12-Lead   No orders of the defined types were placed in this encounter.   Patient Instructions  Medication Instructions:  Your physician recommends that you continue on your current medications as directed. Please refer to the Current Medication list given to you today.  *If you need a refill on your cardiac medications before your next appointment, please call your pharmacy*  Lab Work: NONE ordered at this time of appointment   If you have labs (blood work) drawn today and your tests are completely normal, you will receive your results only by: Marland Kitchen MyChart Message (if you have MyChart) OR .  A paper copy in the mail If you have any lab test that is abnormal or we need to change your treatment, we will call you to review the results.  Testing/Procedures: NONE ordered at this time of appointment   Follow-Up: At [facility], you and your health needs are our priority.  As part of our continuing mission to provide you with exceptional heart care, we have created designated Provider Care Teams.  These Care Teams include your primary Cardiologist (physician) and Advanced Practice Providers (APPs -  Physician Assistants and Nurse  Practitioners) who all work together to provide you with the care you need, when you need it.  Your next appointment:   1 month(s)   3-4 MONTH(S)  The format for your next appointment:   In Person   [place]  Provider:   Almyra Deforest, PA-C   Sanda Klein, MD  Other Instructions  CONTINUE to monitor blood pressure at home. Bring in your blood pressure machine to your 1 month follow up with [facility] your Nephrologist (Kidney) Physician to ask about the medication Lisinopril     Signed, Almyra Deforest, [place]  05/27/2019 10:26 PM    [facility]

## 2019-05-27 ENCOUNTER — Encounter: Payer: Self-pay | Admitting: Physician Assistant

## 2019-07-05 ENCOUNTER — Ambulatory Visit (INDEPENDENT_AMBULATORY_CARE_PROVIDER_SITE_OTHER): Payer: BC Managed Care – PPO | Admitting: Podiatry

## 2019-07-05 ENCOUNTER — Other Ambulatory Visit: Payer: Self-pay

## 2019-07-05 DIAGNOSIS — L84 Corns and callosities: Secondary | ICD-10-CM | POA: Diagnosis not present

## 2019-07-05 DIAGNOSIS — E1149 Type 2 diabetes mellitus with other diabetic neurological complication: Secondary | ICD-10-CM | POA: Diagnosis not present

## 2019-07-05 NOTE — Patient Instructions (Signed)
Diabetes Mellitus and Foot Care Foot care is an important part of your health, especially when you have diabetes. Diabetes may cause you to have problems because of poor blood flow (circulation) to your feet and legs, which can cause your skin to:  Become thinner and drier.  Break more easily.  Heal more slowly.  Peel and crack. You may also have nerve damage (neuropathy) in your legs and feet, causing decreased feeling in them. This means that you may not notice minor injuries to your feet that could lead to more serious problems. Noticing and addressing any potential problems early is the best way to prevent future foot problems. How to care for your feet Foot hygiene  Wash your feet daily with warm water and mild soap. Do not use hot water. Then, pat your feet and the areas between your toes until they are completely dry. Do not soak your feet as this can dry your skin.  Trim your toenails straight across. Do not dig under them or around the cuticle. File the edges of your nails with an emery board or nail file.  Apply a moisturizing lotion or petroleum jelly to the skin on your feet and to dry, brittle toenails. Use lotion that does not contain alcohol and is unscented. Do not apply lotion between your toes. Shoes and socks  Wear clean socks or stockings every day. Make sure they are not too tight. Do not wear knee-high stockings since they may decrease blood flow to your legs.  Wear shoes that fit properly and have enough cushioning. Always look in your shoes before you put them on to be sure there are no objects inside.  To break in new shoes, wear them for just a few hours a day. This prevents injuries on your feet. Wounds, scrapes, corns, and calluses  Check your feet daily for blisters, cuts, bruises, sores, and redness. If you cannot see the bottom of your feet, use a mirror or ask someone for help.  Do not cut corns or calluses or try to remove them with medicine.  If you  find a minor scrape, cut, or break in the skin on your feet, keep it and the skin around it clean and dry. You may clean these areas with mild soap and water. Do not clean the area with peroxide, alcohol, or iodine.  If you have a wound, scrape, corn, or callus on your foot, look at it several times a day to make sure it is healing and not infected. Check for: ? Redness, swelling, or pain. ? Fluid or blood. ? Warmth. ? Pus or a bad smell. General instructions  Do not cross your legs. This may decrease blood flow to your feet.  Do not use heating pads or hot water bottles on your feet. They may burn your skin. If you have lost feeling in your feet or legs, you may not know this is happening until it is too late.  Protect your feet from hot and cold by wearing shoes, such as at the beach or on hot pavement.  Schedule a complete foot exam at least once a year (annually) or more often if you have foot problems. If you have foot problems, report any cuts, sores, or bruises to your health care provider immediately. Contact a health care provider if:  You have a medical condition that increases your risk of infection and you have any cuts, sores, or bruises on your feet.  You have an injury that is not   healing.  You have redness on your legs or feet.  You feel burning or tingling in your legs or feet.  You have pain or cramps in your legs and feet.  Your legs or feet are numb.  Your feet always feel cold.  You have pain around a toenail. Get help right away if:  You have a wound, scrape, corn, or callus on your foot and: ? You have pain, swelling, or redness that gets worse. ? You have fluid or blood coming from the wound, scrape, corn, or callus. ? Your wound, scrape, corn, or callus feels warm to the touch. ? You have pus or a bad smell coming from the wound, scrape, corn, or callus. ? You have a fever. ? You have a red line going up your leg. Summary  Check your feet every day  for cuts, sores, red spots, swelling, and blisters.  Moisturize feet and legs daily.  Wear shoes that fit properly and have enough cushioning.  If you have foot problems, report any cuts, sores, or bruises to your health care provider immediately.  Schedule a complete foot exam at least once a year (annually) or more often if you have foot problems. This information is not intended to replace advice given to you by your health care provider. Make sure you discuss any questions you have with your health care provider. Document Revised: 10/25/2018 Document Reviewed: 03/05/2016 Elsevier Patient Education  2020 Elsevier Inc.  

## 2019-07-06 ENCOUNTER — Encounter: Payer: Self-pay | Admitting: Physician Assistant

## 2019-07-06 ENCOUNTER — Other Ambulatory Visit: Payer: Self-pay

## 2019-07-06 ENCOUNTER — Ambulatory Visit (INDEPENDENT_AMBULATORY_CARE_PROVIDER_SITE_OTHER): Payer: BC Managed Care – PPO | Admitting: Physician Assistant

## 2019-07-06 VITALS — BP 118/64 | HR 72 | Temp 97.5°F | Ht 71.0 in | Wt 239.0 lb

## 2019-07-06 DIAGNOSIS — I10 Essential (primary) hypertension: Secondary | ICD-10-CM

## 2019-07-06 DIAGNOSIS — J449 Chronic obstructive pulmonary disease, unspecified: Secondary | ICD-10-CM

## 2019-07-06 DIAGNOSIS — E785 Hyperlipidemia, unspecified: Secondary | ICD-10-CM

## 2019-07-06 DIAGNOSIS — E119 Type 2 diabetes mellitus without complications: Secondary | ICD-10-CM

## 2019-07-06 DIAGNOSIS — N184 Chronic kidney disease, stage 4 (severe): Secondary | ICD-10-CM

## 2019-07-06 DIAGNOSIS — N179 Acute kidney failure, unspecified: Secondary | ICD-10-CM

## 2019-07-06 DIAGNOSIS — I2510 Atherosclerotic heart disease of native coronary artery without angina pectoris: Secondary | ICD-10-CM | POA: Diagnosis not present

## 2019-07-06 DIAGNOSIS — I428 Other cardiomyopathies: Secondary | ICD-10-CM

## 2019-07-06 IMAGING — DX DG CHEST 2V
2 series · 2 of 2 positions shown · non-contrast
Comparison: 12/16/2012

CLINICAL DATA: Shortness of breath.

EXAM:
CHEST - 2 VIEW

[w chest pa]
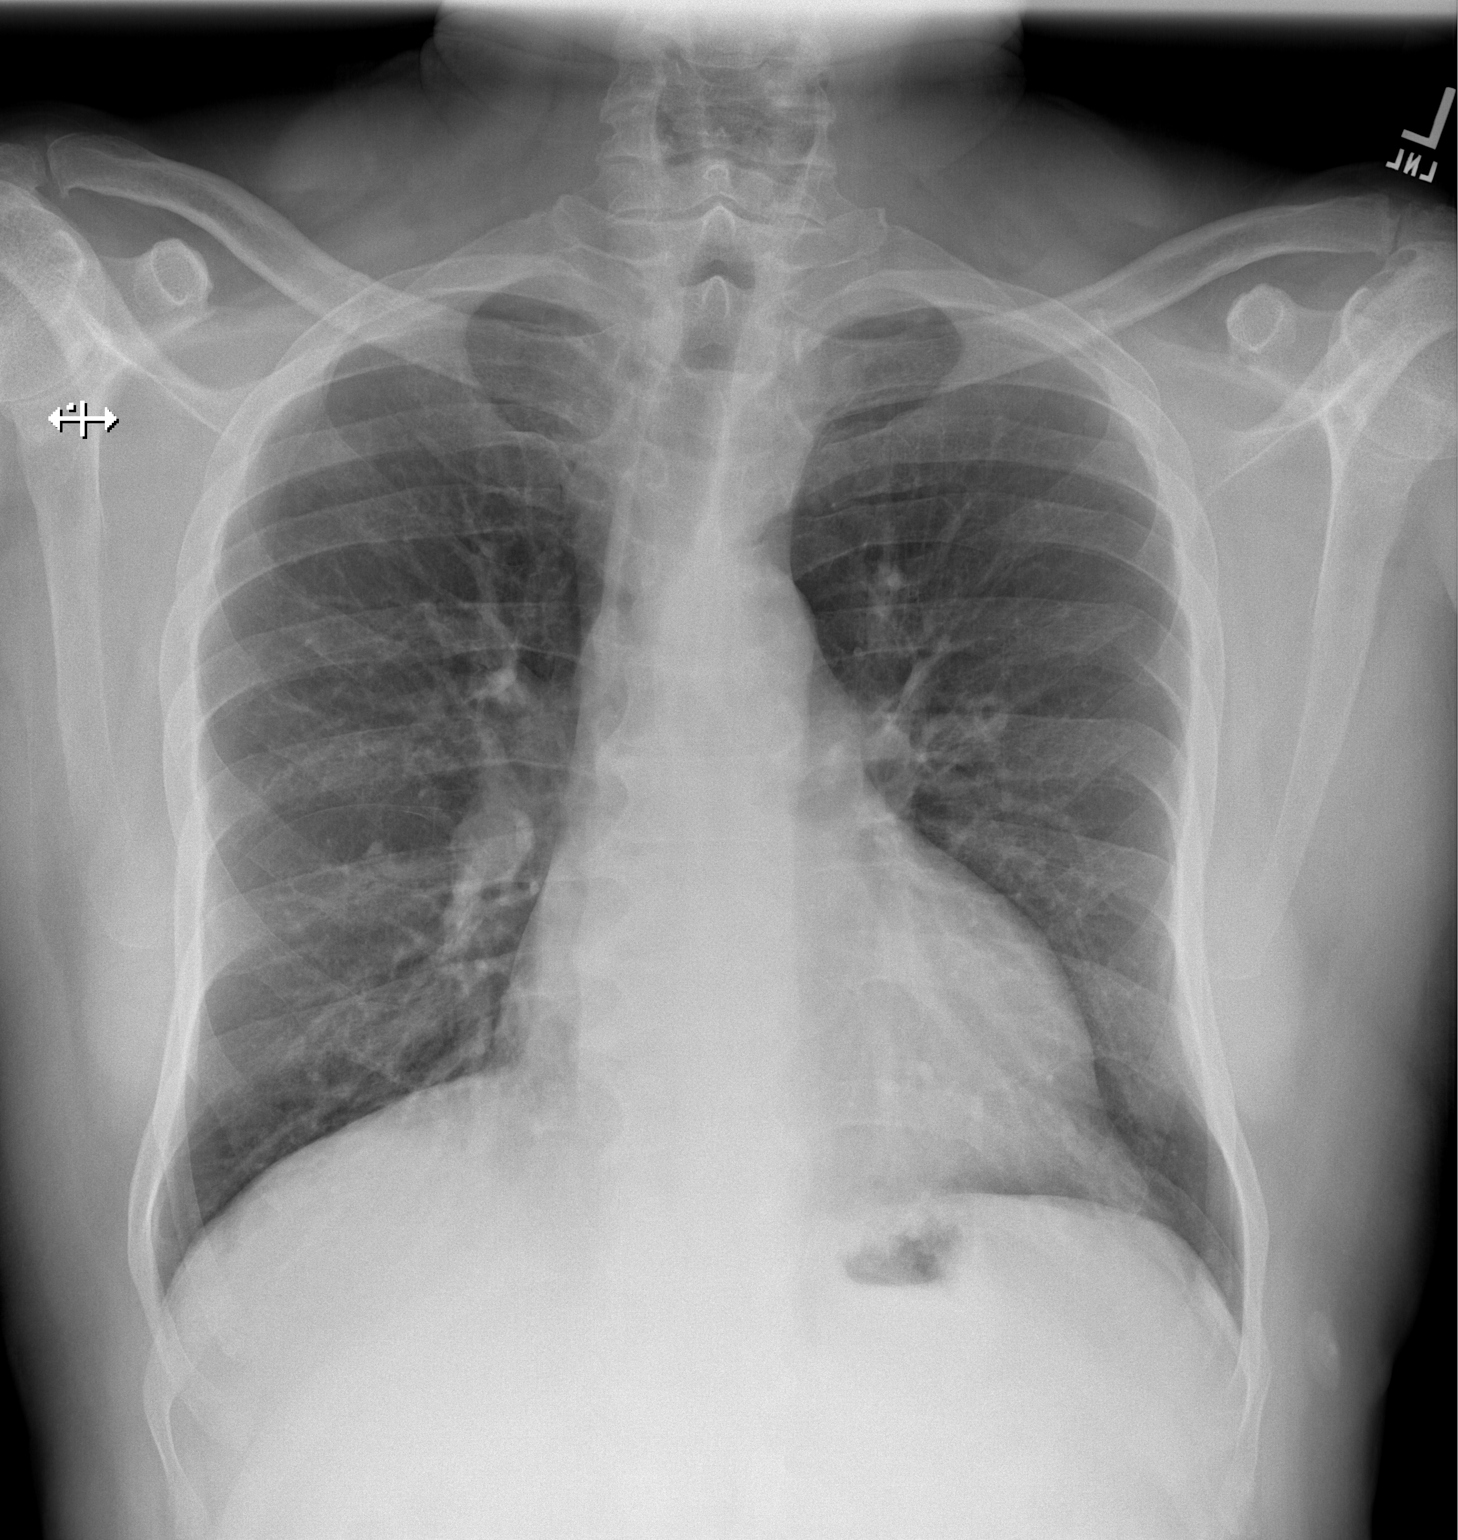

[w chest lat]
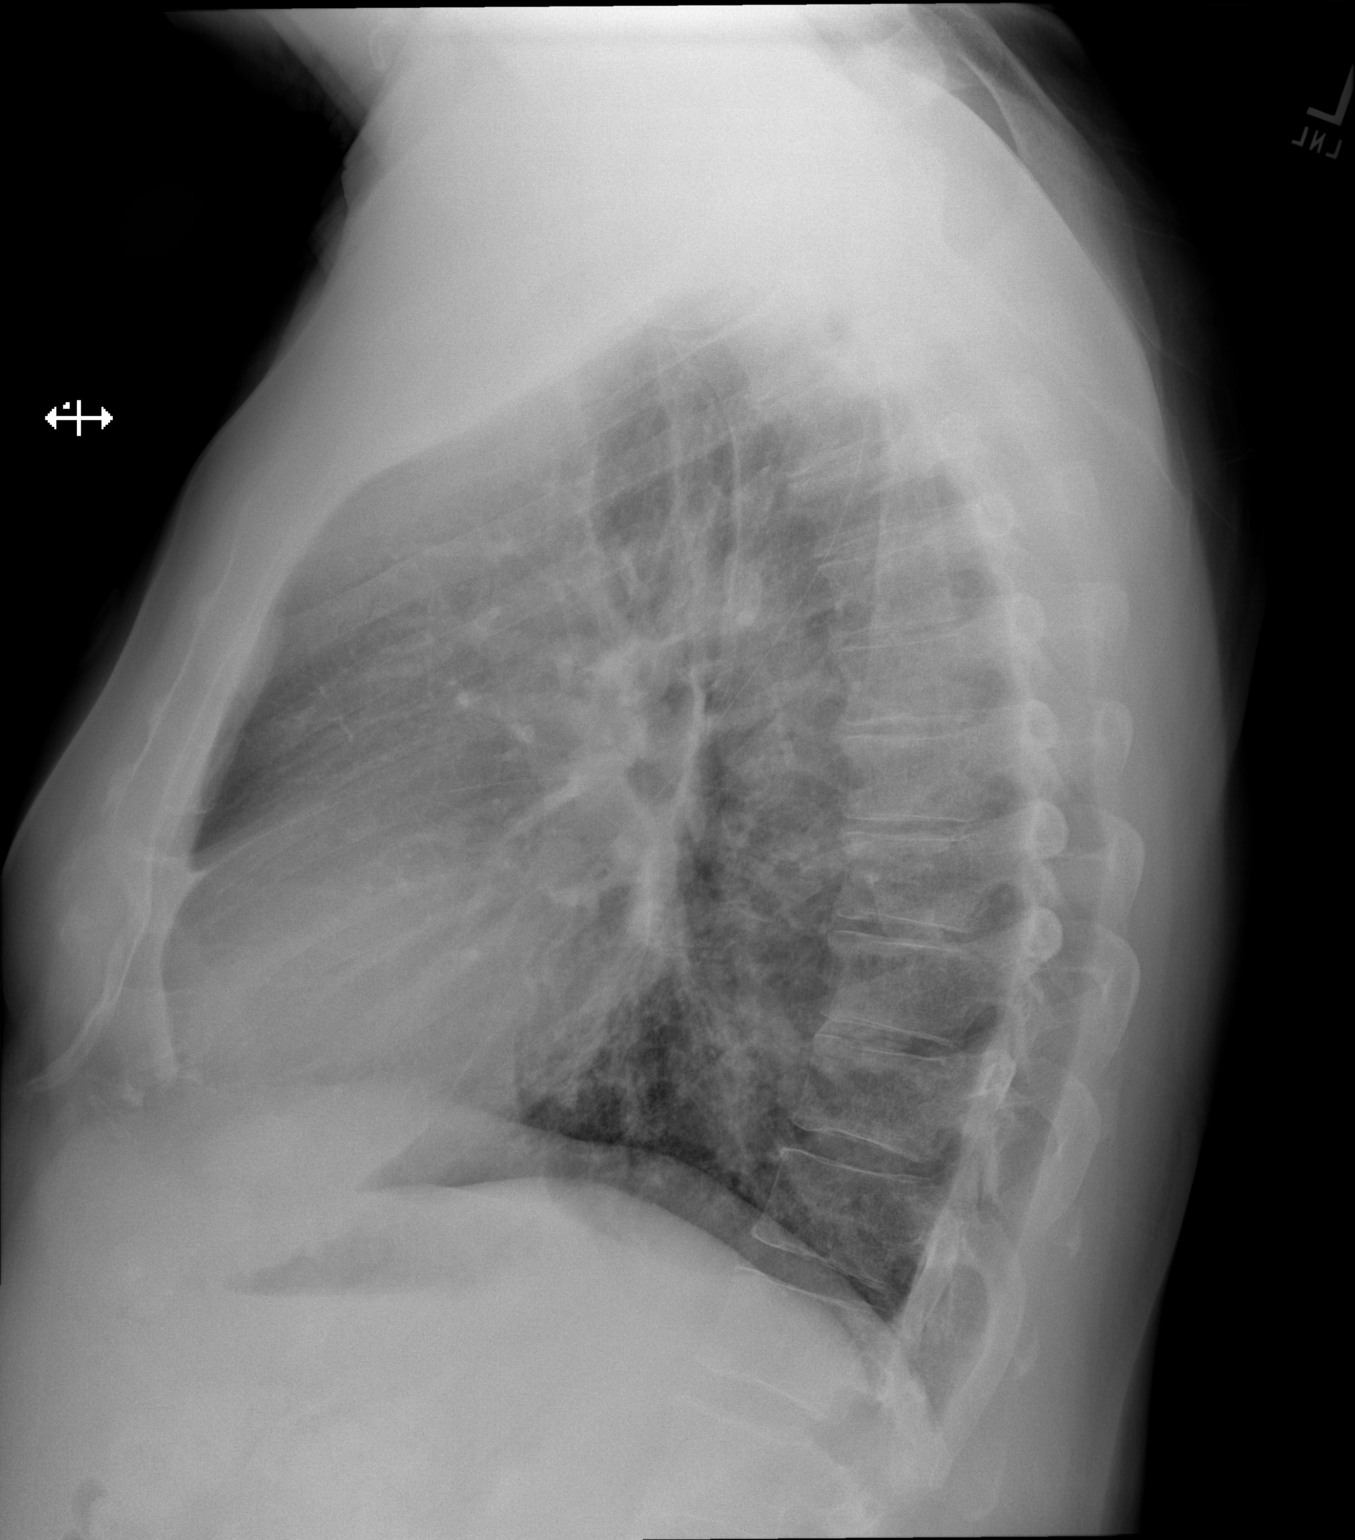

[2 of 2 positions shown; findings below may reference images not displayed]

FINDINGS: Mild cardiomegaly. Pulmonary vascularity is normal. Lungs are clear.
No effusions. No significant bone abnormality.
IMPRESSION: New borderline cardiomegaly.  Otherwise, essentially normal exam.

## 2019-07-06 NOTE — Patient Instructions (Addendum)
Medication Instructions:  Your physician recommends that you continue on your current medications as directed. Please refer to the Current Medication list given to you today.  *If you need a refill on your cardiac medications before your next appointment, please call your pharmacy*  Lab Work: NONE ordered at this time of appointment   If you have labs (blood work) drawn today and your tests are completely normal, you will receive your results only by: . MyChart Message (if you have MyChart) OR . A paper copy in the mail If you have any lab test that is abnormal or we need to change your treatment, we will call you to review the results.  Testing/Procedures: NONE ordered at this time of appointment   Follow-Up: At [facility], you and your health needs are our priority.  As part of our continuing mission to provide you with exceptional heart care, we have created designated Provider Care Teams.  These Care Teams include your primary Cardiologist (physician) and Advanced Practice Providers (APPs -  Physician Assistants and Nurse Practitioners) who all work together to provide you with the care you need, when you need it.   Your next appointment:   2-3 month(s)  The format for your next appointment:   In Person  Provider:   Mihai Croitoru, MD  Other Instructions    

## 2019-07-06 NOTE — Progress Notes (Signed)
Cardiology Office Note:    Date:  07/08/2019   ID:  Leevon Upperman, DOB 1960-11-03, MRN [id number]  PCP:  [name], MD  Cardiologist:  Sanda Klein, MD  Electrophysiologist:  None  Nephrologist: Dr. Marland Kitchen in [facility]  Referring MD: [name], MD   Chief Complaint  Patient presents with  . Follow-up    seen for Dr. Sallyanne Kuster    History of Present Illness:    Roberto Allen is a 59 y.o. male with a hx of CAD, COPD, CKD stage III, OSA not on CPAP due to lack of insurance, DM II, HLD, HTN, NICM and history of chronic combined systolic and diastolic heart failure.  He had a history of nonischemic cardiomyopathy dating back to 2013.  Echocardiogram obtained in March 2013 showed EF of 30 to 35%, grade 3 DD.  Cardiac catheterization showed mild nonobstructive disease in April 2013.  His ejection fraction improved to normal range 55 to 60% by December 2013.  More recently, patient was admitted in March 2021 with acute diastolic heart failure and chest pain.  He was initially treated for pneumonia, however chest x-ray suggest this is probably heart failure.  Blood pressure on arrival was >200.  Apparently his nephrologist recently had tried to switch him from furosemide to torsemide.  Creatinine on arrival was 1.7.  BNP 469.9.  Troponin mildly elevated at 38.  Hemoglobin normal.  CT angiogram of the chest obtained on 04/28/2019 showed cardiomegaly with small bilateral pleural effusion, pulmonary edema, mildly enlarged mediastinal/bilateral hilar lymph nodes, coronary artery disease, however no evidence of PE or aneurysm.  Echocardiogram obtained on the same day showed EF 50 to 55%, mild LVH.  Patient also reported he had a chemical stress test at [facility] in the past several months.  His renal function worsened with IV diuresis.  ACE inhibitor was taken off due to AKI.  According to Dr. Victorino December rounding note, he did not recommend cardiac catheterization as he did not  think ejection fraction has changed when compared to the previous echocardiogram.  There was some suspicion that his volume retention could be related to nephrotic syndrome.  24-hour urine protein was found to have greater than 1503 mg protein/day (lower than 3.5g/day threshold for nephrotic syndrome).  I last saw the patient on 05/25/2019, at which time, his blood pressure was elevated at home however normally in the office.  He also mentions one of his physician restarted his lisinopril.  Because of worsening renal function prior to discharge, I asked him to discuss this with Dr. Marland Kitchen his nephrologist in [facility] prior to continuing on the lisinopril.  I talked to the patient today, he says he probably was mistaken and was not taking the lisinopril.  His blood pressure remains very well controlled on the current therapy.  If blood pressure does drop further, we can reduce his amlodipine.  Otherwise he should continue on the hydralazine/nitrate and metoprolol succinate combination given history of LV dysfunction.  He should also avoid any nephrotoxic agent.  He can follow-up with Dr. Sallyanne Kuster in the next 3 months.  Past Medical History:  Diagnosis Date  . CAD (coronary artery disease)    a. LHC 4/13: mD1 25%, oD2 25%, pOM1 25%, pRCA 25%, dRCA 40%, EF 30%  . Chronic systolic heart failure (HCC)    a. EF 30-35% in 04/2011, improved to 55-60% in 01/2012.  Marland Kitchen CKD (chronic kidney disease), stage II   . COPD (chronic obstructive pulmonary disease) ([place])   .  Diabetes mellitus   . GERD (gastroesophageal reflux disease)   . History of pneumonia   . Hx of cardiovascular stress test    a. Nuclear (12/2012):  No ischemia, EF 59%  . Hyperkalemia    a. 12/2012 - spironolactone, KCl supp discontinued.  . Hyperlipidemia   . Hypertension   . Hypertriglyceridemia    a. 12/2012 - started on fenofibrate.  . Knee pain   . NICM (nonischemic cardiomyopathy) ([place])    a. Echo 3/13: mild LVH, EF 30-35%, grade 3  diast dysfxn, mod LAE;  RHC 4/13:  RA 8, RV 58/10, PA 56/18, mean 35, PCWP mean 17, LV 154/27, CO 4.3, CI 2.0. b. EF Improved >>> Echo (01/2012):  Mild LVH, EF 55-60%, no RWMA, Gr 1 DD    Past Surgical History:  Procedure Laterality Date  . CARDIAC CATHETERIZATION  2013   non-obs dz 25-40% multiple vessels  . TENOTOMY  2018    Current Medications: Current Meds  Medication Sig  . ACCU-CHEK FASTCLIX LANCETS MISC USE TO MONITOR BLOOD GLUCOSE 3 TIME(S) DAILY  . aMILoride (MIDAMOR) 5 MG tablet Take 5 mg by mouth daily.  Marland Kitchen amLODipine (NORVASC) 5 MG tablet Take 1 tablet (5 mg total) by mouth daily.  Marland Kitchen aspirin EC 81 MG EC tablet Take 1 tablet (81 mg total) by mouth daily.  Marland Kitchen atorvastatin (LIPITOR) 10 MG tablet Take 10 mg by mouth every morning.   . BD PEN NEEDLE NANO U/F 32G X 4 MM MISC 1 EACH BY DOES NOT APPLY ROUTE 2 (TWO) TIMES DAILY.  Marland Kitchen Blood Glucose Monitoring Suppl (ACCU-CHEK GUIDE) w/Device KIT 3 TIMES A DAY. USE AS DIRECTED BY PHYSICIAN TO MONITOR BLOOD SUGARS  . cholecalciferol (VITAMIN D) 25 MCG (1000 UNIT) tablet Take 1,000 Units by mouth daily.  . ergocalciferol (VITAMIN D2) 1.25 MG (50000 UT) capsule Take by mouth.  . furosemide (LASIX) 40 MG tablet Take 1 tablet (40 mg total) by mouth daily.  Marland Kitchen glucose blood (ONETOUCH VERIO) test strip 4-5 TIMES PER DAY  . HUMALOG KWIKPEN 100 UNIT/ML KwikPen SMARTSIG:15 Unit(s) SUB-Q 3 Times Daily PRN  . hydrALAZINE (APRESOLINE) 25 MG tablet Take 25 mg by mouth 3 (three) times daily.   . insulin glargine (LANTUS) 100 UNIT/ML injection Inject 0.37 mLs (37 Units total) into the skin at bedtime.  . isosorbide mononitrate (IMDUR) 120 MG 24 hr tablet Take 1 tablet (120 mg total) by mouth daily.  Marland Kitchen JARDIANCE 25 MG TABS tablet Take 25 mg by mouth daily.  . magnesium oxide (MAG-OX) 400 (241.3 Mg) MG tablet Take 1 tablet (400 mg total) by mouth daily.  . magnesium oxide (MAG-OX) 400 MG tablet Take by mouth.  . meloxicam (MOBIC) 15 MG tablet Take by mouth.   . metoprolol succinate (TOPROL-XL) 100 MG 24 hr tablet Take 100 mg by mouth daily.   Marland Kitchen omeprazole (PRILOSEC) 20 MG capsule Take 20 mg by mouth daily.   Marland Kitchen oxycodone-acetaminophen (PERCOCET) 2.5-325 MG tablet Take 1 tablet by mouth 2 (two) times daily as needed.  Marland Kitchen OZEMPIC, 1 MG/DOSE, 2 MG/1.5ML SOPN Inject 1 mg into the skin once a week.  . potassium chloride SA (KLOR-CON) 20 MEQ tablet Take 1 tablet (20 mEq total) by mouth daily.  . tadalafil (CIALIS) 20 MG tablet Take by mouth.     Allergies:   Metformin and related   Social History   Socioeconomic History  . Marital status: Married    Spouse name: Not on file  . Number of  children: 6  . Years of education: Not on file  . Highest education level: Not on file  Occupational History  . Occupation: [employer]    CommentInsurance claims handler: [place]  Tobacco Use  . Smoking status: Former Smoker    Packs/day: 1.00    Years: 20.00    Pack years: 20.00    Types: Cigarettes    Quit date: 09/15/2006    Years since quitting: 12.8  . Smokeless tobacco: Never Used  Substance and Sexual Activity  . Alcohol use: Yes    Comment: occ  . Drug use: No  . Sexual activity: Yes  Other Topics Concern  . Not on file  Social History Narrative   Lives with wife.   Social Determinants of Health   Financial Resource Strain:   . Difficulty of Paying Living Expenses:   Food Insecurity:   . Worried About Charity fundraiser in the Last Year:   . Arboriculturist in the Last Year:   Transportation Needs:   . Film/video editor (Medical):   Marland Kitchen Lack of Transportation (Non-Medical):   Physical Activity:   . Days of Exercise per Week:   . Minutes of Exercise per Session:   Stress:   . Feeling of Stress :   Social Connections:   . Frequency of Communication with Friends and Family:   . Frequency of Social Gatherings with Friends and Family:   . Attends Religious Services:   . Active Member of Clubs or Organizations:   .  Attends Archivist Meetings:   Marland Kitchen Marital Status:      Family History: The patient's family history includes Breast cancer in his maternal grandmother and mother; Diabetes in his sister.  ROS:   Please see the history of present illness.     All other systems reviewed and are negative.  EKGs/Labs/Other Studies Reviewed:    The following studies were reviewed today:  Echo 04/28/2019 1. Left ventricular ejection fraction, by estimation, is 50 to 55%. The  left ventricle has low normal function. The left ventricle demonstrates  regional wall motion abnormalities (see scoring diagram/findings for  description). There is mild left  ventricular hypertrophy. Left ventricular diastolic parameters are  indeterminate.  2. Right ventricular systolic function is normal. The right ventricular  size is normal. Tricuspid regurgitation signal is inadequate for assessing  PA pressure.  3. The mitral valve is normal in structure. No evidence of mitral valve  regurgitation. No evidence of mitral stenosis.  4. The aortic valve is normal in structure. Aortic valve regurgitation is  trivial. No aortic stenosis is present.  5. The inferior vena cava is normal in size with greater than 50%  respiratory variability, suggesting right atrial pressure of 3 mmHg.   EKG:  EKG is not ordered today.    Recent Labs: 04/27/2019: ALT 19 04/28/2019: B Natriuretic Peptide 469.9; Hemoglobin 13.1; Platelets 331; TSH 2.650 04/29/2019: Magnesium 1.7 05/01/2019: BUN 29; Creatinine, Ser 2.39; Potassium 3.7; Sodium 140  Recent Lipid Panel    Component Value Date/Time   CHOL 276 (H) 12/17/2012 0145   TRIG 1,102 (H) 12/17/2012 0145   HDL 42 12/17/2012 0145   CHOLHDL 6.6 12/17/2012 0145   VLDL UNABLE TO CALCULATE IF TRIGLYCERIDE OVER 400 mg/dL 12/17/2012 0145   LDLCALC UNABLE TO CALCULATE IF TRIGLYCERIDE OVER 400 mg/dL 12/17/2012 0145   LDLDIRECT 96.5 09/29/2011 1034    Physical Exam:    VS:  BP 118/64  Pulse 72   Temp (!) 97.5 F (36.4 C)   Ht _0  (1.803 m)   Wt 239 lb (108.4 kg)   SpO2 95%   BMI 33.33 kg/m     Wt Readings from Last 3 Encounters:  07/06/19 239 lb (108.4 kg)  05/25/19 236 lb (107 kg)  05/01/19 222 lb 14.4 oz (101.1 kg)     GEN:  Well nourished, well developed in no acute distress HEENT: Normal NECK: No JVD; No carotid bruits LYMPHATICS: No lymphadenopathy CARDIAC: RRR, no murmurs, rubs, gallops RESPIRATORY:  Clear to auscultation without rales, wheezing or rhonchi  ABDOMEN: Soft, non-tender, non-distended MUSCULOSKELETAL:  No edema; No deformity  SKIN: Warm and dry NEUROLOGIC:  Alert and oriented x 3 PSYCHIATRIC:  Normal affect   ASSESSMENT:    1. Coronary artery disease involving native coronary artery of native heart without angina pectoris   2. Chronic obstructive pulmonary disease, unspecified COPD type ([place])   3. 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 diabetes mellitus ([place])   4. Essential hypertension   5. Hyperlipidemia LDL goal <70   6. Controlled type 2 diabetes mellitus without complication, without long-term current use of insulin ([place])   7. NICM (nonischemic cardiomyopathy) ([place])    PLAN:    In order of problems listed above:  1. CAD: Previous cardiac catheterization in 2013 showed only mild disease.  2. COPD: No recent exacerbation  3. Acute on chronic renal insufficiency: Followed by Dr. Marland Kitchen in [facility]  Even though he previously mentioned while his physician has restarted him on the lisinopril, today he said he was probably a mistake and was not taking the lisinopril.  4. Hypertension: Continue on current therapy  5. Hyperlipidemia: On Lipitor  6. DM2: Managed by primary care provider  7. History of nonischemic cardiomyopathy: Ejection fraction has improved on the recent echocardiogram.  Continue on current heart failure therapy.   Medication Adjustments/Labs and Tests Ordered: Current medicines are reviewed at length with the  patient today.  Concerns regarding medicines are outlined above.  No orders of the defined types were placed in this encounter.  No orders of the defined types were placed in this encounter.   Patient Instructions  Medication Instructions:  Your physician recommends that you continue on your current medications as directed. Please refer to the Current Medication list given to you today.  *If you need a refill on your cardiac medications before your next appointment, please call your pharmacy*  Lab Work: NONE ordered at this time of appointment   If you have labs (blood work) drawn today and your tests are completely normal, you will receive your results only by: Marland Kitchen MyChart Message (if you have MyChart) OR . A paper copy in the mail If you have any lab test that is abnormal or we need to change your treatment, we will call you to review the results.  Testing/Procedures: NONE ordered at this time of appointment   Follow-Up: At [facility], you and your health needs are our priority.  As part of our continuing mission to provide you with exceptional heart care, we have created designated Provider Care Teams.  These Care Teams include your primary Cardiologist (physician) and Advanced Practice Providers (APPs -  Physician Assistants and Nurse Practitioners) who all work together to provide you with the care you need, when you need it.  Your next appointment:   2-3 month(s)  The format for your next appointment:   In Person  Provider:  Sanda Klein, MD  Other Instructions      Signed, Almyra Deforest, [place]  07/08/2019 11:09 PM    [place]

## 2019-07-06 NOTE — Progress Notes (Signed)
Subjective: 59 year old male presents the office today for diabetic foot evaluation.  He denies any open sores.  His wife continues to get moisturizer on the calluses daily and has not noticed any new areas of skin breakdown.  No swelling or redness. Denies any systemic complaints such as fevers, chills, nausea, vomiting. No acute changes since last appointment, and no other complaints at this time.   Objective: AAO x3, NAD DP/PT pulses palpable bilaterally, CRT less than 3 seconds Sensation decreased Semmes-Weinstein monofilament Nails are mildly hypertrophic, dystrophic.  There is no pain in the nails no redness or drainage or any swelling.  Evidence of previous callus on the bilateral foot submetatarsal 1.  No open sores. No pain with calf compression, swelling, warmth, erythema  Assessment: Diabetic foot evaluation; calluses   Plan: -All treatment options discussed with the patient including all alternatives, risks, complications.  -Debrided the hyperkeratotic lesions x2 without any complications or bleeding. -I did smooth the nails today without any complications or bleeding. Continue to check feet daily limit others any issues.  Otherwise I will see him back in 6 months. -Patient encouraged to call the office with any questions, concerns, change in symptoms.   [name] DPM

## 2019-07-08 ENCOUNTER — Encounter: Payer: Self-pay | Admitting: Physician Assistant

## 2019-07-21 NOTE — Progress Notes (Signed)
Thanks

## 2019-08-03 ENCOUNTER — Other Ambulatory Visit: Payer: Self-pay

## 2019-08-03 ENCOUNTER — Ambulatory Visit (INDEPENDENT_AMBULATORY_CARE_PROVIDER_SITE_OTHER): Payer: BC Managed Care – PPO | Admitting: Family Medicine

## 2019-08-03 ENCOUNTER — Encounter: Payer: Self-pay | Admitting: Family Medicine

## 2019-08-03 VITALS — BP 124/82 | HR 74 | Temp 97.8°F | Ht 71.0 in | Wt 239.0 lb

## 2019-08-03 DIAGNOSIS — G4762 Sleep related leg cramps: Secondary | ICD-10-CM

## 2019-08-03 DIAGNOSIS — E669 Obesity, unspecified: Secondary | ICD-10-CM | POA: Diagnosis not present

## 2019-08-03 DIAGNOSIS — Z7689 Persons encountering health services in other specified circumstances: Secondary | ICD-10-CM

## 2019-08-03 DIAGNOSIS — E1121 Type 2 diabetes mellitus with diabetic nephropathy: Secondary | ICD-10-CM

## 2019-08-03 DIAGNOSIS — N1832 Chronic kidney disease, stage 3b: Secondary | ICD-10-CM | POA: Diagnosis not present

## 2019-08-03 DIAGNOSIS — I10 Essential (primary) hypertension: Secondary | ICD-10-CM

## 2019-08-03 LAB — POCT GLYCOSYLATED HEMOGLOBIN (HGB A1C): Hemoglobin A1C: 7.5 % — AB (ref 4.0–5.6)

## 2019-08-03 NOTE — Patient Instructions (Addendum)
Good to see you today  Please follow up in 3 months for your Complete physical exam  Get signed up with your insurance diabetes program  Stop soda, sweet tea, juice. Drink water, sparking water, unsweetened tea/ coffee.   A website I like- [email or web address]  For leg cramps- try stretching at bedtime, mustard for attacks, good water intake  Blood sugar a little high- I think you will get a big improvement with cutting out soda/ juice, watch carbs more closely. EAt more non starchy veggies, proteins

## 2019-08-03 NOTE — Progress Notes (Signed)
Subjective:    Patient ID: Roberto Allen, male    DOB: 1960-07-16, 60 y.o.   MRN: [id number]  HPI Chief Complaint  Patient presents with  . New Patient (Initial Visit)    leg cramps at night - has tried pickle juice  Was previously seen at [facility]. Married, works at [employer]. Married x 2 years. Enjoys bowling.   Last CPE- unknown PSA- ? [place] [facility]- unknown Tdap- 02/2009 Flu- annual Dental- regular prior to covid, overdue Eye- 11/20 Exercise- none Diet- feels like he knows what to eat, wife knows. Eats 2 meals a day, drinks sweet tea and juice daily. Little fast food or restaurant food. Brings lunch to work- sausage, egg, wheat bread, no veggies, or lunch at home. Dinner- chicken, fish, vegetables, starch.   Diabetes type 2- Ozempic- 1x/ week, Humalog- SSI, takes before high carb food, 4-5 times a week, Jardiance 25 mg, Lantus- 45 units at bedtime. Blood sugars every morning- rarely over 200, usually 120s- 160s. Does not check at other times. Has a program to sign up for at work, unsure of name.   CKD- recently saw Dr. Dione Plover- has follow up in 3 months for labs, 6 months for visit.   Leg cramps- every night, most nights, no improvement with pickle juice. Worse after being on his feet more. Takes magnesium supplement in am.    Sees podiatry, cardiology, opthalmology  Review of Systems No chest pain, SOB with bending over, not with activities, no abdominal pain, diarrhea, constipation, + urinary frequency, no leg swelling.     Objective:   Physical Exam Physical Exam  Constitutional: Oriented to person, place, and time. He appears well-developed and well-nourished.  HENT:  Head: Normocephalic and atraumatic.  Eyes: Conjunctivae are normal.  Neck: Normal range of motion. Neck supple.  Cardiovascular: Normal rate, regular rhythm and normal heart sounds.   Pulmonary/Chest: Effort normal and breath sounds normal.  Musculoskeletal: 1+ bilateral LE edema.    Neurological: Alert and oriented to person, place, and time.  Skin: Skin is warm and dry.  Psychiatric: Normal mood and affect. Behavior is normal. Judgment and thought content normal.  Vitals reviewed.     BP 124/82 (BP Location: Left Arm, Patient Position: Sitting, Cuff Size: Normal)   Pulse 74   Temp 97.8 F (36.6 C) (Temporal)   Ht 5\' 11"  (1.803 m)   Wt 239 lb (108.4 kg)   SpO2 94%   BMI 33.33 kg/m  Results for orders placed or performed in visit on 08/03/19  HgB A1c  Result Value Ref Range   Hemoglobin A1C 7.5 (A) 4.0 - 5.6 %   HbA1c POC (<> result, manual entry)     HbA1c, POC (prediabetic range)     HbA1c, POC (controlled diabetic range)         Assessment & Plan:  1. Encounter to establish care - reviewed available EMR, will request records from his prior PCP, nephrology  2. Type 2 diabetes mellitus with stage 3b chronic kidney disease, without long-term current use of insulin (HCC) - today with HgBa1c 7.5, discussed results with patient, not at goal. Strongly encouraged him to stop drinking all drinks with sugar, decrease simple carbs, increase walking on days he isn't working.  - HgB A1c  3. Obesity, Class I, BMI 30-34.9 - discussed his diet, he does not think he needs additional diabetes education - follow up in 3 months  4. Essential hypertension - blood pressure ok on current meds, continued follow up with  cardiology and nephrology  5. Leg cramps, sleep related - already on magnesium, had recent labs at nephrology - discussed water intake, stretching  This visit occurred during the SARS-CoV-2 public health emergency.  Safety protocols were in place, including screening questions prior to the visit, additional usage of staff PPE, and extensive cleaning of exam room while observing appropriate contact time as indicated for disinfecting solutions.      Clarene Reamer, FNP-BC  [facility] at [facility], [facility]  08/03/2019  1:40 PM

## 2019-10-08 ENCOUNTER — Other Ambulatory Visit: Payer: Self-pay | Admitting: Family Medicine

## 2019-10-08 DIAGNOSIS — Z125 Encounter for screening for malignant neoplasm of prostate: Secondary | ICD-10-CM

## 2019-10-08 DIAGNOSIS — N1832 Chronic kidney disease, stage 3b: Secondary | ICD-10-CM

## 2019-10-08 DIAGNOSIS — E1122 Type 2 diabetes mellitus with diabetic chronic kidney disease: Secondary | ICD-10-CM

## 2019-10-10 ENCOUNTER — Ambulatory Visit: Payer: BC Managed Care – PPO | Admitting: Cardiovascular Disease

## 2019-10-17 ENCOUNTER — Other Ambulatory Visit: Payer: BC Managed Care – PPO

## 2019-10-24 ENCOUNTER — Encounter: Payer: BC Managed Care – PPO | Admitting: Family Medicine

## 2019-11-02 ENCOUNTER — Encounter: Payer: Self-pay | Admitting: Family Medicine

## 2019-11-02 ENCOUNTER — Other Ambulatory Visit: Payer: Self-pay | Admitting: Family Medicine

## 2019-11-02 DIAGNOSIS — N1832 Chronic kidney disease, stage 3b: Secondary | ICD-10-CM

## 2019-11-02 DIAGNOSIS — E1122 Type 2 diabetes mellitus with diabetic chronic kidney disease: Secondary | ICD-10-CM

## 2019-11-02 MED ORDER — ATORVASTATIN CALCIUM 10 MG PO TABS: 10.0000 mg | ORAL_TABLET | Freq: Every day | ORAL | 3 refills | Status: DC

## 2019-11-02 MED ORDER — LANTUS SOLOSTAR 100 UNIT/ML ~~LOC~~ SOPN: 50.0000 [IU] | PEN_INJECTOR | Freq: Every day | SUBCUTANEOUS | 5 refills | Status: DC

## 2019-11-02 NOTE — Telephone Encounter (Signed)
This is a new Rx by you, please, confirm the units.

## 2019-11-05 ENCOUNTER — Other Ambulatory Visit: Payer: Self-pay | Admitting: Family Medicine

## 2019-11-05 MED ORDER — BD PEN NEEDLE NANO U/F 32G X 4 MM MISC: 1.0000 [IU] | 3 refills | Status: DC | PRN

## 2019-11-05 MED ORDER — MAGNESIUM OXIDE 400 (241.3 MG) MG PO TABS: 400.0000 mg | ORAL_TABLET | Freq: Every day | ORAL | 3 refills | Status: DC

## 2019-11-08 ENCOUNTER — Other Ambulatory Visit: Payer: Self-pay | Admitting: Family Medicine

## 2019-11-08 MED ORDER — OMEPRAZOLE 20 MG PO CPDR: 20.0000 mg | DELAYED_RELEASE_CAPSULE | Freq: Two times a day (BID) | ORAL | 0 refills | Status: DC

## 2019-11-12 ENCOUNTER — Other Ambulatory Visit: Payer: Self-pay | Admitting: Family Medicine

## 2019-11-19 ENCOUNTER — Other Ambulatory Visit: Payer: Self-pay | Admitting: Family Medicine

## 2019-11-19 MED ORDER — OMEPRAZOLE 20 MG PO CPDR: 20.0000 mg | DELAYED_RELEASE_CAPSULE | Freq: Two times a day (BID) | ORAL | 0 refills | Status: DC

## 2019-11-22 ENCOUNTER — Telehealth: Payer: Self-pay | Admitting: *Deleted

## 2019-11-22 NOTE — Telephone Encounter (Signed)
Received fax from [facility] requesting PA for omeprazole 40 mg.  PA completed via phone.  Sent for review.  We will be notified of decision by fax.

## 2019-11-23 ENCOUNTER — Ambulatory Visit
Admission: RE | Admit: 2019-11-23 | Discharge: 2019-11-23 | Disposition: A | Discharge status: Home / Self Care | Payer: BC Managed Care – PPO | Source: Ambulatory Visit | Attending: Family Medicine | Admitting: Family Medicine

## 2019-11-23 ENCOUNTER — Encounter: Payer: Self-pay | Admitting: Family Medicine

## 2019-11-23 ENCOUNTER — Ambulatory Visit (INDEPENDENT_AMBULATORY_CARE_PROVIDER_SITE_OTHER): Payer: BC Managed Care – PPO | Admitting: Family Medicine

## 2019-11-23 ENCOUNTER — Other Ambulatory Visit: Payer: Self-pay

## 2019-11-23 VITALS — BP 124/60 | HR 76 | Temp 97.9°F | Ht 71.0 in | Wt 239.8 lb

## 2019-11-23 DIAGNOSIS — S91301A Unspecified open wound, right foot, initial encounter: Secondary | ICD-10-CM | POA: Diagnosis not present

## 2019-11-23 DIAGNOSIS — M7989 Other specified soft tissue disorders: Secondary | ICD-10-CM | POA: Diagnosis not present

## 2019-11-23 DIAGNOSIS — Z23 Encounter for immunization: Secondary | ICD-10-CM | POA: Diagnosis not present

## 2019-11-23 NOTE — Progress Notes (Signed)
Subjective:    Patient ID: Roberto Allen, male    DOB: 03/20/1960, 59 y.o.   MRN: [id number]  HPI Chief Complaint  Patient presents with  . Back Pain     x 2 day llower back   . Foot Swelling    right, sore on foot    Sore on foot for a couple of weeks, his wife has been wrapping it daily.  She applies a cream, he is unsure what it is. Noticed pus x1 and intermittent bloody drainage. No pain. History of diabetes, no history of neuropathy.  Previously saw podiatrist for foot care but has not been in a while.  Right leg pain on side of lower leg, thought it was cramps, comes and goes. Achy, lasts for 30 minutes or more.  Has noticed swelling.  Pain in right hip x 2 days. Two days ago had generalized muscle aches, resolved after hot shower and rest.   Blood sugars 90s fasting, up to 210 postprandial.  Last hemoglobin A1c 08/03/2019 was 7.5.  Review of Systems No headache, no chest pain, no increased SOB.     Objective:   Physical Exam Vitals reviewed.  Constitutional:      General: He is not in acute distress.    Appearance: Normal appearance. He is obese. He is not ill-appearing, toxic-appearing or diaphoretic.  HENT:     Head: Normocephalic and atraumatic.  Cardiovascular:     Rate and Rhythm: Normal rate.  Pulmonary:     Effort: Pulmonary effort is normal.  Musculoskeletal:     Right lower leg: Edema present.     Left lower leg: No edema.     Comments: Normal gait.  Upper extremity/lower extremity strength 5/5.  Some mild diffuse low back tenderness.  Normal range of motion.  Skin:    General: Skin is warm and dry.     Comments: Right sole of foot under first MTC joint with callus and open wound.  No current pustular drainage or erythema.  Some ecchymosis and mild fluctuance under darkened area, suspect some blood retention.  See clinical image.  Neurological:     Mental Status: He is alert and oriented to person, place, and time.  Psychiatric:        Mood and Affect:  Mood normal.        Behavior: Behavior normal.        Thought Content: Thought content normal.        Judgment: Judgment normal.       BP 124/60   Pulse 76   Temp 97.9 F (36.6 C) (Temporal)   Ht 5\' 11"  (1.803 m)   Wt 239 lb 12 oz (108.7 kg)   SpO2 96%   BMI 33.44 kg/m  Wt Readings from Last 3 Encounters:  11/23/19 239 lb 12 oz (108.7 kg)  08/03/19 239 lb (108.4 kg)  07/06/19 239 lb (108.4 kg)         Assessment & Plan:  1. Open wound of right foot, initial encounter -Do not think it is infected.  He will see podiatry on Monday.  I have advised him to keep clean and dry.  Follow-up precautions reviewed. - Ambulatory referral to Podiatry  2. Right leg swelling -Unsure etiology, need to rule out DVT, patient will go for urgent Doppler ultrasound today - US Venous Img Lower Unilateral Right; Future  3. Need for influenza vaccination - Flu Vaccine QUAD 6+ mos PF IM (Fluarix Quad PF)  4. Need for Tdap  vaccine - Tdap vaccine greater than or equal to 7yo IM  -He has follow-up on file for his diabetes later this month  This visit occurred during the SARS-CoV-2 public health emergency.  Safety protocols were in place, including screening questions prior to the visit, additional usage of staff PPE, and extensive cleaning of exam room while observing appropriate contact time as indicated for disinfecting solutions.    Clarene Reamer, FNP-BC  [facility] at [facility], [facility]  11/24/2019 9:11 AM

## 2019-11-23 NOTE — Patient Instructions (Signed)
Good to see you today  Keep right foot clean and dry, use soap and water, pat dry, keep covered   I will notify you of ultrasound results, keep leg elevated

## 2019-11-23 NOTE — Telephone Encounter (Signed)
Kim from [facility] stated test was negative for DVT in right leg. Ultrasound did show baker cyst in right leg behind the knee.  Message given to Clarene Reamer, FNP

## 2019-11-23 NOTE — Telephone Encounter (Signed)
Called and spoke to patient. He has a draining wound on his foot and some back pain with sciatica. He was given an appointment today at 12:15.

## 2019-11-26 ENCOUNTER — Encounter: Payer: Self-pay | Admitting: Podiatry

## 2019-11-26 ENCOUNTER — Ambulatory Visit (INDEPENDENT_AMBULATORY_CARE_PROVIDER_SITE_OTHER): Payer: BC Managed Care – PPO | Admitting: Podiatry

## 2019-11-26 ENCOUNTER — Other Ambulatory Visit: Payer: Self-pay

## 2019-11-26 DIAGNOSIS — M2141 Flat foot [pes planus] (acquired), right foot: Secondary | ICD-10-CM

## 2019-11-26 DIAGNOSIS — L97512 Non-pressure chronic ulcer of other part of right foot with fat layer exposed: Secondary | ICD-10-CM

## 2019-11-26 DIAGNOSIS — M205X1 Other deformities of toe(s) (acquired), right foot: Secondary | ICD-10-CM | POA: Diagnosis not present

## 2019-11-26 DIAGNOSIS — E1142 Type 2 diabetes mellitus with diabetic polyneuropathy: Secondary | ICD-10-CM | POA: Diagnosis not present

## 2019-11-26 DIAGNOSIS — M2142 Flat foot [pes planus] (acquired), left foot: Secondary | ICD-10-CM | POA: Diagnosis not present

## 2019-11-26 DIAGNOSIS — E08621 Diabetes mellitus due to underlying condition with foot ulcer: Secondary | ICD-10-CM

## 2019-11-26 MED ORDER — MUPIROCIN 2 % EX OINT: 1.0000 "application " | TOPICAL_OINTMENT | Freq: Every day | CUTANEOUS | 2 refills | Status: DC

## 2019-11-26 NOTE — Progress Notes (Signed)
  Subjective:  Patient ID: Roberto Allen, male    DOB: 1960/03/21,  MRN: [id number]  Chief Complaint  Patient presents with  . Wound Check    Pt stated that last week he had some pus and blood coming out of a wound he went to the DR on friday who said she believed it was a callous but wanted it to be checked by a Foot DR     59 y.o. male presents with the above complaint. History confirmed with patient.   Objective:  Physical Exam: warm, good capillary refill and normal DP and PT pulses.  Absent protective sensation bilaterally  Right Foot: Limited range of motion of hallux with callus and ulceration with blood blister formation under the MTPJ plantarly.  This extends to subcutaneous tissue after debridement of the overlying hyperkeratosis and blister.  Small 0.6 x 0.5 x 0.2 ulceration that extends to subcutaneous tissue.  No signs of infection or cellulitis.  No purulence.  Assessment:   1. Diabetic ulcer of other part of right foot associated with diabetes mellitus due to underlying condition, with fat layer exposed ([place])   2. Type 2 diabetes mellitus with diabetic polyneuropathy, without long-term current use of insulin (HCC)   3. Hallux limitus of right foot   4. Pes planus of both feet      Plan:  Patient was evaluated and treated and all questions answered.   -Dressing applied consisting of Iodosorb -Offload ulcer with surgical shoe -Surgical shoe dispensed -Wound cleansed and debrided Procedure: Selective Debridement of Wound Rationale: Removal of devitalized tissue from the wound to promote healing.  Pre-Debridement Wound Measurements: 0.6 cm x 0.5 cm x 0.2 cm  Post-Debridement Wound Measurements: same as pre-debridement. Type of Debridement: sharp selective Tissue Removed: Devitalized soft-tissue Dressing: Dry, sterile, compression dressing. Disposition: Patient tolerated procedure well. Patient to return in 1 week for follow-up.   Return in about 3 weeks (around  12/17/2019) for wound re-check.

## 2019-11-26 NOTE — Patient Instructions (Signed)
Monitor for any signs/symptoms of infection. Signs of an infection could be redness beyond the site of the incision/procedure/wound, foul smelling odor, drainage that is thick and yellow or green, or severe swelling and pain. Call the office immediately if any occur or go directly to the emergency room. Call with any questions/concerns.   Apply the mupirocin (bactroban) ointment daily with a clean gauze dressing.

## 2019-11-27 ENCOUNTER — Ambulatory Visit: Payer: BC Managed Care – PPO | Admitting: Podiatry

## 2019-12-02 ENCOUNTER — Other Ambulatory Visit: Payer: Self-pay | Admitting: Family Medicine

## 2019-12-02 DIAGNOSIS — E1122 Type 2 diabetes mellitus with diabetic chronic kidney disease: Secondary | ICD-10-CM

## 2019-12-02 DIAGNOSIS — N1832 Chronic kidney disease, stage 3b: Secondary | ICD-10-CM

## 2019-12-02 DIAGNOSIS — E669 Obesity, unspecified: Secondary | ICD-10-CM

## 2019-12-02 DIAGNOSIS — I10 Essential (primary) hypertension: Secondary | ICD-10-CM

## 2019-12-03 ENCOUNTER — Other Ambulatory Visit: Payer: Self-pay | Admitting: Family Medicine

## 2019-12-03 DIAGNOSIS — E1122 Type 2 diabetes mellitus with diabetic chronic kidney disease: Secondary | ICD-10-CM

## 2019-12-03 DIAGNOSIS — N1832 Chronic kidney disease, stage 3b: Secondary | ICD-10-CM

## 2019-12-10 ENCOUNTER — Encounter: Payer: Self-pay | Admitting: Family Medicine

## 2019-12-12 ENCOUNTER — Other Ambulatory Visit: Payer: Self-pay | Admitting: Family Medicine

## 2019-12-12 MED ORDER — JARDIANCE 25 MG PO TABS: 25.0000 mg | ORAL_TABLET | Freq: Every day | ORAL | 1 refills | Status: DC

## 2019-12-12 NOTE — Telephone Encounter (Signed)
Last OV 11/23/19  Next OV 12/28/19 Last fill: medication not filled by you

## 2019-12-13 ENCOUNTER — Other Ambulatory Visit: Payer: Self-pay

## 2019-12-13 ENCOUNTER — Ambulatory Visit (INDEPENDENT_AMBULATORY_CARE_PROVIDER_SITE_OTHER): Payer: BC Managed Care – PPO | Admitting: Podiatry

## 2019-12-13 ENCOUNTER — Other Ambulatory Visit: Payer: BC Managed Care – PPO

## 2019-12-13 DIAGNOSIS — E08621 Diabetes mellitus due to underlying condition with foot ulcer: Secondary | ICD-10-CM

## 2019-12-13 DIAGNOSIS — L97512 Non-pressure chronic ulcer of other part of right foot with fat layer exposed: Secondary | ICD-10-CM

## 2019-12-13 DIAGNOSIS — E1142 Type 2 diabetes mellitus with diabetic polyneuropathy: Secondary | ICD-10-CM

## 2019-12-13 DIAGNOSIS — R609 Edema, unspecified: Secondary | ICD-10-CM

## 2019-12-13 MED ORDER — DOXYCYCLINE HYCLATE 100 MG PO TABS: 100.0000 mg | ORAL_TABLET | Freq: Two times a day (BID) | ORAL | 0 refills | Status: DC

## 2019-12-13 MED ORDER — SILVER SULFADIAZINE 1 % EX CREA: 1.0000 "application " | TOPICAL_CREAM | Freq: Every day | CUTANEOUS | 0 refills | Status: DC

## 2019-12-13 NOTE — Telephone Encounter (Signed)
Case number: [id number] Faxed received omeprazole Cap 20mg   Approved through 11/21/2020.

## 2019-12-14 NOTE — Progress Notes (Signed)
Subjective: 59 year old male presents the office today for evaluation of a wound on the right foot submetatarsal 1.  He states the area started off as a blister and he thinks the area is doing better.  He has not been on antibiotics please also note swelling to the right leg.  He states this morning on the present time he previously had a venous duplex rule out DVT which was negative.  He currently denies any fevers or chills.  No nausea or vomiting.  No other concerns today. Denies any systemic complaints such as fevers, chills, nausea, vomiting. No acute changes since last appointment, and no other complaints at this time.   Objective: AAO x3, NAD DP/PT pulses palpable bilaterally, CRT less than 3 seconds On the right foot submetatarsal is a hyperkeratotic lesion upon debridement there is a central ulceration measuring 0.3 x 0.2 cm with a depth of 0.2.  There is no probing, undermining or tunneling.  There is no surrounding erythema, ascending cellulitis.  There is no fluctuation capitation.  There is no malodor.  Decreased tenderness in the first MPJ No pain with calf compression, swelling, warmth, erythema  Assessment: Ulceration right foot  Plan: -All treatment options discussed with the patient including all alternatives, risks, complications.  -Sharp debrided the wound today lasting for 312 scalpel to any complications or bleeding.  I want him to continue with Silvadene dressing changes was applied today as well.  I prescribed doxycycline as well given the swelling to the leg although this is been chronic but will do antibiotic in case of infection.  Encouraged elevation.  Continue offloading as well.  Encourage glucose control. -Patient encouraged to call the office with any questions, concerns, change in symptoms.

## 2019-12-19 ENCOUNTER — Encounter: Payer: BC Managed Care – PPO | Admitting: Family Medicine

## 2019-12-28 ENCOUNTER — Encounter: Payer: BC Managed Care – PPO | Admitting: Family Medicine

## 2019-12-31 ENCOUNTER — Ambulatory Visit: Payer: BC Managed Care – PPO | Admitting: Podiatry

## 2020-02-01 ENCOUNTER — Other Ambulatory Visit: Payer: Self-pay | Admitting: Family Medicine

## 2020-02-01 ENCOUNTER — Encounter: Payer: Self-pay | Admitting: Family Medicine

## 2020-02-01 DIAGNOSIS — E1142 Type 2 diabetes mellitus with diabetic polyneuropathy: Secondary | ICD-10-CM

## 2020-02-01 MED ORDER — ONETOUCH VERIO VI STRP: 1.0000 | ORAL_STRIP | 11 refills | Status: DC | PRN

## 2020-02-01 MED ORDER — ONETOUCH VERIO REFLECT W/DEVICE KIT: 1.0000 [IU] | PACK | 0 refills | Status: DC | PRN

## 2020-02-02 ENCOUNTER — Emergency Department (HOSPITAL_COMMUNITY)
Admission: EM | Admit: 2020-02-02 | Discharge: 2020-02-03 | Disposition: A | Discharge status: Home / Self Care | Payer: BC Managed Care – PPO | Attending: Emergency Medicine | Admitting: Emergency Medicine

## 2020-02-02 ENCOUNTER — Other Ambulatory Visit: Payer: Self-pay

## 2020-02-02 DIAGNOSIS — Z7982 Long term (current) use of aspirin: Secondary | ICD-10-CM | POA: Insufficient documentation

## 2020-02-02 DIAGNOSIS — L97519 Non-pressure chronic ulcer of other part of right foot with unspecified severity: Secondary | ICD-10-CM | POA: Insufficient documentation

## 2020-02-02 DIAGNOSIS — E11628 Type 2 diabetes mellitus with other skin complications: Secondary | ICD-10-CM

## 2020-02-02 DIAGNOSIS — I130 Hypertensive heart and chronic kidney disease with heart failure and stage 1 through stage 4 chronic kidney disease, or unspecified chronic kidney disease: Secondary | ICD-10-CM | POA: Insufficient documentation

## 2020-02-02 DIAGNOSIS — E1165 Type 2 diabetes mellitus with hyperglycemia: Secondary | ICD-10-CM | POA: Diagnosis not present

## 2020-02-02 DIAGNOSIS — I2510 Atherosclerotic heart disease of native coronary artery without angina pectoris: Secondary | ICD-10-CM | POA: Insufficient documentation

## 2020-02-02 DIAGNOSIS — Z794 Long term (current) use of insulin: Secondary | ICD-10-CM | POA: Insufficient documentation

## 2020-02-02 DIAGNOSIS — I5022 Chronic systolic (congestive) heart failure: Secondary | ICD-10-CM | POA: Diagnosis not present

## 2020-02-02 DIAGNOSIS — E11621 Type 2 diabetes mellitus with foot ulcer: Secondary | ICD-10-CM | POA: Diagnosis not present

## 2020-02-02 DIAGNOSIS — N183 Chronic kidney disease, stage 3 unspecified: Secondary | ICD-10-CM | POA: Diagnosis not present

## 2020-02-02 DIAGNOSIS — E1122 Type 2 diabetes mellitus with diabetic chronic kidney disease: Secondary | ICD-10-CM | POA: Diagnosis not present

## 2020-02-02 DIAGNOSIS — Z7984 Long term (current) use of oral hypoglycemic drugs: Secondary | ICD-10-CM | POA: Insufficient documentation

## 2020-02-02 DIAGNOSIS — E876 Hypokalemia: Secondary | ICD-10-CM

## 2020-02-02 DIAGNOSIS — Z87891 Personal history of nicotine dependence: Secondary | ICD-10-CM | POA: Diagnosis not present

## 2020-02-02 DIAGNOSIS — R252 Cramp and spasm: Secondary | ICD-10-CM | POA: Diagnosis present

## 2020-02-02 DIAGNOSIS — Z79899 Other long term (current) drug therapy: Secondary | ICD-10-CM | POA: Insufficient documentation

## 2020-02-02 DIAGNOSIS — J449 Chronic obstructive pulmonary disease, unspecified: Secondary | ICD-10-CM | POA: Insufficient documentation

## 2020-02-02 DIAGNOSIS — R739 Hyperglycemia, unspecified: Secondary | ICD-10-CM

## 2020-02-02 LAB — URINALYSIS, ROUTINE W REFLEX MICROSCOPIC
Bacteria, UA: NONE SEEN
Bilirubin Urine: NEGATIVE
Glucose, UA: >=500 mg/dL — AB
Ketones, ur: NEGATIVE mg/dL
Leukocytes,Ua: NEGATIVE
Nitrite: NEGATIVE
Protein, ur: 100 mg/dL — AB
Specific Gravity, Urine: 1.026 (ref 1.005–1.030)
pH: 5 (ref 5.0–8.0)

## 2020-02-02 LAB — CBC
HCT: 36.5 % — ABNORMAL LOW (ref 39.0–52.0)
Hemoglobin: 12.3 g/dL — ABNORMAL LOW (ref 13.0–17.0)
MCH: 29.5 pg (ref 26.0–34.0)
MCHC: 33.7 g/dL (ref 30.0–36.0)
MCV: 87.5 fL (ref 80.0–100.0)
Platelets: 282 10*3/uL (ref 150–400)
RBC: 4.17 MIL/uL — ABNORMAL LOW (ref 4.22–5.81)
RDW: 13.4 % (ref 11.5–15.5)
WBC: 9.4 10*3/uL (ref 4.0–10.5)
nRBC: 0 % (ref 0.0–0.2)

## 2020-02-02 LAB — CBG MONITORING, ED: Glucose-Capillary: 454 mg/dL — ABNORMAL HIGH (ref 70–99)

## 2020-02-02 NOTE — ED Triage Notes (Addendum)
Pt presents to ED BIB [facility]. Pt c/o body cramps. Pt normally takes mag but has been out x1w. EMS given 250 ml IVF  EMS VS -  CBG - 460 132/74 HR - 70 99% RA

## 2020-02-03 ENCOUNTER — Emergency Department (HOSPITAL_COMMUNITY): Payer: BC Managed Care – PPO

## 2020-02-03 LAB — MAGNESIUM: Magnesium: 2 mg/dL (ref 1.7–2.4)

## 2020-02-03 LAB — BASIC METABOLIC PANEL
Anion gap: 15 (ref 5–15)
BUN: 26 mg/dL — ABNORMAL HIGH (ref 8–23)
CO2: 21 mmol/L — ABNORMAL LOW (ref 22–32)
Calcium: 9.1 mg/dL (ref 8.9–10.3)
Chloride: 98 mmol/L (ref 98–111)
Creatinine, Ser: 2.2 mg/dL — ABNORMAL HIGH (ref 0.61–1.24)
GFR, Estimated: 33 mL/min — ABNORMAL LOW (ref >60)
Glucose, Bld: 458 mg/dL — ABNORMAL HIGH (ref 70–99)
Potassium: 3.2 mmol/L — ABNORMAL LOW (ref 3.5–5.1)
Sodium: 134 mmol/L — ABNORMAL LOW (ref 135–145)

## 2020-02-03 LAB — CBG MONITORING, ED: Glucose-Capillary: 298 mg/dL — ABNORMAL HIGH (ref 70–99)

## 2020-02-03 MED ORDER — POTASSIUM CHLORIDE CRYS ER 20 MEQ PO TBCR: 20.0000 meq | EXTENDED_RELEASE_TABLET | Freq: Two times a day (BID) | ORAL | 0 refills | Status: DC

## 2020-02-03 MED ORDER — AMOXICILLIN-POT CLAVULANATE 875-125 MG PO TABS: 1.0000 | ORAL_TABLET | Freq: Two times a day (BID) | ORAL | 0 refills | Status: DC

## 2020-02-03 NOTE — ED Provider Notes (Signed)
[facility] EMERGENCY DEPARTMENT Provider Note   CSN: [id number] Arrival date & time: 02/02/20  2246     History Chief Complaint  Patient presents with   Body Cramps    Roberto Allen is a 59 y.o. male.  HPI Patient presents with muscle cramps.  States began yesterday afternoon.  States it will be an in his legs and arms.  Will go from 1 side to the other.  States he has had episodes like this before.  States he has had issues with his magnesium dropping for.  States his muscle cramps have improved since has been in the ER.  No fevers.  No cough.  Sugar found to be high.  He is diabetic.  States he did have a doughnut at breakfast yesterday but did not think it should make sugar go that time.  States he has been taking his normal insulin.  No fevers.  No cough.  No headache.  No confusion.  No lateralizing numbness or weakness.    Past Medical History:  Diagnosis Date   CAD (coronary artery disease)    a. LHC 4/13: mD1 25%, oD2 25%, pOM1 25%, pRCA 25%, dRCA 40%, EF 83%   Chronic systolic heart failure (HCC)    a. EF 30-35% in 04/2011, improved to 55-60% in 01/2012.   CKD (chronic kidney disease), stage II    COPD (chronic obstructive pulmonary disease) (HCC)    Diabetes mellitus    GERD (gastroesophageal reflux disease)    History of pneumonia    Hx of cardiovascular stress test    a. Nuclear (12/2012):  No ischemia, EF 59%   Hyperkalemia    a. 12/2012 - spironolactone, KCl supp discontinued.   Hyperlipidemia    Hypertension    Hypertriglyceridemia    a. 12/2012 - started on fenofibrate.   Knee pain    NICM (nonischemic cardiomyopathy) ([place])    a. Echo 3/13: mild LVH, EF 30-35%, grade 3 diast dysfxn, mod LAE;  RHC 4/13:  RA 8, RV 58/10, PA 56/18, mean 35, PCWP mean 17, LV 154/27, CO 4.3, CI 2.0. b. EF Improved >>> Echo (01/2012):  Mild LVH, EF 55-60%, no RWMA, Gr 1 DD    Patient Active Problem List   Diagnosis Date Noted   CHF  (congestive heart failure) ([place]) 04/28/2019   Hypertension associated with stage 4 chronic kidney disease due to type 2 diabetes mellitus ([place]) 08/30/2017   Hypertension secondary to other renal disorders 11/29/2016   GERD (gastroesophageal reflux disease) 07/23/2016   Degenerative disc disease at L5-S1 level 04/09/2016   Primary osteoarthritis of right hip 04/09/2016   Obesity, Class I, BMI 30-34.9 02/06/2016   Ulcer of left foot ([place]) 01/06/2015   Microalbuminuria due to type 2 diabetes mellitus ([place]) 10/03/2014   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 diabetes mellitus ([place]) 06/26/2014   NICM (nonischemic cardiomyopathy) ([place]) 09/06/2013   ED (erectile dysfunction) 03/27/2013   Acute on chronic renal insufficiency 12/17/2012   Hypertriglyceridemia 12/17/2012   Disorder of kidney and ureter 12/17/2012   Precordial chest pain 12/16/2012   Hyperkalemia 12/16/2012   Diabetes mellitus ([place]) 12/16/2012   Coronary Artery Disease (non-obstructive by cath 05/2011) 06/14/2011   Hyperlipidemia 06/14/2011   OSA (obstructive sleep apnea) 81/84/0375   Chronic systolic heart failure ([place]) 05/11/2011   Heart failure ([place]) 04/27/2011   Hypertension    SOB (shortness of breath)     Past Surgical History:  Procedure Laterality Date   CARDIAC CATHETERIZATION  2013   non-obs dz 25-40% multiple vessels   TENOTOMY  2018       Family History  Problem Relation Age of Onset   Diabetes Sister    Breast cancer Mother    Breast cancer Maternal Grandmother     Social History   Tobacco Use   Smoking status: Former Smoker    Packs/day: 1.00    Years: 20.00    Pack years: 20.00    Types: Cigarettes    Quit date: 09/15/2006    Years since quitting: 13.3   Smokeless tobacco: Never Used  Vaping Use   Vaping Use: Never used  Substance Use Topics   Alcohol use: Yes    Comment: occ   Drug use: No    Home Medications Prior to Admission medications   Medication Sig Start  Date End Date Taking? Authorizing Provider  ACCU-CHEK FASTCLIX LANCETS MISC USE TO MONITOR BLOOD GLUCOSE 3 TIME(S) DAILY 05/05/17   [provider]  aMILoride (MIDAMOR) 5 MG tablet Take 5 mg by mouth daily. 06/06/19   [provider]  amLODipine (NORVASC) 5 MG tablet Take 1 tablet (5 mg total) by mouth daily. 05/01/19   Geradine Girt, DO  aspirin EC 81 MG EC tablet Take 1 tablet (81 mg total) by mouth daily. 05/02/19   Geradine Girt, DO  atorvastatin (LIPITOR) 10 MG tablet Take 1 tablet (10 mg total) by mouth daily. 11/02/19   Elby Beck, FNP  BD PEN NEEDLE NANO U/F 32G X 4 MM MISC Inject 1 Units into the skin as needed. 11/05/19   Elby Beck, FNP  Blood Glucose Monitoring Suppl (ONETOUCH VERIO REFLECT) w/Device KIT 1 Units by Does not apply route as needed. 02/01/20   Elby Beck, FNP  cholecalciferol (VITAMIN D) 25 MCG (1000 UNIT) tablet Take 1,000 Units by mouth daily. 05/25/19   [provider]  Cholecalciferol (VITAMIN D3) 50 MCG (2000 UT) TABS Take 1 tablet by mouth once a week. 07/25/19   [provider]  doxycycline (VIBRA-TABS) 100 MG tablet Take 1 tablet (100 mg total) by mouth 2 (two) times daily. 12/13/19   [name], DPM  ergocalciferol (VITAMIN D2) 1.25 MG (50000 UT) capsule Take by mouth.    [provider]  glucose blood (ONETOUCH VERIO) test strip 1 each by Other route as needed for other (3-4 times per day). Use as instructed 02/01/20   Elby Beck, FNP  HUMALOG KWIKPEN 100 UNIT/ML KwikPen SMARTSIG:15 Unit(s) SUB-Q 3 Times Daily PRN 06/19/19   [provider]  hydrALAZINE (APRESOLINE) 25 MG tablet Take 25 mg by mouth 3 (three) times daily.  06/22/16   [provider]  isosorbide mononitrate (IMDUR) 120 MG 24 hr tablet Take 1 tablet (120 mg total) by mouth daily. 05/02/19   Geradine Girt, DO  JARDIANCE 25 MG TABS tablet Take 1 tablet (25 mg total) by mouth daily. 12/12/19   Elby Beck, FNP  LANTUS SOLOSTAR 100 UNIT/ML Solostar Pen Inject 50 Units into the skin at bedtime. 11/02/19   Elby Beck, FNP  losartan (COZAAR) 25 MG tablet Take 25 mg by mouth daily.    [provider]  magnesium oxide (MAG-OX) 400 (241.3 Mg) MG tablet Take 1 tablet (400 mg total) by mouth daily. 11/05/19   Elby Beck, FNP  magnesium oxide (MAG-OX) 400 MG tablet Take by mouth. Patient not taking: Reported on 11/23/2019 05/02/19   [provider]  metoprolol  succinate (TOPROL-XL) 100 MG 24 hr tablet Take 100 mg by mouth daily.  06/22/16   [provider]  mupirocin ointment (BACTROBAN) 2 % Apply 1 application topically daily. 11/26/19   McDonald, Stephan Minister, DPM  omeprazole (PRILOSEC) 20 MG capsule Take 1 capsule (20 mg total) by mouth 2 (two) times daily before a meal. 11/19/19   Elby Beck, FNP  OZEMPIC, 1 MG/DOSE, 4 MG/3ML SOPN INJECT 1 MG UNDER SKIN ONCE A WEEK 08/23/19   [provider]  potassium chloride SA (KLOR-CON) 20 MEQ tablet Take 1 tablet (20 mEq total) by mouth daily. 05/01/19   Geradine Girt, DO  silver sulfADIAZINE (SILVADENE) 1 % cream Apply 1 application topically daily. 12/13/19   [name], DPM  tadalafil (CIALIS) 20 MG tablet Take by mouth. 02/28/19   [provider]  potassium chloride (KLOR-CON) 20 MEQ packet Take 20 mEq by mouth 2 (two) times daily.  04/23/11  [provider]    Allergies    Metformin and related  Review of Systems   Review of Systems  Constitutional: Negative for appetite change.  HENT: Negative for congestion.   Respiratory: Negative for shortness of breath.   Cardiovascular: Negative for chest pain.  Gastrointestinal: Negative for abdominal pain.  Endocrine: Positive for polyuria.  Genitourinary: Negative for flank pain.  Musculoskeletal: Positive for myalgias.       Muscle cramps.  Skin: Negative for rash.  Neurological: Negative for weakness.  Psychiatric/Behavioral:  Negative for confusion.    Physical Exam Updated Vital Signs BP (!) 140/92 (BP Location: Right Arm)    Pulse 67    Temp 97.9 F (36.6 C) (Oral)    Resp 18    SpO2 98%   Physical Exam Vitals and nursing note reviewed.  HENT:     Head: Normocephalic.     Mouth/Throat:     Mouth: Mucous membranes are moist.  Eyes:     General: No scleral icterus. Cardiovascular:     Rate and Rhythm: Regular rhythm.  Pulmonary:     Breath sounds: No wheezing or rhonchi.  Abdominal:     Tenderness: There is no abdominal tenderness.  Musculoskeletal:        General: No tenderness.     Cervical back: Neck supple.  Skin:    General: Skin is warm.     Capillary Refill: Capillary refill takes less than 2 seconds.  Neurological:     Mental Status: He is alert and oriented to person, place, and time.  Psychiatric:        Mood and Affect: Mood normal.   Right foot showed possible abscess of ball of foot with vesicle more medial.  Needle aspiration done and removed 1 to 2 cc of fluid that was purulent.    ED Results / Procedures / Treatments   Labs (all labs ordered are listed, but only abnormal results are displayed) Labs Reviewed  BASIC METABOLIC PANEL - Abnormal; Notable for the following components:      Result Value   Sodium 134 (*)    Potassium 3.2 (*)    CO2 21 (*)    Glucose, Bld 458 (*)    BUN 26 (*)    Creatinine, Ser 2.20 (*)    GFR, Estimated 33 (*)    All other components within normal limits  CBC - Abnormal; Notable for the following components:   RBC 4.17 (*)    Hemoglobin 12.3 (*)    HCT 36.5 (*)  All other components within normal limits  URINALYSIS, ROUTINE W REFLEX MICROSCOPIC - Abnormal; Notable for the following components:   Color, Urine STRAW (*)    Glucose, UA >=500 (*)    Hgb urine dipstick SMALL (*)    Protein, ur 100 (*)    All other components within normal limits  CBG MONITORING, ED - Abnormal; Notable for the following components:   Glucose-Capillary  454 (*)    All other components within normal limits  MAGNESIUM  CBG MONITORING, ED    EKG None  Radiology No results found.  Procedures Procedures (including critical care time)  Medications Ordered in ED Medications - No data to display  ED Course  I have reviewed the triage vital signs and the nursing notes.  Pertinent labs & imaging results that were available during my care of the patient were reviewed by me and considered in my medical decision making (see chart for details).    MDM Rules/Calculators/A&P                          Patient presented initially complaining of muscle cramps.  No fevers.  States this happened when his magnesium is gone low in the past.  However magnesium reassuring now.  Potassium low and was supplemented.  We will give short course of supplementation since patient is on potassium sparing diuretic has had problems with hyperkalemia in the past.  Initial hyperglycemia improved with treatment.  Patient was being discharged home and then mentioned that he had had a wound on his right foot.  I examined and did show possible abscess to right foot.  There was a purulent area that was drained and culture sent.  White count reassuring.  Not febrile.  I think patient stable for discharge patient has seen podiatry in the past and will have follow-up with him.  Started with Augmentin for treatment.  Culture.  Will need to be followed to see adjustment of medications if needed. Final Clinical Impression(s) / ED Diagnoses Final diagnoses:  None    Rx / DC Orders ED Discharge Orders    None       Davonna Belling, MD 02/03/20 1546

## 2020-02-03 NOTE — Discharge Instructions (Addendum)
Follow-up with your podiatrist. A culture has been sent and you should be notified if the antibiotics do not cover it. Return for fevers chills or worsening of symptoms.

## 2020-02-03 NOTE — ED Notes (Signed)
C/o cramping all over states he usually gets cramps twice a week, however last pm wasn't able to get rid of them

## 2020-02-06 LAB — AEROBIC CULTURE W GRAM STAIN (SUPERFICIAL SPECIMEN)

## 2020-02-07 ENCOUNTER — Telehealth: Payer: Self-pay | Admitting: *Deleted

## 2020-02-07 ENCOUNTER — Ambulatory Visit (INDEPENDENT_AMBULATORY_CARE_PROVIDER_SITE_OTHER): Payer: BC Managed Care – PPO | Admitting: Podiatry

## 2020-02-07 ENCOUNTER — Other Ambulatory Visit: Payer: Self-pay

## 2020-02-07 DIAGNOSIS — E08621 Diabetes mellitus due to underlying condition with foot ulcer: Secondary | ICD-10-CM

## 2020-02-07 DIAGNOSIS — L97512 Non-pressure chronic ulcer of other part of right foot with fat layer exposed: Secondary | ICD-10-CM | POA: Diagnosis not present

## 2020-02-07 DIAGNOSIS — L02611 Cutaneous abscess of right foot: Secondary | ICD-10-CM | POA: Diagnosis not present

## 2020-02-07 MED ORDER — DOXYCYCLINE HYCLATE 100 MG PO TABS: 100.0000 mg | ORAL_TABLET | Freq: Two times a day (BID) | ORAL | 0 refills | Status: DC

## 2020-02-07 NOTE — Telephone Encounter (Signed)
Post ED Visit - Positive Culture Follow-up  Culture report reviewed by antimicrobial stewardship pharmacist: [place] Team []  Elenor Quinones, Pharm.D. []  Heide Guile, Pharm.D., BCPS AQ-ID []  Parks Neptune, Pharm.D., BCPS []  Alycia Rossetti, Pharm.D., BCPS []  [place], Pharm.D., BCPS, AAHIVP []  Legrand Como, Pharm.D., BCPS, AAHIVP []  Salome Arnt, PharmD, BCPS []  Johnnette Gourd, PharmD, BCPS []  Hughes Better, PharmD, BCPS []  Leeroy Cha, PharmD []  Laqueta Linden, PharmD, BCPS []  Albertina Parr, PharmD  [place] Team []  Leodis Sias, PharmD []  Lindell Spar, PharmD []  Royetta Asal, PharmD []  Graylin Shiver, Rph []  Rema Fendt) Glennon Mac, PharmD []  Arlyn Dunning, PharmD []  Netta Cedars, PharmD []  Dia Sitter, PharmD []  Leone Haven, PharmD []  Gretta Arab, PharmD []  Theodis Shove, PharmD []  Peggyann Juba, PharmD []  Reuel Boom, PharmD   Positive urine culture Treated with Amoxicillin-Pot Clavulanate, organism sensitive to the same and no further patient follow-up is required at this time.  Cristela Felt, PharmD  Harlon Flor Talley 02/07/2020, 11:04 AM

## 2020-02-11 LAB — WOUND CULTURE
MICRO NUMBER:: 11353455
SPECIMEN QUALITY:: ADEQUATE

## 2020-02-12 NOTE — Progress Notes (Signed)
Subjective: 59 year old male presents the office with concerns of wound, infection to his right foot.  He had gone to the emergency department for chest pain and wound was found.  He was started on Augmentin which he finished.  He states the wound is doing better but still gets some drainage. He states the swelling to the foot has improved as well. Denies any systemic complaints such as fevers, chills, nausea, vomiting. No acute changes since last appointment, and no other complaints at this time.   Objective: AAO x3, NAD DP/PT pulses palpable bilaterally, CRT less than 3 seconds Submetatarsal 1 ulceration on the right foot.  There is an adjacent blister.  Upon debridement of the wound purulence was expressed and this is coming from the blister.  I did debride nonviable tissue as well as the area of the blister given the infection underlying skin intact and was superficial.  No further fluctuation identified there is no crepitation.  Mild edema to the foot with minimal warmth.  No pain with calf compression, swelling, warmth, erythema  Assessment: Ulceration, abscess right foot  Plan: -All treatment options discussed with the patient including all alternatives, risks, complications.  -There should be debrided the wound present with result with scalpel as well as a tissue nipper to debride the wound of a healthy tissue.  Also debrided some of the blister formation as well due to infection.  Recommended Betadine to the area.  Prescribe doxycycline and wound culture was obtained. -Patient encouraged to call the office with any questions, concerns, change in symptoms.   [name] DPM

## 2020-02-14 ENCOUNTER — Ambulatory Visit (INDEPENDENT_AMBULATORY_CARE_PROVIDER_SITE_OTHER): Payer: BC Managed Care – PPO | Admitting: Podiatry

## 2020-02-14 ENCOUNTER — Other Ambulatory Visit: Payer: Self-pay

## 2020-02-14 DIAGNOSIS — L97512 Non-pressure chronic ulcer of other part of right foot with fat layer exposed: Secondary | ICD-10-CM

## 2020-02-14 DIAGNOSIS — E1142 Type 2 diabetes mellitus with diabetic polyneuropathy: Secondary | ICD-10-CM | POA: Diagnosis not present

## 2020-02-14 DIAGNOSIS — E08621 Diabetes mellitus due to underlying condition with foot ulcer: Secondary | ICD-10-CM | POA: Diagnosis not present

## 2020-02-14 DIAGNOSIS — L02611 Cutaneous abscess of right foot: Secondary | ICD-10-CM | POA: Diagnosis not present

## 2020-02-19 NOTE — Progress Notes (Signed)
Subjective: 60 year old male presents the office for follow-up evaluation of an ulceration, infection to his right foot.  He presents today with his wife.  Feels that the wound is doing much better and appears to be healed.  Denies any drainage or pus.  No swelling or redness that he reports.  Still on antibiotics. Denies any systemic complaints such as fevers, chills, nausea, vomiting. No acute changes since last appointment, and no other complaints at this time.   Objective: AAO x3, NAD DP/PT pulses palpable bilaterally, CRT less than 3 seconds On the area submetatarsal 1 right foot is a hyperkeratotic lesion.  Upon debridement appears that the wound is healed.  There is no significant edema there is no erythema, drainage or pus or ascending cellulitis.  There is no fluctuation crepitation or any malodor.  No blister formation identified today.  No warmth to the foot today.  No pain. No pain with calf compression, swelling, warmth, erythema  Assessment: Ulceration, abscess right foot-resolved  Plan: -All treatment options discussed with the patient including all alternatives, risks, complications.  -Debrided hyperkeratotic tissue to any complications or bleeding.  This time the wound is healed there is no drainage or any signs of infection today.  I did review the wound cultures.  Although resistant to doxycycline and appears the infection has resolved and would hold off any further antibiotics. -Discussed daily foot inspection and offloading.  [name] DPM

## 2020-03-06 ENCOUNTER — Telehealth: Payer: Self-pay

## 2020-03-06 NOTE — Telephone Encounter (Signed)
Contacted [facility] who reports no PA is needed, they are requesting a refill.    Last apt 11/23/19 Next apt: Not made TOC yet Last refill: 11/19/19, #90, 0 RF

## 2020-03-06 NOTE — Telephone Encounter (Signed)
Pt left a message on triage line that there was a problem at the pharmacy with his omeprazole authorization. I asked Cardell Peach if she knew anything about it. She does not have a PA for this. I will see if Randall An has one as he is a Debbie pt and Jackelyn Poling is no longer with the office.

## 2020-03-11 MED ORDER — OMEPRAZOLE 20 MG PO CPDR: 20.0000 mg | DELAYED_RELEASE_CAPSULE | Freq: Two times a day (BID) | ORAL | 0 refills | Status: DC

## 2020-03-11 NOTE — Telephone Encounter (Signed)
TOC scheduled with Dr. Glori Bickers and med refilled

## 2020-03-13 ENCOUNTER — Other Ambulatory Visit: Payer: Self-pay

## 2020-03-13 ENCOUNTER — Ambulatory Visit (INDEPENDENT_AMBULATORY_CARE_PROVIDER_SITE_OTHER): Payer: 59 | Admitting: Podiatry

## 2020-03-13 DIAGNOSIS — L02611 Cutaneous abscess of right foot: Secondary | ICD-10-CM

## 2020-03-13 DIAGNOSIS — L84 Corns and callosities: Secondary | ICD-10-CM

## 2020-03-13 DIAGNOSIS — E1142 Type 2 diabetes mellitus with diabetic polyneuropathy: Secondary | ICD-10-CM | POA: Diagnosis not present

## 2020-03-16 NOTE — Progress Notes (Signed)
Subjective: 60 year old male presents the office for follow-up evaluation of an ulceration, infection to his right foot.  He presents today by himself.  His wife changes with his daily.  He states that the wound is healed denies any redness or drainage or any swelling.  Denies any new ulcerations or issues.  No blisters.  No other concerns. Denies any systemic complaints such as fevers, chills, nausea, vomiting. No acute changes since last appointment, and no other complaints at this time.   Objective: AAO x3, NAD DP/PT pulses palpable bilaterally, CRT less than 3 seconds On the area submetatarsal 1 right foot is a hyperkeratotic lesion.  There is minimal hyperkeratotic tissue today and after debridement there is no underlying ulceration drainage or signs of infection.  Wound appears to be healed as well as the infection resolved.  Noted ulcerations identified there is no blister formation. No pain with calf compression, swelling, warmth, erythema  Assessment: Ulceration, abscess right foot-resolved  Plan: -All treatment options discussed with the patient including all alternatives, risks, complications.  -Sharply debrided the hyperkeratotic tissue with any complications or bleeding.  Minimal hyperkeratotic tissue there is no ulcerations.  No signs of infection.  Continue moisturizer daily and offloading.  Discussed importance daily foot inspection.  [name] DPM

## 2020-03-20 ENCOUNTER — Ambulatory Visit (INDEPENDENT_AMBULATORY_CARE_PROVIDER_SITE_OTHER): Payer: 59 | Admitting: Family Medicine

## 2020-03-20 ENCOUNTER — Encounter: Payer: Self-pay | Admitting: Family Medicine

## 2020-03-20 ENCOUNTER — Other Ambulatory Visit: Payer: Self-pay

## 2020-03-20 VITALS — BP 140/80 | HR 76 | Temp 97.2°F | Ht 69.5 in | Wt 221.5 lb

## 2020-03-20 DIAGNOSIS — E1122 Type 2 diabetes mellitus with diabetic chronic kidney disease: Secondary | ICD-10-CM | POA: Diagnosis not present

## 2020-03-20 DIAGNOSIS — E669 Obesity, unspecified: Secondary | ICD-10-CM

## 2020-03-20 DIAGNOSIS — E785 Hyperlipidemia, unspecified: Secondary | ICD-10-CM | POA: Diagnosis not present

## 2020-03-20 DIAGNOSIS — I428 Other cardiomyopathies: Secondary | ICD-10-CM

## 2020-03-20 DIAGNOSIS — Z794 Long term (current) use of insulin: Secondary | ICD-10-CM

## 2020-03-20 DIAGNOSIS — R809 Proteinuria, unspecified: Secondary | ICD-10-CM

## 2020-03-20 DIAGNOSIS — N183 Chronic kidney disease, stage 3 unspecified: Secondary | ICD-10-CM

## 2020-03-20 DIAGNOSIS — L97521 Non-pressure chronic ulcer of other part of left foot limited to breakdown of skin: Secondary | ICD-10-CM

## 2020-03-20 DIAGNOSIS — E1169 Type 2 diabetes mellitus with other specified complication: Secondary | ICD-10-CM

## 2020-03-20 DIAGNOSIS — E1142 Type 2 diabetes mellitus with diabetic polyneuropathy: Secondary | ICD-10-CM

## 2020-03-20 DIAGNOSIS — I10 Essential (primary) hypertension: Secondary | ICD-10-CM

## 2020-03-20 DIAGNOSIS — E1129 Type 2 diabetes mellitus with other diabetic kidney complication: Secondary | ICD-10-CM

## 2020-03-20 DIAGNOSIS — E66811 Obesity, class 1: Secondary | ICD-10-CM

## 2020-03-20 LAB — COMPREHENSIVE METABOLIC PANEL
ALT: 13 U/L (ref 0–53)
AST: 11 U/L (ref 0–37)
Albumin: 3.8 g/dL (ref 3.5–5.2)
Alkaline Phosphatase: 167 U/L — ABNORMAL HIGH (ref 39–117)
BUN: 25 mg/dL — ABNORMAL HIGH (ref 6–23)
CO2: 31 mEq/L (ref 19–32)
Calcium: 9.5 mg/dL (ref 8.4–10.5)
Chloride: 98 mEq/L (ref 96–112)
Creatinine, Ser: 2.14 mg/dL — ABNORMAL HIGH (ref 0.40–1.50)
GFR: 32.6 mL/min — ABNORMAL LOW (ref >60.00)
Glucose, Bld: 347 mg/dL — ABNORMAL HIGH (ref 70–99)
Potassium: 4 mEq/L (ref 3.5–5.1)
Sodium: 135 mEq/L (ref 135–145)
Total Bilirubin: 0.6 mg/dL (ref 0.2–1.2)
Total Protein: 6.8 g/dL (ref 6.0–8.3)

## 2020-03-20 LAB — LIPID PANEL
Cholesterol: 197 mg/dL (ref 0–200)
HDL: 41.1 mg/dL (ref >39.00)
Total CHOL/HDL Ratio: 5
Triglycerides: 481 mg/dL — ABNORMAL HIGH (ref 0.0–149.0)

## 2020-03-20 LAB — CBC WITH DIFFERENTIAL/PLATELET
Basophils Absolute: 0.1 10*3/uL (ref 0.0–0.1)
Basophils Relative: 0.8 % (ref 0.0–3.0)
Eosinophils Absolute: 0.1 10*3/uL (ref 0.0–0.7)
Eosinophils Relative: 1.3 % (ref 0.0–5.0)
HCT: 40.8 % (ref 39.0–52.0)
Hemoglobin: 13.7 g/dL (ref 13.0–17.0)
Lymphocytes Relative: 14.7 % (ref 12.0–46.0)
Lymphs Abs: 1.3 10*3/uL (ref 0.7–4.0)
MCHC: 33.5 g/dL (ref 30.0–36.0)
MCV: 89.7 fl (ref 78.0–100.0)
Monocytes Absolute: 0.5 10*3/uL (ref 0.1–1.0)
Monocytes Relative: 5.9 % (ref 3.0–12.0)
Neutro Abs: 7 10*3/uL (ref 1.4–7.7)
Neutrophils Relative %: 77.3 % — ABNORMAL HIGH (ref 43.0–77.0)
Platelets: 302 10*3/uL (ref 150.0–400.0)
RBC: 4.55 Mil/uL (ref 4.22–5.81)
RDW: 14.9 % (ref 11.5–15.5)
WBC: 9.1 10*3/uL (ref 4.0–10.5)

## 2020-03-20 LAB — HEMOGLOBIN A1C: Hgb A1c MFr Bld: 13.7 % — ABNORMAL HIGH (ref 4.6–6.5)

## 2020-03-20 LAB — LDL CHOLESTEROL, DIRECT: Direct LDL: 74 mg/dL

## 2020-03-20 LAB — TSH: TSH: 1.3 u[IU]/mL (ref 0.35–4.50)

## 2020-03-20 NOTE — Assessment & Plan Note (Signed)
Pt takes losartan 25 mg daily Sees nephrologist

## 2020-03-20 NOTE — Assessment & Plan Note (Signed)
bp in fair control at this time  BP Readings from Last 1 Encounters:  03/20/20 140/80   No changes needed Most recent labs reviewed  Disc lifstyle change with low sodium diet and exercise  Under care of cardiology  Taking amlodipine 5 mg daily Hydralazine 25 mg tid imdur 120 mg daily  Losartan 25 mg daily Metoprolol xl 100 mg daily Amiloride 5 mg daily Labs today

## 2020-03-20 NOTE — Assessment & Plan Note (Signed)
Much improved Under care of podiatry

## 2020-03-20 NOTE — Patient Instructions (Addendum)
Labs today   We will make a follow up plan after lab results    Take care of yourself  Stay active  Try to get most of your carbohydrates from produce (with the exception of white potatoes)  Eat less bread/pasta/rice/snack foods/cereals/sweets and other items from the middle of the grocery store (processed carbs)

## 2020-03-20 NOTE — Assessment & Plan Note (Signed)
A1C today  Taking jardiance 25 mg daily ozempic 1 mg weekly  Humulin prn 10 u  Sees podiatry for foot ulcer  Eye exam due next month  Active job  Eats low glycemic per pt  On arb and statin

## 2020-03-20 NOTE — Progress Notes (Signed)
Subjective:    Patient ID: Roberto Allen, male    DOB: [birth year], 60 y.o.   MRN: [id number]  This visit occurred during the SARS-CoV-2 public health emergency.  Safety protocols were in place, including screening questions prior to the visit, additional usage of staff PPE, and extensive cleaning of exam room while observing appropriate contact time as indicated for disinfecting solutions.    HPI 60 yo pt of NP Carlean Purl presents to transfer care   Wt Readings from Last 3 Encounters:  03/20/20 221 lb 8 oz (100.5 kg)  11/23/19 239 lb 12 oz (108.7 kg)  08/03/19 239 lb (108.4 kg)   32.24 kg/m   Feeling tired overall  A lot of cramps in legs and hands  Takes magnesium  Also otc product - vitamins and herbs - helping (Cuprum metallicum)   Immunized for covid  Getting booster today    HTN with hx of CHF and non ischemic cardiomyopathy and mild CAD bp is stable today  No cp or palpitations or headaches or edema  No side effects to medicines  BP Readings from Last 3 Encounters:  03/20/20 140/80  02/03/20 (!) 165/87  11/23/19 124/60   Taking amlodipine 5 mg daily Hydralazine 25 mg tid imdur 120 mg daily  Losartan 25 mg daily Metoprolol xl 100 mg daily Amiloride 5 mg daily  Pulse Readings from Last 3 Encounters:  03/20/20 76  02/03/20 86  11/23/19 76   Doing well with his heart issues  Cardiologist - Dr Percival Spanish   Per pt -CHF is very well controlled  A home -systolic 250N  Checks daily   Lab Results  Component Value Date   CREATININE 2.20 (H) 02/02/2020   BUN 26 (H) 02/02/2020   NA 134 (L) 02/02/2020   K 3.2 (L) 02/02/2020   CL 98 02/02/2020   CO2 21 (L) 02/02/2020   CKD stage 3 due to DM Has to limit his fluids for chf  He sees nephrologist in [facility] -once yearly Dr Marland Kitchen     DM2 with neuropathy  Lab Results  Component Value Date   HGBA1C 7.5 (A) 08/03/2019   jardiance 25 mg daily  ozempic 1 mg weekly Humulin- prn if he eats poorly , 10 u    Thinks better control now  Glucose is usually 120s in the am  Does not check afternoons  He stopped sugar drinks - eats pretty healthy   Had podiatry visit with Dr Jacqualyn Posey for foot exam 03/13/20- h/o foof ulcer   Eye exam - is due next month - per pt no retinopathy   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 CHF (congestive heart failure) ([place]) 04/28/2019  . Hypertension associated with stage 4 chronic kidney disease due to type 2 diabetes mellitus ([place]) 08/30/2017  . Hypertension secondary to other renal disorders 11/29/2016  .  GERD (gastroesophageal reflux disease) 07/23/2016  . Degenerative disc disease at L5-S1 level 04/09/2016  . Primary osteoarthritis of right hip 04/09/2016  . Obesity, Class I, BMI 30-34.9 02/06/2016  . Ulcer of left foot ([place]) 01/06/2015  . Microalbuminuria due to type 2 diabetes mellitus ([place]) 10/03/2014  . 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 diabetes mellitus ([place]) 06/26/2014  . NICM (nonischemic cardiomyopathy) ([place]) 09/06/2013  . ED (erectile dysfunction) 03/27/2013  . Hypertriglyceridemia 12/17/2012  . Disorder of kidney and ureter 12/17/2012  . Precordial chest pain 12/16/2012  . Hyperkalemia 12/16/2012  . DM (diabetes mellitus), type 2 with renal complications ([place]) 16/96/7893  . Coronary Artery Disease (non-obstructive by cath 05/2011) 06/14/2011  . Hyperlipidemia associated with type 2 diabetes mellitus  ([place]) 06/14/2011  . OSA (obstructive sleep apnea) 05/25/2011  . Chronic systolic heart failure ([place]) 05/11/2011  . Heart failure ([place]) 04/27/2011  . Hypertension   . SOB (shortness of breath)    Past Medical History:  Diagnosis Date  . CAD (coronary artery disease)    a. LHC 4/13: mD1 25%, oD2 25%, pOM1 25%, pRCA 25%, dRCA 40%, EF 30%  . Chronic systolic heart failure (HCC)    a. EF 30-35% in 04/2011, improved to 55-60% in 01/2012.  Marland Kitchen CKD (chronic kidney disease), stage II   . COPD (chronic obstructive pulmonary disease) ([place])   . Diabetes mellitus   . GERD (gastroesophageal reflux disease)   . History of pneumonia   . Hx of cardiovascular stress test    a. Nuclear (12/2012):  No ischemia, EF 59%  . Hyperkalemia    a. 12/2012 - spironolactone, KCl supp discontinued.  . Hyperlipidemia   . Hypertension   . Hypertriglyceridemia    a. 12/2012 - started on fenofibrate.  . Knee pain   . NICM (nonischemic cardiomyopathy) ([employer])    a. Echo 3/13: mild LVH, EF 30-35%, grade 3 diast dysfxn, mod LAE;  RHC 4/13:  RA 8, RV 58/10, PA 56/18, mean 35, PCWP mean 17, LV 154/27, CO 4.3, CI 2.0. b. EF Improved >>> Echo (01/2012):  Mild LVH, EF 55-60%, no RWMA, Gr 1 DD   Past Surgical History:  Procedure Laterality Date  . CARDIAC CATHETERIZATION  2013   non-obs dz 25-40% multiple vessels  . TENOTOMY  2018   Social History   Tobacco Use  . Smoking status: Former Smoker    Packs/day: 1.00    Years: 20.00    Pack years: 20.00    Types: Cigarettes    Quit date: 09/15/2006    Years since quitting: 13.5  . Smokeless tobacco: Never Used  Vaping Use  . Vaping Use: Never used  Substance Use Topics  . Alcohol use: Yes    Comment: occ  . Drug use: No   Family History  Problem Relation Age of Onset  . Diabetes Sister   . Breast cancer Mother   . Breast cancer Maternal Grandmother    Allergies  Allergen Reactions  . Metformin And Related Other (See Comments)    Due to CHF and CRI    Current Outpatient Medications on File Prior to Visit  Medication Sig Dispense Refill  . ACCU-CHEK FASTCLIX LANCETS MISC USE TO MONITOR BLOOD GLUCOSE 3 TIME(S) DAILY  5  . aMILoride (MIDAMOR) 5 MG tablet Take 5 mg by mouth daily.    Marland Kitchen amLODipine (NORVASC) 5 MG tablet Take 1 tablet (5 mg total) by mouth daily. 30 tablet 0  . aspirin EC 81 MG EC tablet Take  1 tablet (81 mg total) by mouth daily.    Marland Kitchen atorvastatin (LIPITOR) 10 MG tablet Take 1 tablet (10 mg total) by mouth daily. 90 tablet 3  . BD PEN NEEDLE NANO U/F 32G X 4 MM MISC Inject 1 Units into the skin as needed. 100 each 3  . Blood Glucose Monitoring Suppl (ONETOUCH VERIO REFLECT) w/Device KIT 1 Units by Does not apply route as needed. 1 kit 0  . cholecalciferol (VITAMIN D) 25 MCG (1000 UNIT) tablet Take 1,000 Units by mouth daily.    . Cholecalciferol (VITAMIN D3) 50 MCG (2000 UT) TABS Take 1 tablet by mouth once a week.    . ergocalciferol (VITAMIN D2) 1.25 MG (50000 UT) capsule Take by mouth.    Marland Kitchen glucose blood (ONETOUCH VERIO) test strip 1 each by Other route as needed for other (3-4 times per day). Use as instructed 100 each 11  . HUMALOG KWIKPEN 100 UNIT/ML KwikPen SMARTSIG:15 Unit(s) SUB-Q 3 Times Daily PRN    . hydrALAZINE (APRESOLINE) 25 MG tablet Take 25 mg by mouth 3 (three) times daily.     . isosorbide mononitrate (IMDUR) 120 MG 24 hr tablet Take 1 tablet (120 mg total) by mouth daily. 30 tablet 0  . JARDIANCE 25 MG TABS tablet Take 1 tablet (25 mg total) by mouth daily. 90 tablet 1  . LANTUS SOLOSTAR 100 UNIT/ML Solostar Pen Inject 50 Units into the skin at bedtime. 15 mL 5  . losartan (COZAAR) 25 MG tablet Take 25 mg by mouth daily.    . magnesium oxide (MAG-OX) 400 (241.3 Mg) MG tablet Take 1 tablet (400 mg total) by mouth daily. 90 tablet 3  . magnesium oxide (MAG-OX) 400 MG tablet Take by mouth.    . metoprolol succinate (TOPROL-XL) 100 MG 24 hr tablet Take 100 mg by mouth daily.     . mupirocin ointment (BACTROBAN)  2 % Apply 1 application topically daily. 30 g 2  . omeprazole (PRILOSEC) 20 MG capsule Take 1 capsule (20 mg total) by mouth 2 (two) times daily before a meal. 180 capsule 0  . OZEMPIC, 1 MG/DOSE, 4 MG/3ML SOPN INJECT 1 MG UNDER SKIN ONCE A WEEK    . potassium chloride SA (KLOR-CON) 20 MEQ tablet Take 1 tablet (20 mEq total) by mouth 2 (two) times daily. 10 tablet 0  . silver sulfADIAZINE (SILVADENE) 1 % cream Apply 1 application topically daily. 50 g 0  . tadalafil (CIALIS) 20 MG tablet Take by mouth.    . [DISCONTINUED] potassium chloride (KLOR-CON) 20 MEQ packet Take 20 mEq by mouth 2 (two) times daily.     No current facility-administered medications on file prior to visit.     Review of Systems  Constitutional: Negative for activity change, appetite change, fatigue, fever and unexpected weight change.  HENT: Negative for congestion, rhinorrhea, sore throat and trouble swallowing.   Eyes: Negative for pain, redness, itching and visual disturbance.  Respiratory: Negative for cough, chest tightness, shortness of breath and wheezing.   Cardiovascular: Negative for chest pain and palpitations.  Gastrointestinal: Negative for abdominal pain, blood in stool, constipation, diarrhea and nausea.  Endocrine: Negative for cold intolerance, heat intolerance, polydipsia and polyuria.  Genitourinary: Negative for difficulty urinating, dysuria, frequency and urgency.  Musculoskeletal: Positive for arthralgias. Negative for joint swelling and myalgias.  Skin: Negative for pallor and rash.  Neurological: Negative for dizziness, tremors, weakness, numbness and headaches.  Hematological: Negative for adenopathy. Does not bruise/bleed easily.  Psychiatric/Behavioral: Negative for  decreased concentration and dysphoric mood. The patient is not nervous/anxious.        Objective:   Physical Exam Constitutional:      General: He is not in acute distress.    Appearance: Normal appearance. He is  well-developed and well-nourished. He is obese. He is not ill-appearing.  HENT:     Head: Normocephalic and atraumatic.     Mouth/Throat:     Mouth: Oropharynx is clear and moist. Mucous membranes are moist.  Eyes:     General: No scleral icterus.    Extraocular Movements: EOM normal.     Conjunctiva/sclera: Conjunctivae normal.     Pupils: Pupils are equal, round, and reactive to light.  Neck:     Thyroid: No thyromegaly.     Vascular: No carotid bruit or JVD.  Cardiovascular:     Rate and Rhythm: Normal rate and regular rhythm.     Pulses: Normal pulses and intact distal pulses.     Heart sounds: Normal heart sounds. No gallop.   Pulmonary:     Effort: Pulmonary effort is normal. No respiratory distress.     Breath sounds: Normal breath sounds. No wheezing or rales.     Comments: No crackles Abdominal:     General: Bowel sounds are normal. There is no distension or abdominal bruit.     Palpations: Abdomen is soft. There is no mass.     Tenderness: There is no abdominal tenderness.  Musculoskeletal:        General: No edema.     Cervical back: Normal range of motion and neck supple.     Right lower leg: No edema.     Left lower leg: No edema.  Lymphadenopathy:     Cervical: No cervical adenopathy.  Skin:    General: Skin is warm and dry.     Coloration: Skin is not pale.     Findings: No rash.     Comments: Healed foot ulcer- under metatarsal of R foot - with keratosis   Neurological:     Mental Status: He is alert.     Sensory: No sensory deficit.     Coordination: Coordination normal.     Deep Tendon Reflexes: Reflexes are normal and symmetric. Reflexes normal.  Psychiatric:        Mood and Affect: Mood and affect and mood normal.           Assessment & Plan:   Problem List Items Addressed This Visit      Cardiovascular and Mediastinum   Hypertension    bp in fair control at this time  BP Readings from Last 1 Encounters:  03/20/20 140/80   No changes  needed Most recent labs reviewed  Disc lifstyle change with low sodium diet and exercise  Under care of cardiology  Taking amlodipine 5 mg daily Hydralazine 25 mg tid imdur 120 mg daily  Losartan 25 mg daily Metoprolol xl 100 mg daily Amiloride 5 mg daily Labs today      Relevant Orders   Comprehensive metabolic panel   CBC with Differential/Platelet   Lipid panel   TSH   NICM (nonischemic cardiomyopathy) ([place])    Per pt well controlled Per record-also mild CAD  Working to control cholesterol and HTN  Amiloride 5 mg daily         Endocrine   Hyperlipidemia associated with type 2 diabetes mellitus ([place])    Due for lipid profile (last in epic very high triglycerides) Disc goals for lipids  and reasons to control them Rev last labs with pt Rev low sat fat diet in detail Taking atorvastatin 10 mg daily Per pt good diet       Relevant Orders   Lipid panel   DM (diabetes mellitus), type 2 with renal complications (HCC) - Primary    A1C today  Taking jardiance 25 mg daily ozempic 1 mg weekly  Humulin prn 10 u  Sees podiatry for foot ulcer  Eye exam due next month  Active job  Eats low glycemic per pt  On arb and statin       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 diabetes mellitus ([place])    Pt sees nephrology Dr Marland Kitchen in [place] today  Limits fluid intake for CHF  On arb       Relevant Orders   Comprehensive metabolic panel   Microalbuminuria due to type 2 diabetes mellitus ([place])    Pt takes losartan 25 mg daily Sees nephrologist        Other   Ulcer of left foot ([place])    Much improved Under care of podiatry      Obesity, Class I, BMI 30-34.9    Discussed how this problem influences overall health and the risks it imposes  Reviewed plan for weight loss with lower calorie diet (via better food choices and also portion control or program like weight watchers) and exercise building up to or more than 30 minutes 5 days per week including some aerobic activity

## 2020-03-20 NOTE — Assessment & Plan Note (Signed)
Per pt well controlled Per record-also mild CAD  Working to control cholesterol and HTN  Amiloride 5 mg daily

## 2020-03-20 NOTE — Assessment & Plan Note (Signed)
Due for lipid profile (last in epic very high triglycerides) Disc goals for lipids and reasons to control them Rev last labs with pt Rev low sat fat diet in detail Taking atorvastatin 10 mg daily Per pt good diet

## 2020-03-20 NOTE — Assessment & Plan Note (Signed)
Discussed how this problem influences overall health and the risks it imposes  Reviewed plan for weight loss with lower calorie diet (via better food choices and also portion control or program like weight watchers) and exercise building up to or more than 30 minutes 5 days per week including some aerobic activity    

## 2020-03-20 NOTE — Assessment & Plan Note (Signed)
Pt sees nephrology Dr Marland Kitchen in [place] today  Limits fluid intake for CHF  On arb

## 2020-04-01 ENCOUNTER — Encounter: Payer: Self-pay | Admitting: Family Medicine

## 2020-04-01 MED ORDER — MAGNESIUM OXIDE 400 (241.3 MG) MG PO TABS: 400.0000 mg | ORAL_TABLET | Freq: Every day | ORAL | 3 refills | Status: DC

## 2020-04-01 MED ORDER — HYDRALAZINE HCL 25 MG PO TABS: 25.0000 mg | ORAL_TABLET | Freq: Three times a day (TID) | ORAL | 3 refills | Status: DC

## 2020-04-15 ENCOUNTER — Ambulatory Visit: Payer: 59 | Admitting: Family Medicine

## 2020-04-21 ENCOUNTER — Other Ambulatory Visit: Payer: Self-pay | Admitting: Family Medicine

## 2020-04-24 ENCOUNTER — Ambulatory Visit: Payer: 59 | Admitting: Podiatry

## 2020-05-01 ENCOUNTER — Ambulatory Visit: Payer: 59 | Admitting: Family Medicine

## 2020-06-03 ENCOUNTER — Other Ambulatory Visit: Payer: Self-pay | Admitting: *Deleted

## 2020-06-03 ENCOUNTER — Encounter: Payer: Self-pay | Admitting: Family Medicine

## 2020-06-03 MED ORDER — TADALAFIL 20 MG PO TABS: 20.0000 mg | ORAL_TABLET | Freq: Every day | ORAL | 1 refills | Status: DC | PRN

## 2020-06-03 NOTE — Telephone Encounter (Signed)
There is a warning re: isosorbide and the generic cialis. He was on this however before I took over his care. Was he aware or had he discussed with Debbie or his cardiologist?   I take it he has not had problems so far.

## 2020-06-03 NOTE — Telephone Encounter (Signed)
Pt is requesting Rx for his tadalafil sent to [facility] in [place] due to cost, he said he usually gets 30 tabs, since this is 1st Rx PCP will fill on med will route to her for directions and review

## 2020-06-04 NOTE — Telephone Encounter (Signed)
Spoke to patient and was advised that his doctor that he was seeing before Tor Netters NP prescribed it. Patient stated that his cardiologist went over his medications with him and did not have any problems with anything that he was on. Patient stated that he has been taking this medication for over a year and has not had any problems with taking it.

## 2020-06-13 ENCOUNTER — Other Ambulatory Visit: Payer: Self-pay | Admitting: Family Medicine

## 2020-06-15 ENCOUNTER — Encounter: Payer: Self-pay | Admitting: Family Medicine

## 2020-06-16 ENCOUNTER — Other Ambulatory Visit: Payer: Self-pay

## 2020-06-16 DIAGNOSIS — N1832 Chronic kidney disease, stage 3b: Secondary | ICD-10-CM

## 2020-06-16 DIAGNOSIS — Z794 Long term (current) use of insulin: Secondary | ICD-10-CM

## 2020-06-16 MED ORDER — LANTUS SOLOSTAR 100 UNIT/ML ~~LOC~~ SOPN: 50.0000 [IU] | PEN_INJECTOR | Freq: Every day | SUBCUTANEOUS | 5 refills | Status: DC

## 2020-07-02 ENCOUNTER — Other Ambulatory Visit: Payer: Self-pay | Admitting: Family Medicine

## 2020-07-03 NOTE — Telephone Encounter (Signed)
Pharmacy requests refill on: Omeprazole 20 mg   LAST REFILL: 06/16/2020 (Q-180, R-0) LAST OV: 03/20/2020 NEXT OV: Not Scheduled  PHARMACY: [id number] [place], [place]

## 2020-07-10 ENCOUNTER — Other Ambulatory Visit: Payer: Self-pay | Admitting: *Deleted

## 2020-07-10 MED ORDER — JARDIANCE 25 MG PO TABS: 25.0000 mg | ORAL_TABLET | Freq: Every day | ORAL | 0 refills | Status: DC

## 2020-07-29 ENCOUNTER — Emergency Department: Payer: 59

## 2020-07-29 ENCOUNTER — Emergency Department
Admission: EM | Admit: 2020-07-29 | Discharge: 2020-07-29 | Disposition: A | Discharge status: Home / Self Care | Payer: 59 | Attending: Emergency Medicine | Admitting: Emergency Medicine

## 2020-07-29 ENCOUNTER — Other Ambulatory Visit: Payer: Self-pay

## 2020-07-29 DIAGNOSIS — N183 Chronic kidney disease, stage 3 unspecified: Secondary | ICD-10-CM | POA: Insufficient documentation

## 2020-07-29 DIAGNOSIS — I2510 Atherosclerotic heart disease of native coronary artery without angina pectoris: Secondary | ICD-10-CM | POA: Diagnosis not present

## 2020-07-29 DIAGNOSIS — J449 Chronic obstructive pulmonary disease, unspecified: Secondary | ICD-10-CM | POA: Diagnosis not present

## 2020-07-29 DIAGNOSIS — E1122 Type 2 diabetes mellitus with diabetic chronic kidney disease: Secondary | ICD-10-CM | POA: Diagnosis not present

## 2020-07-29 DIAGNOSIS — Z794 Long term (current) use of insulin: Secondary | ICD-10-CM | POA: Insufficient documentation

## 2020-07-29 DIAGNOSIS — Z20822 Contact with and (suspected) exposure to covid-19: Secondary | ICD-10-CM | POA: Diagnosis not present

## 2020-07-29 DIAGNOSIS — Z87891 Personal history of nicotine dependence: Secondary | ICD-10-CM | POA: Insufficient documentation

## 2020-07-29 DIAGNOSIS — I130 Hypertensive heart and chronic kidney disease with heart failure and stage 1 through stage 4 chronic kidney disease, or unspecified chronic kidney disease: Secondary | ICD-10-CM | POA: Insufficient documentation

## 2020-07-29 DIAGNOSIS — R0602 Shortness of breath: Secondary | ICD-10-CM | POA: Diagnosis not present

## 2020-07-29 DIAGNOSIS — Z79899 Other long term (current) drug therapy: Secondary | ICD-10-CM | POA: Insufficient documentation

## 2020-07-29 DIAGNOSIS — Z7982 Long term (current) use of aspirin: Secondary | ICD-10-CM | POA: Diagnosis not present

## 2020-07-29 DIAGNOSIS — I5022 Chronic systolic (congestive) heart failure: Secondary | ICD-10-CM | POA: Diagnosis not present

## 2020-07-29 LAB — BASIC METABOLIC PANEL
Anion gap: 10 (ref 5–15)
BUN: 16 mg/dL (ref 8–23)
CO2: 28 mmol/L (ref 22–32)
Calcium: 8.9 mg/dL (ref 8.9–10.3)
Chloride: 96 mmol/L — ABNORMAL LOW (ref 98–111)
Creatinine, Ser: 1.99 mg/dL — ABNORMAL HIGH (ref 0.61–1.24)
GFR, Estimated: 37 mL/min — ABNORMAL LOW (ref >60)
Glucose, Bld: 358 mg/dL — ABNORMAL HIGH (ref 70–99)
Potassium: 4 mmol/L (ref 3.5–5.1)
Sodium: 134 mmol/L — ABNORMAL LOW (ref 135–145)

## 2020-07-29 LAB — CBC
HCT: 37.4 % — ABNORMAL LOW (ref 39.0–52.0)
Hemoglobin: 12.8 g/dL — ABNORMAL LOW (ref 13.0–17.0)
MCH: 30.6 pg (ref 26.0–34.0)
MCHC: 34.2 g/dL (ref 30.0–36.0)
MCV: 89.5 fL (ref 80.0–100.0)
Platelets: 277 10*3/uL (ref 150–400)
RBC: 4.18 MIL/uL — ABNORMAL LOW (ref 4.22–5.81)
RDW: 13.2 % (ref 11.5–15.5)
WBC: 11.8 10*3/uL — ABNORMAL HIGH (ref 4.0–10.5)
nRBC: 0 % (ref 0.0–0.2)

## 2020-07-29 LAB — RESP PANEL BY RT-PCR (FLU A&B, COVID) ARPGX2
Influenza A by PCR: NEGATIVE
Influenza B by PCR: NEGATIVE
SARS Coronavirus 2 by RT PCR: NEGATIVE

## 2020-07-29 LAB — TROPONIN I (HIGH SENSITIVITY)
Troponin I (High Sensitivity): 32 ng/L — ABNORMAL HIGH (ref <18)
Troponin I (High Sensitivity): 29 ng/L — ABNORMAL HIGH (ref <18)

## 2020-07-29 MED ORDER — FUROSEMIDE 20 MG PO TABS: 20.0000 mg | ORAL_TABLET | Freq: Every day | ORAL | 0 refills | Status: DC

## 2020-07-29 NOTE — ED Triage Notes (Signed)
First RN Note: pt to ED via POV, ambulatory with steady gait, c/o increasing SOB and bilateral leg swelling.

## 2020-07-29 NOTE — ED Provider Notes (Signed)
[facility] Emergency Department Provider Note ____________________________________________   Event Date/Time   First MD Initiated Contact with Patient 07/29/20 1252     (approximate)  I have reviewed the triage vital signs and the nursing notes.   HISTORY  Chief Complaint Shortness of Breath  HPI Roberto Allen is a 60 y.o. male with history of CHF, COPD, DM presents to the emergency department for treatment and evaluation of shortness of breath. He had some lower extremity edema over the weekend, but took Lasix and that has resolved. 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 He denies fever, but has had some "soreness" in his chest "from coughing."       Past Medical History:  Diagnosis Date   CAD (coronary artery disease)    a. LHC 4/13: mD1 25%, oD2 25%, pOM1 25%, pRCA 25%, dRCA 40%, EF 30%   CHF (congestive heart failure) (HCC)    Chronic systolic heart failure (HCC)    a. EF 30-35% in 04/2011, improved to 55-60% in 01/2012.   CKD (chronic kidney disease), stage II    COPD (chronic obstructive pulmonary disease) (HCC)    Diabetes mellitus    GERD (gastroesophageal reflux disease)    History of pneumonia    Hx of cardiovascular stress test    a. Nuclear (12/2012):  No ischemia, EF 59%   Hyperkalemia    a. 12/2012 - spironolactone, KCl supp discontinued.   Hyperlipidemia    Hypertension    Hypertriglyceridemia    a. 12/2012 - started on fenofibrate.   Knee pain    NICM (nonischemic cardiomyopathy) ([place])    a. Echo 3/13: mild LVH, EF 30-35%, grade 3 diast dysfxn, mod LAE;  RHC 4/13:  RA 8, RV 58/10, PA 56/18, mean 35, PCWP mean 17, LV 154/27, CO 4.3, CI 2.0. b. EF Improved >>> Echo (01/2012):  Mild LVH, EF 55-60%, no RWMA, Gr 1 DD    Patient Active Problem List   Diagnosis Date Noted   CHF (congestive heart  failure) ([place]) 04/28/2019   Hypertension secondary to other renal disorders 11/29/2016   GERD (gastroesophageal reflux disease) 07/23/2016   Degenerative disc disease at L5-S1 level 04/09/2016   Primary osteoarthritis of right hip 04/09/2016   Obesity, Class I, BMI 30-34.9 02/06/2016   Ulcer of left foot ([place]) 01/06/2015   Microalbuminuria due to type 2 diabetes mellitus ([place]) 10/03/2014   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 diabetes mellitus ([place]) 06/26/2014   NICM (nonischemic cardiomyopathy) ([place]) 09/06/2013   ED (erectile dysfunction) 03/27/2013   Hypertriglyceridemia 12/17/2012   Disorder of kidney and ureter 12/17/2012   Precordial chest pain 12/16/2012   Hyperkalemia 12/16/2012   DM (diabetes mellitus), type 2 with renal complications ([place]) 75/91/6384   Coronary Artery Disease (non-obstructive by cath 05/2011) 06/14/2011   Hyperlipidemia associated with type 2 diabetes mellitus ([place]) 06/14/2011   OSA (obstructive sleep apnea) 66/59/9357   Chronic systolic heart failure ([place]) 05/11/2011   Heart failure ([place]) 04/27/2011   Hypertension    SOB (shortness of breath)     Past Surgical History:  Procedure Laterality Date   CARDIAC CATHETERIZATION  2013   non-obs dz 25-40% multiple vessels   TENOTOMY  2018    Prior to Admission medications   Medication Sig Start Date End Date Taking? Authorizing Provider  furosemide (LASIX) 20 MG tablet  Take 1 tablet (20 mg total) by mouth daily for 7 days. 07/29/20 08/05/20 Yes Maille Halliwell B, FNP  ACCU-CHEK FASTCLIX LANCETS MISC USE TO MONITOR BLOOD GLUCOSE 3 TIME(S) DAILY 05/05/17   [provider]  aMILoride (MIDAMOR) 5 MG tablet Take 5 mg by mouth daily. 06/06/19   [provider]  amLODipine (NORVASC) 5 MG tablet Take 1 tablet (5 mg total) by mouth daily. 05/01/19   Geradine Girt, DO  aspirin EC 81 MG EC tablet Take 1 tablet (81 mg total) by mouth daily. 05/02/19   Geradine Girt, DO  atorvastatin (LIPITOR) 10 MG tablet Take 1 tablet  (10 mg total) by mouth daily. 11/02/19   Elby Beck, FNP  BD PEN NEEDLE NANO U/F 32G X 4 MM MISC Inject 1 Units into the skin as needed. 11/05/19   Elby Beck, FNP  Blood Glucose Monitoring Suppl (ONETOUCH VERIO REFLECT) w/Device KIT 1 Units by Does not apply route as needed. 02/01/20   Elby Beck, FNP  cholecalciferol (VITAMIN D) 25 MCG (1000 UNIT) tablet Take 1,000 Units by mouth daily. 05/25/19   [provider]  Cholecalciferol (VITAMIN D3) 50 MCG (2000 UT) TABS Take 1 tablet by mouth once a week. 07/25/19   [provider]  ergocalciferol (VITAMIN D2) 1.25 MG (50000 UT) capsule Take by mouth.    [provider]  glucose blood (ONETOUCH VERIO) test strip 1 each by Other route as needed for other (3-4 times per day). Use as instructed 02/01/20   Elby Beck, FNP  HUMALOG KWIKPEN 100 UNIT/ML KwikPen SMARTSIG:15 Unit(s) SUB-Q 3 Times Daily PRN 06/19/19   [provider]  hydrALAZINE (APRESOLINE) 25 MG tablet Take 1 tablet (25 mg total) by mouth 3 (three) times daily. 04/01/20   Tower, Wynelle Fanny, MD  isosorbide mononitrate (IMDUR) 120 MG 24 hr tablet Take 1 tablet (120 mg total) by mouth daily. 05/02/19   Geradine Girt, DO  JARDIANCE 25 MG TABS tablet Take 1 tablet (25 mg total) by mouth daily. 07/10/20   Tower, Wynelle Fanny, MD  LANTUS SOLOSTAR 100 UNIT/ML Solostar Pen Inject 50 Units into the skin at bedtime. 06/16/20   Tower, Wynelle Fanny, MD  losartan (COZAAR) 25 MG tablet Take 25 mg by mouth daily.    [provider]  magnesium oxide (MAG-OX) 400 (241.3 Mg) MG tablet Take 1 tablet (400 mg total) by mouth daily. 04/01/20   Tower, Wynelle Fanny, MD  magnesium oxide (MAG-OX) 400 MG tablet Take by mouth. 05/02/19   [provider]  metoprolol succinate (TOPROL-XL) 100 MG 24 hr tablet Take 100 mg by mouth daily.  06/22/16   [provider]  mupirocin ointment (BACTROBAN) 2 % Apply 1 application topically daily. 11/26/19   McDonald, Stephan Minister,  DPM  omeprazole (PRILOSEC) 20 MG capsule TAKE 1 CAPSULE (20 MG TOTAL) BY MOUTH 2 (TWO) TIMES DAILY BEFORE A MEAL. 06/16/20   Tower, Wynelle Fanny, MD  OZEMPIC, 1 MG/DOSE, 4 MG/3ML SOPN INJECT 1 MG UNDER SKIN ONCE A WEEK 08/23/19   [provider]  potassium chloride SA (KLOR-CON) 20 MEQ tablet Take 1 tablet (20 mEq total) by mouth 2 (two) times daily. 02/03/20   Davonna Belling, MD  silver sulfADIAZINE (SILVADENE) 1 % cream Apply 1 application topically daily. 12/13/19   [name], DPM  tadalafil (CIALIS) 20 MG tablet Take 1 tablet (20 mg total) by mouth daily as needed for erectile dysfunction. 06/03/20   Tower, [place]  A, MD  potassium chloride (KLOR-CON) 20 MEQ packet Take 20 mEq by mouth 2 (two) times daily.  04/23/11  [provider]    Allergies Metformin and related  Family History  Problem Relation Age of Onset   Diabetes Sister    Breast cancer Mother    Breast cancer Maternal Grandmother     Social History Social History   Tobacco Use   Smoking status: Former    Packs/day: 1.00    Years: 20.00    Pack years: 20.00    Types: Cigarettes    Quit date: 09/15/2006    Years since quitting: 13.8   Smokeless tobacco: Never  Vaping Use   Vaping Use: Never used  Substance Use Topics   Alcohol use: Not Currently    Comment: occ   Drug use: No    Review of Systems  Constitutional: No fever/chills Eyes: No visual changes. ENT: No sore throat. Cardiovascular: Denies chest pain. Respiratory: Positive for shortness of breath. Gastrointestinal: No abdominal pain.  No nausea, no vomiting.  No diarrhea.  No constipation. Genitourinary: Negative for dysuria. Musculoskeletal: Negative for back pain. Skin: Negative for rash. Neurological: Negative for headaches, focal weakness or numbness. ____________________________________________   PHYSICAL EXAM:  VITAL SIGNS: ED Triage Vitals  Enc Vitals Group     BP 07/29/20 0929 (!) 159/88     Pulse Rate 07/29/20  0929 65     Resp 07/29/20 0929 18     Temp 07/29/20 0929 98.3 F (36.8 C)     Temp Source 07/29/20 0929 Oral     SpO2 07/29/20 0929 95 %     Weight 07/29/20 0930 224 lb (101.6 kg)     Height 07/29/20 0930 _0  (1.803 m)     Head Circumference --      Peak Flow --      Pain Score 07/29/20 0930 0     Pain Loc --      Pain Edu? --      Excl. in [place]? --     Constitutional: Alert and oriented. Well appearing and in no acute distress. Eyes: Conjunctivae are normal. Head: Atraumatic. Nose: No congestion/rhinnorhea. Mouth/Throat: Mucous membranes are moist.  Oropharynx non-erythematous. Neck: No stridor.   Hematological/Lymphatic/Immunilogical: No cervical lymphadenopathy. Cardiovascular: Normal rate, regular rhythm. Grossly normal heart sounds. Good peripheral circulation. No peripheral pitting edema of lower extremities. Respiratory: Normal respiratory effort.  No retractions. Lungs CTAB. Gastrointestinal: Soft and nontender. No distention. No abdominal bruits.  Genitourinary:  Musculoskeletal: No lower extremity tenderness nor edema.  No joint effusions. Neurologic:  Normal speech and language. No gross focal neurologic deficits are appreciated. No gait instability. Skin:  Skin is warm, dry and intact. No rash noted. Psychiatric: Mood and affect are normal. Speech and behavior are normal.  ____________________________________________   LABS (all labs ordered are listed, but only abnormal results are displayed)  Labs Reviewed  BASIC METABOLIC PANEL - Abnormal; Notable for the following components:      Result Value   Sodium 134 (*)    Chloride 96 (*)    Glucose, Bld 358 (*)    Creatinine, Ser 1.99 (*)    GFR, Estimated 37 (*)    All other components within normal limits  CBC - Abnormal; Notable for the following components:   WBC 11.8 (*)    RBC 4.18 (*)    Hemoglobin 12.8 (*)    HCT 37.4 (*)    All other components within normal limits  TROPONIN I (  HIGH SENSITIVITY) -  Abnormal; Notable for the following components:   Troponin I (High Sensitivity) 32 (*)    All other components within normal limits  TROPONIN I (HIGH SENSITIVITY) - Abnormal; Notable for the following components:   Troponin I (High Sensitivity) 29 (*)    All other components within normal limits  RESP PANEL BY RT-PCR (FLU A&B, COVID) [id number]   ____________________________________________  EKG  ED ECG REPORT I, Woodrow Drab, FNP-BC personally viewed and interpreted this ECG.   Date: 07/29/2020  EKG Time: 0950  Rate: 67  Rhythm: normal sinus rhythm, 1st degree AV block  Axis: normal  Intervals:first-degree A-V block   ST&T Change: no ST elevation  ____________________________________________  RADIOLOGY  ED MD interpretation:    Minimal bibasilar atelectasis with small effusions.  I, Sherrie George, personally viewed and evaluated these images (plain radiographs) as part of my medical decision making, as well as reviewing the written report by the radiologist.  Official radiology report(s): DG Chest 2 View  Result Date: 07/29/2020 CLINICAL DATA:  Shortness of breath EXAM: CHEST - 2 VIEW COMPARISON:  04/28/2019 FINDINGS: Cardiac shadow is stable. The lungs are well aerated bilaterally with mild bibasilar atelectasis right greater than left. Minimal effusions are noted as well. Degenerative changes of the thoracic spine are seen. IMPRESSION: Minimal bibasilar atelectasis with small effusions. Electronically Signed   By: Inez Catalina M.D.   On: 07/29/2020 09:56    ____________________________________________   PROCEDURES  Procedure(s) performed (including Critical Care):  Procedures  ____________________________________________   INITIAL IMPRESSION / ASSESSMENT AND PLAN     60 year old male presenting to the emergency department for treatment and evaluation of shortness of breath and concern for COVID. See HPI.  DIFFERENTIAL DIAGNOSIS  COVID, CHF exacerbation,  CAD  ED COURSE  COVID is negative. No evidence of acute CHF. Exam is reassuring. Will have him take Lasix for 2 consecutive days and go back to PRN. He is to follow up with PCP/heart failure clinic. If symptoms change or worsen, he is to return to the ER.    ___________________________________________   FINAL CLINICAL IMPRESSION(S) / ED DIAGNOSES  Final diagnoses:  SOB (shortness of breath)     ED Discharge Orders          Ordered    furosemide (LASIX) 20 MG tablet  Daily        07/29/20 1321             Roberto Allen was evaluated in Emergency Department on 07/29/2020 for the symptoms described in the history of present illness. He was evaluated in the context of the global COVID-19 pandemic, which necessitated consideration that the patient might be at risk for infection with the SARS-CoV-2 virus that causes COVID-19. Institutional protocols and algorithms that pertain to the evaluation of patients at risk for COVID-19 are in a state of rapid change based on information released by regulatory bodies including the CDC and federal and state organizations. These policies and algorithms were followed during the patient's care in the ED.   Note:  This document was prepared using Dragon voice recognition software and may include unintentional dictation errors.    Victorino Dike, FNP 07/29/20 1640    Duffy Bruce, MD 07/31/20 [phone]

## 2020-07-29 NOTE — ED Triage Notes (Signed)
Pt c/o SOB over the past 2 days, worse with exertion with BL LE edema, states he has a hx of CHF but they took him off fluid pills due to kidney issues.the patient is in NAD at present,

## 2020-08-06 ENCOUNTER — Telehealth: Payer: Self-pay | Admitting: Family Medicine

## 2020-08-06 ENCOUNTER — Encounter: Payer: Self-pay | Admitting: Family Medicine

## 2020-08-06 ENCOUNTER — Ambulatory Visit (INDEPENDENT_AMBULATORY_CARE_PROVIDER_SITE_OTHER): Payer: 59 | Admitting: Family Medicine

## 2020-08-06 ENCOUNTER — Telehealth: Payer: Self-pay | Admitting: Cardiovascular Disease

## 2020-08-06 ENCOUNTER — Other Ambulatory Visit: Payer: Self-pay

## 2020-08-06 VITALS — BP 162/98 | HR 74 | Temp 97.7°F | Ht 71.0 in | Wt 234.0 lb

## 2020-08-06 DIAGNOSIS — I509 Heart failure, unspecified: Secondary | ICD-10-CM

## 2020-08-06 DIAGNOSIS — I5022 Chronic systolic (congestive) heart failure: Secondary | ICD-10-CM | POA: Diagnosis not present

## 2020-08-06 DIAGNOSIS — R0602 Shortness of breath: Secondary | ICD-10-CM

## 2020-08-06 MED ORDER — OZEMPIC (1 MG/DOSE) 4 MG/3ML ~~LOC~~ SOPN: PEN_INJECTOR | SUBCUTANEOUS | 3 refills | Status: DC

## 2020-08-06 NOTE — Progress Notes (Signed)
Subjective:    Patient ID: Roberto Allen, male    DOB: Jul 08, 1960, 60 y.o.   MRN: [id number]  This visit occurred during the SARS-CoV-2 public health emergency.  Safety protocols were in place, including screening questions prior to the visit, additional usage of staff PPE, and extensive cleaning of exam room while observing appropriate contact time as indicated for disinfecting solutions.   HPI Pt presents for chest congestion and sob   Wt Readings from Last 3 Encounters:  08/06/20 234 lb (106.1 kg)  07/29/20 224 lb (101.6 kg)  03/20/20 221 lb 8 oz (100.5 kg)   32.64 kg/m  He was seen in ER on 07/29/20 H/o CHF and copd and DM Presented with sob and ankle edema   Lab Results  Component Value Date   CREATININE 1.99 (H) 07/29/2020   BUN 16 07/29/2020   NA 134 (L) 07/29/2020   K 4.0 07/29/2020   CL 96 (L) 07/29/2020   CO2 28 07/29/2020   Lab Results  Component Value Date   WBC 11.8 (H) 07/29/2020   HGB 12.8 (L) 07/29/2020   HCT 37.4 (L) 07/29/2020   MCV 89.5 07/29/2020   PLT 277 07/29/2020   Troponin 32 and 29 EKG nsr with first deg AV block  DG Chest 2 View  Result Date: 07/29/2020 CLINICAL DATA:  Shortness of breath EXAM: CHEST - 2 VIEW COMPARISON:  04/28/2019 FINDINGS: Cardiac shadow is stable. The lungs are well aerated bilaterally with mild bibasilar atelectasis right greater than left. Minimal effusions are noted as well. Degenerative changes of the thoracic spine are seen. IMPRESSION: Minimal bibasilar atelectasis with small effusions. Electronically Signed   By: Inez Catalina M.D.   On: 07/29/2020 09:56    Covid neg and flu neg  No ev of acute chf  Adv lasix for 2 d   Used to smoke -years ago  Smoked 10-15 y ago  Now  Still sob /not improved   Most of the time- worse getting up in the am  Occ wheezing (feels like congestion in chest)  Pulse ox 95%  No coughing anything up  No sinus symptoms No fever   Cardiologist signed off in the past (pt thought)  but per review is due for f/u 3/21 echo- some LV enlargement with nl EF H/o non ischemic cardiomyopathy -? Cause (pt does not know)   Patient Active Problem List   Diagnosis Date Noted   CHF (congestive heart failure) ([place]) 04/28/2019   Hypertension secondary to other renal disorders 11/29/2016   GERD (gastroesophageal reflux disease) 07/23/2016   Degenerative disc disease at L5-S1 level 04/09/2016   Primary osteoarthritis of right hip 04/09/2016   Obesity, Class I, BMI 30-34.9 02/06/2016   Ulcer of left foot ([place]) 01/06/2015   Microalbuminuria due to type 2 diabetes mellitus ([place]) 10/03/2014   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 diabetes mellitus ([place]) 06/26/2014   NICM (nonischemic cardiomyopathy) ([place]) 09/06/2013   ED (erectile dysfunction) 03/27/2013   Hypertriglyceridemia 12/17/2012   Disorder of kidney and ureter 12/17/2012   Precordial chest pain 12/16/2012   Hyperkalemia 12/16/2012   DM (diabetes mellitus), type 2 with renal complications ([place]) 01/74/9449   Coronary Artery Disease (non-obstructive by cath 05/2011) 06/14/2011   Hyperlipidemia associated with type 2 diabetes mellitus ([place]) 06/14/2011   OSA (obstructive sleep apnea) 67/59/1638   Chronic systolic heart failure ([place]) 05/11/2011   Heart failure ([place]) 04/27/2011   Hypertension    SOB (shortness of breath)    Past Medical  History:  Diagnosis Date   CAD (coronary artery disease)    a. LHC 4/13: mD1 25%, oD2 25%, pOM1 25%, pRCA 25%, dRCA 40%, EF 30%   CHF (congestive heart failure) (HCC)    Chronic systolic heart failure (HCC)    a. EF 30-35% in 04/2011, improved to 55-60% in 01/2012.   CKD (chronic kidney disease), stage II    COPD (chronic obstructive pulmonary disease) (HCC)    Diabetes mellitus    GERD (gastroesophageal reflux disease)    History of pneumonia    Hx of cardiovascular stress test    a. Nuclear (12/2012):  No ischemia, EF 59%   Hyperkalemia    a. 12/2012 - spironolactone, KCl supp discontinued.    Hyperlipidemia    Hypertension    Hypertriglyceridemia    a. 12/2012 - started on fenofibrate.   Knee pain    NICM (nonischemic cardiomyopathy) ([place])    a. Echo 3/13: mild LVH, EF 30-35%, grade 3 diast dysfxn, mod LAE;  RHC 4/13:  RA 8, RV 58/10, PA 56/18, mean 35, PCWP mean 17, LV 154/27, CO 4.3, CI 2.0. b. EF Improved >>> Echo (01/2012):  Mild LVH, EF 55-60%, no RWMA, Gr 1 DD   Past Surgical History:  Procedure Laterality Date   CARDIAC CATHETERIZATION  2013   non-obs dz 25-40% multiple vessels   TENOTOMY  2018   Social History   Tobacco Use   Smoking status: Former    Packs/day: 1.00    Years: 20.00    Pack years: 20.00    Types: Cigarettes    Quit date: 09/15/2006    Years since quitting: 13.9   Smokeless tobacco: Never  Vaping Use   Vaping Use: Never used  Substance Use Topics   Alcohol use: Not Currently    Comment: occ   Drug use: No   Family History  Problem Relation Age of Onset   Diabetes Sister    Breast cancer Mother    Breast cancer Maternal Grandmother    Allergies  Allergen Reactions   Metformin And Related Other (See Comments)    Due to CHF and CRI   Current Outpatient Medications on File Prior to Visit  Medication Sig Dispense Refill   ACCU-CHEK FASTCLIX LANCETS MISC USE TO MONITOR BLOOD GLUCOSE 3 TIME(S) DAILY  5   aMILoride (MIDAMOR) 5 MG tablet Take 5 mg by mouth daily.     amLODipine (NORVASC) 5 MG tablet Take 1 tablet (5 mg total) by mouth daily. 30 tablet 0   aspirin EC 81 MG EC tablet Take 1 tablet (81 mg total) by mouth daily.     atorvastatin (LIPITOR) 10 MG tablet Take 1 tablet (10 mg total) by mouth daily. 90 tablet 3   BD PEN NEEDLE NANO U/F 32G X 4 MM MISC Inject 1 Units into the skin as needed. 100 each 3   Blood Glucose Monitoring Suppl (ONETOUCH VERIO REFLECT) w/Device KIT 1 Units by Does not apply route as needed. 1 kit 0   cholecalciferol (VITAMIN D) 25 MCG (1000 UNIT) tablet Take 1,000 Units by mouth daily.     Cholecalciferol  (VITAMIN D3) 50 MCG (2000 UT) TABS Take 1 tablet by mouth once a week.     ergocalciferol (VITAMIN D2) 1.25 MG (50000 UT) capsule Take by mouth.     glucose blood (ONETOUCH VERIO) test strip 1 each by Other route as needed for other (3-4 times per day). Use as instructed 100 each [place]  100 UNIT/ML KwikPen SMARTSIG:15 Unit(s) SUB-Q 3 Times Daily PRN     hydrALAZINE (APRESOLINE) 25 MG tablet Take 1 tablet (25 mg total) by mouth 3 (three) times daily. 270 tablet 3   isosorbide mononitrate (IMDUR) 120 MG 24 hr tablet Take 1 tablet (120 mg total) by mouth daily. 30 tablet 0   JARDIANCE 25 MG TABS tablet Take 1 tablet (25 mg total) by mouth daily. 90 tablet 0   LANTUS SOLOSTAR 100 UNIT/ML Solostar Pen Inject 50 Units into the skin at bedtime. 15 mL 5   losartan (COZAAR) 25 MG tablet Take 25 mg by mouth daily.     magnesium oxide (MAG-OX) 400 (241.3 Mg) MG tablet Take 1 tablet (400 mg total) by mouth daily. 90 tablet 3   metoprolol succinate (TOPROL-XL) 100 MG 24 hr tablet Take 100 mg by mouth daily.      mupirocin ointment (BACTROBAN) 2 % Apply 1 application topically daily. 30 g 2   omeprazole (PRILOSEC) 20 MG capsule TAKE 1 CAPSULE (20 MG TOTAL) BY MOUTH 2 (TWO) TIMES DAILY BEFORE A MEAL. 180 capsule 0   potassium chloride SA (KLOR-CON) 20 MEQ tablet Take 1 tablet (20 mEq total) by mouth 2 (two) times daily. 10 tablet 0   silver sulfADIAZINE (SILVADENE) 1 % cream Apply 1 application topically daily. 50 g 0   tadalafil (CIALIS) 20 MG tablet Take 1 tablet (20 mg total) by mouth daily as needed for erectile dysfunction. 30 tablet 1   furosemide (LASIX) 20 MG tablet Take 1 tablet (20 mg total) by mouth daily for 7 days. 7 tablet 0   [DISCONTINUED] potassium chloride (KLOR-CON) 20 MEQ packet Take 20 mEq by mouth 2 (two) times daily.     No current facility-administered medications on file prior to visit.    Review of Systems  Constitutional:  Positive for fatigue. Negative for activity  change, appetite change, fever and unexpected weight change.  HENT:  Negative for congestion, rhinorrhea, sore throat and trouble swallowing.   Eyes:  Negative for pain, redness, itching and visual disturbance.  Respiratory:  Positive for cough. Negative for chest tightness, shortness of breath and wheezing.   Cardiovascular:  Positive for leg swelling. Negative for chest pain and palpitations.  Gastrointestinal:  Negative for abdominal pain, blood in stool, constipation, diarrhea and nausea.  Endocrine: Negative for cold intolerance, heat intolerance, polydipsia and polyuria.  Genitourinary:  Negative for difficulty urinating, dysuria, frequency and urgency.  Musculoskeletal:  Negative for arthralgias, joint swelling and myalgias.  Skin:  Negative for pallor and rash.  Neurological:  Negative for dizziness, tremors, weakness, numbness and headaches.  Hematological:  Negative for adenopathy. Does not bruise/bleed easily.  Psychiatric/Behavioral:  Negative for decreased concentration and dysphoric mood. The patient is not nervous/anxious.       Objective:   Physical Exam Constitutional:      General: He is not in acute distress.    Appearance: He is well-developed. He is obese. He is not ill-appearing.     Comments: Noted wt is up 10 lb  HENT:     Head: Normocephalic and atraumatic.  Eyes:     Conjunctiva/sclera: Conjunctivae normal.     Pupils: Pupils are equal, round, and reactive to light.  Neck:     Thyroid: No thyromegaly.     Vascular: No carotid bruit or JVD.  Cardiovascular:     Rate and Rhythm: Normal rate and regular rhythm.     Heart sounds: Normal heart sounds. No murmur heard.  No gallop.  Pulmonary:     Effort: Pulmonary effort is normal. No respiratory distress.     Breath sounds: Normal breath sounds. No stridor. No wheezing or rales.     Comments: Bs are mildly distant at bases  No wheeze or rales  No crackles  Abdominal:     General: Bowel sounds are normal.  There is no distension or abdominal bruit.     Palpations: Abdomen is soft. There is no mass.     Tenderness: There is no abdominal tenderness.  Musculoskeletal:     Cervical back: Normal range of motion and neck supple. No tenderness.     Right lower leg: Edema present.     Left lower leg: Edema present.     Comments: One plus pedal edema   Lymphadenopathy:     Cervical: No cervical adenopathy.  Skin:    General: Skin is warm and dry.     Coloration: Skin is not pale.     Findings: No erythema or rash.  Neurological:     Mental Status: He is alert.     Coordination: Coordination normal.     Deep Tendon Reflexes: Reflexes are normal and symmetric. Reflexes normal.  Psychiatric:        Mood and Affect: Mood normal.          Assessment & Plan:   Problem List Items Addressed This Visit       Cardiovascular and Mediastinum   Acute exacerbation of CHF (congestive heart failure) (HCC) - Primary    1-2 wk hx of sob and chest tightness worse when lying down  Seen in ER on 6/14 with some small effusions on cxr and was adv to take lasix daily instead of prn Today he still c/o of sob with pnd/orthopnea and pulse ox of 95% on ra , and no wheezing on exam  Suspect this Is cardiac in nature  Reviewed hospital records, lab results and studies in detail   Pt admittedly knows little about his cardiac history or tx  Ref made for urgent cardiac follow up  Enc to continue furosemide at 20 mg daily  inst to call if symptoms worsen/ER if severe           Other   SOB (shortness of breath)    Suspect due to exac of CHF  Back on lasix  Urgent ref made for cardiology f/u       Relevant Orders   Ambulatory referral to Cardiology

## 2020-08-06 NOTE — Telephone Encounter (Signed)
This patient is fairly new to me.  He was quite symptomatic and told me he cannot lie down or he cannot breathe.  Since I am not familiar with him (and he seemed to have a rather poor understanding of his heart history) I felt like he needed to be seen soon to r/o a cardiac cause.  I suspect it is his heart failure but cannot be 100% sure   Renal disease complicates things in terms of safe diuresis   Thanks

## 2020-08-06 NOTE — Telephone Encounter (Signed)
error 

## 2020-08-06 NOTE — Telephone Encounter (Signed)
Spoke with Roberto Allen, he reports since leaving the hospital he really has not had a change in his symptoms. He continues to have SOB when walking out to the car, or if talking for awhile. He is unable to lie flat to sleep and is propping up with pillows. He reports the SOB is worse in the morning when he wakes but then once he moves around it does get some better. He has some swelling in his ankles by the end of the day but in the mornings it is not completely gone as it has been. His weight at home on his scales was 119 lb and today at the doctors office it was 132 lb, but he is not sure that is right. He reports watching salt and fluids. Patient instructed to double his furosemide for the next 3 days. After 3 days if his symptoms have not improved or change the patient was told to make sure to call us back. Patient voiced understanding and agreed with this plan. Aware dr croitoru is not here this week but will make him aware of what is going on.

## 2020-08-06 NOTE — Telephone Encounter (Signed)
Roberto Allen called in wanted to know about the referral that was sent in as an urgent referral - and he just saw a pa on 5/21 and wanted to know about the urgency unless something has changed , the Dr. Gwenlyn Found stated that he just needed to up his lasic medication. And he has an appointment on 7/13 with Dr. Loletha Grayer his Heart doctor

## 2020-08-06 NOTE — Telephone Encounter (Signed)
[place] returning a call to [facility]. They do not need a call back. Just called to inform of Dr. Marliss Coots response to a note in the patient's chart.

## 2020-08-06 NOTE — Telephone Encounter (Signed)
Spoke to patient he stated he already spoke to someone earlier.Advised I spoke to DOD Dr.Berry he advised to double Lasix dose for the next 3 days then return to normal dose.Advised to schedule appointment with Dr.Croitoru.Appointment scheduled with Dr.Croitoru 08/27/20 at 4:30 pm.Advised to call back if he does improve or go to ED.

## 2020-08-06 NOTE — Assessment & Plan Note (Signed)
1-2 wk hx of sob and chest tightness worse when lying down  Seen in ER on 6/14 with some small effusions on cxr and was adv to take lasix daily instead of prn Today he still c/o of sob with pnd/orthopnea and pulse ox of 95% on ra , and no wheezing on exam  Suspect this Is cardiac in nature  Reviewed hospital records, lab results and studies in detail   Pt admittedly knows little about his cardiac history or tx  Ref made for urgent cardiac follow up  Enc to continue furosemide at 20 mg daily  inst to call if symptoms worsen/ER if severe

## 2020-08-06 NOTE — Patient Instructions (Addendum)
Take your lasix  I wonder if the heart failure is causing your shortness of breath   I placed an urgent referral to cardiology so you will get a call    If symptoms suddenly worsen-go to the hospital

## 2020-08-06 NOTE — Assessment & Plan Note (Signed)
Suspect due to exac of CHF  Back on lasix  Urgent ref made for cardiology f/u

## 2020-08-08 ENCOUNTER — Other Ambulatory Visit: Payer: Self-pay | Admitting: Cardiovascular Disease

## 2020-08-08 NOTE — Telephone Encounter (Signed)
*  STAT* If patient is at the pharmacy, call can be transferred to refill team.   1. Which medications need to be refilled? (please list name of each medication and dose if known) furosemide (LASIX) 40 MG tablet  2. Which pharmacy/location (including street and city if local pharmacy) is medication to be sent to? [id number] [place], [place] - [address]. Do they need a 30 day or 90 day supply? 90 day   Patient states he was called by DHilda Bladesand told to go back on it. He states he only has 1 tablet left.

## 2020-08-11 NOTE — Addendum Note (Signed)
Addended by: Orma Render on: 08/11/2020 06:15 PM   Modules accepted: Orders

## 2020-08-12 ENCOUNTER — Encounter: Payer: Self-pay | Admitting: Family Medicine

## 2020-08-12 MED ORDER — FUROSEMIDE 20 MG PO TABS: 20.0000 mg | ORAL_TABLET | Freq: Every day | ORAL | 0 refills | Status: DC

## 2020-08-12 NOTE — Telephone Encounter (Signed)
Please refill 20 mg tabs, OK to take 2x20 mg prn fluid excess, 90 day supply, no RF. Needs appt first available w me or APP.

## 2020-08-12 NOTE — Telephone Encounter (Signed)
He has appt 08/27/2020

## 2020-08-27 ENCOUNTER — Ambulatory Visit (INDEPENDENT_AMBULATORY_CARE_PROVIDER_SITE_OTHER): Payer: 59 | Admitting: Cardiovascular Disease

## 2020-08-27 ENCOUNTER — Encounter: Payer: Self-pay | Admitting: Cardiovascular Disease

## 2020-08-27 ENCOUNTER — Other Ambulatory Visit: Payer: Self-pay

## 2020-08-27 VITALS — BP 142/84 | HR 77 | Ht 71.0 in | Wt 227.0 lb

## 2020-08-27 DIAGNOSIS — E1122 Type 2 diabetes mellitus with diabetic chronic kidney disease: Secondary | ICD-10-CM | POA: Diagnosis not present

## 2020-08-27 DIAGNOSIS — I10 Essential (primary) hypertension: Secondary | ICD-10-CM

## 2020-08-27 DIAGNOSIS — G4733 Obstructive sleep apnea (adult) (pediatric): Secondary | ICD-10-CM | POA: Diagnosis not present

## 2020-08-27 DIAGNOSIS — N183 Chronic kidney disease, stage 3 unspecified: Secondary | ICD-10-CM

## 2020-08-27 DIAGNOSIS — I5032 Chronic diastolic (congestive) heart failure: Secondary | ICD-10-CM

## 2020-08-27 DIAGNOSIS — I2510 Atherosclerotic heart disease of native coronary artery without angina pectoris: Secondary | ICD-10-CM | POA: Diagnosis not present

## 2020-08-27 DIAGNOSIS — Z794 Long term (current) use of insulin: Secondary | ICD-10-CM

## 2020-08-27 DIAGNOSIS — N1832 Chronic kidney disease, stage 3b: Secondary | ICD-10-CM

## 2020-08-27 MED ORDER — LOSARTAN POTASSIUM 50 MG PO TABS: 50.0000 mg | ORAL_TABLET | Freq: Every day | ORAL | 1 refills | Status: DC

## 2020-08-27 NOTE — Patient Instructions (Signed)
Medication Instructions:  INCREASE the Losartan to 50 mg once daily  *If you need a refill on your cardiac medications before your next appointment, please call your pharmacy*   Lab Work: Your provider would like for you to return in 2 weeks at your next appointment to have the following labs drawn: BMET. You do not need an appointment for the lab. Once in our office lobby there is a podium where you can sign in and ring the doorbell to alert [place] that you are here. The lab is open from 8:00 am to 4:30 pm; closed for lunch from 12:45pm-1:45pm.  If you have labs (blood work) drawn today and your tests are completely normal, you will receive your results only by: [place] (if you have MyChart) OR A paper copy in the mail If you have any lab test that is abnormal or we need to change your treatment, we will call you to review the results.   Testing/Procedures: None ordered   Follow-Up: At [facility], you and your health needs are our priority.  As part of our continuing mission to provide you with exceptional heart care, we have created designated Provider Care Teams.  These Care Teams include your primary Cardiologist (physician) and Advanced Practice Providers (APPs -  Physician Assistants and Nurse Practitioners) who all work together to provide you with the care you need, when you need it.  We recommend signing up for the patient portal called "MyChart".  Sign up information is provided on this After Visit Summary.  MyChart is used to connect with patients for Virtual Visits (Telemedicine).  Patients are able to view lab/test results, encounter notes, upcoming appointments, etc.  Non-urgent messages can be sent to your provider as well.   To learn more about what you can do with MyChart, go to [email or web address].    Your next appointment:   Follow in 2 weeks with pharmD Follow in 2 months with APP

## 2020-08-27 NOTE — Progress Notes (Signed)
Cardiology Office Note:    Date:  08/28/2020   ID:  Roberto Allen, DOB 08/10/1960, MRN [id number]  PCP:  Abner Greenspan, MD   [facility] Providers Cardiologist:  Sanda Klein, MD     Referring MD: Abner Greenspan, MD   Chief Complaint  Patient presents with   Congestive Heart Failure    History of Present Illness:    Roberto Allen is a 60 y.o. male with a hx of nonobstructive CAD (cath 2013), nonschemic cardiomyopathy, history of systolic heart failure with LVEF 30-35%, now improved to 50-55%, DM with CKD stage IIIb, HTN, HLD, GERD, OSA (not using CPAP due to lack of insurance), COPD.  He was recently hospitalized in March 2021 with an episode of heart failure exacerbation associated with severely elevated blood pressure.  On his home scale today he weighed 219 pounds (there is a big discrepancy with our office scale which shows 227 pounds).  Discharge weight after CHF admission was 101 kilograms (223 pounds).  He does not have any edema, but he continues to have NYHA functional class II shortness of breath (walking on flat terrain and at a slightly faster speed), although some days he is a little more short of breath NYHA functional class III (getting dressed).  He does not have orthopnea or PND.  He denies palpitations, dizziness or syncope.  He has not had focal neurological complaints or claudication.  He denies headaches.  Currently on a complicated list of antihypertensive medications that includes medicines useful in congestive heart failure (low-dose losartan, metoprolol, hydralazine/long-acting nitrates), loop diuretic which she takes daily, but also medications not usually avoiding heart failure such as amiloride and amlodipine.  Has some difficulties with his midday dose of hydralazine, but otherwise reports compliance with these medications.  He is taking SGLT2 inhibitors for his diabetes, in addition to insulin.  Past Medical History:  Diagnosis Date   CAD (coronary  artery disease)    a. LHC 4/13: mD1 25%, oD2 25%, pOM1 25%, pRCA 25%, dRCA 40%, EF 30%   CHF (congestive heart failure) (HCC)    Chronic systolic heart failure (HCC)    a. EF 30-35% in 04/2011, improved to 55-60% in 01/2012.   CKD (chronic kidney disease), stage II    COPD (chronic obstructive pulmonary disease) (HCC)    Diabetes mellitus    GERD (gastroesophageal reflux disease)    History of pneumonia    Hx of cardiovascular stress test    a. Nuclear (12/2012):  No ischemia, EF 59%   Hyperkalemia    a. 12/2012 - spironolactone, KCl supp discontinued.   Hyperlipidemia    Hypertension    Hypertriglyceridemia    a. 12/2012 - started on fenofibrate.   Knee pain    NICM (nonischemic cardiomyopathy) ([place])    a. Echo 3/13: mild LVH, EF 30-35%, grade 3 diast dysfxn, mod LAE;  RHC 4/13:  RA 8, RV 58/10, PA 56/18, mean 35, PCWP mean 17, LV 154/27, CO 4.3, CI 2.0. b. EF Improved >>> Echo (01/2012):  Mild LVH, EF 55-60%, no RWMA, Gr 1 DD    Past Surgical History:  Procedure Laterality Date   CARDIAC CATHETERIZATION  2013   non-obs dz 25-40% multiple vessels   TENOTOMY  2018    Current Medications: Current Meds  Medication Sig   ACCU-CHEK FASTCLIX LANCETS MISC USE TO MONITOR BLOOD GLUCOSE 3 TIME(S) DAILY   amLODipine (NORVASC) 5 MG tablet Take 1 tablet (5 mg total) by mouth daily.   aspirin EC  81 MG EC tablet Take 1 tablet (81 mg total) by mouth daily.   atorvastatin (LIPITOR) 10 MG tablet Take 1 tablet (10 mg total) by mouth daily.   BD PEN NEEDLE NANO U/F 32G X 4 MM MISC Inject 1 Units into the skin as needed.   Blood Glucose Monitoring Suppl (ONETOUCH VERIO REFLECT) w/Device KIT 1 Units by Does not apply route as needed.   furosemide (LASIX) 20 MG tablet Take 1 tablet (20 mg total) by mouth daily for 7 days.   glucose blood (ONETOUCH VERIO) test strip 1 each by Other route as needed for other (3-4 times per day). Use as instructed   HUMALOG KWIKPEN 100 UNIT/ML KwikPen SMARTSIG:15  Unit(s) SUB-Q 3 Times Daily PRN   hydrALAZINE (APRESOLINE) 25 MG tablet Take 1 tablet (25 mg total) by mouth 3 (three) times daily.   isosorbide mononitrate (IMDUR) 120 MG 24 hr tablet Take 1 tablet (120 mg total) by mouth daily.   JARDIANCE 25 MG TABS tablet Take 1 tablet (25 mg total) by mouth daily.   LANTUS SOLOSTAR 100 UNIT/ML Solostar Pen Inject 50 Units into the skin at bedtime.   magnesium oxide (MAG-OX) 400 (241.3 Mg) MG tablet Take 1 tablet (400 mg total) by mouth daily.   metoprolol succinate (TOPROL-XL) 100 MG 24 hr tablet Take 100 mg by mouth daily.    omeprazole (PRILOSEC) 20 MG capsule TAKE 1 CAPSULE (20 MG TOTAL) BY MOUTH 2 (TWO) TIMES DAILY BEFORE A MEAL.   OZEMPIC, 1 MG/DOSE, 4 MG/3ML SOPN INJECT 1 MG UNDER SKIN ONCE A WEEK   potassium chloride SA (KLOR-CON) 20 MEQ tablet Take 1 tablet (20 mEq total) by mouth 2 (two) times daily.   tadalafil (CIALIS) 20 MG tablet Take 1 tablet (20 mg total) by mouth daily as needed for erectile dysfunction.   [DISCONTINUED] losartan (COZAAR) 25 MG tablet Take 25 mg by mouth daily.     Allergies:   Metformin and related   Social History   Socioeconomic History   Marital status: Married    Spouse name: Not on file   Number of children: 6   Years of education: Not on file   Highest education level: Not on file  Occupational History   Occupation: [employer]    Comment: Dentist: [employer]  Tobacco Use   Smoking status: Former    Packs/day: 1.00    Years: 20.00    Pack years: 20.00    Types: Cigarettes    Quit date: 09/15/2006    Years since quitting: 13.9   Smokeless tobacco: Never  Vaping Use   Vaping Use: Never used  Substance and Sexual Activity   Alcohol use: Not Currently    Comment: occ   Drug use: No   Sexual activity: Yes  Other Topics Concern   Not on file  Social History Narrative   Lives with wife.   Social Determinants of Health   Financial Resource Strain: Not on file  Food  Insecurity: Not on file  Transportation Needs: Not on file  Physical Activity: Not on file  Stress: Not on file  Social Connections: Not on file     Family History: The patient's family history includes Breast cancer in his maternal grandmother and mother; Diabetes in his sister.  ROS:   Please see the history of present illness.     All other systems reviewed and are negative.  EKGs/Labs/Other Studies Reviewed:    The following studies were  reviewed today: Echocardiogram 04/28/2019     1. Left ventricular ejection fraction, by estimation, is 50 to 55%. The  left ventricle has low normal function. The left ventricle demonstrates  regional wall motion abnormalities (see scoring diagram/findings for  description). There is mild left  ventricular hypertrophy. Left ventricular diastolic parameters are  indeterminate.   2. Right ventricular systolic function is normal. The right ventricular  size is normal. Tricuspid regurgitation signal is inadequate for assessing  PA pressure.   3. The mitral valve is normal in structure. No evidence of mitral valve  regurgitation. No evidence of mitral stenosis.   4. The aortic valve is normal in structure. Aortic valve regurgitation is  trivial. No aortic stenosis is present.   5. The inferior vena cava is normal in size with greater than 50%  respiratory variability, suggesting right atrial pressure of 3 mmHg.     EKG:  EKG is ordered today.  The ekg ordered today demonstrates sinus rhythm with first-degree AV block, borderline QTC 470 ms, otherwise no  Recent Labs: 02/02/2020: Magnesium 2.0 03/20/2020: ALT 13; TSH 1.30 07/29/2020: BUN 16; Creatinine, Ser 1.99; Hemoglobin 12.8; Platelets 277; Potassium 4.0; Sodium 134  Recent Lipid Panel    Component Value Date/Time   CHOL 197 03/20/2020 1005   TRIG (H) 03/20/2020 1005    481.0 Triglyceride is over 400; calculations on Lipids are invalid.   HDL 41.10 03/20/2020 1005   CHOLHDL 5  03/20/2020 1005   VLDL UNABLE TO CALCULATE IF TRIGLYCERIDE OVER 400 mg/dL 12/17/2012 0145   LDLCALC UNABLE TO CALCULATE IF TRIGLYCERIDE OVER 400 mg/dL 12/17/2012 0145   LDLDIRECT 74.0 03/20/2020 1005     Risk Assessment/Calculations:           Physical Exam:    VS:  BP (!) 142/84 (BP Location: Left Arm, Patient Position: Sitting, Cuff Size: Large)   Pulse 77   Ht 5' 11"  (1.803 m)   Wt 227 lb (103 kg)   SpO2 97%   BMI 31.66 kg/m     Wt Readings from Last 3 Encounters:  08/27/20 227 lb (103 kg)  08/06/20 234 lb (106.1 kg)  07/29/20 224 lb (101.6 kg)     GEN:  Well nourished, well developed in no acute distress HEENT: Normal NECK: No JVD; No carotid bruits LYMPHATICS: No lymphadenopathy CARDIAC: RRR, no murmurs, rubs, gallops RESPIRATORY:  Clear to auscultation without rales, wheezing or rhonchi  ABDOMEN: Soft, non-tender, non-distended MUSCULOSKELETAL:  No edema; No deformity  SKIN: Warm and dry NEUROLOGIC:  Alert and oriented x 3 PSYCHIATRIC:  Normal affect   ASSESSMENT:    1. Chronic diastolic heart failure ([place])   2. Essential hypertension   3. Type 2 diabetes mellitus with stage 3b chronic kidney disease, with long-term current use of insulin ([place])   4. 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 diabetes mellitus ([place])   5. OSA (obstructive sleep apnea)   6. Coronary artery disease involving native coronary artery of native heart without angina pectoris    PLAN:    In order of problems listed above:  CHF: Predominantly diastolic dysfunction with near normal EF.  We will try to simplify his regimen of medications, focusing on reaching the maximum tolerated dose of ARB, while monitoring kidney function.  Increase to losartan 50 mg daily today.  Bring back for labs and a PharmD consultation in a couple of weeks.  If renal function and blood pressure allow increase losartan to 100 mg daily, may even consider switching  to [facility] although his EF is not that bad.  We will then try to  maximize the dose of hydralazine and hopefully discontinue amlodipine and amiloride completely.  Appears clinically euvolemic today and is only on a low-dose of loop diuretic.  Reviewed the importance of sodium restriction and daily weight monitoring. HTN: Prefer use of medication is useful and heart failure. DM: When last checked he had poor control, hemoglobin A1c 13.7%.  Nonfasting glucose was 358 in June.  Continue empagliflozin in addition to other diabetes medications. CKD3b: Slight creatinine seems to be 120-2.0, with recent increase to the mid twos.  Monitor carefully while we are adjusting medication. OSA: Encouraged him to use the CPAP regularly. CAD: He does not have angina pectoris.  On his most recent echo in the inferior wall was described as hypokinetic, but his EF has actually improved from previous assessments.  The threshold for performing invasive angiography is high since he has fairly advanced kidney disease.  Catheterization in 2013 did not show meaningful obstruction and nuclear stress test in 2014 showed normal perfusion.  Consider repeat nuclear perfusion study after we finish adjusting his heart failure medication.        Medication Adjustments/Labs and Tests Ordered: Current medicines are reviewed at length with the patient today.  Concerns regarding medicines are outlined above.  Orders Placed This Encounter  Procedures   Basic metabolic panel   EKG 15-BWIO   Meds ordered this encounter  Medications   losartan (COZAAR) 50 MG tablet    Sig: Take 1 tablet (50 mg total) by mouth daily.    Dispense:  30 tablet    Refill:  1    Patient Instructions  Medication Instructions:  INCREASE the Losartan to 50 mg once daily  *If you need a refill on your cardiac medications before your next appointment, please call your pharmacy*   Lab Work: Your provider would like for you to return in 2 weeks at your next appointment to have the following labs drawn: BMET. You do  not need an appointment for the lab. Once in our office lobby there is a podium where you can sign in and ring the doorbell to alert [place] that you are here. The lab is open from 8:00 am to 4:30 pm; closed for lunch from 12:45pm-1:45pm.  If you have labs (blood work) drawn today and your tests are completely normal, you will receive your results only by: [place] (if you have MyChart) OR A paper copy in the mail If you have any lab test that is abnormal or we need to change your treatment, we will call you to review the results.   Testing/Procedures: None ordered   Follow-Up: At [facility], you and your health needs are our priority.  As part of our continuing mission to provide you with exceptional heart care, we have created designated Provider Care Teams.  These Care Teams include your primary Cardiologist (physician) and Advanced Practice Providers (APPs -  Physician Assistants and Nurse Practitioners) who all work together to provide you with the care you need, when you need it.  We recommend signing up for the patient portal called "MyChart".  Sign up information is provided on this After Visit Summary.  MyChart is used to connect with patients for Virtual Visits (Telemedicine).  Patients are able to view lab/test results, encounter notes, upcoming appointments, etc.  Non-urgent messages can be sent to your provider as well.   To learn more about what you can do with  MyChart, go to [email or web address].    Your next appointment:   Follow in 2 weeks with pharmD Follow in 2 months with APP   Signed, Sanda Klein, MD  08/28/2020 8:15 AM    [place]

## 2020-08-28 ENCOUNTER — Encounter: Payer: Self-pay | Admitting: Cardiovascular Disease

## 2020-09-02 ENCOUNTER — Other Ambulatory Visit: Payer: Self-pay | Admitting: Family Medicine

## 2020-09-11 ENCOUNTER — Telehealth: Payer: Self-pay | Admitting: Podiatry

## 2020-09-11 ENCOUNTER — Other Ambulatory Visit: Payer: Self-pay | Admitting: Podiatry

## 2020-09-11 MED ORDER — DOXYCYCLINE HYCLATE 100 MG PO TABS
100.0000 mg | ORAL_TABLET | Freq: Two times a day (BID) | ORAL | 0 refills | Status: DC
Start: 2020-09-11 — End: 2020-12-19

## 2020-09-11 NOTE — Telephone Encounter (Signed)
Patient has some right foot drainage and it has an odor to it, he would like to have some antibiotics called in. I did go ahead and schedule him for an appointment for tomorrow as well.

## 2020-09-12 ENCOUNTER — Ambulatory Visit (INDEPENDENT_AMBULATORY_CARE_PROVIDER_SITE_OTHER): Payer: 59 | Admitting: Podiatry

## 2020-09-12 ENCOUNTER — Other Ambulatory Visit: Payer: Self-pay

## 2020-09-12 DIAGNOSIS — L97512 Non-pressure chronic ulcer of other part of right foot with fat layer exposed: Secondary | ICD-10-CM

## 2020-09-12 DIAGNOSIS — E08621 Diabetes mellitus due to underlying condition with foot ulcer: Secondary | ICD-10-CM

## 2020-09-12 DIAGNOSIS — E1142 Type 2 diabetes mellitus with diabetic polyneuropathy: Secondary | ICD-10-CM

## 2020-09-12 DIAGNOSIS — L03115 Cellulitis of right lower limb: Secondary | ICD-10-CM

## 2020-09-14 ENCOUNTER — Other Ambulatory Visit: Payer: Self-pay | Admitting: Family Medicine

## 2020-09-16 NOTE — Progress Notes (Signed)
Subjective: 60 year old male presents the office with his wife for concerns of a wound on his right foot.  His wife states that about 1 to 2 weeks ago swelling started and he had some drainage coming from the area.  They called yesterday with concerns of this he presents today for follow-up.  No increase in swelling or redness of the foot.  Denies any fevers or chills.  No pus pockets bloody to clear drainage at times.  He was started on doxycycline for which she started last night.  Objective: AAO x3, NAD DP/PT pulses palpable bilaterally, CRT less than 3 seconds On the right foot submetatarsal 1 is a large hyperkeratotic lesion.  Upon debridement there is superficial area of skin breakdown with macerated tissue.  The callus itself measures approximately 3 x 3 cm but the wound itself is smaller but there is macerated tissue along the entire underlying of the wound.  There is no probing, undermining or tunneling.  No surrounding erythema, ascending cellulitis.  No fluctuance or crepitation.  There is no malodor.  No open lesions or pre-ulcerative lesions.  No pain with calf compression, swelling, warmth, erythema  Assessment: Right foot ulceration  Plan: -All treatment options discussed with the patient including all alternatives, risks, complications.  -Sharply debrided the wound today utilizing the 312 with scalpel down to healthy, bleeding, granular tissue remove nonviable devitalized tissue to help promote wound healing.  There was no blood loss.  Tolerated well.  Recommended Betadine dressing changes daily given the maceration.  Continue doxycycline and offloading.  Encouraged elevation. -Encourage glucose control.  His last A1c on March 20, 2018 2022 was significantly elevated at 13.7 which is up from 7.5. -Monitor for any clinical signs or symptoms of infection and directed to call the office immediately should any occur or go to the ER. -Patient encouraged to call the office with any  questions, concerns, change in symptoms.   [name] DPM

## 2020-09-18 ENCOUNTER — Ambulatory Visit: Payer: 59

## 2020-09-18 ENCOUNTER — Telehealth: Payer: Self-pay

## 2020-09-18 ENCOUNTER — Other Ambulatory Visit: Payer: Self-pay

## 2020-09-18 DIAGNOSIS — I2510 Atherosclerotic heart disease of native coronary artery without angina pectoris: Secondary | ICD-10-CM

## 2020-09-18 DIAGNOSIS — I10 Essential (primary) hypertension: Secondary | ICD-10-CM

## 2020-09-18 LAB — BASIC METABOLIC PANEL
BUN/Creatinine Ratio: 10 (ref 10–24)
BUN: 23 mg/dL (ref 8–27)
CO2: 24 mmol/L (ref 20–29)
Calcium: 9.3 mg/dL (ref 8.6–10.2)
Chloride: 105 mmol/L (ref 96–106)
Creatinine, Ser: 2.37 mg/dL — ABNORMAL HIGH (ref 0.76–1.27)
Glucose: 177 mg/dL — ABNORMAL HIGH (ref 65–99)
Potassium: 3.8 mmol/L (ref 3.5–5.2)
Sodium: 145 mmol/L — ABNORMAL HIGH (ref 134–144)
eGFR: 30 mL/min/{1.73_m2} — ABNORMAL LOW (ref >59)

## 2020-09-18 NOTE — Telephone Encounter (Signed)
LMOM FOR Missed appt r/s

## 2020-09-18 NOTE — Progress Notes (Deleted)
09/18/2020 Roberto Allen May 01, 1960 [id number]   HPI:  Roberto Allen is a 60 y.o. male patient of Dr Sallyanne Kuster, with a PMH below who presents today for heart failure medication titration.   He was hospitalized in March 2021 with HF exacerbation associated with elevated blood pressure and pneumonia.  Most recent EF was at   Past Medical History: CAD Non-obstructive (cath 213)  DM2 A1c 13.7 (2/22), now on Humalog, Jardiance, Lantus, Ozempic  CKD Stage IIIb GFR 37 (6/22)   Hypertension On metoprolol succ 100 mg, losartan 50 mg, hydralazine 25 mg tid, amiloride 5 mg, amlodipine  hyperlipidemia   OSA Unable to afford CPAP due to lack of insurance     Blood Pressure Goal:  130/80  Current Medications:  Family Hx:  Social Hx:   Diet:   Exercise:   Home BP readings:   Intolerances:   Labs:    Wt Readings from Last 3 Encounters:  08/27/20 227 lb (103 kg)  08/06/20 234 lb (106.1 kg)  07/29/20 224 lb (101.6 kg)   BP Readings from Last 3 Encounters:  08/27/20 (!) 142/84  08/06/20 (!) 162/98  07/29/20 (!) 171/91   Pulse Readings from Last 3 Encounters:  08/27/20 77  08/06/20 74  07/29/20 (!) 52    Current Outpatient Medications  Medication Sig Dispense Refill   ACCU-CHEK FASTCLIX LANCETS MISC USE TO MONITOR BLOOD GLUCOSE 3 TIME(S) DAILY  5   aMILoride (MIDAMOR) 5 MG tablet Take 5 mg by mouth daily.     amLODipine (NORVASC) 5 MG tablet Take 1 tablet (5 mg total) by mouth daily. 30 tablet 0   aspirin EC 81 MG EC tablet Take 1 tablet (81 mg total) by mouth daily.     atorvastatin (LIPITOR) 10 MG tablet Take 1 tablet (10 mg total) by mouth daily. 90 tablet 3   BD PEN NEEDLE NANO U/F 32G X 4 MM MISC Inject 1 Units into the skin as needed. 100 each 3   Blood Glucose Monitoring Suppl (ONETOUCH VERIO REFLECT) w/Device KIT 1 Units by Does not apply route as needed. 1 kit 0   cholecalciferol (VITAMIN D) 25 MCG (1000 UNIT) tablet Take 1,000 Units by mouth daily. (Patient not  taking: Reported on 08/27/2020)     Cholecalciferol (VITAMIN D3) 50 MCG (2000 UT) TABS Take 1 tablet by mouth once a week. (Patient not taking: Reported on 08/27/2020)     doxycycline (VIBRA-TABS) 100 MG tablet Take 1 tablet (100 mg total) by mouth 2 (two) times daily. 20 tablet 0   ergocalciferol (VITAMIN D2) 1.25 MG (50000 UT) capsule Take by mouth. (Patient not taking: Reported on 08/27/2020)     furosemide (LASIX) 20 MG tablet Take 1 tablet (20 mg total) by mouth daily for 7 days. 7 tablet 0   glucose blood (ONETOUCH VERIO) test strip 1 each by Other route as needed for other (3-4 times per day). Use as instructed 100 each 11   HUMALOG KWIKPEN 100 UNIT/ML KwikPen SMARTSIG:15 Unit(s) SUB-Q 3 Times Daily PRN     hydrALAZINE (APRESOLINE) 25 MG tablet Take 1 tablet (25 mg total) by mouth 3 (three) times daily. 270 tablet 3   isosorbide mononitrate (IMDUR) 120 MG 24 hr tablet Take 1 tablet (120 mg total) by mouth daily. 30 tablet 0   JARDIANCE 25 MG TABS tablet TAKE 1 TABLET (25 MG TOTAL) BY MOUTH DAILY. 90 tablet 0   LANTUS SOLOSTAR 100 UNIT/ML Solostar Pen Inject 50 Units into the skin  at bedtime. 15 mL 5   losartan (COZAAR) 50 MG tablet Take 1 tablet (50 mg total) by mouth daily. 30 tablet 1   magnesium oxide (MAG-OX) 400 (241.3 Mg) MG tablet Take 1 tablet (400 mg total) by mouth daily. 90 tablet 3   metoprolol succinate (TOPROL-XL) 100 MG 24 hr tablet Take 100 mg by mouth daily.      mupirocin ointment (BACTROBAN) 2 % Apply 1 application topically daily. (Patient not taking: Reported on 08/27/2020) 30 g 2   omeprazole (PRILOSEC) 20 MG capsule TAKE 1 CAPSULE (20 MG TOTAL) BY MOUTH 2 (TWO) TIMES DAILY BEFORE A MEAL. 180 capsule 0   OZEMPIC, 1 MG/DOSE, 4 MG/3ML SOPN INJECT 1 MG UNDER SKIN ONCE A WEEK 3 mL 3   potassium chloride SA (KLOR-CON) 20 MEQ tablet Take 1 tablet (20 mEq total) by mouth 2 (two) times daily. 10 tablet 0   silver sulfADIAZINE (SILVADENE) 1 % cream Apply 1 application topically  daily. (Patient not taking: Reported on 08/27/2020) 50 g 0   tadalafil (CIALIS) 20 MG tablet Take 1 tablet (20 mg total) by mouth daily as needed for erectile dysfunction. 30 tablet 1   No current facility-administered medications for this visit.    Allergies  Allergen Reactions   Metformin And Related Other (See Comments)    Due to CHF and CRI    Past Medical History:  Diagnosis Date   CAD (coronary artery disease)    a. LHC 4/13: mD1 25%, oD2 25%, pOM1 25%, pRCA 25%, dRCA 40%, EF 30%   CHF (congestive heart failure) (HCC)    Chronic systolic heart failure (HCC)    a. EF 30-35% in 04/2011, improved to 55-60% in 01/2012.   CKD (chronic kidney disease), stage II    COPD (chronic obstructive pulmonary disease) (HCC)    Diabetes mellitus    GERD (gastroesophageal reflux disease)    History of pneumonia    Hx of cardiovascular stress test    a. Nuclear (12/2012):  No ischemia, EF 59%   Hyperkalemia    a. 12/2012 - spironolactone, KCl supp discontinued.   Hyperlipidemia    Hypertension    Hypertriglyceridemia    a. 12/2012 - started on fenofibrate.   Knee pain    NICM (nonischemic cardiomyopathy) ([place])    a. Echo 3/13: mild LVH, EF 30-35%, grade 3 diast dysfxn, mod LAE;  RHC 4/13:  RA 8, RV 58/10, PA 56/18, mean 35, PCWP mean 17, LV 154/27, CO 4.3, CI 2.0. b. EF Improved >>> Echo (01/2012):  Mild LVH, EF 55-60%, no RWMA, Gr 1 DD    There were no vitals taken for this visit.  No problem-specific Assessment & Plan notes found for this encounter.   Tommy Medal PharmD CPP [facility] [address] [place], [place] [address] [phone]

## 2020-09-19 ENCOUNTER — Other Ambulatory Visit: Payer: Self-pay | Admitting: Cardiovascular Disease

## 2020-09-22 ENCOUNTER — Ambulatory Visit: Payer: 59 | Admitting: Podiatry

## 2020-09-25 ENCOUNTER — Encounter: Payer: Self-pay | Admitting: *Deleted

## 2020-10-20 ENCOUNTER — Other Ambulatory Visit: Payer: Self-pay | Admitting: Cardiovascular Disease

## 2020-10-24 ENCOUNTER — Telehealth: Payer: Self-pay | Admitting: Family Medicine

## 2020-10-28 ENCOUNTER — Telehealth: Payer: Self-pay | Admitting: Family Medicine

## 2020-10-28 DIAGNOSIS — Z794 Long term (current) use of insulin: Secondary | ICD-10-CM

## 2020-10-28 DIAGNOSIS — N1832 Chronic kidney disease, stage 3b: Secondary | ICD-10-CM

## 2020-10-28 MED ORDER — ATORVASTATIN CALCIUM 10 MG PO TABS: 10.0000 mg | ORAL_TABLET | Freq: Every day | ORAL | 1 refills | Status: DC

## 2020-10-28 NOTE — Telephone Encounter (Signed)
Pt need refill on atorvastatin (LIPITOR) 10 MG tablet

## 2020-10-28 NOTE — Telephone Encounter (Signed)
Duplicate request.  Already refilled earlier today.

## 2020-11-11 NOTE — Progress Notes (Deleted)
Cardiology Office Note:    Date:  11/11/2020   ID:  Roberto Allen, DOB [birth year], MRN [id number]  PCP:  Roberto Greenspan, MD  Cardiologist:  Roberto Klein, MD  Electrophysiologist:  None   Referring MD: Roberto Greenspan, MD   Chief Complaint: follow-up of CHF and hypertension  History of Present Illness:    Roberto Allen is a 60 y.o. male with a history of non-obstructive CAD on cardiac catheterization in 2013, non-ischemic cardiomyopathy/ chronic systolic CHF with EF as low as 30-35% in 2013 but improved to 50-55% on Echo in 04/2019, hypertension, hyperlipidemia, type 2 diabetes mellitus, CKD stage III, GERD, COPD, and obstructive sleep apnea on CPAP*** who is followed by Dr. Sallyanne Allen and presents today for follow-up of CHF and hypertension.   Patient has a history of chronic systolic CHF dating back to 2013. Found to have an EF of 30-35% at that time. R/LHC showed non-obstructive CAD and mild to moderately elevated pulmonary pressures. She was started on GDMT and EF normalized to 55-60% on repeat Echo in 01/2012. Myoview in 12/2012 showed no evidence of ischemia. Patient was admitted in 04/2019 with CHF exacerbation. Echo at that time showed LVEF of 50-55% with hypokinesis of the entire inferior wall, normal RV, and no significant valvular disease.   Patient was last seen by Dr. Sallyanne Allen in 08/2020 at which time he continued to have shortness of breath with exertion but was overall stable from a cardiac standpoint. His BP was above goal and he was noted to be on a complicated list of antihypertensives that include GDMT for CHF and medications usually avoided in CHF including Amlodipine and Amiloride. Plan was to try to simplify medication regimen by focusing on reaching the maximum dose of ARB and then maximize dose of Hydralazine with hopes to discontinue Amlodipine and Amiloride completely. Patient was supposed to follow-up in our PharmD clinic after this visit but missed this appointment.    Patient presents today for follow-up. ***  Non-Ischemic Cardiomyopathy Chronic Systolic CHF - LVEF as low as 30-35% in 2013 but has since normalized. Most recent Echo in 04/2019 showed LVEF of 50-55% with hypokinesis of the entire inferior wall, normal RV, and no significant valvular disease. - Euvolemic on exam.  - Continue Lasix '20mg'$  daily. *** - Continue Losartan '50mg'$  daily. - Continue Toprol-XL '100mg'$  daily.  - Continue Hydralazine '25mg'$  three times daily. *** - Continue Imdur '120mg'$  daily.  - Continue Jardiance '25mg'$  daily.  - Also on Amiloride '5mg'$  daily. *** - Continue daily weights and sodium/fluid restrictions.   Non-Obstructive CAD - Mild CAD noted on cardiac cath in 2013.  - No angina. - Continue aspirin, beta-blocker, and statin.   Hypertension - BP well controlled. - Continue medications for CHF as above. Also on Amlodipine '5mg'$  daily which we will continue. ***  Hyperlipidemia - Lipid panel in 03/2020: Total Cholesterol 197, Triglycerides 481, HDL 41. Direct LDL 74.  - LDL goal <70 given CAD. - Currently on Lipitor '10mg'$  daily. Will increase to '40mg'$  daily. - Will recheck lipid panel/LFTs in 2 months. If triglycerides to elevated, will need to consider adding Vascepa/Lovaza.   Poorly Controlled Type 2 Diabetes Mellitus - Hemoglobin A1c 13.7 in 03/2020. - On Jardiance, Ozempic, and Insulin.  - Management per PCP.  CKD Stage IIIb - Baseline creatinine 1.9 to 2.4.  - Stable at 2.37 on last check in 09/2020.   Obstructive Sleep Apnea - Continue CPAP.   Past Medical History:  Diagnosis Date   CAD (  coronary artery disease)    a. LHC 4/13: mD1 25%, oD2 25%, pOM1 25%, pRCA 25%, dRCA 40%, EF 30%   CHF (congestive heart failure) (HCC)    Chronic systolic heart failure (HCC)    a. EF 30-35% in 04/2011, improved to 55-60% in 01/2012.   CKD (chronic kidney disease), stage II    COPD (chronic obstructive pulmonary disease) (HCC)    Diabetes mellitus    GERD  (gastroesophageal reflux disease)    History of pneumonia    Hx of cardiovascular stress test    a. Nuclear (12/2012):  No ischemia, EF 59%   Hyperkalemia    a. 12/2012 - spironolactone, KCl supp discontinued.   Hyperlipidemia    Hypertension    Hypertriglyceridemia    a. 12/2012 - started on fenofibrate.   Knee pain    NICM (nonischemic cardiomyopathy) ([facility])    a. Echo 3/13: mild LVH, EF 30-35%, grade 3 diast dysfxn, mod LAE;  RHC 4/13:  RA 8, RV 58/10, PA 56/18, mean 35, PCWP mean 17, LV 154/27, CO 4.3, CI 2.0. b. EF Improved >>> Echo (01/2012):  Mild LVH, EF 55-60%, no RWMA, Gr 1 DD    Past Surgical History:  Procedure Laterality Date   CARDIAC CATHETERIZATION  2013   non-obs dz 25-40% multiple vessels   TENOTOMY  2018    Current Medications: No outpatient medications have been marked as taking for the 11/19/20 encounter (Appointment) with Darreld Mclean, PA-C.     Allergies:   Metformin and related   Social History   Socioeconomic History   Marital status: Married    Spouse name: Not on file   Number of children: 6   Years of education: Not on file   Highest education level: Not on file  Occupational History   Occupation: [employer]    Comment: Dentist: [place]  Tobacco Use   Smoking status: Former    Packs/day: 1.00    Years: 20.00    Pack years: 20.00    Types: Cigarettes    Quit date: 09/15/2006    Years since quitting: 14.1   Smokeless tobacco: Never  Vaping Use   Vaping Use: Never used  Substance and Sexual Activity   Alcohol use: Not Currently    Comment: occ   Drug use: No   Sexual activity: Yes  Other Topics Concern   Not on file  Social History Narrative   Lives with wife.   Social Determinants of Health   Financial Resource Strain: Not on file  Food Insecurity: Not on file  Transportation Needs: Not on file  Physical Activity: Not on file  Stress: Not on file  Social Connections: Not on file     Family  History: The patient's family history includes Breast cancer in his maternal grandmother and mother; Diabetes in his sister.  ROS:   Please see the history of present illness.     EKGs/Labs/Other Studies Reviewed:    The following studies were reviewed today:  Echocardiogram 05/04/2011: Study Conclusions: - Left ventricle: The cavity size was normal. Wall thickness    was increased in a pattern of mild LVH. Systolic function    was moderately to severely reduced. The estimated ejection    fraction was in the range of 30% to 35%. Diffuse    hypokinesis. Doppler parameters are consistent with a    reversible restrictive pattern, indicative of decreased    left ventricular diastolic compliance and/or increased  left atrial pressure (grade 3 diastolic dysfunction).  - Aortic valve: Trivial regurgitation.  - Left atrium: The atrium was moderately dilated.  - Pericardium, extracardiac: A trivial pericardial effusion    was identified.  _______________  Right/Left Cardiac Catheterization 05/28/2011: Coronary Angiography: Coronary dominance: Right   Left mainstem: Normal Left anterior descending (LAD): The LAD wrapped the apex. There were no high grade lesions. There was diffuse luminal irregularity. The first diagonal was moderate size with 25% mid stenosis. Second diagonal is moderate size with ostial 25% stenosis. Left circumflex (LCx): AV groove moderate-sized with luminal irregularities. Ramus intermediate moderate sized and normal. First obtuse marginal large with proximal 25% stenosis. Second and third obtuse marginal small and normal. Posterior lateral small and normal. Right coronary artery (RCA): Dominant. Long proximal 25% stenosis. Distal 40% stenosis before the PDA. PDA was small to moderate size no high grade   Left ventriculography: Left ventricular systolic function is normal, LVEF is estimated at 30%, there is no significant mitral regurgitation    Final Conclusions:   Nonobstructive coronary disease. Moderately severe global left ventricular dysfunction. He has mild to moderately elevated pulmonary pressures. _______________  Myoview 12/17/2012: Impression: Normal myocardial perfusion examination. No evidence for  pharmacological induced ischemia.   Calculated ejection fraction is 59% with normal wall motion.  _______________  Echocardiogram 04/28/2019: Impressions: 1. Left ventricular ejection fraction, by estimation, is 50 to 55%. The  left ventricle has low normal function. The left ventricle demonstrates  regional wall motion abnormalities (see scoring diagram/findings for  description). There is mild left  ventricular hypertrophy. Left ventricular diastolic parameters are  indeterminate.   2. Right ventricular systolic function is normal. The right ventricular  size is normal. Tricuspid regurgitation signal is inadequate for assessing  PA pressure.   3. The mitral valve is normal in structure. No evidence of mitral valve  regurgitation. No evidence of mitral stenosis.   4. The aortic valve is normal in structure. Aortic valve regurgitation is  trivial. No aortic stenosis is present.   5. The inferior vena cava is normal in size with greater than 50%  respiratory variability, suggesting right atrial pressure of 3 mmHg.  EKG:  EKG not ordered today.  Recent Labs: 02/02/2020: Magnesium 2.0 03/20/2020: ALT 13; TSH 1.30 07/29/2020: Hemoglobin 12.8; Platelets 277 09/18/2020: BUN 23; Creatinine, Ser 2.37; Potassium 3.8; Sodium 145  Recent Lipid Panel    Component Value Date/Time   CHOL 197 03/20/2020 1005   TRIG (H) 03/20/2020 1005    481.0 Triglyceride is over 400; calculations on Lipids are invalid.   HDL 41.10 03/20/2020 1005   CHOLHDL 5 03/20/2020 1005   VLDL UNABLE TO CALCULATE IF TRIGLYCERIDE OVER 400 mg/dL 12/17/2012 0145   LDLCALC UNABLE TO CALCULATE IF TRIGLYCERIDE OVER 400 mg/dL 12/17/2012 0145   LDLDIRECT 74.0 03/20/2020 1005     Physical Exam:    Vital Signs: There were no vitals taken for this visit.    Wt Readings from Last 3 Encounters:  08/27/20 227 lb (103 kg)  08/06/20 234 lb (106.1 kg)  07/29/20 224 lb (101.6 kg)     General: 60 y.o. male in no acute distress. HEENT: Normocephalic and atraumatic. Sclera clear. EOMs intact. Neck: Supple. No carotid bruits. No JVD. Heart: *** RRR. Distinct S1 and S2. No murmurs, gallops, or rubs. Radial and distal pedal pulses 2+ and equal bilaterally. Lungs: No increased work of breathing. Clear to ausculation bilaterally. No wheezes, rhonchi, or rales.  Abdomen: Soft, non-distended, and non-tender to palpation.  Bowel sounds present in all 4 quadrants.  MSK: Normal strength and tone for age. *** Extremities: No lower extremity edema.    Skin: Warm and dry. Neuro: Alert and oriented x3. No focal deficits. Psych: Normal affect. Responds appropriately.   Assessment:    No diagnosis found.  Plan:     Disposition: Follow up in ***   Medication Adjustments/Labs and Tests Ordered: Current medicines are reviewed at length with the patient today.  Concerns regarding medicines are outlined above.  No orders of the defined types were placed in this encounter.  No orders of the defined types were placed in this encounter.   There are no Patient Instructions on file for this visit.   Signed, Darreld Mclean, PA-C  11/11/2020 2:57 PM    [facility]

## 2020-11-16 ENCOUNTER — Other Ambulatory Visit: Payer: Self-pay | Admitting: Family Medicine

## 2020-11-16 ENCOUNTER — Other Ambulatory Visit: Payer: Self-pay | Admitting: Cardiovascular Disease

## 2020-11-18 ENCOUNTER — Encounter: Payer: Self-pay | Admitting: Student

## 2020-11-19 ENCOUNTER — Ambulatory Visit: Payer: 59 | Admitting: Student

## 2020-11-25 ENCOUNTER — Other Ambulatory Visit: Payer: Self-pay | Admitting: Family Medicine

## 2020-11-25 NOTE — Telephone Encounter (Signed)
Patient is overdue for diabetic follow up with Dr Glori Bickers, please schedule. Thank you

## 2020-11-26 NOTE — Telephone Encounter (Signed)
Med refilled once  

## 2020-11-26 NOTE — Telephone Encounter (Signed)
Called pat and got him scheduled 10/14 at 8

## 2020-11-28 ENCOUNTER — Other Ambulatory Visit: Payer: Self-pay

## 2020-11-28 ENCOUNTER — Encounter: Payer: Self-pay | Admitting: Family Medicine

## 2020-11-28 ENCOUNTER — Other Ambulatory Visit: Payer: Self-pay | Admitting: Family Medicine

## 2020-11-28 ENCOUNTER — Ambulatory Visit (INDEPENDENT_AMBULATORY_CARE_PROVIDER_SITE_OTHER): Payer: 59 | Admitting: Family Medicine

## 2020-11-28 VITALS — BP 132/86 | HR 79 | Temp 98.0°F | Ht 71.0 in | Wt 225.0 lb

## 2020-11-28 DIAGNOSIS — Z23 Encounter for immunization: Secondary | ICD-10-CM

## 2020-11-28 DIAGNOSIS — L97521 Non-pressure chronic ulcer of other part of left foot limited to breakdown of skin: Secondary | ICD-10-CM | POA: Diagnosis not present

## 2020-11-28 DIAGNOSIS — N1832 Chronic kidney disease, stage 3b: Secondary | ICD-10-CM

## 2020-11-28 DIAGNOSIS — I10 Essential (primary) hypertension: Secondary | ICD-10-CM | POA: Diagnosis not present

## 2020-11-28 DIAGNOSIS — Z794 Long term (current) use of insulin: Secondary | ICD-10-CM

## 2020-11-28 DIAGNOSIS — E1169 Type 2 diabetes mellitus with other specified complication: Secondary | ICD-10-CM | POA: Diagnosis not present

## 2020-11-28 DIAGNOSIS — N183 Chronic kidney disease, stage 3 unspecified: Secondary | ICD-10-CM

## 2020-11-28 DIAGNOSIS — E1142 Type 2 diabetes mellitus with diabetic polyneuropathy: Secondary | ICD-10-CM

## 2020-11-28 DIAGNOSIS — E1122 Type 2 diabetes mellitus with diabetic chronic kidney disease: Secondary | ICD-10-CM

## 2020-11-28 DIAGNOSIS — E785 Hyperlipidemia, unspecified: Secondary | ICD-10-CM

## 2020-11-28 LAB — COMPREHENSIVE METABOLIC PANEL
ALT: 13 U/L (ref 0–53)
AST: 15 U/L (ref 0–37)
Albumin: 3.9 g/dL (ref 3.5–5.2)
Alkaline Phosphatase: 118 U/L — ABNORMAL HIGH (ref 39–117)
BUN: 23 mg/dL (ref 6–23)
CO2: 30 mEq/L (ref 19–32)
Calcium: 9.3 mg/dL (ref 8.4–10.5)
Chloride: 100 mEq/L (ref 96–112)
Creatinine, Ser: 2.52 mg/dL — ABNORMAL HIGH (ref 0.40–1.50)
GFR: 26.66 mL/min — ABNORMAL LOW (ref >60.00)
Glucose, Bld: 168 mg/dL — ABNORMAL HIGH (ref 70–99)
Potassium: 4.1 mEq/L (ref 3.5–5.1)
Sodium: 138 mEq/L (ref 135–145)
Total Bilirubin: 0.6 mg/dL (ref 0.2–1.2)
Total Protein: 6.8 g/dL (ref 6.0–8.3)

## 2020-11-28 LAB — LIPID PANEL
Cholesterol: 261 mg/dL — ABNORMAL HIGH (ref 0–200)
HDL: 42.1 mg/dL (ref >39.00)
Total CHOL/HDL Ratio: 6
Triglycerides: 507 mg/dL — ABNORMAL HIGH (ref 0.0–149.0)

## 2020-11-28 LAB — LDL CHOLESTEROL, DIRECT: Direct LDL: 120 mg/dL

## 2020-11-28 LAB — POCT GLYCOSYLATED HEMOGLOBIN (HGB A1C): Hemoglobin A1C: 14.7 % — AB (ref 4.0–5.6)

## 2020-11-28 MED ORDER — ONETOUCH VERIO REFLECT W/DEVICE KIT: 1.0000 [IU] | PACK | 0 refills | Status: DC | PRN

## 2020-11-28 NOTE — Assessment & Plan Note (Signed)
Lab today Disc goals for lipids and reasons to control them Rev last labs with pt Rev low sat fat diet in detail Takes atorvastatin 10 mg daily   Trig in the past high /with high glucose

## 2020-11-28 NOTE — Progress Notes (Signed)
Subjective:    Patient ID: Roberto Allen, male    DOB: 05/04/1960, 60 y.o.   MRN: [id number]  This visit occurred during the SARS-CoV-2 public health emergency.  Safety protocols were in place, including screening questions prior to the visit, additional usage of staff PPE, and extensive cleaning of exam room while observing appropriate contact time as indicated for disinfecting solutions.   HPI Pt presents for f/u of DM2 and HTN   Wt Readings from Last 3 Encounters:  11/28/20 225 lb (102.1 kg)  08/27/20 227 lb (103 kg)  08/06/20 234 lb (106.1 kg)   31.38 kg/m  Feeling ok except for cramps in legs  He takes magnesium Works a lot   First day off in 24 days  Very short staffed    HTN bp is stable today  No cp or palpitations or headaches or edema  No side effects to medicines  BP Readings from Last 3 Encounters:  11/28/20 132/86  08/27/20 (!) 142/84  08/06/20 (!) 162/98    CHF  Doing better since he went to the hospital  Back on furosemide   Pulse Readings from Last 3 Encounters:  11/28/20 79  08/27/20 77  08/06/20 74      Losartan 50 mg daily Metoprolol xl 100 mg daily Isosorbide 120 mg daily Hydralazine 25 mg tid Amlodipine 5 mg daily Amiloride 5 mg daily  Lasix 20 mg daily   Lab Results  Component Value Date   CREATININE 2.37 (H) 09/18/2020   BUN 23 09/18/2020   NA 145 (H) 09/18/2020   K 3.8 09/18/2020   CL 105 09/18/2020   CO2 24 09/18/2020   CKD stage 3 b Sees nephrology    Lab Results  Component Value Date   HGBA1C 13.7 (H) 03/20/2020   Blood sugar goes up and down  Will do really well for a while and then bad again  Depends on diet   Lab Results  Component Value Date   HGBA1C 14.7 (A) 11/28/2020   He did not expect this to be so high   Has a meter- at work, he forgets to take it home  Yesterday 180s -usually where he is  Occ gets as low as 90   Jardiance 25 mg daily  Ozempic 1 mg weekly  (hard to get because his pharmacy  runs out of it)  He misses doses  Humulin - sliding scale (not more than 15)  Lantus 50 u   Diet is good at home At work it is bad, eats from [employer] -chicken tenders/fries  Soda occ   He used to see Dr Hartford Poli for endo / and insuance issue   On feet all day at work  He cannot add more at this time  Occ joint pain - takes tylenol    Foot care - Dr Jacqualyn Posey, R foot ulceration /much better -goes back in 6 months His wife helps him keep it wrapped and moisturized Eye exam 7/22-- in process of changing glasses No retinopathy   Arb and statin   Hyperlipidemia  Lab Results  Component Value Date   CHOL 197 03/20/2020   HDL 41.10 03/20/2020   LDLCALC UNABLE TO CALCULATE IF TRIGLYCERIDE OVER 400 mg/dL 12/17/2012   LDLDIRECT 74.0 03/20/2020   TRIG (H) 03/20/2020    481.0 Triglyceride is over 400; calculations on Lipids are invalid.   CHOLHDL 5 03/20/2020   Atorvastatin 10 mg daily  Diet if fair   Patient Active Problem List   Diagnosis  Date Noted   CHF (congestive heart failure) ([place]) 04/28/2019   Hypertension secondary to other renal disorders 11/29/2016   GERD (gastroesophageal reflux disease) 07/23/2016   Degenerative disc disease at L5-S1 level 04/09/2016   Primary osteoarthritis of right hip 04/09/2016   Obesity, Class I, BMI 30-34.9 02/06/2016   Ulcer of left foot ([place]) 01/06/2015   Microalbuminuria due to type 2 diabetes mellitus ([place]) 10/03/2014   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 diabetes mellitus ([place]) 06/26/2014   NICM (nonischemic cardiomyopathy) ([place]) 09/06/2013   ED (erectile dysfunction) 03/27/2013   Disorder of kidney and ureter 12/17/2012   DM (diabetes mellitus), type 2 with renal complications ([place]) 75/11/2583   Coronary Artery Disease (non-obstructive by cath 05/2011) 06/14/2011   Hyperlipidemia associated with type 2 diabetes mellitus ([place]) 06/14/2011   OSA (obstructive sleep apnea) 05/25/2011   Hypertension    Past Medical History:  Diagnosis Date   CAD  (coronary artery disease)    a. LHC 4/13: mD1 25%, oD2 25%, pOM1 25%, pRCA 25%, dRCA 40%, EF 30%   Chronic kidney disease (CKD), stage III (moderate) (HCC)    Chronic systolic heart failure (HCC)    a. EF 30-35% in 04/2011, improved to 55-60% in 01/2012.   COPD (chronic obstructive pulmonary disease) (HCC)    Diabetes mellitus    GERD (gastroesophageal reflux disease)    History of pneumonia    Hyperkalemia    a. 12/2012 - spironolactone, KCl supp discontinued.   Hyperlipidemia    Hypertension    Hypertriglyceridemia    a. 12/2012 - started on fenofibrate.   NICM (nonischemic cardiomyopathy) ([place])    a. Echo 3/13: mild LVH, EF 30-35%, grade 3 diast dysfxn, mod LAE;  RHC 4/13:  RA 8, RV 58/10, PA 56/18, mean 35, PCWP mean 17, LV 154/27, CO 4.3, CI 2.0. b. EF Improved >>> Echo (01/2012):  Mild LVH, EF 55-60%, no RWMA, Gr 1 DD   Past Surgical History:  Procedure Laterality Date   CARDIAC CATHETERIZATION  2013   non-obs dz 25-40% multiple vessels   TENOTOMY  2018   Social History   Tobacco Use   Smoking status: Former    Packs/day: 1.00    Years: 20.00    Pack years: 20.00    Types: Cigarettes    Quit date: 09/15/2006    Years since quitting: 14.2   Smokeless tobacco: Never  Vaping Use   Vaping Use: Never used  Substance Use Topics   Alcohol use: Not Currently    Comment: occ   Drug use: No   Family History  Problem Relation Age of Onset   Diabetes Sister    Breast cancer Mother    Breast cancer Maternal Grandmother    Allergies  Allergen Reactions   Metformin And Related Other (See Comments)    Due to CHF and CRI   Current Outpatient Medications on File Prior to Visit  Medication Sig Dispense Refill   ACCU-CHEK FASTCLIX LANCETS MISC USE TO MONITOR BLOOD GLUCOSE 3 TIME(S) DAILY  5   aMILoride (MIDAMOR) 5 MG tablet Take 5 mg by mouth daily.     amLODipine (NORVASC) 5 MG tablet Take 1 tablet (5 mg total) by mouth daily. 30 tablet 0   aspirin EC 81 MG EC tablet Take 1  tablet (81 mg total) by mouth daily.     atorvastatin (LIPITOR) 10 MG tablet Take 1 tablet (10 mg total) by mouth daily. 90 tablet 1   BD PEN NEEDLE NANO U/F  32G X 4 MM MISC Inject 1 Units into the skin as needed. 100 each 3   doxycycline (VIBRA-TABS) 100 MG tablet Take 1 tablet (100 mg total) by mouth 2 (two) times daily. 20 tablet 0   glucose blood (ONETOUCH VERIO) test strip 1 each by Other route as needed for other (3-4 times per day). Use as instructed 100 each 11   HUMALOG KWIKPEN 100 UNIT/ML KwikPen SMARTSIG:15 Unit(s) SUB-Q 3 Times Daily PRN     hydrALAZINE (APRESOLINE) 25 MG tablet Take 1 tablet (25 mg total) by mouth 3 (three) times daily. 270 tablet 3   isosorbide mononitrate (IMDUR) 120 MG 24 hr tablet Take 1 tablet (120 mg total) by mouth daily. 30 tablet 0   JARDIANCE 25 MG TABS tablet TAKE 1 TABLET (25 MG TOTAL) BY MOUTH DAILY. 90 tablet 0   LANTUS SOLOSTAR 100 UNIT/ML Solostar Pen Inject 50 Units into the skin at bedtime. 15 mL 5   losartan (COZAAR) 50 MG tablet TAKE 1 TABLET BY MOUTH EVERY DAY 30 tablet 1   magnesium oxide (MAG-OX) 400 (241.3 Mg) MG tablet Take 1 tablet (400 mg total) by mouth daily. 90 tablet 3   metoprolol succinate (TOPROL-XL) 100 MG 24 hr tablet Take 100 mg by mouth daily.      mupirocin ointment (BACTROBAN) 2 % Apply 1 application topically daily. 30 g 2   omeprazole (PRILOSEC) 20 MG capsule TAKE 1 CAPSULE BY MOUTH TWICE A DAY BEFORE A MEAL 180 capsule 0   OZEMPIC, 1 MG/DOSE, 4 MG/3ML SOPN INJECT 1 MG UNDER SKIN ONCE A WEEK 3 mL 0   potassium chloride SA (KLOR-CON) 20 MEQ tablet Take 1 tablet (20 mEq total) by mouth 2 (two) times daily. 10 tablet 0   silver sulfADIAZINE (SILVADENE) 1 % cream Apply 1 application topically daily. 50 g 0   cholecalciferol (VITAMIN D) 25 MCG (1000 UNIT) tablet Take 1,000 Units by mouth daily. (Patient not taking: Reported on 11/28/2020)     Cholecalciferol (VITAMIN D3) 50 MCG (2000 UT) TABS Take 1 tablet by mouth once a week.  (Patient not taking: No sig reported)     ergocalciferol (VITAMIN D2) 1.25 MG (50000 UT) capsule Take by mouth. (Patient not taking: No sig reported)     furosemide (LASIX) 20 MG tablet Take 1 tablet (20 mg total) by mouth daily for 7 days. 7 tablet 0   [DISCONTINUED] potassium chloride (KLOR-CON) 20 MEQ packet Take 20 mEq by mouth 2 (two) times daily.     No current facility-administered medications on file prior to visit.    Review of Systems  Constitutional:  Positive for fatigue. Negative for activity change, appetite change, fever and unexpected weight change.       Long work hours  HENT:  Negative for congestion, rhinorrhea, sore throat and trouble swallowing.   Eyes:  Negative for pain, redness, itching and visual disturbance.  Respiratory:  Negative for cough, chest tightness, shortness of breath and wheezing.   Cardiovascular:  Negative for chest pain and palpitations.  Gastrointestinal:  Negative for abdominal pain, blood in stool, constipation, diarrhea and nausea.  Endocrine: Negative for cold intolerance, heat intolerance, polydipsia and polyuria.  Genitourinary:  Negative for difficulty urinating, dysuria, frequency and urgency.  Musculoskeletal:  Negative for arthralgias, joint swelling and myalgias.  Skin:  Negative for pallor and rash.  Neurological:  Negative for dizziness, tremors, weakness, numbness and headaches.  Hematological:  Negative for adenopathy. Does not bruise/bleed easily.  Psychiatric/Behavioral:  Negative for  decreased concentration and dysphoric mood. The patient is not nervous/anxious.       Objective:   Physical Exam Constitutional:      General: He is not in acute distress.    Appearance: Normal appearance. He is well-developed. He is obese. He is not ill-appearing or diaphoretic.  HENT:     Head: Normocephalic and atraumatic.  Eyes:     Conjunctiva/sclera: Conjunctivae normal.     Pupils: Pupils are equal, round, and reactive to light.  Neck:      Thyroid: No thyromegaly.     Vascular: No carotid bruit or JVD.  Cardiovascular:     Rate and Rhythm: Normal rate and regular rhythm.     Pulses: Normal pulses.     Heart sounds: Normal heart sounds.    No gallop.  Pulmonary:     Effort: Pulmonary effort is normal. No respiratory distress.     Breath sounds: Normal breath sounds. No wheezing or rales.  Abdominal:     General: Bowel sounds are normal. There is no distension or abdominal bruit.     Palpations: Abdomen is soft. There is no mass.     Tenderness: There is no abdominal tenderness.  Musculoskeletal:     Cervical back: Normal range of motion and neck supple.     Right lower leg: No edema.     Left lower leg: No edema.  Lymphadenopathy:     Cervical: No cervical adenopathy.  Skin:    General: Skin is warm and dry.     Coloration: Skin is not pale.     Findings: No rash.  Neurological:     Mental Status: He is alert.     Coordination: Coordination normal.     Deep Tendon Reflexes: Reflexes are normal and symmetric. Reflexes normal.  Psychiatric:        Mood and Affect: Mood normal.          Assessment & Plan:   Problem List Items Addressed This Visit       Cardiovascular and Mediastinum   Hypertension    bp in fair control at this time  BP Readings from Last 1 Encounters:  11/28/20 132/86  No changes needed Most recent labs reviewed  Disc lifstyle change with low sodium diet and exercise  Continues current medicines and care with cardiology Losartan 50 mg daily Metoprolol xl 100 mg daily Isosorbide 120 mg daily Hydralazine 25 mg tid Amlodipine 5 mg daily Amiloride 5 mg daily  Lasix 20 mg daily         Endocrine   Hyperlipidemia associated with type 2 diabetes mellitus ([place])    Lab today Disc goals for lipids and reasons to control them Rev last labs with pt Rev low sat fat diet in detail Takes atorvastatin 10 mg daily   Trig in the past high /with high glucose       Relevant Orders    Comprehensive metabolic panel (Completed)   Lipid panel (Completed)   DM (diabetes mellitus), type 2 with renal complications ([place]) - Primary    Lab Results  Component Value Date   HGBA1C 14.7 (A) 11/28/2020  Pt has difficulty with diet/control during long work hours Also needs new meter Also a lot of trouble getting ozempic so not compliant  Eye care utd On arb and statin  Foot care is good and R foot ulcer healing  jardiance 25 mg daily  Humulin ss lantus 50 u daily  ozempic 1 mg weekly (when he takes)  Will ref to endocrinology (he used to see Dr Hartford Poli with [facility])      Relevant Medications   Blood Glucose Monitoring Suppl (ONETOUCH VERIO REFLECT) w/Device KIT   Other Relevant Orders   POCT glycosylated hemoglobin (Hb A1C) (Completed)   Comprehensive metabolic panel (Completed)   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 diabetes mellitus ([place])    Sees nephology  Is mindful about fluid intake in light of CHF         Other   Ulcer of left foot ([place])   Other Visit Diagnoses     Type 2 diabetes mellitus with diabetic polyneuropathy, without long-term current use of insulin (HCC)       Relevant Medications   Blood Glucose Monitoring Suppl (ONETOUCH VERIO REFLECT) w/Device KIT   Other Relevant Orders   Ambulatory referral to Endocrinology   Need for influenza vaccination       Relevant Orders   Flu Vaccine QUAD 6+ mos PF IM (Fluarix Quad PF) (Completed)

## 2020-11-28 NOTE — Patient Instructions (Addendum)
Consider packing lunch  Stop all sugar drinks (diet is ok)  Please try to stick to a low glycemic/diabetic diet   Flu shot today   Labs today   I placed a referral to endocrinology for your diabetes-you will get a call to set that up   Take care of yourself

## 2020-11-28 NOTE — Assessment & Plan Note (Signed)
bp in fair control at this time  BP Readings from Last 1 Encounters:  11/28/20 132/86   No changes needed Most recent labs reviewed  Disc lifstyle change with low sodium diet and exercise  Continues current medicines and care with cardiology Losartan 50 mg daily Metoprolol xl 100 mg daily Isosorbide 120 mg daily Hydralazine 25 mg tid Amlodipine 5 mg daily Amiloride 5 mg daily  Lasix 20 mg daily

## 2020-11-28 NOTE — Assessment & Plan Note (Signed)
Lab Results  Component Value Date   HGBA1C 14.7 (A) 11/28/2020   Pt has difficulty with diet/control during long work hours Also needs new meter Also a lot of trouble getting ozempic so not compliant  Eye care utd On arb and statin  Foot care is good and R foot ulcer healing  jardiance 25 mg daily  Humulin ss lantus 50 u daily  ozempic 1 mg weekly (when he takes) Will ref to endocrinology (he used to see Dr Hartford Poli with [facility])

## 2020-11-30 NOTE — Assessment & Plan Note (Signed)
Sees nephology  Is mindful about fluid intake in light of CHF

## 2020-12-02 ENCOUNTER — Encounter: Payer: Self-pay | Admitting: Family Medicine

## 2020-12-02 DIAGNOSIS — N1832 Chronic kidney disease, stage 3b: Secondary | ICD-10-CM

## 2020-12-02 DIAGNOSIS — Z794 Long term (current) use of insulin: Secondary | ICD-10-CM

## 2020-12-02 MED ORDER — GLUCOSE BLOOD VI STRP: ORAL_STRIP | 3 refills | Status: DC

## 2020-12-02 NOTE — Telephone Encounter (Signed)
Pt viewed labs and sent this message as a response

## 2020-12-12 ENCOUNTER — Other Ambulatory Visit: Payer: Self-pay | Admitting: Cardiovascular Disease

## 2020-12-15 ENCOUNTER — Other Ambulatory Visit: Payer: Self-pay

## 2020-12-15 ENCOUNTER — Emergency Department: Payer: 59

## 2020-12-15 ENCOUNTER — Encounter: Payer: Self-pay | Admitting: Family Medicine

## 2020-12-15 DIAGNOSIS — G4733 Obstructive sleep apnea (adult) (pediatric): Secondary | ICD-10-CM | POA: Diagnosis present

## 2020-12-15 DIAGNOSIS — E876 Hypokalemia: Secondary | ICD-10-CM | POA: Diagnosis present

## 2020-12-15 DIAGNOSIS — I160 Hypertensive urgency: Secondary | ICD-10-CM | POA: Diagnosis present

## 2020-12-15 DIAGNOSIS — Z7982 Long term (current) use of aspirin: Secondary | ICD-10-CM

## 2020-12-15 DIAGNOSIS — Z794 Long term (current) use of insulin: Secondary | ICD-10-CM

## 2020-12-15 DIAGNOSIS — N1832 Chronic kidney disease, stage 3b: Secondary | ICD-10-CM | POA: Diagnosis present

## 2020-12-15 DIAGNOSIS — Z79899 Other long term (current) drug therapy: Secondary | ICD-10-CM

## 2020-12-15 DIAGNOSIS — T502X5A Adverse effect of carbonic-anhydrase inhibitors, benzothiadiazides and other diuretics, initial encounter: Secondary | ICD-10-CM | POA: Diagnosis present

## 2020-12-15 DIAGNOSIS — I130 Hypertensive heart and chronic kidney disease with heart failure and stage 1 through stage 4 chronic kidney disease, or unspecified chronic kidney disease: Principal | ICD-10-CM | POA: Diagnosis present

## 2020-12-15 DIAGNOSIS — Z7984 Long term (current) use of oral hypoglycemic drugs: Secondary | ICD-10-CM

## 2020-12-15 DIAGNOSIS — N179 Acute kidney failure, unspecified: Secondary | ICD-10-CM | POA: Diagnosis present

## 2020-12-15 DIAGNOSIS — E1122 Type 2 diabetes mellitus with diabetic chronic kidney disease: Secondary | ICD-10-CM | POA: Diagnosis present

## 2020-12-15 DIAGNOSIS — Z833 Family history of diabetes mellitus: Secondary | ICD-10-CM

## 2020-12-15 DIAGNOSIS — K219 Gastro-esophageal reflux disease without esophagitis: Secondary | ICD-10-CM | POA: Diagnosis present

## 2020-12-15 DIAGNOSIS — I440 Atrioventricular block, first degree: Secondary | ICD-10-CM | POA: Diagnosis present

## 2020-12-15 DIAGNOSIS — Z87891 Personal history of nicotine dependence: Secondary | ICD-10-CM

## 2020-12-15 DIAGNOSIS — I2510 Atherosclerotic heart disease of native coronary artery without angina pectoris: Secondary | ICD-10-CM | POA: Diagnosis present

## 2020-12-15 DIAGNOSIS — Z803 Family history of malignant neoplasm of breast: Secondary | ICD-10-CM

## 2020-12-15 DIAGNOSIS — E669 Obesity, unspecified: Secondary | ICD-10-CM | POA: Diagnosis present

## 2020-12-15 DIAGNOSIS — E781 Pure hyperglyceridemia: Secondary | ICD-10-CM | POA: Diagnosis present

## 2020-12-15 DIAGNOSIS — I509 Heart failure, unspecified: Secondary | ICD-10-CM | POA: Diagnosis not present

## 2020-12-15 DIAGNOSIS — J449 Chronic obstructive pulmonary disease, unspecified: Secondary | ICD-10-CM | POA: Diagnosis present

## 2020-12-15 DIAGNOSIS — Z20822 Contact with and (suspected) exposure to covid-19: Secondary | ICD-10-CM | POA: Diagnosis present

## 2020-12-15 DIAGNOSIS — Z6831 Body mass index (BMI) 31.0-31.9, adult: Secondary | ICD-10-CM

## 2020-12-15 DIAGNOSIS — I5043 Acute on chronic combined systolic (congestive) and diastolic (congestive) heart failure: Secondary | ICD-10-CM | POA: Diagnosis present

## 2020-12-15 DIAGNOSIS — I428 Other cardiomyopathies: Secondary | ICD-10-CM | POA: Diagnosis present

## 2020-12-15 DIAGNOSIS — E785 Hyperlipidemia, unspecified: Secondary | ICD-10-CM | POA: Diagnosis present

## 2020-12-15 DIAGNOSIS — E875 Hyperkalemia: Secondary | ICD-10-CM | POA: Diagnosis present

## 2020-12-15 LAB — CBC WITH DIFFERENTIAL/PLATELET
Abs Immature Granulocytes: 0.02 10*3/uL (ref 0.00–0.07)
Basophils Absolute: 0 10*3/uL (ref 0.0–0.1)
Basophils Relative: 0 %
Eosinophils Absolute: 0.2 10*3/uL (ref 0.0–0.5)
Eosinophils Relative: 2 %
HCT: 35.5 % — ABNORMAL LOW (ref 39.0–52.0)
Hemoglobin: 12.3 g/dL — ABNORMAL LOW (ref 13.0–17.0)
Immature Granulocytes: 0 %
Lymphocytes Relative: 24 %
Lymphs Abs: 1.8 10*3/uL (ref 0.7–4.0)
MCH: 31.9 pg (ref 26.0–34.0)
MCHC: 34.6 g/dL (ref 30.0–36.0)
MCV: 92 fL (ref 80.0–100.0)
Monocytes Absolute: 0.6 10*3/uL (ref 0.1–1.0)
Monocytes Relative: 7 %
Neutro Abs: 5 10*3/uL (ref 1.7–7.7)
Neutrophils Relative %: 67 %
Platelets: 313 10*3/uL (ref 150–400)
RBC: 3.86 MIL/uL — ABNORMAL LOW (ref 4.22–5.81)
RDW: 14.2 % (ref 11.5–15.5)
WBC: 7.6 10*3/uL (ref 4.0–10.5)
nRBC: 0 % (ref 0.0–0.2)

## 2020-12-15 LAB — COMPREHENSIVE METABOLIC PANEL
ALT: 14 U/L (ref 0–44)
AST: 18 U/L (ref 15–41)
Albumin: 3.3 g/dL — ABNORMAL LOW (ref 3.5–5.0)
Alkaline Phosphatase: 86 U/L (ref 38–126)
Anion gap: 8 (ref 5–15)
BUN: 23 mg/dL (ref 8–23)
CO2: 28 mmol/L (ref 22–32)
Calcium: 8.6 mg/dL — ABNORMAL LOW (ref 8.9–10.3)
Chloride: 106 mmol/L (ref 98–111)
Creatinine, Ser: 2.54 mg/dL — ABNORMAL HIGH (ref 0.61–1.24)
GFR, Estimated: 28 mL/min — ABNORMAL LOW (ref >60)
Glucose, Bld: 127 mg/dL — ABNORMAL HIGH (ref 70–99)
Potassium: 3.1 mmol/L — ABNORMAL LOW (ref 3.5–5.1)
Sodium: 142 mmol/L (ref 135–145)
Total Bilirubin: 0.9 mg/dL (ref 0.3–1.2)
Total Protein: 7 g/dL (ref 6.5–8.1)

## 2020-12-15 LAB — CBG MONITORING, ED: Glucose-Capillary: 113 mg/dL — ABNORMAL HIGH (ref 70–99)

## 2020-12-15 LAB — RESP PANEL BY RT-PCR (FLU A&B, COVID) ARPGX2
Influenza A by PCR: NEGATIVE
Influenza B by PCR: NEGATIVE
SARS Coronavirus 2 by RT PCR: NEGATIVE

## 2020-12-15 LAB — TROPONIN I (HIGH SENSITIVITY)
Troponin I (High Sensitivity): 37 ng/L — ABNORMAL HIGH (ref <18)
Troponin I (High Sensitivity): 38 ng/L — ABNORMAL HIGH (ref <18)

## 2020-12-15 LAB — BRAIN NATRIURETIC PEPTIDE: B Natriuretic Peptide: 501.5 pg/mL — ABNORMAL HIGH (ref 0.0–100.0)

## 2020-12-15 NOTE — ED Triage Notes (Signed)
Pt to ED for [facility] x1 week at rest and with exertion. Denies cp. Hx CHF. Reports BLE swelling for days.  Was told to increase diuretics by PCP  Spo2 88-91%. Placed on 2 L [place]

## 2020-12-15 NOTE — Telephone Encounter (Signed)
In ER now Aware, will watch for correspondence  

## 2020-12-15 NOTE — Telephone Encounter (Signed)
Routing to Triage nurses

## 2020-12-15 NOTE — Telephone Encounter (Signed)
Spoke to patient by telephone and was advised that he has been having difficulty breathing. Patient stated that his symptoms started Wednesday. Patient stated that he was around someone that tested positive for covid Monday or Tuesday of last week. Patient stated that he has had a headache, cough, chest congestion and SOB. Patient stated that he just did a home covid test and is waiting on the results. Patient was advised with his symptoms he should go to the ER to be evaluated. Patient stated that he will head to the ER shortly. Patient was advised if his symptoms get worse before going to the ER to call 911 and he verbalized understanding.

## 2020-12-15 NOTE — ED Provider Notes (Signed)
Emergency Medicine Provider Triage Evaluation Note  Roberto Allen , a 60 y.o. male  was evaluated in triage.  Pt complains of increasing shortness of breath, cough, lower extremity edema.  Patient states that he does have some chest tightness but no frank chest pain.  Patient has been around one of his coworkers who has tested positive for COVID.  He took an at-home test today which was negative.  Patient is on Lasix for congestive heart failure, states that he doubled his Lasix from 20-40 and has not noticed a difference in his breathing or his edema  Review of Systems  Positive: Shortness of breath, chest tightness, increased peripheral edema Negative: Fevers, nasal congestion, sore throat, abdominal pain, nausea vomiting or diarrhea  Physical Exam  BP (!) 144/80   Pulse 72   Temp 98.6 F (37 C) (Oral)   Resp (!) 22   Ht 5\' 11"  (1.803 m)   Wt 107 kg   SpO2 90%   BMI 32.92 kg/m  Gen:   Awake, no distress   Resp:  Normal effort.  Patient is transiently hypoxic in triage. MSK:   Moves extremities without difficulty.  Bilateral lower extremity edema, no tenderness.  No erythema Other:    Medical Decision Making  Medically screening exam initiated at 11:45 AM.  Appropriate orders placed.  Jaxxen Voong was informed that the remainder of the evaluation will be completed by another provider, this initial triage assessment does not replace that evaluation, and the importance of remaining in the ED until their evaluation is complete.  Patient arrives with chest tightness, increased shortness of breath, lower extremity edema.  Patient does have a history of CHF.  He has a COVID contact.  Patient will have EKG, labs, chest x-ray, supplemental oxygen at this time   Brynda Peon 12/15/20 1145    Duffy Bruce, MD 12/16/20 [id number]

## 2020-12-16 ENCOUNTER — Inpatient Hospital Stay (HOSPITAL_COMMUNITY)
Admit: 2020-12-16 | Discharge: 2020-12-16 | Disposition: A | Discharge status: Home / Self Care | Payer: 59 | Attending: Internal Medicine | Admitting: Internal Medicine

## 2020-12-16 ENCOUNTER — Inpatient Hospital Stay
Admission: EM | Admit: 2020-12-16 | Discharge: 2020-12-19 | DRG: 291 | Disposition: A | Discharge status: Home / Self Care | Payer: 59 | Attending: Internal Medicine | Admitting: Internal Medicine

## 2020-12-16 DIAGNOSIS — E1122 Type 2 diabetes mellitus with diabetic chronic kidney disease: Secondary | ICD-10-CM

## 2020-12-16 DIAGNOSIS — E875 Hyperkalemia: Secondary | ICD-10-CM | POA: Diagnosis present

## 2020-12-16 DIAGNOSIS — I5031 Acute diastolic (congestive) heart failure: Secondary | ICD-10-CM

## 2020-12-16 DIAGNOSIS — Z6831 Body mass index (BMI) 31.0-31.9, adult: Secondary | ICD-10-CM | POA: Diagnosis not present

## 2020-12-16 DIAGNOSIS — Z7984 Long term (current) use of oral hypoglycemic drugs: Secondary | ICD-10-CM | POA: Diagnosis not present

## 2020-12-16 DIAGNOSIS — I509 Heart failure, unspecified: Secondary | ICD-10-CM | POA: Insufficient documentation

## 2020-12-16 DIAGNOSIS — E876 Hypokalemia: Secondary | ICD-10-CM | POA: Diagnosis present

## 2020-12-16 DIAGNOSIS — Z833 Family history of diabetes mellitus: Secondary | ICD-10-CM | POA: Diagnosis not present

## 2020-12-16 DIAGNOSIS — N179 Acute kidney failure, unspecified: Secondary | ICD-10-CM | POA: Diagnosis present

## 2020-12-16 DIAGNOSIS — Z20822 Contact with and (suspected) exposure to covid-19: Secondary | ICD-10-CM | POA: Diagnosis present

## 2020-12-16 DIAGNOSIS — E785 Hyperlipidemia, unspecified: Secondary | ICD-10-CM | POA: Diagnosis present

## 2020-12-16 DIAGNOSIS — E781 Pure hyperglyceridemia: Secondary | ICD-10-CM | POA: Diagnosis present

## 2020-12-16 DIAGNOSIS — E1165 Type 2 diabetes mellitus with hyperglycemia: Secondary | ICD-10-CM | POA: Diagnosis not present

## 2020-12-16 DIAGNOSIS — I420 Dilated cardiomyopathy: Secondary | ICD-10-CM

## 2020-12-16 DIAGNOSIS — N1832 Chronic kidney disease, stage 3b: Secondary | ICD-10-CM | POA: Diagnosis present

## 2020-12-16 DIAGNOSIS — N183 Chronic kidney disease, stage 3 unspecified: Secondary | ICD-10-CM | POA: Diagnosis present

## 2020-12-16 DIAGNOSIS — I160 Hypertensive urgency: Secondary | ICD-10-CM | POA: Diagnosis present

## 2020-12-16 DIAGNOSIS — I5043 Acute on chronic combined systolic (congestive) and diastolic (congestive) heart failure: Secondary | ICD-10-CM | POA: Diagnosis present

## 2020-12-16 DIAGNOSIS — I5033 Acute on chronic diastolic (congestive) heart failure: Secondary | ICD-10-CM | POA: Diagnosis not present

## 2020-12-16 DIAGNOSIS — I428 Other cardiomyopathies: Secondary | ICD-10-CM | POA: Diagnosis present

## 2020-12-16 DIAGNOSIS — E669 Obesity, unspecified: Secondary | ICD-10-CM | POA: Diagnosis present

## 2020-12-16 DIAGNOSIS — T502X5A Adverse effect of carbonic-anhydrase inhibitors, benzothiadiazides and other diuretics, initial encounter: Secondary | ICD-10-CM | POA: Diagnosis present

## 2020-12-16 DIAGNOSIS — K219 Gastro-esophageal reflux disease without esophagitis: Secondary | ICD-10-CM | POA: Diagnosis present

## 2020-12-16 DIAGNOSIS — I10 Essential (primary) hypertension: Secondary | ICD-10-CM | POA: Diagnosis not present

## 2020-12-16 DIAGNOSIS — Z803 Family history of malignant neoplasm of breast: Secondary | ICD-10-CM | POA: Diagnosis not present

## 2020-12-16 DIAGNOSIS — J449 Chronic obstructive pulmonary disease, unspecified: Secondary | ICD-10-CM | POA: Diagnosis present

## 2020-12-16 DIAGNOSIS — I130 Hypertensive heart and chronic kidney disease with heart failure and stage 1 through stage 4 chronic kidney disease, or unspecified chronic kidney disease: Secondary | ICD-10-CM | POA: Diagnosis present

## 2020-12-16 DIAGNOSIS — I440 Atrioventricular block, first degree: Secondary | ICD-10-CM | POA: Diagnosis present

## 2020-12-16 DIAGNOSIS — G4733 Obstructive sleep apnea (adult) (pediatric): Secondary | ICD-10-CM | POA: Diagnosis present

## 2020-12-16 DIAGNOSIS — Z87891 Personal history of nicotine dependence: Secondary | ICD-10-CM | POA: Diagnosis not present

## 2020-12-16 DIAGNOSIS — I2510 Atherosclerotic heart disease of native coronary artery without angina pectoris: Secondary | ICD-10-CM

## 2020-12-16 LAB — ECHOCARDIOGRAM COMPLETE
AR max vel: 2.16 cm2
AV Area VTI: 2.51 cm2
AV Area mean vel: 2.25 cm2
AV Mean grad: 2 mmHg
AV Peak grad: 3.2 mmHg
Ao pk vel: 0.89 m/s
Area-P 1/2: 5.23 cm2
Calc EF: 46.9 %
S' Lateral: 3.66 cm
Single Plane A2C EF: 42.5 %
Single Plane A4C EF: 46.5 %

## 2020-12-16 LAB — CBC
HCT: 36.9 % — ABNORMAL LOW (ref 39.0–52.0)
Hemoglobin: 12.7 g/dL — ABNORMAL LOW (ref 13.0–17.0)
MCH: 31.9 pg (ref 26.0–34.0)
MCHC: 34.4 g/dL (ref 30.0–36.0)
MCV: 92.7 fL (ref 80.0–100.0)
Platelets: 317 10*3/uL (ref 150–400)
RBC: 3.98 MIL/uL — ABNORMAL LOW (ref 4.22–5.81)
RDW: 13.9 % (ref 11.5–15.5)
WBC: 8.7 10*3/uL (ref 4.0–10.5)
nRBC: 0 % (ref 0.0–0.2)

## 2020-12-16 LAB — CBG MONITORING, ED
Glucose-Capillary: 101 mg/dL — ABNORMAL HIGH (ref 70–99)
Glucose-Capillary: 132 mg/dL — ABNORMAL HIGH (ref 70–99)
Glucose-Capillary: 123 mg/dL — ABNORMAL HIGH (ref 70–99)
Glucose-Capillary: 161 mg/dL — ABNORMAL HIGH (ref 70–99)

## 2020-12-16 LAB — HIV ANTIBODY (ROUTINE TESTING W REFLEX): HIV Screen 4th Generation wRfx: NONREACTIVE

## 2020-12-16 LAB — CREATININE, SERUM
Creatinine, Ser: 2.24 mg/dL — ABNORMAL HIGH (ref 0.61–1.24)
GFR, Estimated: 32 mL/min — ABNORMAL LOW (ref >60)

## 2020-12-16 MED ORDER — ISOSORBIDE MONONITRATE ER 60 MG PO TB24
120.0000 mg | ORAL_TABLET | Freq: Every day | ORAL | Status: DC
  Administered 2020-12-16 – 2020-12-19 (×4): 120 mg via ORAL
  Filled 2020-12-16 (×4): qty 2

## 2020-12-16 MED ORDER — ATORVASTATIN CALCIUM 20 MG PO TABS
10.0000 mg | ORAL_TABLET | Freq: Every day | ORAL | Status: DC
  Administered 2020-12-16: 10 mg via ORAL
  Filled 2020-12-16: qty 1

## 2020-12-16 MED ORDER — PANTOPRAZOLE SODIUM 40 MG PO TBEC
40.0000 mg | DELAYED_RELEASE_TABLET | Freq: Every day | ORAL | Status: DC
  Administered 2020-12-16 – 2020-12-19 (×4): 40 mg via ORAL
  Filled 2020-12-16 (×4): qty 1

## 2020-12-16 MED ORDER — ASPIRIN EC 81 MG PO TBEC
81.0000 mg | DELAYED_RELEASE_TABLET | Freq: Every day | ORAL | Status: DC
  Administered 2020-12-16 – 2020-12-19 (×3): 81 mg via ORAL
  Filled 2020-12-16 (×4): qty 1

## 2020-12-16 MED ORDER — ONDANSETRON HCL 4 MG/2ML IJ SOLN: 4.0000 mg | Freq: Four times a day (QID) | INTRAMUSCULAR | Status: DC | PRN

## 2020-12-16 MED ORDER — ENOXAPARIN SODIUM 60 MG/0.6ML IJ SOSY
0.5000 mg/kg | PREFILLED_SYRINGE | INTRAMUSCULAR | Status: DC
  Administered 2020-12-16 – 2020-12-19 (×3): 52.5 mg via SUBCUTANEOUS
  Filled 2020-12-16: qty 0.6
  Filled 2020-12-16 (×2): qty 0.53
  Filled 2020-12-16: qty 0.6

## 2020-12-16 MED ORDER — FUROSEMIDE 10 MG/ML IJ SOLN
40.0000 mg | Freq: Once | INTRAMUSCULAR | Status: AC
  Administered 2020-12-16: 40 mg via INTRAVENOUS
  Filled 2020-12-16: qty 4

## 2020-12-16 MED ORDER — SODIUM CHLORIDE 0.9% FLUSH
3.0000 mL | Freq: Two times a day (BID) | INTRAVENOUS | Status: DC
  Administered 2020-12-16 – 2020-12-19 (×6): 3 mL via INTRAVENOUS

## 2020-12-16 MED ORDER — METOPROLOL SUCCINATE ER 50 MG PO TB24
100.0000 mg | ORAL_TABLET | Freq: Every day | ORAL | Status: DC
  Administered 2020-12-16: 100 mg via ORAL
  Filled 2020-12-16 (×2): qty 2

## 2020-12-16 MED ORDER — SODIUM CHLORIDE 0.9% FLUSH
3.0000 mL | INTRAVENOUS | Status: DC | PRN
  Administered 2020-12-17: 3 mL via INTRAVENOUS

## 2020-12-16 MED ORDER — FUROSEMIDE 10 MG/ML IJ SOLN: 40.0000 mg | Freq: Every day | INTRAMUSCULAR | Status: DC

## 2020-12-16 MED ORDER — ACETAMINOPHEN 325 MG PO TABS
650.0000 mg | ORAL_TABLET | ORAL | Status: DC | PRN
  Administered 2020-12-17 – 2020-12-19 (×4): 650 mg via ORAL
  Filled 2020-12-16 (×4): qty 2

## 2020-12-16 MED ORDER — SODIUM CHLORIDE 0.9 % IV SOLN: 250.0000 mL | INTRAVENOUS | Status: DC | PRN

## 2020-12-16 MED ORDER — POTASSIUM CHLORIDE CRYS ER 20 MEQ PO TBCR
40.0000 meq | EXTENDED_RELEASE_TABLET | Freq: Every day | ORAL | Status: DC
  Administered 2020-12-16 – 2020-12-19 (×4): 40 meq via ORAL
  Filled 2020-12-16: qty 2
  Filled 2020-12-16 (×2): qty 4
  Filled 2020-12-16: qty 2

## 2020-12-16 MED ORDER — LOSARTAN POTASSIUM 50 MG PO TABS
50.0000 mg | ORAL_TABLET | Freq: Every day | ORAL | Status: DC
  Administered 2020-12-16: 50 mg via ORAL
  Filled 2020-12-16: qty 1

## 2020-12-16 MED ORDER — ENOXAPARIN SODIUM 40 MG/0.4ML IJ SOSY: 40.0000 mg | PREFILLED_SYRINGE | INTRAMUSCULAR | Status: DC

## 2020-12-16 MED ORDER — FUROSEMIDE 10 MG/ML IJ SOLN
40.0000 mg | Freq: Two times a day (BID) | INTRAMUSCULAR | Status: DC
  Administered 2020-12-16: 40 mg via INTRAVENOUS
  Filled 2020-12-16: qty 4

## 2020-12-16 MED ORDER — ATORVASTATIN CALCIUM 20 MG PO TABS
20.0000 mg | ORAL_TABLET | Freq: Every day | ORAL | Status: DC
  Administered 2020-12-17 – 2020-12-19 (×3): 20 mg via ORAL
  Filled 2020-12-16 (×2): qty 1
  Filled 2020-12-16: qty 2
  Filled 2020-12-16: qty 1

## 2020-12-16 MED ORDER — INSULIN ASPART 100 UNIT/ML IJ SOLN
0.0000 [IU] | Freq: Three times a day (TID) | INTRAMUSCULAR | Status: DC
  Administered 2020-12-16: 3 [IU] via SUBCUTANEOUS
  Administered 2020-12-16: 2 [IU] via SUBCUTANEOUS
  Administered 2020-12-17: 5 [IU] via SUBCUTANEOUS
  Administered 2020-12-17 (×2): 2 [IU] via SUBCUTANEOUS
  Administered 2020-12-18 (×2): 5 [IU] via SUBCUTANEOUS
  Administered 2020-12-19: 8 [IU] via SUBCUTANEOUS
  Administered 2020-12-19: 3 [IU] via SUBCUTANEOUS
  Filled 2020-12-16 (×9): qty 1

## 2020-12-16 NOTE — Progress Notes (Signed)
PHARMACIST - PHYSICIAN COMMUNICATION  CONCERNING:  Enoxaparin (Lovenox) for DVT Prophylaxis    RECOMMENDATION: Patient was prescribed enoxaprin 40mg  q24 hours for VTE prophylaxis.   Filed Weights   12/15/20 1137  Weight: 107 kg (236 lb)    Body mass index is 32.92 kg/m.  Estimated Creatinine Clearance: 37.5 mL/min (A) (by C-G formula based on SCr of 2.54 mg/dL (H)).   Based on [place] patient is candidate for enoxaparin 0.5mg /kg TBW SQ every 24 hours based on BMI being >30.   DESCRIPTION: Pharmacy has adjusted enoxaparin dose per [facility] policy.  Patient is now receiving enoxaparin 52.5 mg every 24 hours    Sherilyn Banker, PharmD Clinical Pharmacist  12/16/2020 9:00 AM

## 2020-12-16 NOTE — Consult Note (Signed)
Cardiology Consultation:   Patient ID: Roberto Allen MRN: [id number]; DOB: [birth year]  Admit date: 12/16/2020 Date of Consult: 12/16/2020  PCP:  Abner Greenspan, MD   [facility] Providers Cardiologist:  Sanda Klein, MD    Patient Profile:   Roberto Allen is a 60 y.o. male with a hx of chronic HFrEF with recovered ejection fraction, nonobstructive coronary artery disease by catheterization in 2013, chronic kidney disease stage IIIb, hypertension, hyperlipidemia, diabetes mellitus, GERD, COPD, and obstructive sleep apnea who is being seen 12/16/2020 for the evaluation of acute on chronic heart failure at the request of Dr. Francine Graven.  History of Present Illness:   Roberto Allen presents to the emergency department with a 2-week history of worsening shortness of breath and leg swelling.  This has been worsening gradually and has progressed to the point of shortness of breath even at rest.  He reports gaining about 20 pounds during this time as well as progressive orthopnea (he now sleeps on 3 pillows) and some PND.  He reports being compliant with his medications and even doubled his usual Lasix dose one day without significant improvement.  He denies chest pain, palpitations, and lightheadedness.  He reports eating out quite a bit and also not limiting his fluid intake.  In the emergency department, Roberto Allen was noted to be quite hypertensive with blood pressure readings of up to 213/119.  He was started on furosemide 40 mg IV twice daily.  He has noticed some improvement in his leg swelling and shortness of breath, though he is not back to baseline.   Past Medical History:  Diagnosis Date   CAD (coronary artery disease)    a. LHC 4/13: mD1 25%, oD2 25%, pOM1 25%, pRCA 25%, dRCA 40%, EF 30%   Chronic kidney disease (CKD), stage III (moderate) (HCC)    Chronic systolic heart failure (HCC)    a. EF 30-35% in 04/2011, improved to 55-60% in 01/2012.   COPD (chronic obstructive pulmonary  disease) (HCC)    Diabetes mellitus    GERD (gastroesophageal reflux disease)    History of pneumonia    Hyperkalemia    a. 12/2012 - spironolactone, KCl supp discontinued.   Hyperlipidemia    Hypertension    Hypertriglyceridemia    a. 12/2012 - started on fenofibrate.   NICM (nonischemic cardiomyopathy) ([place])    a. Echo 3/13: mild LVH, EF 30-35%, grade 3 diast dysfxn, mod LAE;  RHC 4/13:  RA 8, RV 58/10, PA 56/18, mean 35, PCWP mean 17, LV 154/27, CO 4.3, CI 2.0. b. EF Improved >>> Echo (01/2012):  Mild LVH, EF 55-60%, no RWMA, Gr 1 DD    Past Surgical History:  Procedure Laterality Date   CARDIAC CATHETERIZATION  2013   non-obs dz 25-40% multiple vessels   TENOTOMY  2018     Home Medications:  Prior to Admission medications   Medication Sig Start Date Ayshia Gramlich Date Taking? Authorizing Provider  amLODipine (NORVASC) 5 MG tablet Take 1 tablet (5 mg total) by mouth daily. 05/01/19  Yes Geradine Girt, DO  aspirin EC 81 MG EC tablet Take 1 tablet (81 mg total) by mouth daily. 05/02/19  Yes Vann, Jessica U, DO  atorvastatin (LIPITOR) 10 MG tablet Take 1 tablet (10 mg total) by mouth daily. 10/28/20  Yes Tower, Wynelle Fanny, MD  furosemide (LASIX) 20 MG tablet Take 1 tablet (20 mg total) by mouth daily for 7 days. 08/12/20 12/16/20 Yes Croitoru, Mihai, MD  hydrALAZINE (APRESOLINE) 25 MG tablet Take  1 tablet (25 mg total) by mouth 3 (three) times daily. 04/01/20  Yes Tower, Wynelle Fanny, MD  isosorbide mononitrate (IMDUR) 120 MG 24 hr tablet Take 1 tablet (120 mg total) by mouth daily. 05/02/19  Yes Vann, Jessica U, DO  JARDIANCE 25 MG TABS tablet TAKE 1 TABLET (25 MG TOTAL) BY MOUTH DAILY. 11/17/20  Yes Tower, Wynelle Fanny, MD  losartan (COZAAR) 50 MG tablet TAKE 1 TABLET BY MOUTH EVERY DAY Patient taking differently: Take 50 mg by mouth daily. 12/12/20  Yes Croitoru, Mihai, MD  magnesium oxide (MAG-OX) 400 (241.3 Mg) MG tablet Take 1 tablet (400 mg total) by mouth daily. 04/01/20  Yes Tower, Wynelle Fanny, MD   metoprolol succinate (TOPROL-XL) 100 MG 24 hr tablet Take 100 mg by mouth daily.  06/22/16  Yes [provider]  omeprazole (PRILOSEC) 20 MG capsule TAKE 1 CAPSULE BY MOUTH TWICE A DAY BEFORE A MEAL 11/17/20  Yes Tower, Marne A, MD  potassium chloride SA (KLOR-CON) 20 MEQ tablet Take 1 tablet (20 mEq total) by mouth 2 (two) times daily. 02/03/20  Yes Davonna Belling, MD  ACCU-CHEK FASTCLIX LANCETS MISC USE TO MONITOR BLOOD GLUCOSE 3 TIME(S) DAILY 05/05/17   [provider]  aMILoride (MIDAMOR) 5 MG tablet Take 5 mg by mouth daily. Patient not taking: No sig reported 06/06/19   [provider]  BD PEN NEEDLE NANO U/F 32G X 4 MM MISC Inject 1 Units into the skin as needed. 11/05/19   Elby Beck, FNP  Blood Glucose Monitoring Suppl (ONETOUCH VERIO REFLECT) w/Device KIT 1 Units by Does not apply route as needed. 11/28/20   Tower, Wynelle Fanny, MD  cholecalciferol (VITAMIN D) 25 MCG (1000 UNIT) tablet Take 1,000 Units by mouth daily. Patient not taking: No sig reported 05/25/19   [provider]  Cholecalciferol (VITAMIN D3) 50 MCG (2000 UT) TABS Take 1 tablet by mouth once a week. Patient not taking: No sig reported 07/25/19   [provider]  doxycycline (VIBRA-TABS) 100 MG tablet Take 1 tablet (100 mg total) by mouth 2 (two) times daily. Patient not taking: No sig reported 09/11/20   [name], DPM  ergocalciferol (VITAMIN D2) 1.25 MG (50000 UT) capsule Take by mouth. Patient not taking: No sig reported    [provider]  glucose blood (ONETOUCH VERIO) test strip 1 each by Other route as needed for other (3-4 times per day). Use as instructed 02/01/20   Elby Beck, FNP  glucose blood test strip To check glucose tid and prn for poorly controlled diabetes 12/02/20   Tower, Wynelle Fanny, MD  HUMALOG KWIKPEN 100 UNIT/ML KwikPen SMARTSIG:15 Unit(s) SUB-Q 3 Times Daily PRN 06/19/19   [provider]  LANTUS SOLOSTAR 100 UNIT/ML Solostar  Pen Inject 50 Units into the skin at bedtime. 06/16/20   Tower, Wynelle Fanny, MD  mupirocin ointment (BACTROBAN) 2 % Apply 1 application topically daily. 11/26/19   McDonald, Adam R, DPM  OZEMPIC, 1 MG/DOSE, 4 MG/3ML SOPN INJECT 1 MG UNDER SKIN ONCE A WEEK 11/26/20   Tower, Wynelle Fanny, MD  silver sulfADIAZINE (SILVADENE) 1 % cream Apply 1 application topically daily. Patient not taking: No sig reported 12/13/19   [name], DPM  tadalafil (CIALIS) 20 MG tablet TAKE ONE TABLET BY MOUTH DAILY AS NEEDED FOR ERECTILE DYSFUNCTION 11/28/20   Tower, Wynelle Fanny, MD  potassium chloride (KLOR-CON) 20 MEQ packet Take 20 mEq by mouth 2 (two) times daily.  04/23/11  [provider]    Inpatient Medications: Scheduled Meds:  aspirin EC  81 mg Oral Daily   atorvastatin  10 mg Oral Daily   enoxaparin (LOVENOX) injection  0.5 mg/kg Subcutaneous Q24H   furosemide  40 mg Intravenous BID   insulin aspart  0-15 Units Subcutaneous TID WC   isosorbide mononitrate  120 mg Oral Daily   losartan  50 mg Oral Daily   metoprolol succinate  100 mg Oral Daily   pantoprazole  40 mg Oral Daily   potassium chloride  40 mEq Oral Daily   sodium chloride flush  3 mL Intravenous Q12H   Continuous Infusions:  sodium chloride     PRN Meds: sodium chloride, acetaminophen, ondansetron (ZOFRAN) IV, sodium chloride flush  Allergies:    Allergies  Allergen Reactions   Metformin And Related Other (See Comments)    Due to CHF and CRI    Social History:   Social History   Tobacco Use   Smoking status: Former    Packs/day: 1.00    Years: 20.00    Pack years: 20.00    Types: Cigarettes    Quit date: 09/15/2006    Years since quitting: 14.2   Smokeless tobacco: Never  Vaping Use   Vaping Use: Never used  Substance Use Topics   Alcohol use: Not Currently    Comment: occ   Drug use: No     Family History:   Family History  Problem Relation Age of Onset   Diabetes Sister    Breast cancer Mother    Breast  cancer Maternal Grandmother      ROS:  Please see the history of present illness. All other ROS reviewed and negative.     Physical Exam/Data:   Vitals:   12/16/20 0930 12/16/20 1000 12/16/20 1030 12/16/20 1100  BP: (!) 160/92 132/81 140/72 135/72  Pulse: 75 82 74 77  Resp:  _0 Temp:      TempSrc:      SpO2: 96% 96% 94% 97%  Weight:      Height:        Intake/Output Summary (Last 24 hours) at 12/16/2020 1217 Last data filed at 12/16/2020 1123 Gross per 24 hour  Intake --  Output 2295 ml  Net -2295 ml   Last 3 Weights 12/15/2020 11/28/2020 08/27/2020  Weight (lbs) 236 lb 225 lb 227 lb  Weight (kg) 107.049 kg 102.059 kg 102.967 kg     Body mass index is 32.92 kg/m.  General:  Well nourished, well developed, in no acute distress wife is at the bedside. HEENT: normal Neck: no JVD Vascular: No carotid bruits; Distal pulses 2+ bilaterally Cardiac: Regular rate and rhythm with 1/6 systolic murmur.  No rubs or gallops. Lungs: Fair air movement with mildly diminished breath sounds at both lung bases.  No wheezes or crackles. Abd: soft, nontender, no hepatomegaly  Ext: Trace pretibial edema bilaterally. Musculoskeletal:  No deformities, BUE and BLE strength normal and equal Skin: warm and dry  Neuro:  CNs 2-12 intact, no focal abnormalities noted Psych:  Normal affect   EKG:  The EKG was personally reviewed and demonstrates: Normal sinus rhythm with first-degree AV block (PR interval 252 ms) and mild QT prolongation (QTc 482 ms).  Otherwise, no significant abnormality. Telemetry:  Telemetry was personally reviewed and demonstrates: Normal sinus rhythm with PVCs.  Relevant CV Studies: R/LHC (05/28/2011): LMCA normal.  LAD with mild luminal irregularities and 25% stenoses involving D1 and D2.  Moderate  ramus intermedius without disease noted.  LCx with mild luminal irregularities and 25% proximal stenosis involving OM1.  Dominant RCA with 25% proximal and 40% distal lesions.   LVEF 30%.  RA 8, RV 58/10, PA 56/18 (35), PCWP 17, LVEDP 27.  Fick CO/CI 4.3/2.0.  Echocardiogram (04/28/2019): Normal LV size with mild LVH.  LVEF 50-55% with inferior hypokinesis.  Normal RV size and function.  Normal biatrial size.  No significant valvular abnormality.  Normal CVP.  Laboratory Data:  High Sensitivity Troponin:   Recent Labs  Lab 12/15/20 1139 12/15/20 1635  TROPONINIHS 37* 38*     Chemistry Recent Labs  Lab 12/15/20 1139  NA 142  K 3.1*  CL 106  CO2 28  GLUCOSE 127*  BUN 23  CREATININE 2.54*  CALCIUM 8.6*  GFRNONAA 28*  ANIONGAP 8    Recent Labs  Lab 12/15/20 1139  PROT 7.0  ALBUMIN 3.3*  AST 18  ALT 14  ALKPHOS 86  BILITOT 0.9   Lipids No results for input(s): CHOL, TRIG, HDL, LABVLDL, LDLCALC, CHOLHDL in the last 168 hours.  Hematology Recent Labs  Lab 12/15/20 1139  WBC 7.6  RBC 3.86*  HGB 12.3*  HCT 35.5*  MCV 92.0  MCH 31.9  MCHC 34.6  RDW 14.2  PLT 313   Thyroid No results for input(s): TSH, FREET4 in the last 168 hours.  BNP Recent Labs  Lab 12/15/20 1139  BNP 501.5*    DDimer No results for input(s): DDIMER in the last 168 hours.   Radiology/Studies:  DG Chest 2 View  Result Date: 12/15/2020 CLINICAL DATA:  Shortness of breath. EXAM: CHEST - 2 VIEW COMPARISON:  Two-view chest x-ray 07/29/2020 FINDINGS: The heart size is normal. Mild pulmonary vascular congestion is now present. Increased bilateral effusions and bibasilar airspace disease is noted. IMPRESSION: Increased bilateral effusions and bibasilar airspace disease. While this may represent atelectasis, infection is not excluded. Electronically Signed   By: San Morelle M.D.   On: 12/15/2020 12:10     Assessment and Plan:   Acute on chronic heart failure: Patient presents with 2 weeks of worsening dyspnea, edema, and weight gain consistent with acute on chronic heart failure.  Most recent echocardiogram in 2021 showed low normal LVEF with inferior wall  motion abnormality.  Repeat echo is pending. -Continue furosemide 40 mg IV twice daily with close monitoring of renal function. -Continue losartan, metoprolol succinate, and isosorbide mononitrate.  We will need to clarify with Drs. Tower and Croitoru whether concurrent use of isosorbide mononitrate and tadalafil can be continued after discharge. -If decreased LVEF noted, will need to consider ischemia evaluation. -Work on blood pressure control, as well as fluid and sodium restriction.  Coronary artery disease: Catheterization in 2013 showed nonobstructive disease.  Prior echo noted inferior wall motion abnormality concerning for ischemia/scar in this territory.  Catheterization was deferred in light of the patient's CKD. -Follow-up echocardiogram.  If worsening cardiomyopathy identified, ischemia evaluation will need to be readdressed. -Continue aspirin and atorvastatin.  May need to escalate atorvastatin given recent lipid panel 2 weeks ago showed LDL 120 and triglycerides 507.  Chronic kidney disease: Renal function near baseline with slight interval worsening noted with diuresis. -Continue diuresis, as above. -Avoid nephrotoxic agents.  Hypertension: Blood pressure control improving, initially quite high on admission. -Continue diuresis and home medications. -Consider renal artery Doppler.  Risk Assessment/Risk Scores:   New York Heart Association (NYHA) Functional Class NYHA Class IV   For questions or updates, please contact  [facility] Please consult [email or web address] for contact info under [facility].   Signed, Nelva Bush, MD  12/16/2020 12:17 PM

## 2020-12-16 NOTE — Consult Note (Signed)
   Heart Failure Nurse Navigator Note  HFpEF 50 to 55% from March 2021.  Wall motion abnormality noted.  Mild LVH.  Normal right ventricular systolic function.  Echocardiogram is pending on this admission.  He presented to the emergency room with complaints of shortness of breath, lower extremity edema for several weeks, orthopnea, PND, dyspnea on exertion chest pain and a cough.  Patient states prior to going to his vacation in [place] they had spent the weekend in [place] and when he came home he noted a 10 to 15 pound weight gain but because the trip was paid for he felt he needed to go.   Comorbidities:  Nonobstructive coronary artery disease Nonischemic cardiomyopathy Obstructive sleep apnea Hypertension Diabetes Chronic kidney disease stage III  Labs:  Sodium 142, potassium 3.1, chloride 116, BUN 23, CO2 28 and creatinine 2.54, BNP on admission was 501, AST 18 ALT 14, albumin 3.3, alkaline phos 0.9 Weight is 107 kg Blood pressure 135/72   Medications:  Lasix 40 mg IV twice a day Potassium chloride 40 mill equivalents daily Atorvastatin 10 mg daily Isosorbide mononitrate 120 mg daily Losartan 50 mg daily Metoprolol succinate 100 mg daily 81 mg of aspirin daily   Initial meeting with patient in the emergency room, he was lying in bed in no acute distress.  Patient states prior to going to [facility] he had noted a weight gain after spending a weekend in [facility].  Discussed the importance of weights daily and what to report to physician.  Discussed low-sodium diet, he states that he does not use salt at the table.  He admitted not knowing what he should do for a fluid restriction, discussed no more than 64 ounces daily unless physician instructs him otherwise.  Talked about what constitutes a liquid.  Also discussed follow-up in the outpatient heart failure clinic, has an appointment with Darylene Price on November 11 at 1 PM.  He has a 5%  history of no-shows with 7 out of 142 appointments.  He was given written information about the heart failure clinic.  He was also given living with heart failure teaching booklet along with information on low-sodium and zone magnet.  He had no further questions, we will continue to follow along.  Pricilla Riffle RN [facility]

## 2020-12-16 NOTE — ED Provider Notes (Signed)
[facility]  ____________________________________________   Event Date/Time   First MD Initiated Contact with Patient 12/16/20 0503     (approximate)  I have reviewed the triage vital signs and the nursing notes.   HISTORY  Chief Complaint Shortness of Breath    HPI Roberto Allen is a 60 y.o. male with past medical history of HF, diabetes, hypertension, CKD who presents with lower extremity swelling and shortness of breath.  Patient tells notes increasing lower extremity swelling and shortness of breath over the last several weeks.  He was on vacation in [place] and his symptoms worsened over the last several days.  He endorses orthopnea and PND as well as significant dyspnea on exertion.  He can barely walk several steps without becoming very winded.  He also endorses some chest pain that is worse with coughing and when he gets short of breath.  Has had significant lower extremity swelling.  Thinks he has gained weight.  Prior to his vacation he was taking 20 mg of Lasix daily but then increased to 40 daily, does not think he is peeing as much.  Has had cough productive of clear phlegm no fevers or chills.         Past Medical History:  Diagnosis Date   CAD (coronary artery disease)    a. LHC 4/13: mD1 25%, oD2 25%, pOM1 25%, pRCA 25%, dRCA 40%, EF 30%   Chronic kidney disease (CKD), stage III (moderate) (HCC)    Chronic systolic heart failure (HCC)    a. EF 30-35% in 04/2011, improved to 55-60% in 01/2012.   COPD (chronic obstructive pulmonary disease) (HCC)    Diabetes mellitus    GERD (gastroesophageal reflux disease)    History of pneumonia    Hyperkalemia    a. 12/2012 - spironolactone, KCl supp discontinued.   Hyperlipidemia    Hypertension    Hypertriglyceridemia    a. 12/2012 - started on fenofibrate.   NICM (nonischemic cardiomyopathy) ([place])    a. Echo 3/13: mild LVH, EF 30-35%, grade 3 diast dysfxn, mod LAE;  RHC 4/13:  RA 8, RV  58/10, PA 56/18, mean 35, PCWP mean 17, LV 154/27, CO 4.3, CI 2.0. b. EF Improved >>> Echo (01/2012):  Mild LVH, EF 55-60%, no RWMA, Gr 1 DD    Patient Active Problem List   Diagnosis Date Noted   CHF (congestive heart failure) ([place]) 04/28/2019   Hypertension secondary to other renal disorders 11/29/2016   GERD (gastroesophageal reflux disease) 07/23/2016   Degenerative disc disease at L5-S1 level 04/09/2016   Primary osteoarthritis of right hip 04/09/2016   Obesity, Class I, BMI 30-34.9 02/06/2016   Ulcer of left foot ([place]) 01/06/2015   Microalbuminuria due to type 2 diabetes mellitus ([place]) 10/03/2014   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 diabetes mellitus ([place]) 06/26/2014   NICM (nonischemic cardiomyopathy) ([place]) 09/06/2013   ED (erectile dysfunction) 03/27/2013   Disorder of kidney and ureter 12/17/2012   DM (diabetes mellitus), type 2 with renal complications ([place]) 73/71/0626   Coronary Artery Disease (non-obstructive by cath 05/2011) 06/14/2011   Hyperlipidemia associated with type 2 diabetes mellitus ([place]) 06/14/2011   OSA (obstructive sleep apnea) 05/25/2011   Hypertension     Past Surgical History:  Procedure Laterality Date   CARDIAC CATHETERIZATION  2013   non-obs dz 25-40% multiple vessels   TENOTOMY  2018    Prior to Admission medications   Medication Sig Start Date End Date Taking? Authorizing Provider  ACCU-CHEK FASTCLIX LANCETS MISC USE TO MONITOR BLOOD GLUCOSE 3 TIME(S) DAILY 05/05/17   [provider]  aMILoride (MIDAMOR) 5 MG tablet Take 5 mg by mouth daily. 06/06/19   [provider]  amLODipine (NORVASC) 5 MG tablet Take 1 tablet (5 mg total) by mouth daily. 05/01/19   Geradine Girt, DO  aspirin EC 81 MG EC tablet Take 1 tablet (81 mg total) by mouth daily. 05/02/19   Geradine Girt, DO  atorvastatin (LIPITOR) 10 MG tablet Take 1 tablet (10 mg total) by mouth daily. 10/28/20   Tower, Wynelle Fanny, MD  BD PEN NEEDLE NANO U/F 32G X 4 MM MISC Inject 1 Units into  the skin as needed. 11/05/19   Elby Beck, FNP  Blood Glucose Monitoring Suppl (ONETOUCH VERIO REFLECT) w/Device KIT 1 Units by Does not apply route as needed. 11/28/20   Tower, Wynelle Fanny, MD  cholecalciferol (VITAMIN D) 25 MCG (1000 UNIT) tablet Take 1,000 Units by mouth daily. Patient not taking: Reported on 11/28/2020 05/25/19   [provider]  Cholecalciferol (VITAMIN D3) 50 MCG (2000 UT) TABS Take 1 tablet by mouth once a week. Patient not taking: No sig reported 07/25/19   [provider]  doxycycline (VIBRA-TABS) 100 MG tablet Take 1 tablet (100 mg total) by mouth 2 (two) times daily. 09/11/20   [name], DPM  ergocalciferol (VITAMIN D2) 1.25 MG (50000 UT) capsule Take by mouth. Patient not taking: No sig reported    [provider]  furosemide (LASIX) 20 MG tablet Take 1 tablet (20 mg total) by mouth daily for 7 days. 08/12/20 08/27/20  Croitoru, Mihai, MD  glucose blood (ONETOUCH VERIO) test strip 1 each by Other route as needed for other (3-4 times per day). Use as instructed 02/01/20   Elby Beck, FNP  glucose blood test strip To check glucose tid and prn for poorly controlled diabetes 12/02/20   Tower, Wynelle Fanny, MD  HUMALOG KWIKPEN 100 UNIT/ML KwikPen SMARTSIG:15 Unit(s) SUB-Q 3 Times Daily PRN 06/19/19   [provider]  hydrALAZINE (APRESOLINE) 25 MG tablet Take 1 tablet (25 mg total) by mouth 3 (three) times daily. 04/01/20   Tower, Wynelle Fanny, MD  isosorbide mononitrate (IMDUR) 120 MG 24 hr tablet Take 1 tablet (120 mg total) by mouth daily. 05/02/19   Eulogio Bear U, DO  JARDIANCE 25 MG TABS tablet TAKE 1 TABLET (25 MG TOTAL) BY MOUTH DAILY. 11/17/20   Tower, Wynelle Fanny, MD  LANTUS SOLOSTAR 100 UNIT/ML Solostar Pen Inject 50 Units into the skin at bedtime. 06/16/20   Tower, Wynelle Fanny, MD  losartan (COZAAR) 50 MG tablet TAKE 1 TABLET BY MOUTH EVERY DAY 12/12/20   Croitoru, Mihai, MD  magnesium oxide (MAG-OX) 400 (241.3 Mg) MG tablet Take 1  tablet (400 mg total) by mouth daily. 04/01/20   Tower, Wynelle Fanny, MD  metoprolol succinate (TOPROL-XL) 100 MG 24 hr tablet Take 100 mg by mouth daily.  06/22/16   [provider]  mupirocin ointment (BACTROBAN) 2 % Apply 1 application topically daily. 11/26/19   McDonald, Stephan Minister, DPM  omeprazole (PRILOSEC) 20 MG capsule TAKE 1 CAPSULE BY MOUTH TWICE A DAY BEFORE A MEAL 11/17/20   Tower, Wynelle Fanny, MD  OZEMPIC, 1 MG/DOSE, 4 MG/3ML SOPN INJECT 1 MG UNDER SKIN ONCE A WEEK 11/26/20   Tower, Wynelle Fanny, MD  potassium chloride SA (KLOR-CON) 20 MEQ tablet Take 1 tablet (20 mEq total) by mouth 2 (two) times  daily. 02/03/20   Davonna Belling, MD  silver sulfADIAZINE (SILVADENE) 1 % cream Apply 1 application topically daily. 12/13/19   [name], DPM  tadalafil (CIALIS) 20 MG tablet TAKE ONE TABLET BY MOUTH DAILY AS NEEDED FOR ERECTILE DYSFUNCTION 11/28/20   Tower, Wynelle Fanny, MD  potassium chloride (KLOR-CON) 20 MEQ packet Take 20 mEq by mouth 2 (two) times daily.  04/23/11  [provider]    Allergies Metformin and related  Family History  Problem Relation Age of Onset   Diabetes Sister    Breast cancer Mother    Breast cancer Maternal Grandmother     Social History Social History   Tobacco Use   Smoking status: Former    Packs/day: 1.00    Years: 20.00    Pack years: 20.00    Types: Cigarettes    Quit date: 09/15/2006    Years since quitting: 14.2   Smokeless tobacco: Never  Vaping Use   Vaping Use: Never used  Substance Use Topics   Alcohol use: Not Currently    Comment: occ   Drug use: No    Review of Systems   Review of Systems  Constitutional:  Negative for chills and fever.  Respiratory:  Positive for cough and shortness of breath. Negative for chest tightness.   Cardiovascular:  Positive for chest pain and leg swelling.  Gastrointestinal:  Negative for abdominal pain, nausea and vomiting.  All other systems reviewed and are negative.  Physical  Exam Updated Vital Signs BP (!) 198/109   Pulse 70   Temp 98.3 F (36.8 C) (Oral)   Resp 18   Ht 5' 11"  (1.803 m)   Wt 107 kg   SpO2 99%   BMI 32.92 kg/m   Physical Exam Vitals and nursing note reviewed.  Constitutional:      General: He is not in acute distress.    Appearance: Normal appearance.  HENT:     Head: Normocephalic and atraumatic.  Eyes:     General: No scleral icterus.    Conjunctiva/sclera: Conjunctivae normal.  Pulmonary:     Effort: Pulmonary effort is normal. No respiratory distress.     Breath sounds: Decreased breath sounds present. No wheezing.     Comments: Decreased breath sounds at the bases bilaterally Musculoskeletal:        General: No deformity or signs of injury.     Cervical back: Normal range of motion.     Right lower leg: Edema present.     Left lower leg: Edema present.     Comments: 2+ lower extremity edema bilaterally  Skin:    Coloration: Skin is not jaundiced or pale.  Neurological:     General: No focal deficit present.     Mental Status: He is alert and oriented to person, place, and time. Mental status is at baseline.  Psychiatric:        Mood and Affect: Mood normal.        Behavior: Behavior normal.     LABS (all labs ordered are listed, but only abnormal results are displayed)  Labs Reviewed  COMPREHENSIVE METABOLIC PANEL - Abnormal; Notable for the following components:      Result Value   Potassium 3.1 (*)    Glucose, Bld 127 (*)    Creatinine, Ser 2.54 (*)    Calcium 8.6 (*)    Albumin 3.3 (*)    GFR, Estimated 28 (*)    All other components within normal limits  BRAIN NATRIURETIC  PEPTIDE - Abnormal; Notable for the following components:   B Natriuretic Peptide 501.5 (*)    All other components within normal limits  CBC WITH DIFFERENTIAL/PLATELET - Abnormal; Notable for the following components:   RBC 3.86 (*)    Hemoglobin 12.3 (*)    HCT 35.5 (*)    All other components within normal limits  CBG  MONITORING, ED - Abnormal; Notable for the following components:   Glucose-Capillary 113 (*)    All other components within normal limits  TROPONIN I (HIGH SENSITIVITY) - Abnormal; Notable for the following components:   Troponin I (High Sensitivity) 37 (*)    All other components within normal limits  TROPONIN I (HIGH SENSITIVITY) - Abnormal; Notable for the following components:   Troponin I (High Sensitivity) 38 (*)    All other components within normal limits  RESP PANEL BY RT-PCR (FLU A&B, COVID) [id number]   ____________________________________________  EKG  NSR, nml axis, nml intervals, no acute ischemic changes  ____________________________________________  RADIOLOGY I, Madelin Headings, personally viewed and evaluated these images (plain radiographs) as part of my medical decision making, as well as reviewing the written report by the radiologist.  ED MD interpretation: I reviewed the chest x-ray which shows cardiomegaly and pulmonary venous congestion with pleural effusions bilaterally    ____________________________________________   PROCEDURES  Procedure(s) performed (including Critical Care):  Procedures   ____________________________________________   INITIAL IMPRESSION / ASSESSMENT AND PLAN / ED COURSE     Patient is a 60 year old male with past medical history of heart failure who presents with progressive shortness of breath and lower extremity edema.  He is hypertensive to about 505 systolic, and satting in the low 90s on room air.  He is not in any obvious respiratory distress does have decreased breath sounds at the bases.  He has obvious lower extremity edema.  Labs are notable for stable CKD, elevated BNP at around 500 and elevated troponin to 38.  EKG is nonischemic.  Chest x-ray does show some pulmonary edema and pleural effusions.  Clinical presentation is consistent with CHF exacerbation.  We will give a dose of Lasix and patient's p.o.  antihypertensives.  Given he has failed an increasing dose of Lasix at home and is significantly symptomatic will admit for IV diuresis and further management.   ____________________________________________   FINAL CLINICAL IMPRESSION(S) / ED DIAGNOSES  Final diagnoses:  Acute on chronic congestive heart failure, unspecified heart failure type [facility])     ED Discharge Orders     None        Note:  This document was prepared using Dragon voice recognition software and may include unintentional dictation errors.    Rada Hay, MD 12/16/20 [id number]

## 2020-12-16 NOTE — H&P (Addendum)
History and Physical    Tyra Michelle HAW:[id number] DOB: 1960-08-26 DOA: 12/16/2020  PCP: Abner Greenspan, MD   Patient coming from: Home  I have personally briefly reviewed patient's old medical records in [place]  Chief Complaint: Shortness of breath  HPI: Deitrich Steve is a 60 y.o. male with medical history significant for CAD, chronic diastolic dysfunction CHF, insulin-dependent diabetes mellitus with complications of stage III chronic kidney disease, COPD, GERD, hyperlipidemia, hypertension who presented to the emergency room for evaluation of worsening shortness of breath and lower extremity swelling.   He has had symptoms for several weeks but worse in the last couple of days.  He is now short of breath at rest and has 2 pillow orthopnea as well as paroxysmal nocturnal dyspnea.  He also thinks he has gained weight but is unable to tell me how much.  Due to his symptoms he increase his Lasix from 20 mg to 40 mg daily without any significant improvement in his symptoms.  He has a cough productive of clear phlegm but denies having any fever or chills. He denies having any abdominal pain, no changes in his bowel habits, no dizziness, no lightheadedness, no palpitations, no diaphoresis, no urinary symptoms, no headache, no blurred vision, no focal deficits, no anorexia. Labs show sodium 142, potassium 3.1, chloride 106, bicarb 28, glucose 127, BUN 23, creatinine 2.54, calcium 8.6, alkaline phosphatase 86, albumin 3.3, AST 18, ALT 14, total protein 7.0, BNP 501, troponin 37 >> 38, white count 7.6, hemoglobin 12.3, hematocrit 35.5, MCV 92.0, RDW 14.2, platelet count 313 Respiratory viral panel is negative Chest x-ray reviewed by me shows increased bilateral pleural effusions and bibasilar airspace disease. Twelve-lead EKG reviewed by me shows sinus rhythm with a first-degree AV block.   ED Course: Patient is a 60 year old male with a history of chronic diastolic dysfunction CHF,  coronary artery disease, insulin-dependent diabetes mellitus with complications of stage III chronic kidney disease who presents to the emergency room for evaluation of worsening shortness of breath associated with bilateral lower extremity swelling, orthopnea and PND. He has elevated BNP levels and imaging shows bilateral pleural effusions. He received a dose of Lasix in the emergency room and will be admitted to the hospital for further evaluation.    Review of Systems: As per HPI otherwise all other systems reviewed and negative.    Past Medical History:  Diagnosis Date   CAD (coronary artery disease)    a. LHC 4/13: mD1 25%, oD2 25%, pOM1 25%, pRCA 25%, dRCA 40%, EF 30%   Chronic kidney disease (CKD), stage III (moderate) (HCC)    Chronic systolic heart failure (HCC)    a. EF 30-35% in 04/2011, improved to 55-60% in 01/2012.   COPD (chronic obstructive pulmonary disease) (HCC)    Diabetes mellitus    GERD (gastroesophageal reflux disease)    History of pneumonia    Hyperkalemia    a. 12/2012 - spironolactone, KCl supp discontinued.   Hyperlipidemia    Hypertension    Hypertriglyceridemia    a. 12/2012 - started on fenofibrate.   NICM (nonischemic cardiomyopathy) ([place])    a. Echo 3/13: mild LVH, EF 30-35%, grade 3 diast dysfxn, mod LAE;  RHC 4/13:  RA 8, RV 58/10, PA 56/18, mean 35, PCWP mean 17, LV 154/27, CO 4.3, CI 2.0. b. EF Improved >>> Echo (01/2012):  Mild LVH, EF 55-60%, no RWMA, Gr 1 DD    Past Surgical History:  Procedure Laterality Date   CARDIAC  CATHETERIZATION  2013   non-obs dz 25-40% multiple vessels   TENOTOMY  2018     reports that he quit smoking about 14 years ago. His smoking use included cigarettes. He has a 20.00 pack-year smoking history. He has never used smokeless tobacco. He reports that he does not currently use alcohol. He reports that he does not use drugs.  Allergies  Allergen Reactions   Metformin And Related Other (See Comments)    Due to  CHF and CRI    Family History  Problem Relation Age of Onset   Diabetes Sister    Breast cancer Mother    Breast cancer Maternal Grandmother       Prior to Admission medications   Medication Sig Start Date End Date Taking? Authorizing Provider  amLODipine (NORVASC) 5 MG tablet Take 1 tablet (5 mg total) by mouth daily. 05/01/19  Yes Geradine Girt, DO  aspirin EC 81 MG EC tablet Take 1 tablet (81 mg total) by mouth daily. 05/02/19  Yes Vann, Jessica U, DO  atorvastatin (LIPITOR) 10 MG tablet Take 1 tablet (10 mg total) by mouth daily. 10/28/20  Yes Tower, Wynelle Fanny, MD  furosemide (LASIX) 20 MG tablet Take 1 tablet (20 mg total) by mouth daily for 7 days. 08/12/20 12/16/20 Yes Croitoru, Mihai, MD  hydrALAZINE (APRESOLINE) 25 MG tablet Take 1 tablet (25 mg total) by mouth 3 (three) times daily. 04/01/20  Yes Tower, Wynelle Fanny, MD  isosorbide mononitrate (IMDUR) 120 MG 24 hr tablet Take 1 tablet (120 mg total) by mouth daily. 05/02/19  Yes Vann, Jessica U, DO  JARDIANCE 25 MG TABS tablet TAKE 1 TABLET (25 MG TOTAL) BY MOUTH DAILY. 11/17/20  Yes Tower, Wynelle Fanny, MD  losartan (COZAAR) 50 MG tablet TAKE 1 TABLET BY MOUTH EVERY DAY Patient taking differently: Take 50 mg by mouth daily. 12/12/20  Yes Croitoru, Mihai, MD  magnesium oxide (MAG-OX) 400 (241.3 Mg) MG tablet Take 1 tablet (400 mg total) by mouth daily. 04/01/20  Yes Tower, Wynelle Fanny, MD  metoprolol succinate (TOPROL-XL) 100 MG 24 hr tablet Take 100 mg by mouth daily.  06/22/16  Yes [provider]  omeprazole (PRILOSEC) 20 MG capsule TAKE 1 CAPSULE BY MOUTH TWICE A DAY BEFORE A MEAL 11/17/20  Yes Tower, Marne A, MD  potassium chloride SA (KLOR-CON) 20 MEQ tablet Take 1 tablet (20 mEq total) by mouth 2 (two) times daily. 02/03/20  Yes Davonna Belling, MD  ACCU-CHEK FASTCLIX LANCETS MISC USE TO MONITOR BLOOD GLUCOSE 3 TIME(S) DAILY 05/05/17   [provider]  aMILoride (MIDAMOR) 5 MG tablet Take 5 mg by mouth daily. Patient not taking:  No sig reported 06/06/19   [provider]  BD PEN NEEDLE NANO U/F 32G X 4 MM MISC Inject 1 Units into the skin as needed. 11/05/19   Elby Beck, FNP  Blood Glucose Monitoring Suppl (ONETOUCH VERIO REFLECT) w/Device KIT 1 Units by Does not apply route as needed. 11/28/20   Tower, Wynelle Fanny, MD  cholecalciferol (VITAMIN D) 25 MCG (1000 UNIT) tablet Take 1,000 Units by mouth daily. Patient not taking: No sig reported 05/25/19   [provider]  Cholecalciferol (VITAMIN D3) 50 MCG (2000 UT) TABS Take 1 tablet by mouth once a week. Patient not taking: No sig reported 07/25/19   [provider]  doxycycline (VIBRA-TABS) 100 MG tablet Take 1 tablet (100 mg total) by mouth 2 (two) times daily. Patient not taking: No sig reported 09/11/20  [name], DPM  ergocalciferol (VITAMIN D2) 1.25 MG (50000 UT) capsule Take by mouth. Patient not taking: No sig reported    [provider]  glucose blood (ONETOUCH VERIO) test strip 1 each by Other route as needed for other (3-4 times per day). Use as instructed 02/01/20   Elby Beck, FNP  glucose blood test strip To check glucose tid and prn for poorly controlled diabetes 12/02/20   Tower, Wynelle Fanny, MD  HUMALOG KWIKPEN 100 UNIT/ML KwikPen SMARTSIG:15 Unit(s) SUB-Q 3 Times Daily PRN 06/19/19   [provider]  LANTUS SOLOSTAR 100 UNIT/ML Solostar Pen Inject 50 Units into the skin at bedtime. 06/16/20   Tower, Wynelle Fanny, MD  mupirocin ointment (BACTROBAN) 2 % Apply 1 application topically daily. 11/26/19   McDonald, Adam R, DPM  OZEMPIC, 1 MG/DOSE, 4 MG/3ML SOPN INJECT 1 MG UNDER SKIN ONCE A WEEK 11/26/20   Tower, Wynelle Fanny, MD  silver sulfADIAZINE (SILVADENE) 1 % cream Apply 1 application topically daily. Patient not taking: No sig reported 12/13/19   [name], DPM  tadalafil (CIALIS) 20 MG tablet TAKE ONE TABLET BY MOUTH DAILY AS NEEDED FOR ERECTILE DYSFUNCTION 11/28/20   Tower, Wynelle Fanny, MD  potassium  chloride (KLOR-CON) 20 MEQ packet Take 20 mEq by mouth 2 (two) times daily.  04/23/11  [provider]    Physical Exam: Vitals:   12/16/20 0600 12/16/20 0630 12/16/20 0700 12/16/20 0830  BP: (!) 199/114 (!) 211/109 (!) 194/118 (!) 191/113  Pulse: 74 75 71 80  Resp: 16 20 16    Temp:      TempSrc:      SpO2: 92% 93% 94% 98%  Weight:      Height:         Vitals:   12/16/20 0600 12/16/20 0630 12/16/20 0700 12/16/20 0830  BP: (!) 199/114 (!) 211/109 (!) 194/118 (!) 191/113  Pulse: 74 75 71 80  Resp: 16 20 16    Temp:      TempSrc:      SpO2: 92% 93% 94% 98%  Weight:      Height:          Constitutional: Alert and oriented x 3 . Not in any apparent distress HEENT:      Head: Normocephalic and atraumatic.         Eyes: PERLA, EOMI, Conjunctivae are normal. Sclera is non-icteric.       Mouth/Throat: Mucous membranes are moist.       Neck: Supple with no signs of meningismus. Cardiovascular: Regular rate and rhythm. No murmurs, gallops, or rubs. 2+ symmetrical distal pulses are present . No JVD. 2+ LE edema Respiratory: Respiratory effort normal.  Crackles in both lung bases bilaterally. No wheezes or rhonchi.  Gastrointestinal: Soft, non tender, and non distended with positive bowel sounds.  Central adiposity Genitourinary: No CVA tenderness. Musculoskeletal: Nontender with normal range of motion in all extremities. No cyanosis, or erythema of extremities. Neurologic:  Face is symmetric. Moving all extremities. No gross focal neurologic deficits . Skin: Skin is warm, dry.  No rash or ulcers Psychiatric: Mood and affect are normal    Labs on Admission: I have personally reviewed following labs and imaging studies  CBC: Recent Labs  Lab 12/15/20 1139  WBC 7.6  NEUTROABS 5.0  HGB 12.3*  HCT 35.5*  MCV 92.0  PLT 993   Basic Metabolic Panel: Recent Labs  Lab 12/15/20 1139  NA 142  K 3.1*  CL 106  CO2 28  GLUCOSE 127*  BUN 23  CREATININE 2.54*  CALCIUM  8.6*   GFR: Estimated Creatinine Clearance: 37.5 mL/min (A) (by C-G formula based on SCr of 2.54 mg/dL (H)). Liver Function Tests: Recent Labs  Lab 12/15/20 1139  AST 18  ALT 14  ALKPHOS 86  BILITOT 0.9  PROT 7.0  ALBUMIN 3.3*   No results for input(s): LIPASE, AMYLASE in the last 168 hours. No results for input(s): AMMONIA in the last 168 hours. Coagulation Profile: No results for input(s): INR, PROTIME in the last 168 hours. Cardiac Enzymes: No results for input(s): CKTOTAL, CKMB, CKMBINDEX, TROPONINI in the last 168 hours. BNP (last 3 results) No results for input(s): PROBNP in the last 8760 hours. HbA1C: No results for input(s): HGBA1C in the last 72 hours. CBG: Recent Labs  Lab 12/15/20 1823 12/16/20 0834  GLUCAP 113* 101*   Lipid Profile: No results for input(s): CHOL, HDL, LDLCALC, TRIG, CHOLHDL, LDLDIRECT in the last 72 hours. Thyroid Function Tests: No results for input(s): TSH, T4TOTAL, FREET4, T3FREE, THYROIDAB in the last 72 hours. Anemia Panel: No results for input(s): VITAMINB12, FOLATE, FERRITIN, TIBC, IRON, RETICCTPCT in the last 72 hours. Urine analysis:    Component Value Date/Time   COLORURINE STRAW (A) 02/02/2020 2247   APPEARANCEUR CLEAR 02/02/2020 2247   LABSPEC 1.026 02/02/2020 2247   PHURINE 5.0 02/02/2020 2247   GLUCOSEU >=500 (A) 02/02/2020 2247   HGBUR SMALL (A) 02/02/2020 2247   [place] NEGATIVE 02/02/2020 2247   [place] 02/02/2020 2247   PROTEINUR 100 (A) 02/02/2020 2247   NITRITE NEGATIVE 02/02/2020 2247   LEUKOCYTESUR NEGATIVE 02/02/2020 2247    Radiological Exams on Admission: DG Chest 2 View  Result Date: 12/15/2020 CLINICAL DATA:  Shortness of breath. EXAM: CHEST - 2 VIEW COMPARISON:  Two-view chest x-ray 07/29/2020 FINDINGS: The heart size is normal. Mild pulmonary vascular congestion is now present. Increased bilateral effusions and bibasilar airspace disease is noted. IMPRESSION: Increased bilateral  effusions and bibasilar airspace disease. While this may represent atelectasis, infection is not excluded. Electronically Signed   By: San Morelle M.D.   On: 12/15/2020 12:10     Assessment/Plan Principal Problem:   Acute on chronic diastolic CHF (congestive heart failure) (HCC) Active Problems:   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 diabetes mellitus (HCC)   GERD (gastroesophageal reflux disease)   Hypertensive urgency   Hypokalemia      Patient is a 60 year old male admitted to the hospital for acute exacerbation of his chronic diastolic dysfunction CHF and hypertensive urgency    Acute on chronic diastolic CHF  Most likely related to hypertensive urgency Will place patient on Lasix 40 mg IV twice daily Continue metoprolol, losartan and nitrates Last 2D echocardiogram from 2021 shows an LVEF of 50 to 55% We will repeat 2D echocardiogram Optimize blood pressure control We will consult cardiology     Hypertensive urgency Continue metoprolol, losartan and nitrates Uptitrate medications to optimize blood pressure control     Insulin-dependent diabetes mellitus with complications of stage III chronic kidney disease Renal function is stable Place patient on consistent carbohydrate diet Glycemic control with sliding scale insulin Monitor renal function closely while on diuretic therapy    Hypokalemia Most likely related to diuretic therapy Supplement potassium   DVT prophylaxis: Lovenox  Code Status: full code  Family Communication: Greater than 50% of time was spent discussing patient's condition with him and his wife at the bedside.  All questions and concerns have been  addressed.  He verbalized understanding and agree with the plan. Disposition Plan: Back to previous home environment Consults called: Cardiology Status:At the time of admission, it appears that the appropriate admission status for this patient is inpatient. This is judged to be reasonable and  necessary to provide the required intensity of service to ensure the patient's safety given the presenting symptoms, physical exam findings, and initial radiographic and laboratory data in the context of their comorbid conditions. Patient requires inpatient status due to high intensity of service, high risk for further deterioration and high frequency of surveillance required.     Collier Bullock MD [facility]     12/16/2020, 9:15 AM

## 2020-12-16 NOTE — Progress Notes (Signed)
*  PRELIMINARY RESULTS* Echocardiogram 2D Echocardiogram has been performed.  Sherrie Sport 12/16/2020, 1:57 PM

## 2020-12-17 ENCOUNTER — Encounter: Payer: Self-pay | Admitting: Internal Medicine

## 2020-12-17 DIAGNOSIS — I509 Heart failure, unspecified: Secondary | ICD-10-CM | POA: Diagnosis not present

## 2020-12-17 DIAGNOSIS — I160 Hypertensive urgency: Secondary | ICD-10-CM

## 2020-12-17 LAB — CBG MONITORING, ED
Glucose-Capillary: 141 mg/dL — ABNORMAL HIGH (ref 70–99)
Glucose-Capillary: 169 mg/dL — ABNORMAL HIGH (ref 70–99)

## 2020-12-17 LAB — BASIC METABOLIC PANEL
Anion gap: 10 (ref 5–15)
BUN: 30 mg/dL — ABNORMAL HIGH (ref 8–23)
CO2: 29 mmol/L (ref 22–32)
Calcium: 9.1 mg/dL (ref 8.9–10.3)
Chloride: 102 mmol/L (ref 98–111)
Creatinine, Ser: 2.81 mg/dL — ABNORMAL HIGH (ref 0.61–1.24)
GFR, Estimated: 25 mL/min — ABNORMAL LOW (ref >60)
Glucose, Bld: 174 mg/dL — ABNORMAL HIGH (ref 70–99)
Potassium: 3.5 mmol/L (ref 3.5–5.1)
Sodium: 141 mmol/L (ref 135–145)

## 2020-12-17 LAB — GLUCOSE, CAPILLARY
Glucose-Capillary: 220 mg/dL — ABNORMAL HIGH (ref 70–99)
Glucose-Capillary: 178 mg/dL — ABNORMAL HIGH (ref 70–99)

## 2020-12-17 MED ORDER — HYDRALAZINE HCL 25 MG PO TABS
25.0000 mg | ORAL_TABLET | Freq: Three times a day (TID) | ORAL | Status: DC
  Administered 2020-12-17 – 2020-12-18 (×4): 25 mg via ORAL
  Filled 2020-12-17 (×4): qty 1

## 2020-12-17 MED ORDER — AMLODIPINE BESYLATE 10 MG PO TABS
10.0000 mg | ORAL_TABLET | Freq: Every day | ORAL | Status: DC
  Administered 2020-12-17 – 2020-12-19 (×3): 10 mg via ORAL
  Filled 2020-12-17 (×2): qty 1
  Filled 2020-12-17: qty 2

## 2020-12-17 MED ORDER — FUROSEMIDE 10 MG/ML IJ SOLN
40.0000 mg | Freq: Every day | INTRAMUSCULAR | Status: DC
  Administered 2020-12-18 – 2020-12-19 (×2): 40 mg via INTRAVENOUS
  Filled 2020-12-17 (×2): qty 4

## 2020-12-17 MED ORDER — CARVEDILOL 25 MG PO TABS
25.0000 mg | ORAL_TABLET | Freq: Two times a day (BID) | ORAL | Status: DC
  Administered 2020-12-17 – 2020-12-19 (×4): 25 mg via ORAL
  Filled 2020-12-17 (×3): qty 1

## 2020-12-17 NOTE — ED Notes (Signed)
BP noted to be elevated. Patient reports "that is normal for me."  Will recheck BP after back pain improves

## 2020-12-17 NOTE — ED Notes (Signed)
Patient reports back pain.  Rates pain 7/10.  Medicated per PRN order

## 2020-12-17 NOTE — Progress Notes (Signed)
Progress Note  Patient Name: Roberto Allen Date of Encounter: 12/17/2020  [facility] Cardiologist: Sanda Klein, MD   Subjective   Shortness of breath is much improved, lower extremity swelling also improved.  Inpatient Medications    Scheduled Meds:  amLODipine  10 mg Oral Daily   aspirin EC  81 mg Oral Daily   atorvastatin  20 mg Oral Daily   carvedilol  25 mg Oral BID WC   enoxaparin (LOVENOX) injection  0.5 mg/kg Subcutaneous Q24H   [START ON 12/18/2020] furosemide  40 mg Intravenous Daily   hydrALAZINE  25 mg Oral Q8H   insulin aspart  0-15 Units Subcutaneous TID WC   isosorbide mononitrate  120 mg Oral Daily   pantoprazole  40 mg Oral Daily   potassium chloride  40 mEq Oral Daily   sodium chloride flush  3 mL Intravenous Q12H   Continuous Infusions:  sodium chloride     PRN Meds: sodium chloride, acetaminophen, ondansetron (ZOFRAN) IV, sodium chloride flush   Vital Signs    Vitals:   12/17/20 0815 12/17/20 1030 12/17/20 1035 12/17/20 1418  BP:  (!) 165/86  (!) 167/103  Pulse: 71 73 72 75  Resp: 19 (!) 21 (!) 21 18  Temp:    98.3 F (36.8 C)  TempSrc:    Oral  SpO2: 96% 96% 92% 94%  Weight:      Height:        Intake/Output Summary (Last 24 hours) at 12/17/2020 1542 Last data filed at 12/17/2020 0604 Gross per 24 hour  Intake --  Output 2470 ml  Net -2470 ml   Last 3 Weights 12/15/2020 11/28/2020 08/27/2020  Weight (lbs) 236 lb 225 lb 227 lb  Weight (kg) 107.049 kg 102.059 kg 102.967 kg      Telemetry    Sinus rhythm, heart rate 64- Personally Reviewed  ECG     - Personally Reviewed  Physical Exam   GEN: No acute distress.   Neck: No JVD Cardiac: RRR, no murmurs, rubs, or gallops.  Respiratory: Clear to auscultation bilaterally. GI: Soft, nontender, non-distended  MS: 1+ edema; No deformity. Neuro:  Nonfocal  Psych: Normal affect   Labs    High Sensitivity Troponin:   Recent Labs  Lab 12/15/20 1139 12/15/20 1635   TROPONINIHS 37* 38*     Chemistry Recent Labs  Lab 12/15/20 1139 12/16/20 1353 12/17/20 0659  NA 142  --  141  K 3.1*  --  3.5  CL 106  --  102  CO2 28  --  29  GLUCOSE 127*  --  174*  BUN 23  --  30*  CREATININE 2.54* 2.24* 2.81*  CALCIUM 8.6*  --  9.1  PROT 7.0  --   --   ALBUMIN 3.3*  --   --   AST 18  --   --   ALT 14  --   --   ALKPHOS 86  --   --   BILITOT 0.9  --   --   GFRNONAA 28* 32* 25*  ANIONGAP 8  --  10    Lipids No results for input(s): CHOL, TRIG, HDL, LABVLDL, LDLCALC, CHOLHDL in the last 168 hours.  Hematology Recent Labs  Lab 12/15/20 1139 12/16/20 1353  WBC 7.6 8.7  RBC 3.86* 3.98*  HGB 12.3* 12.7*  HCT 35.5* 36.9*  MCV 92.0 92.7  MCH 31.9 31.9  MCHC 34.6 34.4  RDW 14.2 13.9  PLT 313 317   Thyroid  No results for input(s): TSH, FREET4 in the last 168 hours.  BNP Recent Labs  Lab 12/15/20 1139  BNP 501.5*    DDimer No results for input(s): DDIMER in the last 168 hours.   Radiology    ECHOCARDIOGRAM COMPLETE  Result Date: 12/16/2020    ECHOCARDIOGRAM REPORT   [name] Date of Exam: 12/16/2020 Medical Rec #:  [id number]      Height:       71.0 in Accession #:    [id number]     Weight:       236.0 lb Date of Birth:  04-26-1960      BSA:          2.262 m Patient Age:    60 years       BP:           135/72 mmHg Patient Gender: M              HR:           77 bpm. Exam Location:  [facility] Procedure: 2D Echo, Cardiac Doppler and Color Doppler Indications:     CHF-acute diastolic B15.17  History:         Patient has prior history of Echocardiogram examinations, most                  recent 04/28/2019.  Sonographer:     Sherrie Sport Referring Phys:  [id number] AGBATA Diagnosing Phys: Nelva Bush MD  Sonographer Comments: Suboptimal apical window. IMPRESSIONS  1. Left ventricular ejection fraction, by estimation, is 40 to 45%. The left ventricle has mildly decreased function. The left ventricle demonstrates global hypokinesis.  There is more severe hypokinesis of the basal inferior/inferolateral segments. There is moderate left ventricular hypertrophy. Left ventricular diastolic parameters are consistent with Grade I diastolic dysfunction (impaired relaxation).  2. Right ventricular systolic function is normal. The right ventricular size is mildly enlarged. Tricuspid regurgitation signal is inadequate for assessing PA pressure.  3. Left atrial size was mildly dilated.  4. Right atrial size was moderately dilated.  5. The mitral valve is normal in structure. Trivial mitral valve regurgitation. No evidence of mitral stenosis.  6. The aortic valve has an indeterminant number of cusps. Aortic valve regurgitation is not visualized. No aortic stenosis is present. FINDINGS  Left Ventricle: Left ventricular ejection fraction, by estimation, is 40 to 45%. The left ventricle has mildly decreased function. The left ventricle demonstrates global hypokinesis. There is more severe hypokinesis of the basal inferior/inferolateral segments. The left ventricular internal cavity size was normal in size. There is moderate left ventricular hypertrophy. Left ventricular diastolic parameters are consistent with Grade I diastolic dysfunction (impaired relaxation). Right Ventricle: The right ventricular size is mildly enlarged. No increase in right ventricular wall thickness. Right ventricular systolic function is normal. Tricuspid regurgitation signal is inadequate for assessing PA pressure. Left Atrium: Left atrial size was mildly dilated. Right Atrium: Right atrial size was moderately dilated. Pericardium: The pericardium was not well visualized. Mitral Valve: The mitral valve is normal in structure. Trivial mitral valve regurgitation. No evidence of mitral valve stenosis. Tricuspid Valve: The tricuspid valve is not well visualized. Tricuspid valve regurgitation is trivial. Aortic Valve: The aortic valve has an indeterminant number of cusps. Aortic valve  regurgitation is not visualized. No aortic stenosis is present. Aortic valve mean gradient measures 2.0 mmHg. Aortic valve peak gradient measures 3.2 mmHg. Aortic valve area, by VTI measures 2.51 cm. Pulmonic Valve: The pulmonic  valve was grossly normal. Pulmonic valve regurgitation is trivial. No evidence of pulmonic stenosis. Aorta: The aortic root is normal in size and structure. Pulmonary Artery: The pulmonary artery is of normal size. Venous: The inferior vena cava was not well visualized. IAS/Shunts: The interatrial septum was not well visualized.  LEFT VENTRICLE PLAX 2D LVIDd:         4.42 cm      Diastology LVIDs:         3.66 cm      LV e' medial:    4.03 cm/s LV PW:         1.02 cm      LV E/e' medial:  11.6 LV IVS:        1.05 cm      LV e' lateral:   5.11 cm/s LVOT diam:     2.10 cm      LV E/e' lateral: 9.1 LV SV:         40 LV SV Index:   18 LVOT Area:     3.46 cm  LV Volumes (MOD) LV vol d, MOD A2C: 123.0 ml LV vol d, MOD A4C: 118.0 ml LV vol s, MOD A2C: 70.7 ml LV vol s, MOD A4C: 63.1 ml LV SV MOD A2C:     52.3 ml LV SV MOD A4C:     118.0 ml LV SV MOD BP:      59.3 ml RIGHT VENTRICLE RV Basal diam:  3.80 cm RV S prime:     12.50 cm/s TAPSE (M-mode): 2.6 cm LEFT ATRIUM             Index        RIGHT ATRIUM           Index LA diam:        4.30 cm 1.90 cm/m   RA Area:     26.50 cm LA Vol (A2C):   76.8 ml 33.95 ml/m  RA Volume:   92.10 ml  40.71 ml/m LA Vol (A4C):   71.7 ml 31.69 ml/m LA Biplane Vol: 74.8 ml 33.06 ml/m  AORTIC VALVE                    PULMONIC VALVE AV Area (Vmax):    2.16 cm     PV Vmax:        0.78 m/s AV Area (Vmean):   2.25 cm     PV Vmean:       49.550 cm/s AV Area (VTI):     2.51 cm     PV VTI:         0.140 m AV Vmax:           88.90 cm/s   PV Peak grad:   2.4 mmHg AV Vmean:          63.400 cm/s  PV Mean grad:   1.0 mmHg AV VTI:            0.159 m      RVOT Peak grad: 4 mmHg AV Peak Grad:      3.2 mmHg AV Mean Grad:      2.0 mmHg LVOT Vmax:         55.50 cm/s LVOT  Vmean:        41.200 cm/s LVOT VTI:          0.115 m LVOT/AV VTI ratio: 0.72  AORTA Ao Root diam: 3.07 cm MITRAL VALVE MV Area (PHT): 5.23 cm  SHUNTS MV Decel Time: 145 msec    Systemic VTI:  0.12 m MV E velocity: 46.70 cm/s  Systemic Diam: 2.10 cm MV A velocity: 70.30 cm/s  Pulmonic VTI:  0.172 m MV E/A ratio:  0.66 Nelva Bush MD Electronically signed by Nelva Bush MD Signature Date/Time: 12/16/2020/4:08:56 PM    Final     Cardiac Studies   Echo 12/17/2020 1. Left ventricular ejection fraction, by estimation, is 40 to 45%. The  left ventricle has mildly decreased function. The left ventricle  demonstrates global hypokinesis. There is more severe hypokinesis of the  basal inferior/inferolateral segments.  There is moderate left ventricular hypertrophy. Left ventricular diastolic  parameters are consistent with Grade I diastolic dysfunction (impaired  relaxation).   2. Right ventricular systolic function is normal. The right ventricular  size is mildly enlarged. Tricuspid regurgitation signal is inadequate for  assessing PA pressure.   3. Left atrial size was mildly dilated.   4. Right atrial size was moderately dilated.   5. The mitral valve is normal in structure. Trivial mitral valve  regurgitation. No evidence of mitral stenosis.   6. The aortic valve has an indeterminant number of cusps. Aortic valve  regurgitation is not visualized. No aortic stenosis is present.   Patient Profile     60 y.o. male with history of nonobstructive CAD, nonischemic cardiomyopathy, COPD, CKD 3, hypertension presenting with shortness of breath and edema being seen for acute CHF and volume overload.  Assessment & Plan    Cardiomyopathy, EF 40 to 45% -EF appears worse from prior -Still volume overloaded although improving -Creatinine worse -Decrease Lasix to 40 mg IV daily.  Monitor creatinine. -Plan Myoview tomorrow to evaluate high risk ischemia.  Avoiding contrast due to renal  dysfunction.  2.  Hypertension -BP elevated -Stop Toprol-XL, start Coreg 25 twice daily for additional BP benefit. -Losartan 50 mg daily, increase Norvasc to 10 mg daily.  PTA hydralazine, Imdur.  3.  Nonobstructive CAD -Aspirin, Lipitor increased to 20 mg daily.  Total encounter time 35 minutes  Greater than 50% was spent in counseling and coordination of care with the patient       Signed, Kate Sable, MD  12/17/2020, 3:42 PM

## 2020-12-17 NOTE — ED Notes (Signed)
Brandon RN aware of assigned bed °

## 2020-12-17 NOTE — Progress Notes (Signed)
PROGRESS NOTE    Roberto Allen  JEH:[id number] DOB: 1960/09/22 DOA: 12/16/2020 PCP: Abner Greenspan, MD   Brief Narrative: Taken from H&P.  Roberto Allen is a 60 y.o. male with medical history significant for CAD, chronic diastolic dysfunction CHF, insulin-dependent diabetes mellitus with complications of stage III chronic kidney disease, COPD, GERD, hyperlipidemia, hypertension who presented to the emergency room for evaluation of worsening shortness of breath and lower extremity swelling.  Due to his symptoms he increase his Lasix from 20 mg to 40 mg daily without any significant improvement in his symptoms.  He has a cough productive of clear phlegm but denies having any fever or chills. Chest x-ray with increased bilateral pleural effusions and bibasilar airspace disease. Admitted for acute on chronic HFpEF.  Prior echocardiogram with EF of 50 to 55%.  Repeat echocardiogram with reduced EF of 40 to 45%.  Cardiology was also consulted. Myoview is planned for tomorrow.  Subjective: Patient states that he seems improving when seen during morning rounds.  Denies any chest pain.  Lower extremity edema improved.  No oxygen use at baseline and he was saturating 100% on 2 L, took off from oxygen with no change in saturation.  Assessment & Plan:   Principal Problem:   Acute on chronic diastolic CHF (congestive heart failure) (HCC) Active Problems:   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 diabetes mellitus (HCC)   GERD (gastroesophageal reflux disease)   Hypertensive urgency   Hypokalemia  Acute HFrEF.  Patient has an history of chronic diastolic heart failure, repeat echocardiogram with reduced EF of 40 to 45%.  Clinically improving with IV diuresis.  Cardiology is on board. Mild worsening of creatinine. -Decrease IV Lasix to 40 mg daily. -Continue with daily BMP and weight. -Strict intake and output. -Going for Myoview tomorrow morning.  Hypertensive urgency.  Blood pressure improving. Holding  losartan today due to mild increase in creatinine. -Starting on amlodipine and hydralazine. -Continue with Imdur -Metoprolol was changed with carvedilol by cardiology.  Insulin-dependent diabetes mellitus with renal complications. -Continue with SSI  AKI with CKD stage IIIb.  Patient is on IV diuretic. -Monitor renal function -Avoid nephrotoxins  Hypokalemia.  Resolved. -Monitor potassium closely while patient is being diuresed.  Objective: Vitals:   12/17/20 0815 12/17/20 1030 12/17/20 1035 12/17/20 1418  BP:  (!) 165/86  (!) 167/103  Pulse: 71 73 72 75  Resp: 19 (!) 21 (!) 21 18  Temp:    98.3 F (36.8 C)  TempSrc:    Oral  SpO2: 96% 96% 92% 94%  Weight:      Height:        Intake/Output Summary (Last 24 hours) at 12/17/2020 1617 Last data filed at 12/17/2020 0604 Gross per 24 hour  Intake --  Output 2470 ml  Net -2470 ml   Filed Weights   12/15/20 1137  Weight: 107 kg    Examination:  General exam: Appears calm and comfortable  Respiratory system: Clear to auscultation. Respiratory effort normal. Cardiovascular system: S1 & S2 heard, RRR.  Gastrointestinal system: Soft, nontender, nondistended, bowel sounds positive. Central nervous system: Alert and oriented. No focal neurological deficits. Extremities: No edema, no cyanosis, pulses intact and symmetrical. Psychiatry: Judgement and insight appear normal.     DVT prophylaxis: Lovenox Code Status: Full Family Communication:  Disposition Plan:  Status is: Inpatient  Remains inpatient appropriate because: Severity of illness   Level of care: Progressive Cardiac  All the records are reviewed and case discussed with Care  Management/Social Worker. Management plans discussed with the patient, nursing and they are in agreement.  Consultants:  Cardiology  Procedures:  Antimicrobials:   Data Reviewed: I have personally reviewed following labs and imaging studies  CBC: Recent Labs  Lab 12/15/20 1139  12/16/20 1353  WBC 7.6 8.7  NEUTROABS 5.0  --   HGB 12.3* 12.7*  HCT 35.5* 36.9*  MCV 92.0 92.7  PLT 313 093   Basic Metabolic Panel: Recent Labs  Lab 12/15/20 1139 12/16/20 1353 12/17/20 0659  NA 142  --  141  K 3.1*  --  3.5  CL 106  --  102  CO2 28  --  29  GLUCOSE 127*  --  174*  BUN 23  --  30*  CREATININE 2.54* 2.24* 2.81*  CALCIUM 8.6*  --  9.1   GFR: Estimated Creatinine Clearance: 33.9 mL/min (A) (by C-G formula based on SCr of 2.81 mg/dL (H)). Liver Function Tests: Recent Labs  Lab 12/15/20 1139  AST 18  ALT 14  ALKPHOS 86  BILITOT 0.9  PROT 7.0  ALBUMIN 3.3*   No results for input(s): LIPASE, AMYLASE in the last 168 hours. No results for input(s): AMMONIA in the last 168 hours. Coagulation Profile: No results for input(s): INR, PROTIME in the last 168 hours. Cardiac Enzymes: No results for input(s): CKTOTAL, CKMB, CKMBINDEX, TROPONINI in the last 168 hours. BNP (last 3 results) No results for input(s): PROBNP in the last 8760 hours. HbA1C: No results for input(s): HGBA1C in the last 72 hours. CBG: Recent Labs  Lab 12/16/20 1353 12/16/20 1733 12/16/20 2256 12/17/20 0908 12/17/20 1156  GLUCAP 132* 123* 161* 141* 169*   Lipid Profile: No results for input(s): CHOL, HDL, LDLCALC, TRIG, CHOLHDL, LDLDIRECT in the last 72 hours. Thyroid Function Tests: No results for input(s): TSH, T4TOTAL, FREET4, T3FREE, THYROIDAB in the last 72 hours. Anemia Panel: No results for input(s): VITAMINB12, FOLATE, FERRITIN, TIBC, IRON, RETICCTPCT in the last 72 hours. Sepsis Labs: No results for input(s): PROCALCITON, LATICACIDVEN in the last 168 hours.  Recent Results (from the past 240 hour(s))  Resp Panel by RT-PCR (Flu A&B, Covid) Nasopharyngeal Swab     Status: None   Collection Time: 12/15/20  9:26 PM   Specimen: Nasopharyngeal Swab; Nasopharyngeal(NP) swabs in vial transport medium  Result Value Ref Range Status   SARS Coronavirus 2 by RT PCR NEGATIVE  NEGATIVE Final    Comment: (NOTE) SARS-CoV-2 target nucleic acids are NOT DETECTED.  The SARS-CoV-2 RNA is generally detectable in upper respiratory specimens during the acute phase of infection. The lowest concentration of SARS-CoV-2 viral copies this assay can detect is 138 copies/mL. A negative result does not preclude SARS-Cov-2 infection and should not be used as the sole basis for treatment or other patient management decisions. A negative result may occur with  improper specimen collection/handling, submission of specimen other than nasopharyngeal swab, presence of viral mutation(s) within the areas targeted by this assay, and inadequate number of viral copies(<138 copies/mL). A negative result must be combined with clinical observations, patient history, and epidemiological information. The expected result is Negative.  Fact Sheet for Patients:  EntrepreneurPulse.com.au  Fact Sheet for Healthcare Providers:  IncredibleEmployment.be  This test is no t yet approved or cleared by the [place] FDA and  has been authorized for detection and/or diagnosis of SARS-CoV-2 by FDA under an Emergency Use Authorization (EUA). This EUA will remain  in effect (meaning this test can be used) for the duration of the  COVID-19 declaration under Section 564(b)(1) of the Act, 21 U.S.C.section 360bbb-3(b)(1), unless the authorization is terminated  or revoked sooner.       Influenza A by PCR NEGATIVE NEGATIVE Final   Influenza B by PCR NEGATIVE NEGATIVE Final    Comment: (NOTE) The Xpert Xpress SARS-CoV-2/FLU/RSV plus assay is intended as an aid in the diagnosis of influenza from Nasopharyngeal swab specimens and should not be used as a sole basis for treatment. Nasal washings and aspirates are unacceptable for Xpert Xpress SARS-CoV-2/FLU/RSV testing.  Fact Sheet for Patients: EntrepreneurPulse.com.au  Fact Sheet for Healthcare  Providers: IncredibleEmployment.be  This test is not yet approved or cleared by the [place] FDA and has been authorized for detection and/or diagnosis of SARS-CoV-2 by FDA under an Emergency Use Authorization (EUA). This EUA will remain in effect (meaning this test can be used) for the duration of the COVID-19 declaration under Section 564(b)(1) of the Act, 21 U.S.C. section 360bbb-3(b)(1), unless the authorization is terminated or revoked.  Performed at [facility], [address], [place], [place] [address]      Radiology Studies: ECHOCARDIOGRAM COMPLETE  Result Date: 12/16/2020    ECHOCARDIOGRAM REPORT   [name] Date of Exam: 12/16/2020 Medical Rec #:  [id number]      Height:       71.0 in Accession #:    [id number]     Weight:       236.0 lb Date of Birth:  1960/09/17      BSA:          2.262 m Patient Age:    51 years       BP:           135/72 mmHg Patient Gender: M              HR:           77 bpm. Exam Location:  [facility] Procedure: 2D Echo, Cardiac Doppler and Color Doppler Indications:     CHF-acute diastolic F75.10  History:         Patient has prior history of Echocardiogram examinations, most                  recent 04/28/2019.  Sonographer:     Sherrie Sport Referring Phys:  [id number] AGBATA Diagnosing Phys: Nelva Bush MD  Sonographer Comments: Suboptimal apical window. IMPRESSIONS  1. Left ventricular ejection fraction, by estimation, is 40 to 45%. The left ventricle has mildly decreased function. The left ventricle demonstrates global hypokinesis. There is more severe hypokinesis of the basal inferior/inferolateral segments. There is moderate left ventricular hypertrophy. Left ventricular diastolic parameters are consistent with Grade I diastolic dysfunction (impaired relaxation).  2. Right ventricular systolic function is normal. The right ventricular size is mildly enlarged. Tricuspid regurgitation signal is inadequate  for assessing PA pressure.  3. Left atrial size was mildly dilated.  4. Right atrial size was moderately dilated.  5. The mitral valve is normal in structure. Trivial mitral valve regurgitation. No evidence of mitral stenosis.  6. The aortic valve has an indeterminant number of cusps. Aortic valve regurgitation is not visualized. No aortic stenosis is present. FINDINGS  Left Ventricle: Left ventricular ejection fraction, by estimation, is 40 to 45%. The left ventricle has mildly decreased function. The left ventricle demonstrates global hypokinesis. There is more severe hypokinesis of the basal inferior/inferolateral segments. The left ventricular internal cavity size was normal in size. There is moderate left ventricular hypertrophy.  Left ventricular diastolic parameters are consistent with Grade I diastolic dysfunction (impaired relaxation). Right Ventricle: The right ventricular size is mildly enlarged. No increase in right ventricular wall thickness. Right ventricular systolic function is normal. Tricuspid regurgitation signal is inadequate for assessing PA pressure. Left Atrium: Left atrial size was mildly dilated. Right Atrium: Right atrial size was moderately dilated. Pericardium: The pericardium was not well visualized. Mitral Valve: The mitral valve is normal in structure. Trivial mitral valve regurgitation. No evidence of mitral valve stenosis. Tricuspid Valve: The tricuspid valve is not well visualized. Tricuspid valve regurgitation is trivial. Aortic Valve: The aortic valve has an indeterminant number of cusps. Aortic valve regurgitation is not visualized. No aortic stenosis is present. Aortic valve mean gradient measures 2.0 mmHg. Aortic valve peak gradient measures 3.2 mmHg. Aortic valve area, by VTI measures 2.51 cm. Pulmonic Valve: The pulmonic valve was grossly normal. Pulmonic valve regurgitation is trivial. No evidence of pulmonic stenosis. Aorta: The aortic root is normal in size and structure.  Pulmonary Artery: The pulmonary artery is of normal size. Venous: The inferior vena cava was not well visualized. IAS/Shunts: The interatrial septum was not well visualized.  LEFT VENTRICLE PLAX 2D LVIDd:         4.42 cm      Diastology LVIDs:         3.66 cm      LV e' medial:    4.03 cm/s LV PW:         1.02 cm      LV E/e' medial:  11.6 LV IVS:        1.05 cm      LV e' lateral:   5.11 cm/s LVOT diam:     2.10 cm      LV E/e' lateral: 9.1 LV SV:         40 LV SV Index:   18 LVOT Area:     3.46 cm  LV Volumes (MOD) LV vol d, MOD A2C: 123.0 ml LV vol d, MOD A4C: 118.0 ml LV vol s, MOD A2C: 70.7 ml LV vol s, MOD A4C: 63.1 ml LV SV MOD A2C:     52.3 ml LV SV MOD A4C:     118.0 ml LV SV MOD BP:      59.3 ml RIGHT VENTRICLE RV Basal diam:  3.80 cm RV S prime:     12.50 cm/s TAPSE (M-mode): 2.6 cm LEFT ATRIUM             Index        RIGHT ATRIUM           Index LA diam:        4.30 cm 1.90 cm/m   RA Area:     26.50 cm LA Vol (A2C):   76.8 ml 33.95 ml/m  RA Volume:   92.10 ml  40.71 ml/m LA Vol (A4C):   71.7 ml 31.69 ml/m LA Biplane Vol: 74.8 ml 33.06 ml/m  AORTIC VALVE                    PULMONIC VALVE AV Area (Vmax):    2.16 cm     PV Vmax:        0.78 m/s AV Area (Vmean):   2.25 cm     PV Vmean:       49.550 cm/s AV Area (VTI):     2.51 cm     PV VTI:  0.140 m AV Vmax:           88.90 cm/s   PV Peak grad:   2.4 mmHg AV Vmean:          63.400 cm/s  PV Mean grad:   1.0 mmHg AV VTI:            0.159 m      RVOT Peak grad: 4 mmHg AV Peak Grad:      3.2 mmHg AV Mean Grad:      2.0 mmHg LVOT Vmax:         55.50 cm/s LVOT Vmean:        41.200 cm/s LVOT VTI:          0.115 m LVOT/AV VTI ratio: 0.72  AORTA Ao Root diam: 3.07 cm MITRAL VALVE MV Area (PHT): 5.23 cm    SHUNTS MV Decel Time: 145 msec    Systemic VTI:  0.12 m MV E velocity: 46.70 cm/s  Systemic Diam: 2.10 cm MV A velocity: 70.30 cm/s  Pulmonic VTI:  0.172 m MV E/A ratio:  0.66 Christopher End MD Electronically signed by Nelva Bush MD  Signature Date/Time: 12/16/2020/4:08:56 PM    Final     Scheduled Meds:  amLODipine  10 mg Oral Daily   aspirin EC  81 mg Oral Daily   atorvastatin  20 mg Oral Daily   carvedilol  25 mg Oral BID WC   enoxaparin (LOVENOX) injection  0.5 mg/kg Subcutaneous Q24H   [START ON 12/18/2020] furosemide  40 mg Intravenous Daily   hydrALAZINE  25 mg Oral Q8H   insulin aspart  0-15 Units Subcutaneous TID WC   isosorbide mononitrate  120 mg Oral Daily   pantoprazole  40 mg Oral Daily   potassium chloride  40 mEq Oral Daily   sodium chloride flush  3 mL Intravenous Q12H   Continuous Infusions:  sodium chloride       LOS: 1 day   Time spent: 45 minutes. More than 50% of the time was spent in counseling/coordination of care  Lorella Nimrod, MD [facility]  If 7PM-7AM, please contact night-coverage [email or web address]  12/17/2020, 4:17 PM   This record has been created using Systems analyst. Errors have been sought and corrected,but may not always be located. Such creation errors do not reflect on the standard of care.

## 2020-12-18 ENCOUNTER — Encounter: Payer: Self-pay | Admitting: Internal Medicine

## 2020-12-18 ENCOUNTER — Inpatient Hospital Stay (HOSPITAL_COMMUNITY): Payer: 59

## 2020-12-18 DIAGNOSIS — E1165 Type 2 diabetes mellitus with hyperglycemia: Secondary | ICD-10-CM

## 2020-12-18 DIAGNOSIS — I509 Heart failure, unspecified: Secondary | ICD-10-CM

## 2020-12-18 LAB — BASIC METABOLIC PANEL
Anion gap: 8 (ref 5–15)
BUN: 30 mg/dL — ABNORMAL HIGH (ref 8–23)
CO2: 25 mmol/L (ref 22–32)
Calcium: 8.5 mg/dL — ABNORMAL LOW (ref 8.9–10.3)
Chloride: 104 mmol/L (ref 98–111)
Creatinine, Ser: 2.55 mg/dL — ABNORMAL HIGH (ref 0.61–1.24)
GFR, Estimated: 28 mL/min — ABNORMAL LOW (ref >60)
Glucose, Bld: 240 mg/dL — ABNORMAL HIGH (ref 70–99)
Potassium: 3.6 mmol/L (ref 3.5–5.1)
Sodium: 137 mmol/L (ref 135–145)

## 2020-12-18 LAB — NM MYOCAR MULTI W/SPECT W/WALL MOTION / EF
LV dias vol: 178 mL (ref 62–150)
LV sys vol: 94 mL
Nuc Stress EF: 44 %
Peak HR: 96 {beats}/min
Percent HR: 60 %
Rest HR: 76 {beats}/min
Rest Nuclear Isotope Dose: 10.4 mCi
SDS: 11
SRS: 4
SSS: 15
ST Depression (mm): 0 mm
Stress Nuclear Isotope Dose: 31.6 mCi
TID: 0.95

## 2020-12-18 LAB — GLUCOSE, CAPILLARY
Glucose-Capillary: 226 mg/dL — ABNORMAL HIGH (ref 70–99)
Glucose-Capillary: 205 mg/dL — ABNORMAL HIGH (ref 70–99)
Glucose-Capillary: 203 mg/dL — ABNORMAL HIGH (ref 70–99)
Glucose-Capillary: 214 mg/dL — ABNORMAL HIGH (ref 70–99)

## 2020-12-18 MED ORDER — TECHNETIUM TC 99M TETROFOSMIN IV KIT
10.0000 | PACK | Freq: Once | INTRAVENOUS | Status: AC
  Administered 2020-12-18: 10.41 via INTRAVENOUS

## 2020-12-18 MED ORDER — TECHNETIUM TC 99M TETROFOSMIN IV KIT
31.6100 | PACK | Freq: Once | INTRAVENOUS | Status: AC | PRN
  Administered 2020-12-18: 31.61 via INTRAVENOUS

## 2020-12-18 MED ORDER — HYDRALAZINE HCL 50 MG PO TABS
50.0000 mg | ORAL_TABLET | Freq: Three times a day (TID) | ORAL | Status: DC
  Administered 2020-12-18 – 2020-12-19 (×4): 50 mg via ORAL
  Filled 2020-12-18 (×4): qty 1

## 2020-12-18 MED ORDER — REGADENOSON 0.4 MG/5ML IV SOLN
0.4000 mg | Freq: Once | INTRAVENOUS | Status: AC
  Administered 2020-12-18: 0.4 mg via INTRAVENOUS

## 2020-12-18 MED ORDER — INSULIN GLARGINE-YFGN 100 UNIT/ML ~~LOC~~ SOLN
10.0000 [IU] | Freq: Two times a day (BID) | SUBCUTANEOUS | Status: DC
  Administered 2020-12-18 – 2020-12-19 (×3): 10 [IU] via SUBCUTANEOUS
  Filled 2020-12-18 (×4): qty 0.1

## 2020-12-18 NOTE — Progress Notes (Signed)
Progress Note  Patient Name: Roberto Allen Date of Encounter: 12/18/2020  Primary Cardiologist: Croitoru  Subjective   No chest pain or dyspnea.  No documented urine output for the admission.  Weight documented at 103.8 kg this morning which is consistent with his last outpatient clinic weight in 08/2020.  He is for [facility] MPI today.  Renal function stable.  BP remains elevated.  Inpatient Medications    Scheduled Meds:  amLODipine  10 mg Oral Daily   aspirin EC  81 mg Oral Daily   atorvastatin  20 mg Oral Daily   carvedilol  25 mg Oral BID WC   enoxaparin (LOVENOX) injection  0.5 mg/kg Subcutaneous Q24H   furosemide  40 mg Intravenous Daily   hydrALAZINE  25 mg Oral Q8H   insulin aspart  0-15 Units Subcutaneous TID WC   isosorbide mononitrate  120 mg Oral Daily   pantoprazole  40 mg Oral Daily   potassium chloride  40 mEq Oral Daily   sodium chloride flush  3 mL Intravenous Q12H   Continuous Infusions:  sodium chloride     PRN Meds: sodium chloride, acetaminophen, ondansetron (ZOFRAN) IV, sodium chloride flush   Vital Signs    Vitals:   12/17/20 1914 12/17/20 2353 12/18/20 0435 12/18/20 0715  BP: 125/71 136/74 (!) 158/86 (!) 168/84  Pulse: 82 79 75 77  Resp: 18 18 18 16   Temp: 98.3 F (36.8 C) 98.1 F (36.7 C) 98 F (36.7 C) 98.1 F (36.7 C)  TempSrc:      SpO2: 96% 91% 97% 92%  Weight:   103.8 kg   Height:       No intake or output data in the 24 hours ending 12/18/20 0750 Filed Weights   12/15/20 1137 12/18/20 0435  Weight: 107 kg 103.8 kg    Telemetry    SR - Personally Reviewed  ECG    No new tracings - Personally Reviewed  Physical Exam   GEN: No acute distress.   Neck: No JVD. Cardiac: RRR, I/VI systolic murmur, no rubs, or gallops.  Respiratory: Clear to auscultation bilaterally.  GI: Soft, nontender, non-distended.   MS: Trace lower extremity edema; No deformity. Neuro:  Alert and oriented x 3; Nonfocal.  Psych: Normal  affect.  Labs    Chemistry Recent Labs  Lab 12/15/20 1139 12/16/20 1353 12/17/20 0659 12/18/20 0422  NA 142  --  141 137  K 3.1*  --  3.5 3.6  CL 106  --  102 104  CO2 28  --  29 25  GLUCOSE 127*  --  174* 240*  BUN 23  --  30* 30*  CREATININE 2.54* 2.24* 2.81* 2.55*  CALCIUM 8.6*  --  9.1 8.5*  PROT 7.0  --   --   --   ALBUMIN 3.3*  --   --   --   AST 18  --   --   --   ALT 14  --   --   --   ALKPHOS 86  --   --   --   BILITOT 0.9  --   --   --   GFRNONAA 28* 32* 25* 28*  ANIONGAP 8  --  10 8     Hematology Recent Labs  Lab 12/15/20 1139 12/16/20 1353  WBC 7.6 8.7  RBC 3.86* 3.98*  HGB 12.3* 12.7*  HCT 35.5* 36.9*  MCV 92.0 92.7  MCH 31.9 31.9  MCHC 34.6 34.4  RDW 14.2  13.9  PLT 313 317    Cardiac EnzymesNo results for input(s): TROPONINI in the last 168 hours. No results for input(s): TROPIPOC in the last 168 hours.   BNP Recent Labs  Lab 12/15/20 1139  BNP 501.5*     DDimer No results for input(s): DDIMER in the last 168 hours.   Radiology    CXR 12/15/2020: IMPRESSION: Increased bilateral effusions and bibasilar airspace disease. While this may represent atelectasis, infection is not excluded.  Cardiac Studies   2D echo 12/16/2020: 1. Left ventricular ejection fraction, by estimation, is 40 to 45%. The  left ventricle has mildly decreased function. The left ventricle  demonstrates global hypokinesis. There is more severe hypokinesis of the  basal inferior/inferolateral segments.  There is moderate left ventricular hypertrophy. Left ventricular diastolic  parameters are consistent with Grade I diastolic dysfunction (impaired  relaxation).   2. Right ventricular systolic function is normal. The right ventricular  size is mildly enlarged. Tricuspid regurgitation signal is inadequate for  assessing PA pressure.   3. Left atrial size was mildly dilated.   4. Right atrial size was moderately dilated.   5. The mitral valve is normal in  structure. Trivial mitral valve  regurgitation. No evidence of mitral stenosis.   6. The aortic valve has an indeterminant number of cusps. Aortic valve  regurgitation is not visualized. No aortic stenosis is present. __________  2D echo 04/28/2019:  1. Left ventricular ejection fraction, by estimation, is 50 to 55%. The  left ventricle has low normal function. The left ventricle demonstrates  regional wall motion abnormalities (see scoring diagram/findings for  description). There is mild left  ventricular hypertrophy. Left ventricular diastolic parameters are  indeterminate.   2. Right ventricular systolic function is normal. The right ventricular  size is normal. Tricuspid regurgitation signal is inadequate for assessing  PA pressure.   3. The mitral valve is normal in structure. No evidence of mitral valve  regurgitation. No evidence of mitral stenosis.   4. The aortic valve is normal in structure. Aortic valve regurgitation is  trivial. No aortic stenosis is present.   5. The inferior vena cava is normal in size with greater than 50%  respiratory variability, suggesting right atrial pressure of 3 mmHg.  __________  Carlton Adam MPI 12/2012: IMPRESSION:  Normal myocardial perfusion examination. No evidence for  pharmacological induced ischemia.   Calculated ejection fraction is 59% with normal wall motion.  Patient Profile     60 y.o. male with history of HFrEF with recovery of EF, nonobstructive CAD by LHC in 2013, CKD stage IIIb, DM2, HTN, HLD, COPD, GERD, and OSA who is being seen for evaluation of acute on chronic HFrEF at the request of Dr. Francine Graven.  Assessment & Plan    1.  Acute on chronic HFrEF secondary to NICM: -Possibly hypertensive heart disease, as he reports a long history of significant hypertension  -Subjectively, volume status is improving, no documented UOP -Ideally, with underlying cardiomyopathy, would favor discontinuation of amlodipine, though with  persistently elevated BP and in the context of renal dysfunction, options are limited -ARB was stopped due to renal dysfunction -Unable to add ACEi/ARB/ARNI/MRA/SGLT2i at this time with CKD -Continue Coreg -Currently on Imdur 120 mg and hydralazine 25 mg q 8 hours, titrate hydralazine to 50 mg tid -Lower dose IV Lasix 40 mg is ordered for today -CHF education -Recommend daily standing scale weights and strict I's/O's  2.  Nonobstructive CAD with minimally elevated high-sensitivity troponin: -No chest pain -Minimally  elevated and flat trending high-sensitivity troponin not consistent with ACS and likely secondary to supply demand ischemia with known nonobstructive CAD, volume overload, and underlying CKD -Planning for Lexiscan MPI today to evaluate for high risk ischemia given preference to avoid contrast secondary to underlying CKD -Continue aspirin, atorvastatin, carvedilol, and Imdur -Aggressive resector modification  3. CKD stage IIIb: -Stable -Monitor with diuresis  4. HTN: -Blood pressure remains mildly elevated this morning -Medications as above  For questions or updates, please contact [place] Please consult [email or web address] for contact info under Cardiology/STEMI.    Signed, Christell Faith, PA-C [place] Pager: [phone] 12/18/2020, 7:50 AM

## 2020-12-18 NOTE — Progress Notes (Signed)
PROGRESS NOTE    Roberto Allen  JYN:[id number] DOB: 04-19-1960 DOA: 12/16/2020 PCP: Abner Greenspan, MD   Brief Narrative: Taken from H&P.  Roberto Allen is a 60 y.o. male with medical history significant for CAD, chronic diastolic dysfunction CHF, insulin-dependent diabetes mellitus with complications of stage III chronic kidney disease, COPD, GERD, hyperlipidemia, hypertension who presented to the emergency room for evaluation of worsening shortness of breath and lower extremity swelling.  Due to his symptoms he increase his Lasix from 20 mg to 40 mg daily without any significant improvement in his symptoms.  He has a cough productive of clear phlegm but denies having any fever or chills. Chest x-ray with increased bilateral pleural effusions and bibasilar airspace disease. Admitted for acute on chronic HFpEF.  Prior echocardiogram with EF of 50 to 55%.  Repeat echocardiogram with reduced EF of 40 to 45%.  Cardiology was also consulted. Myoview is planned for today.  Subjective: Patient was seen after getting the Myoview.  Denies any chest pain or shortness of breath.  Having some headache and was concerned about his elevated blood pressure.  Assessment & Plan:   Principal Problem:   Acute on chronic diastolic CHF (congestive heart failure) (HCC) Active Problems:   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 diabetes mellitus (HCC)   GERD (gastroesophageal reflux disease)   Hypertensive urgency   Hypokalemia  Acute HFrEF.  Patient has an history of chronic diastolic heart failure, repeat echocardiogram with reduced EF of 40 to 45%.  Clinically improving with IV diuresis.  Cardiology is on board. Mild improvement in creatinine after decreasing the dose of Lasix. -Continue IV Lasix to 40 mg daily. -Continue with daily BMP and weight. -Strict intake and output. -Had his Myoview-pending result  Hypertensive urgency.  Blood pressure elevated in the morning.  Metoprolol was changed with carvedilol by  cardiology.  He was also started on amlodipine and hydralazine Continue holding losartan for another day. -Continue amlodipine -Increase the dose of hydralazine to 50 mg every 8 hourly -Continue with Imdur -Continue carvedilol  Insulin-dependent diabetes mellitus with renal complications.  CBG elevated -Add Smitley 10 units twice daily -Continue with SSI  AKI with CKD stage IIIb.  Patient is on IV diuretic. -Monitor renal function -Avoid nephrotoxins  Hypokalemia.  Resolved. -Monitor potassium closely while patient is being diuresed.  Objective: Vitals:   12/17/20 1914 12/17/20 2353 12/18/20 0435 12/18/20 0715  BP: 125/71 136/74 (!) 158/86 (!) 168/84  Pulse: 82 79 75 77  Resp: 18 18 18 16   Temp: 98.3 F (36.8 C) 98.1 F (36.7 C) 98 F (36.7 C) 98.1 F (36.7 C)  TempSrc:      SpO2: 96% 91% 97% 92%  Weight:   103.8 kg   Height:        Intake/Output Summary (Last 24 hours) at 12/18/2020 0824 Last data filed at 12/18/2020 0800 Gross per 24 hour  Intake --  Output 875 ml  Net -875 ml    Filed Weights   12/15/20 1137 12/18/20 0435  Weight: 107 kg 103.8 kg    Examination:  General.  Well-developed gentleman, in no acute distress. Pulmonary.  Lungs clear bilaterally, normal respiratory effort. CV.  Regular rate and rhythm, no JVD, rub or murmur. Abdomen.  Soft, nontender, nondistended, BS positive. CNS.  Alert and oriented .  No focal neurologic deficit. Extremities.  No edema, no cyanosis, pulses intact and symmetrical. Psychiatry.  Judgment and insight appears normal.   DVT prophylaxis: Lovenox Code Status: Full  Family Communication:  Disposition Plan:  Status is: Inpatient  Remains inpatient appropriate because: Severity of illness   Level of care: Progressive Cardiac  All the records are reviewed and case discussed with Care Management/Social Worker. Management plans discussed with the patient, nursing and they are in agreement.  Consultants:   Cardiology  Procedures:  Antimicrobials:   Data Reviewed: I have personally reviewed following labs and imaging studies  CBC: Recent Labs  Lab 12/15/20 1139 12/16/20 1353  WBC 7.6 8.7  NEUTROABS 5.0  --   HGB 12.3* 12.7*  HCT 35.5* 36.9*  MCV 92.0 92.7  PLT 313 623    Basic Metabolic Panel: Recent Labs  Lab 12/15/20 1139 12/16/20 1353 12/17/20 0659 12/18/20 0422  NA 142  --  141 137  K 3.1*  --  3.5 3.6  CL 106  --  102 104  CO2 28  --  29 25  GLUCOSE 127*  --  174* 240*  BUN 23  --  30* 30*  CREATININE 2.54* 2.24* 2.81* 2.55*  CALCIUM 8.6*  --  9.1 8.5*    GFR: Estimated Creatinine Clearance: 36.8 mL/min (A) (by C-G formula based on SCr of 2.55 mg/dL (H)). Liver Function Tests: Recent Labs  Lab 12/15/20 1139  AST 18  ALT 14  ALKPHOS 86  BILITOT 0.9  PROT 7.0  ALBUMIN 3.3*    No results for input(s): LIPASE, AMYLASE in the last 168 hours. No results for input(s): AMMONIA in the last 168 hours. Coagulation Profile: No results for input(s): INR, PROTIME in the last 168 hours. Cardiac Enzymes: No results for input(s): CKTOTAL, CKMB, CKMBINDEX, TROPONINI in the last 168 hours. BNP (last 3 results) No results for input(s): PROBNP in the last 8760 hours. HbA1C: No results for input(s): HGBA1C in the last 72 hours. CBG: Recent Labs  Lab 12/17/20 0908 12/17/20 1156 12/17/20 1649 12/17/20 2048 12/18/20 0716  GLUCAP 141* 169* 220* 178* 226*    Lipid Profile: No results for input(s): CHOL, HDL, LDLCALC, TRIG, CHOLHDL, LDLDIRECT in the last 72 hours. Thyroid Function Tests: No results for input(s): TSH, T4TOTAL, FREET4, T3FREE, THYROIDAB in the last 72 hours. Anemia Panel: No results for input(s): VITAMINB12, FOLATE, FERRITIN, TIBC, IRON, RETICCTPCT in the last 72 hours. Sepsis Labs: No results for input(s): PROCALCITON, LATICACIDVEN in the last 168 hours.  Recent Results (from the past 240 hour(s))  Resp Panel by RT-PCR (Flu A&B, Covid)  Nasopharyngeal Swab     Status: None   Collection Time: 12/15/20  9:26 PM   Specimen: Nasopharyngeal Swab; Nasopharyngeal(NP) swabs in vial transport medium  Result Value Ref Range Status   SARS Coronavirus 2 by RT PCR NEGATIVE NEGATIVE Final    Comment: (NOTE) SARS-CoV-2 target nucleic acids are NOT DETECTED.  The SARS-CoV-2 RNA is generally detectable in upper respiratory specimens during the acute phase of infection. The lowest concentration of SARS-CoV-2 viral copies this assay can detect is 138 copies/mL. A negative result does not preclude SARS-Cov-2 infection and should not be used as the sole basis for treatment or other patient management decisions. A negative result may occur with  improper specimen collection/handling, submission of specimen other than nasopharyngeal swab, presence of viral mutation(s) within the areas targeted by this assay, and inadequate number of viral copies(<138 copies/mL). A negative result must be combined with clinical observations, patient history, and epidemiological information. The expected result is Negative.  Fact Sheet for Patients:  EntrepreneurPulse.com.au  Fact Sheet for Healthcare Providers:  IncredibleEmployment.be  This test  is no t yet approved or cleared by the [place] and  has been authorized for detection and/or diagnosis of SARS-CoV-2 by FDA under an Emergency Use Authorization (EUA). This EUA will remain  in effect (meaning this test can be used) for the duration of the COVID-19 declaration under Section 564(b)(1) of the Act, 21 U.S.C.section 360bbb-3(b)(1), unless the authorization is terminated  or revoked sooner.       Influenza A by PCR NEGATIVE NEGATIVE Final   Influenza B by PCR NEGATIVE NEGATIVE Final    Comment: (NOTE) The Xpert Xpress SARS-CoV-2/FLU/RSV plus assay is intended as an aid in the diagnosis of influenza from Nasopharyngeal swab specimens and should not be  used as a sole basis for treatment. Nasal washings and aspirates are unacceptable for Xpert Xpress SARS-CoV-2/FLU/RSV testing.  Fact Sheet for Patients: EntrepreneurPulse.com.au  Fact Sheet for Healthcare Providers: IncredibleEmployment.be  This test is not yet approved or cleared by the [place] FDA and has been authorized for detection and/or diagnosis of SARS-CoV-2 by FDA under an Emergency Use Authorization (EUA). This EUA will remain in effect (meaning this test can be used) for the duration of the COVID-19 declaration under Section 564(b)(1) of the Act, 21 U.S.C. section 360bbb-3(b)(1), unless the authorization is terminated or revoked.  Performed at [facility], [address], [place], [place] [address]       Radiology Studies: ECHOCARDIOGRAM COMPLETE  Result Date: 12/16/2020    ECHOCARDIOGRAM REPORT   [name] Date of Exam: 12/16/2020 Medical Rec #:  [id number]      Height:       71.0 in Accession #:    [id number]     Weight:       236.0 lb Date of Birth:  1960/09/08      BSA:          2.262 m Patient Age:    39 years       BP:           135/72 mmHg Patient Gender: M              HR:           77 bpm. Exam Location:  [facility] Procedure: 2D Echo, Cardiac Doppler and Color Doppler Indications:     CHF-acute diastolic Y18.56  History:         Patient has prior history of Echocardiogram examinations, most                  recent 04/28/2019.  Sonographer:     Sherrie Sport Referring Phys:  [id number] YOVZCHYI AGBATA Diagnosing Phys: Nelva Bush MD  Sonographer Comments: Suboptimal apical window. IMPRESSIONS  1. Left ventricular ejection fraction, by estimation, is 40 to 45%. The left ventricle has mildly decreased function. The left ventricle demonstrates global hypokinesis. There is more severe hypokinesis of the basal inferior/inferolateral segments. There is moderate left ventricular hypertrophy. Left ventricular  diastolic parameters are consistent with Grade I diastolic dysfunction (impaired relaxation).  2. Right ventricular systolic function is normal. The right ventricular size is mildly enlarged. Tricuspid regurgitation signal is inadequate for assessing PA pressure.  3. Left atrial size was mildly dilated.  4. Right atrial size was moderately dilated.  5. The mitral valve is normal in structure. Trivial mitral valve regurgitation. No evidence of mitral stenosis.  6. The aortic valve has an indeterminant number of cusps. Aortic valve regurgitation is not visualized. No aortic stenosis is present. FINDINGS  Left  Ventricle: Left ventricular ejection fraction, by estimation, is 40 to 45%. The left ventricle has mildly decreased function. The left ventricle demonstrates global hypokinesis. There is more severe hypokinesis of the basal inferior/inferolateral segments. The left ventricular internal cavity size was normal in size. There is moderate left ventricular hypertrophy. Left ventricular diastolic parameters are consistent with Grade I diastolic dysfunction (impaired relaxation). Right Ventricle: The right ventricular size is mildly enlarged. No increase in right ventricular wall thickness. Right ventricular systolic function is normal. Tricuspid regurgitation signal is inadequate for assessing PA pressure. Left Atrium: Left atrial size was mildly dilated. Right Atrium: Right atrial size was moderately dilated. Pericardium: The pericardium was not well visualized. Mitral Valve: The mitral valve is normal in structure. Trivial mitral valve regurgitation. No evidence of mitral valve stenosis. Tricuspid Valve: The tricuspid valve is not well visualized. Tricuspid valve regurgitation is trivial. Aortic Valve: The aortic valve has an indeterminant number of cusps. Aortic valve regurgitation is not visualized. No aortic stenosis is present. Aortic valve mean gradient measures 2.0 mmHg. Aortic valve peak gradient measures 3.2  mmHg. Aortic valve area, by VTI measures 2.51 cm. Pulmonic Valve: The pulmonic valve was grossly normal. Pulmonic valve regurgitation is trivial. No evidence of pulmonic stenosis. Aorta: The aortic root is normal in size and structure. Pulmonary Artery: The pulmonary artery is of normal size. Venous: The inferior vena cava was not well visualized. IAS/Shunts: The interatrial septum was not well visualized.  LEFT VENTRICLE PLAX 2D LVIDd:         4.42 cm      Diastology LVIDs:         3.66 cm      LV e' medial:    4.03 cm/s LV PW:         1.02 cm      LV E/e' medial:  11.6 LV IVS:        1.05 cm      LV e' lateral:   5.11 cm/s LVOT diam:     2.10 cm      LV E/e' lateral: 9.1 LV SV:         40 LV SV Index:   18 LVOT Area:     3.46 cm  LV Volumes (MOD) LV vol d, MOD A2C: 123.0 ml LV vol d, MOD A4C: 118.0 ml LV vol s, MOD A2C: 70.7 ml LV vol s, MOD A4C: 63.1 ml LV SV MOD A2C:     52.3 ml LV SV MOD A4C:     118.0 ml LV SV MOD BP:      59.3 ml RIGHT VENTRICLE RV Basal diam:  3.80 cm RV S prime:     12.50 cm/s TAPSE (M-mode): 2.6 cm LEFT ATRIUM             Index        RIGHT ATRIUM           Index LA diam:        4.30 cm 1.90 cm/m   RA Area:     26.50 cm LA Vol (A2C):   76.8 ml 33.95 ml/m  RA Volume:   92.10 ml  40.71 ml/m LA Vol (A4C):   71.7 ml 31.69 ml/m LA Biplane Vol: 74.8 ml 33.06 ml/m  AORTIC VALVE                    PULMONIC VALVE AV Area (Vmax):    2.16 cm     PV Vmax:  0.78 m/s AV Area (Vmean):   2.25 cm     PV Vmean:       49.550 cm/s AV Area (VTI):     2.51 cm     PV VTI:         0.140 m AV Vmax:           88.90 cm/s   PV Peak grad:   2.4 mmHg AV Vmean:          63.400 cm/s  PV Mean grad:   1.0 mmHg AV VTI:            0.159 m      RVOT Peak grad: 4 mmHg AV Peak Grad:      3.2 mmHg AV Mean Grad:      2.0 mmHg LVOT Vmax:         55.50 cm/s LVOT Vmean:        41.200 cm/s LVOT VTI:          0.115 m LVOT/AV VTI ratio: 0.72  AORTA Ao Root diam: 3.07 cm MITRAL VALVE MV Area (PHT): 5.23 cm    SHUNTS MV  Decel Time: 145 msec    Systemic VTI:  0.12 m MV E velocity: 46.70 cm/s  Systemic Diam: 2.10 cm MV A velocity: 70.30 cm/s  Pulmonic VTI:  0.172 m MV E/A ratio:  0.66 Christopher End MD Electronically signed by Nelva Bush MD Signature Date/Time: 12/16/2020/4:08:56 PM    Final     Scheduled Meds:  amLODipine  10 mg Oral Daily   aspirin EC  81 mg Oral Daily   atorvastatin  20 mg Oral Daily   carvedilol  25 mg Oral BID WC   enoxaparin (LOVENOX) injection  0.5 mg/kg Subcutaneous Q24H   furosemide  40 mg Intravenous Daily   hydrALAZINE  25 mg Oral Q8H   insulin aspart  0-15 Units Subcutaneous TID WC   isosorbide mononitrate  120 mg Oral Daily   pantoprazole  40 mg Oral Daily   potassium chloride  40 mEq Oral Daily   sodium chloride flush  3 mL Intravenous Q12H   Continuous Infusions:  sodium chloride       LOS: 2 days   Time spent: 38 minutes. More than 50% of the time was spent in counseling/coordination of care  Lorella Nimrod, MD [facility]  If 7PM-7AM, please contact night-coverage [email or web address]  12/18/2020, 8:24 AM   This record has been created using Systems analyst. Errors have been sought and corrected,but may not always be located. Such creation errors do not reflect on the standard of care.

## 2020-12-18 NOTE — Progress Notes (Signed)
Inpatient Diabetes Program Recommendations  AACE/ADA: New Consensus Statement on Inpatient Glycemic Control (2015)  Target Ranges:  Prepandial:   less than 140 mg/dL      Peak postprandial:   less than 180 mg/dL (1-2 hours)      Critically ill patients:  140 - 180 mg/dL   Lab Results  Component Value Date   GLUCAP 205 (H) 12/18/2020   HGBA1C 14.7 (A) 11/28/2020    Review of Glycemic Control Results for STAFFORD, RIVIERA (MRN [id number]) as of 12/18/2020 13:16  Ref. Range 12/17/2020 11:56 12/17/2020 16:49 12/17/2020 20:48 12/18/2020 07:16 12/18/2020 11:07  Glucose-Capillary Latest Ref Range: 70 - 99 mg/dL 169 (H) 220 (H) 178 (H) 226 (H) 205 (H)   Diabetes history: DM 2 Outpatient Diabetes medications:  Lantus 50 units daily, Humalog SSI, Jardiance 25 mg daily, Ozempic 1 mg weekly Current orders for Inpatient glycemic control:  Novolog moderate tid with meals Semglee 10 units bid  Inpatient Diabetes Program Recommendations:    Agree with current orders.  Interestingly blood sugars have been pretty well controlled up until today. A1C was high at last MD visit.  Met with patient at bedside.  He states he has been doing a better job of monitoring and taking his insulin as ordered.  He sometimes has lows in the mornings, but states it seems to be when he takes Humalog to cover a "soda".  Recommended that he not do that and we reviewed hypoglycemia symptoms and treatment. We reviewed importance of glycemic control and I congratulated him on working so hard to control blood sugars. Will need f/u with PCP and recheck of A1C in January 2023.   Thanks,   Adah Perl, RN, BC-ADM Inpatient Diabetes Coordinator Pager [phone]  (8a-5p)

## 2020-12-19 DIAGNOSIS — I420 Dilated cardiomyopathy: Secondary | ICD-10-CM

## 2020-12-19 LAB — BASIC METABOLIC PANEL
Anion gap: 8 (ref 5–15)
BUN: 34 mg/dL — ABNORMAL HIGH (ref 8–23)
CO2: 26 mmol/L (ref 22–32)
Calcium: 8.8 mg/dL — ABNORMAL LOW (ref 8.9–10.3)
Chloride: 104 mmol/L (ref 98–111)
Creatinine, Ser: 2.65 mg/dL — ABNORMAL HIGH (ref 0.61–1.24)
GFR, Estimated: 26 mL/min — ABNORMAL LOW (ref >60)
Glucose, Bld: 217 mg/dL — ABNORMAL HIGH (ref 70–99)
Potassium: 3.7 mmol/L (ref 3.5–5.1)
Sodium: 138 mmol/L (ref 135–145)

## 2020-12-19 LAB — GLUCOSE, CAPILLARY
Glucose-Capillary: 198 mg/dL — ABNORMAL HIGH (ref 70–99)
Glucose-Capillary: 279 mg/dL — ABNORMAL HIGH (ref 70–99)

## 2020-12-19 MED ORDER — TORSEMIDE 20 MG PO TABS: 20.0000 mg | ORAL_TABLET | Freq: Every day | ORAL | Status: DC

## 2020-12-19 MED ORDER — TORSEMIDE 20 MG PO TABS: 20.0000 mg | ORAL_TABLET | Freq: Every day | ORAL | 2 refills | Status: DC

## 2020-12-19 MED ORDER — ATORVASTATIN CALCIUM 20 MG PO TABS: 20.0000 mg | ORAL_TABLET | Freq: Every day | ORAL | 1 refills | Status: DC

## 2020-12-19 MED ORDER — CARVEDILOL 25 MG PO TABS: 25.0000 mg | ORAL_TABLET | Freq: Two times a day (BID) | ORAL | 1 refills | Status: DC

## 2020-12-19 MED ORDER — AMLODIPINE BESYLATE 10 MG PO TABS: 10.0000 mg | ORAL_TABLET | Freq: Every day | ORAL | 1 refills | Status: DC

## 2020-12-19 MED ORDER — ISOSORBIDE MONONITRATE ER 60 MG PO TB24: 60.0000 mg | ORAL_TABLET | Freq: Two times a day (BID) | ORAL | 1 refills | Status: DC

## 2020-12-19 MED ORDER — ISOSORBIDE MONONITRATE ER 60 MG PO TB24: 60.0000 mg | ORAL_TABLET | Freq: Two times a day (BID) | ORAL | Status: DC

## 2020-12-19 NOTE — Progress Notes (Signed)
Progress Note  Patient Name: Roberto Allen Date of Encounter: 12/19/2020  Primary Cardiologist: Croitoru  Subjective   No chest pain or dyspnea.  Documented UOP 1.6 L for the past 24 hours, net - 6.4 L for the admission.  Weight documented at 103.8 to 104 kg kg this morning which is consistent with his last outpatient clinic weight in 08/2020.  Renal function trending up, though consistent with prior readings.   Inpatient Medications    Scheduled Meds:  amLODipine  10 mg Oral Daily   aspirin EC  81 mg Oral Daily   atorvastatin  20 mg Oral Daily   carvedilol  25 mg Oral BID WC   enoxaparin (LOVENOX) injection  0.5 mg/kg Subcutaneous Q24H   hydrALAZINE  50 mg Oral Q8H   insulin aspart  0-15 Units Subcutaneous TID WC   insulin glargine-yfgn  10 Units Subcutaneous BID   isosorbide mononitrate  120 mg Oral Daily   pantoprazole  40 mg Oral Daily   potassium chloride  40 mEq Oral Daily   sodium chloride flush  3 mL Intravenous Q12H   Continuous Infusions:  sodium chloride     PRN Meds: sodium chloride, acetaminophen, ondansetron (ZOFRAN) IV, sodium chloride flush   Vital Signs    Vitals:   12/19/20 0001 12/19/20 0320 12/19/20 0643 12/19/20 0719  BP: 128/75 137/82 (!) 152/70 (!) 143/84  Pulse: 85 82  81  Resp: 16 18 18 16   Temp: 98.4 F (36.9 C) 98 F (36.7 C)  98.5 F (36.9 C)  TempSrc: Oral Oral    SpO2: 90% 95%  93%  Weight: 104 kg     Height:        Intake/Output Summary (Last 24 hours) at 12/19/2020 0902 Last data filed at 12/19/2020 0335 Gross per 24 hour  Intake 240 ml  Output 1025 ml  Net -785 ml   Filed Weights   12/15/20 1137 12/18/20 0435 12/19/20 0001  Weight: 107 kg 103.8 kg 104 kg    Telemetry    SR with rare PVCs - Personally Reviewed  ECG    No new tracings - Personally Reviewed  Physical Exam   GEN: No acute distress.   Neck: No JVD. Cardiac: RRR, I/VI systolic murmur, no rubs, or gallops.  Respiratory: Clear to auscultation  bilaterally.  GI: Soft, nontender, non-distended.   MS: Trace lower extremity edema; No deformity. Neuro:  Alert and oriented x 3; Nonfocal.  Psych: Normal affect.  Labs    Chemistry Recent Labs  Lab 12/15/20 1139 12/16/20 1353 12/17/20 0659 12/18/20 0422 12/19/20 0415  NA 142  --  141 137 138  K 3.1*  --  3.5 3.6 3.7  CL 106  --  102 104 104  CO2 28  --  29 25 26   GLUCOSE 127*  --  174* 240* 217*  BUN 23  --  30* 30* 34*  CREATININE 2.54*   < > 2.81* 2.55* 2.65*  CALCIUM 8.6*  --  9.1 8.5* 8.8*  PROT 7.0  --   --   --   --   ALBUMIN 3.3*  --   --   --   --   AST 18  --   --   --   --   ALT 14  --   --   --   --   ALKPHOS 86  --   --   --   --   BILITOT 0.9  --   --   --   --  GFRNONAA 28*   < > 25* 28* 26*  ANIONGAP 8  --  10 8 8    < > = values in this interval not displayed.      Hematology Recent Labs  Lab 12/15/20 1139 12/16/20 1353  WBC 7.6 8.7  RBC 3.86* 3.98*  HGB 12.3* 12.7*  HCT 35.5* 36.9*  MCV 92.0 92.7  MCH 31.9 31.9  MCHC 34.6 34.4  RDW 14.2 13.9  PLT 313 317     Cardiac EnzymesNo results for input(s): TROPONINI in the last 168 hours. No results for input(s): TROPIPOC in the last 168 hours.   BNP Recent Labs  Lab 12/15/20 1139  BNP 501.5*      DDimer No results for input(s): DDIMER in the last 168 hours.   Radiology    CXR 12/15/2020: IMPRESSION: Increased bilateral effusions and bibasilar airspace disease. While this may represent atelectasis, infection is not excluded.  Cardiac Studies   Lexiscan MPI 12/18/2020: Pharmacological myocardial perfusion imaging study with no significant  ischemia Global hypokinesis , EF estimated at 33% No EKG changes concerning for ischemia at peak stress or in recovery. CT attenuation correction images with proximal/mid LAD calcification, distal RCA, no significant aortic atherosclerosis Low risk scan __________  2D echo 12/16/2020: 1. Left ventricular ejection fraction, by estimation, is  40 to 45%. The  left ventricle has mildly decreased function. The left ventricle  demonstrates global hypokinesis. There is more severe hypokinesis of the  basal inferior/inferolateral segments.  There is moderate left ventricular hypertrophy. Left ventricular diastolic  parameters are consistent with Grade I diastolic dysfunction (impaired  relaxation).   2. Right ventricular systolic function is normal. The right ventricular  size is mildly enlarged. Tricuspid regurgitation signal is inadequate for  assessing PA pressure.   3. Left atrial size was mildly dilated.   4. Right atrial size was moderately dilated.   5. The mitral valve is normal in structure. Trivial mitral valve  regurgitation. No evidence of mitral stenosis.   6. The aortic valve has an indeterminant number of cusps. Aortic valve  regurgitation is not visualized. No aortic stenosis is present. __________  2D echo 04/28/2019:  1. Left ventricular ejection fraction, by estimation, is 50 to 55%. The  left ventricle has low normal function. The left ventricle demonstrates  regional wall motion abnormalities (see scoring diagram/findings for  description). There is mild left  ventricular hypertrophy. Left ventricular diastolic parameters are  indeterminate.   2. Right ventricular systolic function is normal. The right ventricular  size is normal. Tricuspid regurgitation signal is inadequate for assessing  PA pressure.   3. The mitral valve is normal in structure. No evidence of mitral valve  regurgitation. No evidence of mitral stenosis.   4. The aortic valve is normal in structure. Aortic valve regurgitation is  trivial. No aortic stenosis is present.   5. The inferior vena cava is normal in size with greater than 50%  respiratory variability, suggesting right atrial pressure of 3 mmHg.  __________  Carlton Adam MPI 12/2012: IMPRESSION:  Normal myocardial perfusion examination. No evidence for  pharmacological induced  ischemia.   Calculated ejection fraction is 59% with normal wall motion.  Patient Profile     60 y.o. male with history of HFrEF with recovery of EF, nonobstructive CAD by LHC in 2013, CKD stage IIIb, DM2, HTN, HLD, COPD, GERD, and OSA who is being seen for evaluation of acute on chronic HFrEF at the request of Dr. Francine Graven.  Assessment & Plan  1.  Acute on chronic HFrEF secondary to NICM: -Possibly hypertensive heart disease, as he reports a long history of significant hypertension  -Volume status improved  -Ideally, with underlying cardiomyopathy, would favor discontinuation of amlodipine, though with persistently elevated BP and in the context of renal dysfunction, options are limited -ARB was stopped due to renal dysfunction -Unable to add ACEi/ARB/ARNI/MRA/SGLT2i at this time with CKD -Continue Coreg and hydralazine  -Transition Imdur 120 mg daily to 60 mg bid in an effort to minimize drops in MP in the morning -Start torsemide 20 mg daily tomorrow with an extra 20 mg prn for weight gain > 3 pounds overnight  -CHF education -Recommend daily standing scale weights and strict I's/O's  2.  Nonobstructive CAD with minimally elevated high-sensitivity troponin: -No chest pain -Minimally elevated and flat trending high-sensitivity troponin not consistent with ACS and likely secondary to supply demand ischemia with known nonobstructive CAD, volume overload, and underlying CKD -Lexiscan MPI low risk -Continue aspirin, atorvastatin, carvedilol, and Imdur -Aggressive resector modification  3. CKD stage IIIb: -Stable  4. HTN: -Blood pressure reasonably controlled at this time -Medications as above   Ok for discharge from cardiac perspective on current medications. I will coordinate follow up in our office.   For questions or updates, please contact [place] Please consult [email or web address] for contact info under Cardiology/STEMI.    Signed, Christell Faith, PA-C [place] Pager: [phone] 12/19/2020, 9:02 AM

## 2020-12-19 NOTE — TOC Initial Note (Signed)
Transition of Care [facility]) - Initial/Assessment Note    Patient Details  Name: Roberto Allen MRN: [id number] Date of Birth: 01-24-1961  [facility]) CM/SW Contact:    Eileen Stanford, LCSW Phone Number: 12/19/2020, 9:39 AM  Clinical Narrative:    Pt lives with spouse. Pt states himself or his wife takes him to his doctor appointment. Pt state he gets his meds from [place] on [address]. Pt confirmed he still sees Dr Glori Bickers for primary care. Pt was not getting any services at home prior to admission. Pt doesn't believe he will need any services after d/c.               Expected Discharge Plan: Home/Self Care Barriers to Discharge: Continued Medical Work up   Patient Goals and CMS Choice Patient states their goals for this hospitalization and ongoing recovery are:: to go home   Choice offered to / list presented to : NA  Expected Discharge Plan and Services Expected Discharge Plan: Home/Self Care In-house Referral: Clinical Social Work   Post Acute Care Choice: NA Living arrangements for the past 2 months: Single Family Home                                      Prior Living Arrangements/Services Living arrangements for the past 2 months: Single Family Home Lives with:: Spouse Patient language and need for interpreter reviewed:: Yes Do you feel safe going back to the place where you live?: Yes      Need for Family Participation in Patient Care: Yes (Comment) Care giver support system in place?: Yes (comment)   Criminal Activity/Legal Involvement Pertinent to Current Situation/Hospitalization: No - Comment as needed  Activities of Daily Living Home Assistive Devices/Equipment: None ADL Screening (condition at time of admission) Patient's cognitive ability adequate to safely complete daily activities?: Yes Is the patient deaf or have difficulty hearing?: No Does the patient have difficulty seeing, even when wearing glasses/contacts?: No Does the patient have  difficulty concentrating, remembering, or making decisions?: No Patient able to express need for assistance with ADLs?: No Does the patient have difficulty dressing or bathing?: No Independently performs ADLs?: Yes (appropriate for developmental age) Does the patient have difficulty walking or climbing stairs?: No Weakness of Legs: None Weakness of Arms/Hands: None  Permission Sought/Granted Permission sought to share information with : Family Supports Permission granted to share information with : Yes, Release of Information Signed  Share Information with NAME: lynette     Permission granted to share info w Relationship: spouse     Emotional Assessment Appearance:: Appears stated age Attitude/Demeanor/Rapport: Engaged Affect (typically observed): Accepting Orientation: : Oriented to Self, Oriented to Place, Oriented to  Time, Oriented to Situation Alcohol / Substance Use: Not Applicable Psych Involvement: No (comment)  Admission diagnosis:  CHF exacerbation ([place]) [I50.9] Acute on chronic congestive heart failure, unspecified heart failure type [facility]) [I50.9] Patient Active Problem List   Diagnosis Date Noted   CHF exacerbation ([place]) 12/16/2020   Acute on chronic diastolic CHF (congestive heart failure) ([place]) 12/16/2020   Hypertensive urgency 12/16/2020   Hypokalemia 12/16/2020   CHF (congestive heart failure) ([place]) 04/28/2019   Hypertension secondary to other renal disorders 11/29/2016   GERD (gastroesophageal reflux disease) 07/23/2016   Degenerative disc disease at L5-S1 level 04/09/2016   Primary osteoarthritis of right hip 04/09/2016   Obesity, Class I, BMI 30-34.9 02/06/2016   Ulcer of  left foot ([place]) 01/06/2015   Microalbuminuria due to type 2 diabetes mellitus ([place]) 10/03/2014   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 diabetes mellitus ([place]) 06/26/2014   NICM (nonischemic cardiomyopathy) ([place]) 09/06/2013   ED (erectile dysfunction) 03/27/2013   Disorder of kidney and ureter  12/17/2012   DM (diabetes mellitus), type 2 with renal complications ([place]) 72/15/8727   Coronary Artery Disease (non-obstructive by cath 05/2011) 06/14/2011   Hyperlipidemia associated with type 2 diabetes mellitus ([place]) 06/14/2011   OSA (obstructive sleep apnea) 05/25/2011   Hypertension    PCP:  Abner Greenspan, MD Pharmacy:   [id number] [place] [address] [place] [address] Phone: [phone] Fax: [phone]  [facility] [id number] [place], [place] - [address] [place] [address] Phone: [phone] Fax: [phone]     Social Determinants of Health ([place]) Interventions    Readmission Risk Interventions Readmission Risk Prevention Plan 04/30/2019  Transportation Screening Complete  PCP or Specialist Appt within 3-5 Days Complete  HRI or Icard Complete  Social Work Consult for [place] Planning/Counseling Complete  Palliative Care Screening Not Applicable  Medication Review Press photographer) Complete  Some recent data might be hidden

## 2020-12-19 NOTE — Progress Notes (Signed)
Order to discharge pt home.  Discharge instructions/AVS given to patient and reviewed - education provided as needed.  Pt advised to call PCP and/or come back to the hospital if there are any problems. Pt verbalized understanding.    

## 2020-12-19 NOTE — Discharge Summary (Signed)
Physician Discharge Summary  Roberto Allen WUJ:[id number] DOB: Jun 22, 1960 DOA: 12/16/2020  PCP: Abner Greenspan, MD  Admit date: 12/16/2020 Discharge date: 12/19/2020  Admitted From: Home Disposition: Home  Recommendations for Outpatient Follow-up:  Follow up with PCP in 1-2 weeks Follow-up with cardiology in 1 to 2 weeks Please obtain BMP/CBC in one week Please follow up on the following pending results: None  Home Health: No Equipment/Devices: None Discharge Condition: Stable CODE STATUS: Full Diet recommendation: Heart Healthy / Carb Modified   Brief/Interim Summary:  Roberto Allen is a 60 y.o. male with medical history significant for CAD, chronic diastolic dysfunction CHF, insulin-dependent diabetes mellitus with complications of stage III chronic kidney disease, COPD, GERD, hyperlipidemia, hypertension who presented to the emergency room for evaluation of worsening shortness of breath and lower extremity swelling.  Due to his symptoms he increase his Lasix from 20 mg to 40 mg daily without any significant improvement in his symptoms.  He has a cough productive of clear phlegm but denies having any fever or chills. Chest x-ray with increased bilateral pleural effusions and bibasilar airspace disease. Admitted for acute on chronic HFpEF.  Prior echocardiogram with EF of 50 to 55%.  Repeat echocardiogram with reduced EF of 40 to 45%.  Cardiology was also consulted.  Myoview was done in order to avoid contrast with cardiac catheterization and it was a low risk study.  Cardiology made some changes to his existing medications and he was advised to start taking the new medication according to the new list and follow-up with cardiology closely for further recommendations.  His home dose of Lasix was switched with torsemide, Imdur dose was changed, amlodipine and hydralazine was added for better control of blood pressure as his blood pressure remained very high.  Metoprolol was switched to  carvedilol.  Patient had mild AKI with CKD stage IIIb.  Creatinine currently stable.  He will continue with his medications according to the new medication list and follow-up with his providers closely for further recommendations.  Discharge Diagnoses:  Principal Problem:   Acute on chronic diastolic CHF (congestive heart failure) (HCC) Active Problems:   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 diabetes mellitus (HCC)   GERD (gastroesophageal reflux disease)   Hypertensive urgency   Hypokalemia   Discharge Instructions  Discharge Instructions     Diet - low sodium heart healthy   Complete by: As directed    Discharge instructions   Complete by: As directed    It was pleasure taking care of you. Your cardiologist made some changes to your medications, please start taking according to new recommendations. Follow-up with your cardiologist closely.   Increase activity slowly   Complete by: As directed       Allergies as of 12/19/2020       Reactions   Metformin And Related Other (See Comments)   Due to CHF and CRI        Medication List     STOP taking these medications    aMILoride 5 MG tablet Commonly known as: MIDAMOR   doxycycline 100 MG tablet Commonly known as: VIBRA-TABS   furosemide 20 MG tablet Commonly known as: Lasix   metoprolol succinate 100 MG 24 hr tablet Commonly known as: TOPROL-XL   silver sulfADIAZINE 1 % cream Commonly known as: Silvadene       TAKE these medications    Accu-Chek FastClix Lancets Misc USE TO MONITOR BLOOD GLUCOSE 3 TIME(S) DAILY   amLODipine 10 MG tablet Commonly known as:  NORVASC Take 1 tablet (10 mg total) by mouth daily. Start taking on: December 20, 2020 What changed:  medication strength how much to take   aspirin 81 MG EC tablet Take 1 tablet (81 mg total) by mouth daily.   atorvastatin 20 MG tablet Commonly known as: LIPITOR Take 1 tablet (20 mg total) by mouth daily. Start taking on: December 20, 2020 What  changed:  medication strength how much to take   BD Pen Needle Nano U/F 32G X 4 MM Misc Generic drug: Insulin Pen Needle Inject 1 Units into the skin as needed.   carvedilol 25 MG tablet Commonly known as: COREG Take 1 tablet (25 mg total) by mouth 2 (two) times daily with a meal.   ergocalciferol 1.25 MG (50000 UT) capsule Commonly known as: VITAMIN D2 Take by mouth.   HumaLOG KwikPen 100 UNIT/ML KwikPen Generic drug: insulin lispro SMARTSIG:15 Unit(s) SUB-Q 3 Times Daily PRN   hydrALAZINE 25 MG tablet Commonly known as: APRESOLINE Take 1 tablet (25 mg total) by mouth 3 (three) times daily.   isosorbide mononitrate 60 MG 24 hr tablet Commonly known as: IMDUR Take 1 tablet (60 mg total) by mouth 2 (two) times daily. Start taking on: December 20, 2020 What changed:  medication strength how much to take when to take this   Jardiance 25 MG Tabs tablet Generic drug: empagliflozin TAKE 1 TABLET (25 MG TOTAL) BY MOUTH DAILY.   Lantus SoloStar 100 UNIT/ML Solostar Pen Generic drug: insulin glargine Inject 50 Units into the skin at bedtime.   losartan 50 MG tablet Commonly known as: COZAAR TAKE 1 TABLET BY MOUTH EVERY DAY   magnesium oxide 400 (241.3 Mg) MG tablet Commonly known as: MAG-OX Take 1 tablet (400 mg total) by mouth daily.   mupirocin ointment 2 % Commonly known as: BACTROBAN Apply 1 application topically daily.   omeprazole 20 MG capsule Commonly known as: PRILOSEC TAKE 1 CAPSULE BY MOUTH TWICE A DAY BEFORE A MEAL   OneTouch Verio Reflect w/Device Kit 1 Units by Does not apply route as needed.   OneTouch Verio test strip Generic drug: glucose blood 1 each by Other route as needed for other (3-4 times per day). Use as instructed   glucose blood test strip To check glucose tid and prn for poorly controlled diabetes   Ozempic (1 MG/DOSE) 4 MG/3ML Sopn Generic drug: Semaglutide (1 MG/DOSE) INJECT 1 MG UNDER SKIN ONCE A WEEK   potassium chloride SA  20 MEQ tablet Commonly known as: KLOR-CON Take 1 tablet (20 mEq total) by mouth 2 (two) times daily.   tadalafil 20 MG tablet Commonly known as: CIALIS TAKE ONE TABLET BY MOUTH DAILY AS NEEDED FOR ERECTILE DYSFUNCTION   torsemide 20 MG tablet Commonly known as: DEMADEX Take 1 tablet (20 mg total) by mouth daily. Start taking on: December 20, 2020   Vitamin D3 50 MCG (2000 UT) Tabs Take 1 tablet by mouth once a week. What changed: Another medication with the same name was removed. Continue taking this medication, and follow the directions you see here.        Follow-up Information     Tower, Wynelle Fanny, MD. Schedule an appointment as soon as possible for a visit.   Specialties: Family Medicine, Radiology Contact information: [address] [place] [address] [phone]         Croitoru, Dani Gobble, MD. Schedule an appointment as soon as possible for a visit in 1 week(s).   Specialty: Cardiology  Contact information: [address] [place] [address] [phone]                Allergies  Allergen Reactions   Metformin And Related Other (See Comments)    Due to CHF and CRI    Consultations: Cardiology  Procedures/Studies: DG Chest 2 View  Result Date: 12/15/2020 CLINICAL DATA:  Shortness of breath. EXAM: CHEST - 2 VIEW COMPARISON:  Two-view chest x-ray 07/29/2020 FINDINGS: The heart size is normal. Mild pulmonary vascular congestion is now present. Increased bilateral effusions and bibasilar airspace disease is noted. IMPRESSION: Increased bilateral effusions and bibasilar airspace disease. While this may represent atelectasis, infection is not excluded. Electronically Signed   By: San Morelle M.D.   On: 12/15/2020 12:10   NM Myocar Multi W/Spect W/Wall Motion / EF  Result Date: 12/18/2020 Pharmacological myocardial perfusion imaging study with no significant  ischemia Global hypokinesis , EF estimated at 33% No EKG changes  concerning for ischemia at peak stress or in recovery. CT attenuation correction images with proximal/mid LAD calcification, distal RCA, no significant aortic atherosclerosis Low risk scan Signed, Esmond Plants, MD, Ph.D [facility]   ECHOCARDIOGRAM COMPLETE  Result Date: 12/16/2020    ECHOCARDIOGRAM REPORT   [name] Date of Exam: 12/16/2020 Medical Rec #:  [id number]      Height:       71.0 in Accession #:    [id number]     Weight:       236.0 lb Date of Birth:  October 19, 1960      BSA:          2.262 m Patient Age:    45 years       BP:           135/72 mmHg Patient Gender: M              HR:           77 bpm. Exam Location:  [facility] Procedure: 2D Echo, Cardiac Doppler and Color Doppler Indications:     CHF-acute diastolic Y65.03  History:         Patient has prior history of Echocardiogram examinations, most                  recent 04/28/2019.  Sonographer:     Sherrie Sport Referring Phys:  [id number] LEXNTZGY AGBATA Diagnosing Phys: Nelva Bush MD  Sonographer Comments: Suboptimal apical window. IMPRESSIONS  1. Left ventricular ejection fraction, by estimation, is 40 to 45%. The left ventricle has mildly decreased function. The left ventricle demonstrates global hypokinesis. There is more severe hypokinesis of the basal inferior/inferolateral segments. There is moderate left ventricular hypertrophy. Left ventricular diastolic parameters are consistent with Grade I diastolic dysfunction (impaired relaxation).  2. Right ventricular systolic function is normal. The right ventricular size is mildly enlarged. Tricuspid regurgitation signal is inadequate for assessing PA pressure.  3. Left atrial size was mildly dilated.  4. Right atrial size was moderately dilated.  5. The mitral valve is normal in structure. Trivial mitral valve regurgitation. No evidence of mitral stenosis.  6. The aortic valve has an indeterminant number of cusps. Aortic valve regurgitation is not visualized. No aortic stenosis is  present. FINDINGS  Left Ventricle: Left ventricular ejection fraction, by estimation, is 40 to 45%. The left ventricle has mildly decreased function. The left ventricle demonstrates global hypokinesis. There is more severe hypokinesis of the basal inferior/inferolateral segments. The left ventricular internal cavity size  was normal in size. There is moderate left ventricular hypertrophy. Left ventricular diastolic parameters are consistent with Grade I diastolic dysfunction (impaired relaxation). Right Ventricle: The right ventricular size is mildly enlarged. No increase in right ventricular wall thickness. Right ventricular systolic function is normal. Tricuspid regurgitation signal is inadequate for assessing PA pressure. Left Atrium: Left atrial size was mildly dilated. Right Atrium: Right atrial size was moderately dilated. Pericardium: The pericardium was not well visualized. Mitral Valve: The mitral valve is normal in structure. Trivial mitral valve regurgitation. No evidence of mitral valve stenosis. Tricuspid Valve: The tricuspid valve is not well visualized. Tricuspid valve regurgitation is trivial. Aortic Valve: The aortic valve has an indeterminant number of cusps. Aortic valve regurgitation is not visualized. No aortic stenosis is present. Aortic valve mean gradient measures 2.0 mmHg. Aortic valve peak gradient measures 3.2 mmHg. Aortic valve area, by VTI measures 2.51 cm. Pulmonic Valve: The pulmonic valve was grossly normal. Pulmonic valve regurgitation is trivial. No evidence of pulmonic stenosis. Aorta: The aortic root is normal in size and structure. Pulmonary Artery: The pulmonary artery is of normal size. Venous: The inferior vena cava was not well visualized. IAS/Shunts: The interatrial septum was not well visualized.  LEFT VENTRICLE PLAX 2D LVIDd:         4.42 cm      Diastology LVIDs:         3.66 cm      LV e' medial:    4.03 cm/s LV PW:         1.02 cm      LV E/e' medial:  11.6 LV IVS:         1.05 cm      LV e' lateral:   5.11 cm/s LVOT diam:     2.10 cm      LV E/e' lateral: 9.1 LV SV:         40 LV SV Index:   18 LVOT Area:     3.46 cm  LV Volumes (MOD) LV vol d, MOD A2C: 123.0 ml LV vol d, MOD A4C: 118.0 ml LV vol s, MOD A2C: 70.7 ml LV vol s, MOD A4C: 63.1 ml LV SV MOD A2C:     52.3 ml LV SV MOD A4C:     118.0 ml LV SV MOD BP:      59.3 ml RIGHT VENTRICLE RV Basal diam:  3.80 cm RV S prime:     12.50 cm/s TAPSE (M-mode): 2.6 cm LEFT ATRIUM             Index        RIGHT ATRIUM           Index LA diam:        4.30 cm 1.90 cm/m   RA Area:     26.50 cm LA Vol (A2C):   76.8 ml 33.95 ml/m  RA Volume:   92.10 ml  40.71 ml/m LA Vol (A4C):   71.7 ml 31.69 ml/m LA Biplane Vol: 74.8 ml 33.06 ml/m  AORTIC VALVE                    PULMONIC VALVE AV Area (Vmax):    2.16 cm     PV Vmax:        0.78 m/s AV Area (Vmean):   2.25 cm     PV Vmean:       49.550 cm/s AV Area (VTI):     2.51 cm  PV VTI:         0.140 m AV Vmax:           88.90 cm/s   PV Peak grad:   2.4 mmHg AV Vmean:          63.400 cm/s  PV Mean grad:   1.0 mmHg AV VTI:            0.159 m      RVOT Peak grad: 4 mmHg AV Peak Grad:      3.2 mmHg AV Mean Grad:      2.0 mmHg LVOT Vmax:         55.50 cm/s LVOT Vmean:        41.200 cm/s LVOT VTI:          0.115 m LVOT/AV VTI ratio: 0.72  AORTA Ao Root diam: 3.07 cm MITRAL VALVE MV Area (PHT): 5.23 cm    SHUNTS MV Decel Time: 145 msec    Systemic VTI:  0.12 m MV E velocity: 46.70 cm/s  Systemic Diam: 2.10 cm MV A velocity: 70.30 cm/s  Pulmonic VTI:  0.172 m MV E/A ratio:  0.66 Harrell Gave End MD Electronically signed by Nelva Bush MD Signature Date/Time: 12/16/2020/4:08:56 PM    Final     Subjective: Patient was seen and examined today.  Lying comfortably in bed.  No new complaints.  Ready to go home.  No chest pain or shortness of breath.  Discharge Exam: Vitals:   12/19/20 0719 12/19/20 1109  BP: (!) 143/84 128/78  Pulse: 81 71  Resp: 16 17  Temp: 98.5 F (36.9 C) 98.4 F  (36.9 C)  SpO2: 93% 93%   Vitals:   12/19/20 0320 12/19/20 0643 12/19/20 0719 12/19/20 1109  BP: 137/82 (!) 152/70 (!) 143/84 128/78  Pulse: 82  81 71  Resp: _0 Temp: 98 F (36.7 C)  98.5 F (36.9 C) 98.4 F (36.9 C)  TempSrc: Oral     SpO2: 95%  93% 93%  Weight:      Height:        General: Pt is alert, awake, not in acute distress Cardiovascular: RRR, S1/S2 +, no rubs, no gallops Respiratory: CTA bilaterally, no wheezing, no rhonchi Abdominal: Soft, NT, ND, bowel sounds + Extremities: no edema, no cyanosis   The results of significant diagnostics from this hospitalization (including imaging, microbiology, ancillary and laboratory) are listed below for reference.    Microbiology: Recent Results (from the past 240 hour(s))  Resp Panel by RT-PCR (Flu A&B, Covid) Nasopharyngeal Swab     Status: None   Collection Time: 12/15/20  9:26 PM   Specimen: Nasopharyngeal Swab; Nasopharyngeal(NP) swabs in vial transport medium  Result Value Ref Range Status   SARS Coronavirus 2 by RT PCR NEGATIVE NEGATIVE Final    Comment: (NOTE) SARS-CoV-2 target nucleic acids are NOT DETECTED.  The SARS-CoV-2 RNA is generally detectable in upper respiratory specimens during the acute phase of infection. The lowest concentration of SARS-CoV-2 viral copies this assay can detect is 138 copies/mL. A negative result does not preclude SARS-Cov-2 infection and should not be used as the sole basis for treatment or other patient management decisions. A negative result may occur with  improper specimen collection/handling, submission of specimen other than nasopharyngeal swab, presence of viral mutation(s) within the areas targeted by this assay, and inadequate number of viral copies(<138 copies/mL). A negative result must be combined with clinical observations, patient history, and epidemiological information. The expected result  is Negative.  Fact Sheet for Patients:   EntrepreneurPulse.com.au  Fact Sheet for Healthcare Providers:  IncredibleEmployment.be  This test is no t yet approved or cleared by the [place] FDA and  has been authorized for detection and/or diagnosis of SARS-CoV-2 by FDA under an Emergency Use Authorization (EUA). This EUA will remain  in effect (meaning this test can be used) for the duration of the COVID-19 declaration under Section 564(b)(1) of the Act, 21 U.S.C.section 360bbb-3(b)(1), unless the authorization is terminated  or revoked sooner.       Influenza A by PCR NEGATIVE NEGATIVE Final   Influenza B by PCR NEGATIVE NEGATIVE Final    Comment: (NOTE) The Xpert Xpress SARS-CoV-2/FLU/RSV plus assay is intended as an aid in the diagnosis of influenza from Nasopharyngeal swab specimens and should not be used as a sole basis for treatment. Nasal washings and aspirates are unacceptable for Xpert Xpress SARS-CoV-2/FLU/RSV testing.  Fact Sheet for Patients: EntrepreneurPulse.com.au  Fact Sheet for Healthcare Providers: IncredibleEmployment.be  This test is not yet approved or cleared by the [place] FDA and has been authorized for detection and/or diagnosis of SARS-CoV-2 by FDA under an Emergency Use Authorization (EUA). This EUA will remain in effect (meaning this test can be used) for the duration of the COVID-19 declaration under Section 564(b)(1) of the Act, 21 U.S.C. section 360bbb-3(b)(1), unless the authorization is terminated or revoked.  Performed at [facility], [address] [place], [place] [address]      Labs: BNP (last 3 results) Recent Labs    12/15/20 1139  BNP 628.6*   Basic Metabolic Panel: Recent Labs  Lab 12/15/20 1139 12/16/20 1353 12/17/20 0659 12/18/20 0422 12/19/20 0415  NA 142  --  141 137 138  K 3.1*  --  3.5 3.6 3.7  CL 106  --  102 104 104  CO2 28  --  _0 GLUCOSE 127*  --   174* 240* 217*  BUN 23  --  30* 30* 34*  CREATININE 2.54* 2.24* 2.81* 2.55* 2.65*  CALCIUM 8.6*  --  9.1 8.5* 8.8*   Liver Function Tests: Recent Labs  Lab 12/15/20 1139  AST 18  ALT 14  ALKPHOS 86  BILITOT 0.9  PROT 7.0  ALBUMIN 3.3*   No results for input(s): LIPASE, AMYLASE in the last 168 hours. No results for input(s): AMMONIA in the last 168 hours. CBC: Recent Labs  Lab 12/15/20 1139 12/16/20 1353  WBC 7.6 8.7  NEUTROABS 5.0  --   HGB 12.3* 12.7*  HCT 35.5* 36.9*  MCV 92.0 92.7  PLT 313 317   Cardiac Enzymes: No results for input(s): CKTOTAL, CKMB, CKMBINDEX, TROPONINI in the last 168 hours. BNP: Invalid input(s): POCBNP CBG: Recent Labs  Lab 12/18/20 1107 12/18/20 1519 12/18/20 2032 12/19/20 0720 12/19/20 1110  GLUCAP 205* 203* 214* 198* 279*   D-Dimer No results for input(s): DDIMER in the last 72 hours. Hgb A1c No results for input(s): HGBA1C in the last 72 hours. Lipid Profile No results for input(s): CHOL, HDL, LDLCALC, TRIG, CHOLHDL, LDLDIRECT in the last 72 hours. Thyroid function studies No results for input(s): TSH, T4TOTAL, T3FREE, THYROIDAB in the last 72 hours.  Invalid input(s): FREET3 Anemia work up No results for input(s): VITAMINB12, FOLATE, FERRITIN, TIBC, IRON, RETICCTPCT in the last 72 hours. Urinalysis    Component Value Date/Time   COLORURINE STRAW (A) 02/02/2020 2247   APPEARANCEUR CLEAR 02/02/2020 2247   LABSPEC 1.026 02/02/2020 2247  PHURINE 5.0 02/02/2020 2247   GLUCOSEU >=500 (A) 02/02/2020 2247   HGBUR SMALL (A) 02/02/2020 2247   [address] NEGATIVE 02/02/2020 2247   [place] 02/02/2020 2247   PROTEINUR 100 (A) 02/02/2020 2247   NITRITE NEGATIVE 02/02/2020 2247   LEUKOCYTESUR NEGATIVE 02/02/2020 2247   Sepsis Labs Invalid input(s): PROCALCITONIN,  WBC,  LACTICIDVEN Microbiology Recent Results (from the past 240 hour(s))  Resp Panel by RT-PCR (Flu A&B, Covid) Nasopharyngeal Swab     Status: None    Collection Time: 12/15/20  9:26 PM   Specimen: Nasopharyngeal Swab; Nasopharyngeal(NP) swabs in vial transport medium  Result Value Ref Range Status   SARS Coronavirus 2 by RT PCR NEGATIVE NEGATIVE Final    Comment: (NOTE) SARS-CoV-2 target nucleic acids are NOT DETECTED.  The SARS-CoV-2 RNA is generally detectable in upper respiratory specimens during the acute phase of infection. The lowest concentration of SARS-CoV-2 viral copies this assay can detect is 138 copies/mL. A negative result does not preclude SARS-Cov-2 infection and should not be used as the sole basis for treatment or other patient management decisions. A negative result may occur with  improper specimen collection/handling, submission of specimen other than nasopharyngeal swab, presence of viral mutation(s) within the areas targeted by this assay, and inadequate number of viral copies(<138 copies/mL). A negative result must be combined with clinical observations, patient history, and epidemiological information. The expected result is Negative.  Fact Sheet for Patients:  EntrepreneurPulse.com.au  Fact Sheet for Healthcare Providers:  IncredibleEmployment.be  This test is no t yet approved or cleared by the [place] FDA and  has been authorized for detection and/or diagnosis of SARS-CoV-2 by FDA under an Emergency Use Authorization (EUA). This EUA will remain  in effect (meaning this test can be used) for the duration of the COVID-19 declaration under Section 564(b)(1) of the Act, 21 U.S.C.section 360bbb-3(b)(1), unless the authorization is terminated  or revoked sooner.       Influenza A by PCR NEGATIVE NEGATIVE Final   Influenza B by PCR NEGATIVE NEGATIVE Final    Comment: (NOTE) The Xpert Xpress SARS-CoV-2/FLU/RSV plus assay is intended as an aid in the diagnosis of influenza from Nasopharyngeal swab specimens and should not be used as a sole basis for treatment.  Nasal washings and aspirates are unacceptable for Xpert Xpress SARS-CoV-2/FLU/RSV testing.  Fact Sheet for Patients: EntrepreneurPulse.com.au  Fact Sheet for Healthcare Providers: IncredibleEmployment.be  This test is not yet approved or cleared by the [place] FDA and has been authorized for detection and/or diagnosis of SARS-CoV-2 by FDA under an Emergency Use Authorization (EUA). This EUA will remain in effect (meaning this test can be used) for the duration of the COVID-19 declaration under Section 564(b)(1) of the Act, 21 U.S.C. section 360bbb-3(b)(1), unless the authorization is terminated or revoked.  Performed at [facility], [address] [place], [place] [address]     Time coordinating discharge: Over 30 minutes  SIGNED:  Lorella Nimrod, MD  [facility] 12/19/2020, 11:54 AM  If 7PM-7AM, please contact night-coverage [email or web address]  This record has been created using Systems analyst. Errors have been sought and corrected,but may not always be located. Such creation errors do not reflect on the standard of care.

## 2020-12-19 NOTE — Plan of Care (Signed)

## 2020-12-22 ENCOUNTER — Encounter: Payer: Self-pay | Admitting: Family Medicine

## 2020-12-22 ENCOUNTER — Ambulatory Visit (INDEPENDENT_AMBULATORY_CARE_PROVIDER_SITE_OTHER): Payer: 59 | Admitting: Family Medicine

## 2020-12-22 ENCOUNTER — Other Ambulatory Visit: Payer: Self-pay

## 2020-12-22 VITALS — BP 134/82 | HR 86 | Temp 98.0°F | Ht 71.0 in | Wt 239.0 lb

## 2020-12-22 DIAGNOSIS — E1122 Type 2 diabetes mellitus with diabetic chronic kidney disease: Secondary | ICD-10-CM

## 2020-12-22 DIAGNOSIS — N183 Chronic kidney disease, stage 3 unspecified: Secondary | ICD-10-CM | POA: Diagnosis not present

## 2020-12-22 DIAGNOSIS — I10 Essential (primary) hypertension: Secondary | ICD-10-CM

## 2020-12-22 DIAGNOSIS — K219 Gastro-esophageal reflux disease without esophagitis: Secondary | ICD-10-CM | POA: Diagnosis not present

## 2020-12-22 DIAGNOSIS — I509 Heart failure, unspecified: Secondary | ICD-10-CM | POA: Diagnosis not present

## 2020-12-22 DIAGNOSIS — Z794 Long term (current) use of insulin: Secondary | ICD-10-CM

## 2020-12-22 DIAGNOSIS — N1832 Chronic kidney disease, stage 3b: Secondary | ICD-10-CM

## 2020-12-22 LAB — BASIC METABOLIC PANEL
BUN: 37 mg/dL — ABNORMAL HIGH (ref 6–23)
CO2: 29 mEq/L (ref 19–32)
Calcium: 9.3 mg/dL (ref 8.4–10.5)
Chloride: 103 mEq/L (ref 96–112)
Creatinine, Ser: 3.19 mg/dL — ABNORMAL HIGH (ref 0.40–1.50)
GFR: 20.08 mL/min — ABNORMAL LOW (ref >60.00)
Glucose, Bld: 127 mg/dL — ABNORMAL HIGH (ref 70–99)
Potassium: 4.3 mEq/L (ref 3.5–5.1)
Sodium: 140 mEq/L (ref 135–145)

## 2020-12-22 MED ORDER — OMEPRAZOLE 20 MG PO CPDR: 20.0000 mg | DELAYED_RELEASE_CAPSULE | Freq: Every day | ORAL | 3 refills | Status: DC

## 2020-12-22 MED ORDER — GLUCOSE BLOOD VI STRP: ORAL_STRIP | 5 refills | Status: DC

## 2020-12-22 NOTE — Progress Notes (Signed)
Subjective:    Patient ID: Roberto Allen, male    DOB: April 16, 1960, 60 y.o.   MRN: [id number]  This visit occurred during the SARS-CoV-2 public health emergency.  Safety protocols were in place, including screening questions prior to the visit, additional usage of staff PPE, and extensive cleaning of exam room while observing appropriate contact time as indicated for disinfecting solutions.   HPI Pt presents for f/u of hospitalization   Wt Readings from Last 3 Encounters:  12/22/20 239 lb (108.4 kg)  12/19/20 229 lb 3.2 oz (104 kg)  11/28/20 225 lb (102.1 kg)   33.33 kg/m   He was hospitalized from 11/1 to 12/19/20 for acute CHF  This got worse and his bp got worse after a trip (ate poorly, perhaps ate too much salt)  Feeling a lot better now   He presented on day of admit with worsening sob and LE edema  He did inc lasix to 40 mg daily at home w/o significant improvement  CXR showed bilat pl eff and bibasilar airspace dz  Echocardiogram- EF 40-45% (reduced from prior) Myoview was done  Home lasix changed to torsemide Imdur dose was changed and amlodipine and hydralazine added, metoprolol was changed to carvedilol  (in the setting of high BP, hypertensive urgency)  DG Chest 2 View  Result Date: 12/15/2020 CLINICAL DATA:  Shortness of breath. EXAM: CHEST - 2 VIEW COMPARISON:  Two-view chest x-ray 07/29/2020 FINDINGS: The heart size is normal. Mild pulmonary vascular congestion is now present. Increased bilateral effusions and bibasilar airspace disease is noted. IMPRESSION: Increased bilateral effusions and bibasilar airspace disease. While this may represent atelectasis, infection is not excluded. Electronically Signed   By: San Morelle M.D.   On: 12/15/2020 12:10   NM Myocar Multi W/Spect W/Wall Motion / EF  Result Date: 12/18/2020 Pharmacological myocardial perfusion imaging study with no significant  ischemia Global hypokinesis , EF estimated at 33% No EKG changes  concerning for ischemia at peak stress or in recovery. CT attenuation correction images with proximal/mid LAD calcification, distal RCA, no significant aortic atherosclerosis Low risk scan Signed, Esmond Plants, MD, Ph.D [facility]   ECHOCARDIOGRAM COMPLETE  Result Date: 12/16/2020    ECHOCARDIOGRAM REPORT   [name] Date of Exam: 12/16/2020 Medical Rec #:  [id number]      Height:       71.0 in Accession #:    [id number]     Weight:       236.0 lb Date of Birth:  12-Aug-1960      BSA:          2.262 m Patient Age:    72 years       BP:           135/72 mmHg Patient Gender: M              HR:           77 bpm. Exam Location:  [facility] Procedure: 2D Echo, Cardiac Doppler and Color Doppler Indications:     CHF-acute diastolic L24.40  History:         Patient has prior history of Echocardiogram examinations, most                  recent 04/28/2019.  Sonographer:     Sherrie Sport Referring Phys:  [id number] AGBATA Diagnosing Phys: Nelva Bush MD  Sonographer Comments: Suboptimal apical window. IMPRESSIONS  1. Left ventricular ejection fraction, by estimation, is 40  to 45%. The left ventricle has mildly decreased function. The left ventricle demonstrates global hypokinesis. There is more severe hypokinesis of the basal inferior/inferolateral segments. There is moderate left ventricular hypertrophy. Left ventricular diastolic parameters are consistent with Grade I diastolic dysfunction (impaired relaxation).  2. Right ventricular systolic function is normal. The right ventricular size is mildly enlarged. Tricuspid regurgitation signal is inadequate for assessing PA pressure.  3. Left atrial size was mildly dilated.  4. Right atrial size was moderately dilated.  5. The mitral valve is normal in structure. Trivial mitral valve regurgitation. No evidence of mitral stenosis.  6. The aortic valve has an indeterminant number of cusps. Aortic valve regurgitation is not visualized. No aortic stenosis is  present. FINDINGS  Left Ventricle: Left ventricular ejection fraction, by estimation, is 40 to 45%. The left ventricle has mildly decreased function. The left ventricle demonstrates global hypokinesis. There is more severe hypokinesis of the basal inferior/inferolateral segments. The left ventricular internal cavity size was normal in size. There is moderate left ventricular hypertrophy. Left ventricular diastolic parameters are consistent with Grade I diastolic dysfunction (impaired relaxation). Right Ventricle: The right ventricular size is mildly enlarged. No increase in right ventricular wall thickness. Right ventricular systolic function is normal. Tricuspid regurgitation signal is inadequate for assessing PA pressure. Left Atrium: Left atrial size was mildly dilated. Right Atrium: Right atrial size was moderately dilated. Pericardium: The pericardium was not well visualized. Mitral Valve: The mitral valve is normal in structure. Trivial mitral valve regurgitation. No evidence of mitral valve stenosis. Tricuspid Valve: The tricuspid valve is not well visualized. Tricuspid valve regurgitation is trivial. Aortic Valve: The aortic valve has an indeterminant number of cusps. Aortic valve regurgitation is not visualized. No aortic stenosis is present. Aortic valve mean gradient measures 2.0 mmHg. Aortic valve peak gradient measures 3.2 mmHg. Aortic valve area, by VTI measures 2.51 cm. Pulmonic Valve: The pulmonic valve was grossly normal. Pulmonic valve regurgitation is trivial. No evidence of pulmonic stenosis. Aorta: The aortic root is normal in size and structure. Pulmonary Artery: The pulmonary artery is of normal size. Venous: The inferior vena cava was not well visualized. IAS/Shunts: The interatrial septum was not well visualized.  LEFT VENTRICLE PLAX 2D LVIDd:         4.42 cm      Diastology LVIDs:         3.66 cm      LV e' medial:    4.03 cm/s LV PW:         1.02 cm      LV E/e' medial:  11.6 LV IVS:         1.05 cm      LV e' lateral:   5.11 cm/s LVOT diam:     2.10 cm      LV E/e' lateral: 9.1 LV SV:         40 LV SV Index:   18 LVOT Area:     3.46 cm  LV Volumes (MOD) LV vol d, MOD A2C: 123.0 ml LV vol d, MOD A4C: 118.0 ml LV vol s, MOD A2C: 70.7 ml LV vol s, MOD A4C: 63.1 ml LV SV MOD A2C:     52.3 ml LV SV MOD A4C:     118.0 ml LV SV MOD BP:      59.3 ml RIGHT VENTRICLE RV Basal diam:  3.80 cm RV S prime:     12.50 cm/s TAPSE (M-mode): 2.6 cm LEFT ATRIUM  Index        RIGHT ATRIUM           Index LA diam:        4.30 cm 1.90 cm/m   RA Area:     26.50 cm LA Vol (A2C):   76.8 ml 33.95 ml/m  RA Volume:   92.10 ml  40.71 ml/m LA Vol (A4C):   71.7 ml 31.69 ml/m LA Biplane Vol: 74.8 ml 33.06 ml/m  AORTIC VALVE                    PULMONIC VALVE AV Area (Vmax):    2.16 cm     PV Vmax:        0.78 m/s AV Area (Vmean):   2.25 cm     PV Vmean:       49.550 cm/s AV Area (VTI):     2.51 cm     PV VTI:         0.140 m AV Vmax:           88.90 cm/s   PV Peak grad:   2.4 mmHg AV Vmean:          63.400 cm/s  PV Mean grad:   1.0 mmHg AV VTI:            0.159 m      RVOT Peak grad: 4 mmHg AV Peak Grad:      3.2 mmHg AV Mean Grad:      2.0 mmHg LVOT Vmax:         55.50 cm/s LVOT Vmean:        41.200 cm/s LVOT VTI:          0.115 m LVOT/AV VTI ratio: 0.72  AORTA Ao Root diam: 3.07 cm MITRAL VALVE MV Area (PHT): 5.23 cm    SHUNTS MV Decel Time: 145 msec    Systemic VTI:  0.12 m MV E velocity: 46.70 cm/s  Systemic Diam: 2.10 cm MV A velocity: 70.30 cm/s  Pulmonic VTI:  0.172 m MV E/A ratio:  0.66 Christopher End MD Electronically signed by Nelva Bush MD Signature Date/Time: 12/16/2020/4:08:56 PM    Final      Cardiology f/u planned    Most recent labs  Lab Results  Component Value Date   CREATININE 2.65 (H) 12/19/2020   BUN 34 (H) 12/19/2020   NA 138 12/19/2020   K 3.7 12/19/2020   CL 104 12/19/2020   CO2 26 12/19/2020   H/o renal insufficiency   Ca level 8.8  Lab Results  Component Value  Date   TROPONINI <0.30 12/17/2012     Lab Results  Component Value Date   WBC 8.7 12/16/2020   HGB 12.7 (L) 12/16/2020   HCT 36.9 (L) 12/16/2020   MCV 92.7 12/16/2020   PLT 317 12/16/2020    HTN bp is stable today (improved)-had gone up after last visit here  No cp or palpitations or headaches or edema  No side effects to medicines  BP Readings from Last 3 Encounters:  12/22/20 134/82  12/19/20 128/78  11/28/20 132/86     Amlodipine 10 mg daily  Torsemide 20 mg daily  Carvedilol 25 mg bid  Hydralazine 25 mg tid  Imdur 60 mg bid  Losartan 50 mg daily   He is keeping up on medication /keeps a pill box   Takes asa 81 mg daily   DM2 Lab Results  Component Value Date   HGBA1C 14.7 (A)  11/28/2020  Referred to endocrinology at last visit   Cardiology f/u scheduled for 11/11 with NP [facility] 11/15 with PA Dunn 12/15 with PA Sarajane Jews  Feeling better now  Just a little sob with exertion /much better than it was  Swelling improving day by day   He is eating better  Blood sugar has been under 200, much improved  Fluid restriction is 64 oz -he is careful about that   Patient Active Problem List   Diagnosis Date Noted   Dilated cardiomyopathy ([place]) 12/19/2020   CHF exacerbation ([place]) 12/16/2020   Acute on chronic diastolic CHF (congestive heart failure) ([place]) 12/16/2020   Hypertensive urgency 12/16/2020   Hypokalemia 12/16/2020   CHF (congestive heart failure) ([place]) 04/28/2019   Hypertension secondary to other renal disorders 11/29/2016   GERD (gastroesophageal reflux disease) 07/23/2016   Degenerative disc disease at L5-S1 level 04/09/2016   Primary osteoarthritis of right hip 04/09/2016   Obesity, Class I, BMI 30-34.9 02/06/2016   Ulcer of left foot ([place]) 01/06/2015   Microalbuminuria due to type 2 diabetes mellitus ([place]) 10/03/2014   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 diabetes mellitus ([place]) 06/26/2014   NICM (nonischemic cardiomyopathy) ([place]) 09/06/2013   ED (erectile  dysfunction) 03/27/2013   Disorder of kidney and ureter 12/17/2012   DM (diabetes mellitus), type 2 with renal complications ([place]) 40/97/3532   Coronary Artery Disease (non-obstructive by cath 05/2011) 06/14/2011   Hyperlipidemia associated with type 2 diabetes mellitus ([place]) 06/14/2011   OSA (obstructive sleep apnea) 05/25/2011   Hypertension    Past Medical History:  Diagnosis Date   CAD (coronary artery disease)    a. LHC 4/13: mD1 25%, oD2 25%, pOM1 25%, pRCA 25%, dRCA 40%, EF 30%   Chronic kidney disease (CKD), stage III (moderate) (HCC)    Chronic systolic heart failure (HCC)    a. EF 30-35% in 04/2011, improved to 55-60% in 01/2012.   COPD (chronic obstructive pulmonary disease) (HCC)    Diabetes mellitus    GERD (gastroesophageal reflux disease)    History of pneumonia    Hyperkalemia    a. 12/2012 - spironolactone, KCl supp discontinued.   Hyperlipidemia    Hypertension    Hypertriglyceridemia    a. 12/2012 - started on fenofibrate.   NICM (nonischemic cardiomyopathy) ([place])    a. Echo 3/13: mild LVH, EF 30-35%, grade 3 diast dysfxn, mod LAE;  RHC 4/13:  RA 8, RV 58/10, PA 56/18, mean 35, PCWP mean 17, LV 154/27, CO 4.3, CI 2.0. b. EF Improved >>> Echo (01/2012):  Mild LVH, EF 55-60%, no RWMA, Gr 1 DD   Past Surgical History:  Procedure Laterality Date   CARDIAC CATHETERIZATION  2013   non-obs dz 25-40% multiple vessels   TENOTOMY  2018   Social History   Tobacco Use   Smoking status: Former    Packs/day: 1.00    Years: 20.00    Pack years: 20.00    Types: Cigarettes    Quit date: 09/15/2006    Years since quitting: 14.2   Smokeless tobacco: Never  Vaping Use   Vaping Use: Never used  Substance Use Topics   Alcohol use: Not Currently    Comment: occ   Drug use: No   Family History  Problem Relation Age of Onset   Diabetes Sister    Breast cancer Mother    Breast cancer Maternal Grandmother    Allergies  Allergen Reactions   Metformin And Related Other  (See Comments)  Due to CHF and CRI   Current Outpatient Medications on File Prior to Visit  Medication Sig Dispense Refill   ACCU-CHEK FASTCLIX LANCETS MISC USE TO MONITOR BLOOD GLUCOSE 3 TIME(S) DAILY  5   amLODipine (NORVASC) 10 MG tablet Take 1 tablet (10 mg total) by mouth daily. 30 tablet 1   aspirin EC 81 MG EC tablet Take 1 tablet (81 mg total) by mouth daily.     atorvastatin (LIPITOR) 20 MG tablet Take 1 tablet (20 mg total) by mouth daily. 30 tablet 1   BD PEN NEEDLE NANO U/F 32G X 4 MM MISC Inject 1 Units into the skin as needed. 100 each 3   Blood Glucose Monitoring Suppl (ONETOUCH VERIO REFLECT) w/Device KIT 1 Units by Does not apply route as needed. 1 kit 0   carvedilol (COREG) 25 MG tablet Take 1 tablet (25 mg total) by mouth 2 (two) times daily with a meal. 60 tablet 1   Cholecalciferol (VITAMIN D3) 50 MCG (2000 UT) TABS Take 1 tablet by mouth once a week.     ergocalciferol (VITAMIN D2) 1.25 MG (50000 UT) capsule Take by mouth.     glucose blood (ONETOUCH VERIO) test strip 1 each by Other route as needed for other (3-4 times per day). Use as instructed 100 each 11   HUMALOG KWIKPEN 100 UNIT/ML KwikPen SMARTSIG:15 Unit(s) SUB-Q 3 Times Daily PRN     hydrALAZINE (APRESOLINE) 25 MG tablet Take 1 tablet (25 mg total) by mouth 3 (three) times daily. 270 tablet 3   isosorbide mononitrate (IMDUR) 60 MG 24 hr tablet Take 1 tablet (60 mg total) by mouth 2 (two) times daily. 60 tablet 1   JARDIANCE 25 MG TABS tablet TAKE 1 TABLET (25 MG TOTAL) BY MOUTH DAILY. 90 tablet 0   LANTUS SOLOSTAR 100 UNIT/ML Solostar Pen Inject 50 Units into the skin at bedtime. 15 mL 5   losartan (COZAAR) 50 MG tablet TAKE 1 TABLET BY MOUTH EVERY DAY (Patient taking differently: Take 50 mg by mouth daily.) 30 tablet 1   magnesium oxide (MAG-OX) 400 (241.3 Mg) MG tablet Take 1 tablet (400 mg total) by mouth daily. 90 tablet 3   mupirocin ointment (BACTROBAN) 2 % Apply 1 application topically daily. 30 g 2    OZEMPIC, 1 MG/DOSE, 4 MG/3ML SOPN INJECT 1 MG UNDER SKIN ONCE A WEEK 3 mL 0   potassium chloride SA (KLOR-CON) 20 MEQ tablet Take 1 tablet (20 mEq total) by mouth 2 (two) times daily. 10 tablet 0   tadalafil (CIALIS) 20 MG tablet TAKE ONE TABLET BY MOUTH DAILY AS NEEDED FOR ERECTILE DYSFUNCTION 30 tablet 5   torsemide (DEMADEX) 20 MG tablet Take 1 tablet (20 mg total) by mouth daily. 30 tablet 2   [DISCONTINUED] potassium chloride (KLOR-CON) 20 MEQ packet Take 20 mEq by mouth 2 (two) times daily.     No current facility-administered medications on file prior to visit.     Review of Systems  Constitutional:  Positive for fatigue. Negative for activity change, appetite change, fever and unexpected weight change.  HENT:  Negative for congestion, rhinorrhea, sore throat and trouble swallowing.   Eyes:  Negative for pain, redness, itching and visual disturbance.  Respiratory:  Positive for shortness of breath. Negative for cough, chest tightness, wheezing and stridor.   Cardiovascular:  Positive for leg swelling. Negative for chest pain and palpitations.  Gastrointestinal:  Negative for abdominal pain, blood in stool, constipation, diarrhea and nausea.  Endocrine: Negative  for cold intolerance, heat intolerance, polydipsia and polyuria.  Genitourinary:  Negative for difficulty urinating, dysuria, frequency and urgency.  Musculoskeletal:  Negative for arthralgias, joint swelling and myalgias.  Skin:  Negative for pallor and rash.  Neurological:  Negative for dizziness, tremors, weakness, numbness and headaches.  Hematological:  Negative for adenopathy. Does not bruise/bleed easily.  Psychiatric/Behavioral:  Negative for decreased concentration and dysphoric mood. The patient is not nervous/anxious.       Objective:   Physical Exam Constitutional:      General: He is not in acute distress.    Appearance: Normal appearance. He is well-developed. He is obese. He is not ill-appearing or  diaphoretic.  HENT:     Head: Normocephalic and atraumatic.     Mouth/Throat:     Mouth: Mucous membranes are moist.  Eyes:     Conjunctiva/sclera: Conjunctivae normal.     Pupils: Pupils are equal, round, and reactive to light.  Neck:     Thyroid: No thyromegaly.     Vascular: No carotid bruit or JVD.  Cardiovascular:     Rate and Rhythm: Normal rate and regular rhythm.     Heart sounds: Normal heart sounds.    No gallop.  Pulmonary:     Effort: Pulmonary effort is normal. No respiratory distress.     Breath sounds: Normal breath sounds. No stridor. No wheezing, rhonchi or rales.     Comments: No crackles  Abdominal:     General: Bowel sounds are normal. There is no distension or abdominal bruit.     Palpations: Abdomen is soft. There is no mass.     Tenderness: There is no abdominal tenderness.  Musculoskeletal:     Cervical back: Normal range of motion and neck supple.     Right lower leg: Edema present.     Left lower leg: Edema present.     Comments: One plus pitting edema   Lymphadenopathy:     Cervical: No cervical adenopathy.  Skin:    General: Skin is warm and dry.     Coloration: Skin is not pale.     Findings: No rash.  Neurological:     Mental Status: He is alert.     Coordination: Coordination normal.     Deep Tendon Reflexes: Reflexes are normal and symmetric. Reflexes normal.  Psychiatric:        Mood and Affect: Mood normal.          Assessment & Plan:   Problem List Items Addressed This Visit       Cardiovascular and Mediastinum   Hypertension    Recent hypertensive urgency and CHF exacerbation   Reviewed hospital records, lab results and studies in detail  BP: 134/82  Plan to continue (until cardiology visit) Amlodipine 10 mg daily  Torsemide 20 mg daily  Carvedilol 25 mg bid  Hydralazine 25 mg tid  Imdur 60 mg bid  Losartan 50 mg daily       CHF exacerbation (HCC) - Primary    Recent hospitalization  Reviewed hospital records, lab  results and studies in detail   Now bp is improved as well as edema and sob Torsemide works better than lasix in setting of renal dz Pt is eating better and complying with fluid restriction  F/u with cardiology planned 12/26/20  Urged to continue daily weights         Digestive   GERD (gastroesophageal reflux disease)    Urged to wean omeprazole to once daily if able  Will update       Relevant Medications   omeprazole (PRILOSEC) 20 MG capsule     Endocrine   DM (diabetes mellitus), type 2 with renal complications (HCC)    Pt is now watching diet   Planning endocrine f/u  Glucose readings are down  Refilled test strips today      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 diabetes mellitus ([place])    Now on torsemide for CHF Sees nephrology  Lab today s/p hospitalization for chf      Relevant Orders   Basic metabolic panel

## 2020-12-22 NOTE — Assessment & Plan Note (Signed)
Recent hypertensive urgency and CHF exacerbation   Reviewed hospital records, lab results and studies in detail  BP: 134/82  Plan to continue (until cardiology visit) Amlodipine 10 mg daily  Torsemide 20 mg daily  Carvedilol 25 mg bid  Hydralazine 25 mg tid  Imdur 60 mg bid  Losartan 50 mg daily

## 2020-12-22 NOTE — Assessment & Plan Note (Signed)
Recent hospitalization  Reviewed hospital records, lab results and studies in detail   Now bp is improved as well as edema and sob Torsemide works better than lasix in setting of renal dz Pt is eating better and complying with fluid restriction  F/u with cardiology planned 12/26/20  Urged to continue daily weights

## 2020-12-22 NOTE — Assessment & Plan Note (Signed)
Now on torsemide for CHF Sees nephrology  Lab today s/p hospitalization for chf

## 2020-12-22 NOTE — Patient Instructions (Addendum)
Try going down on omeprazole to once daily (that is better for kidneys) If problems let me know   If worse shortness of breath or other symptoms , let cardiology know and then [place]  Take care of yourself  Stick to the fluid restriction  Avoid salty and processed foods  Labs today

## 2020-12-22 NOTE — Assessment & Plan Note (Signed)
Pt is now watching diet   Planning endocrine f/u  Glucose readings are down  Refilled test strips today

## 2020-12-22 NOTE — Assessment & Plan Note (Signed)
Urged to wean omeprazole to once daily if able  Will update

## 2020-12-23 ENCOUNTER — Telehealth: Payer: Self-pay

## 2020-12-23 ENCOUNTER — Encounter: Payer: Self-pay | Admitting: Family Medicine

## 2020-12-23 DIAGNOSIS — E1142 Type 2 diabetes mellitus with diabetic polyneuropathy: Secondary | ICD-10-CM

## 2020-12-23 NOTE — Telephone Encounter (Signed)
Transition Care Management Follow-up Telephone Call Date of discharge and from where: 12/19/2020 / [facility] How have you been since you were released from the hospital? "Feel better, still have a little shortness of breath."  Any questions or concerns? No  Items Reviewed: Did the pt receive and understand the discharge instructions provided? Yes  Medications obtained and verified? Yes  Other? No  Any new allergies since your discharge? No  Dietary orders reviewed? Yes Low salt and 64oz fluid restriction Do you have support at home? Yes   Home Care and Equipment/Supplies: Were home health services ordered? not applicable If so, what is the name of the agency? N/a  Has the agency set up a time to come to the patient's home? not applicable Were any new equipment or medical supplies ordered?  No What is the name of the medical supply agency? No Were you able to get the supplies/equipment? not applicable Do you have any questions related to the use of the equipment or supplies? No  Functional Questionnaire: (I = Independent and D = Dependent) ADLs: I  Bathing/Dressing- I  Meal Prep- I  Eating- I  Maintaining continence- I  Transferring/Ambulation-   Managing Meds- I  Follow up appointments reviewed:  PCP Hospital f/u appt confirmed? Yes  Scheduled to see Dr. Loura Pardon on 12/22/2020 @ 12:00 pm. [facility] f/u appt confirmed? Yes  Scheduled to see Cardiologist ( patient states provider is new and he is not sure of the name.  Not home at the time of call to look at paperwork.)  on 12/30/2020 @ unsure of time. Patient states has his first appointment with heart failure clinic on 12/26/2020 at 8:30am Are transportation arrangements needed? No  If their condition worsens, is the pt aware to call PCP or go to the Emergency Dept.? Yes Was the patient provided with contact information for the PCP's office or ED? Yes Was to pt encouraged to call back with questions or concerns? Yes    Quinn Plowman RN,BSN,CCM RN Case Manager [facility] [phone]

## 2020-12-24 MED ORDER — ONETOUCH VERIO VI STRP: ORAL_STRIP | 5 refills | Status: AC

## 2020-12-25 NOTE — Progress Notes (Signed)
Patient ID: Roberto Allen, male    DOB: 18-May-1960, 60 y.o.   MRN: [id number]  HPI  Roberto Allen is a 60 y/o male with a history of CAD, DM, hyperlipidemia, HTN, CKD, COPD, GERD, hyperkalemia, previous tobacco use and chronic heart failure.   Echo report from 12/16/20 reviewed and showed an EF of 40-45% along with moderate LVH,mild LAE and trivial Roberto.   Admitted 12/16/20 due to shortness of breath and pedal edema. Cardiology consult obtained. Medications adjusted. Discharged after 3 days.   He presents today for his initial visit with a chief complaint of minimal shortness of breath upon moderate exertion. He describes this as having been present for several months but has improved since his recent admission. He has associated fatigue, pedal edema, abdominal distention and chronic difficulty sleeping. He denies any dizziness, palpitations, chest pain, cough or weight gain.   Is wanting to transition all his care to the [facility] / [place] area. Has upcoming cardiology appointment scheduled for next week.   Has seen nephrology in the past but says that it's been "awhile"  Past Medical History:  Diagnosis Date   CAD (coronary artery disease)    a. LHC 4/13: mD1 25%, oD2 25%, pOM1 25%, pRCA 25%, dRCA 40%, EF 30%   CHF (congestive heart failure) (HCC)    Chronic kidney disease (CKD), stage III (moderate) (HCC)    Chronic systolic heart failure (HCC)    a. EF 30-35% in 04/2011, improved to 55-60% in 01/2012.   COPD (chronic obstructive pulmonary disease) (HCC)    Diabetes mellitus    GERD (gastroesophageal reflux disease)    History of pneumonia    Hyperkalemia    a. 12/2012 - spironolactone, KCl supp discontinued.   Hyperlipidemia    Hypertension    Hypertriglyceridemia    a. 12/2012 - started on fenofibrate.   NICM (nonischemic cardiomyopathy) ([place])    a. Echo 3/13: mild LVH, EF 30-35%, grade 3 diast dysfxn, mod LAE;  RHC 4/13:  RA 8, RV 58/10, PA 56/18, mean 35, PCWP mean 17, LV  154/27, CO 4.3, CI 2.0. b. EF Improved >>> Echo (01/2012):  Mild LVH, EF 55-60%, no RWMA, Gr 1 DD   Past Surgical History:  Procedure Laterality Date   CARDIAC CATHETERIZATION  2013   non-obs dz 25-40% multiple vessels   TENOTOMY  2018   Family History  Problem Relation Age of Onset   Diabetes Sister    Breast cancer Mother    Breast cancer Maternal Grandmother    Social History   Tobacco Use   Smoking status: Former    Packs/day: 1.00    Years: 20.00    Pack years: 20.00    Types: Cigarettes    Quit date: 09/15/2006    Years since quitting: 14.2   Smokeless tobacco: Never  Substance Use Topics   Alcohol use: Not Currently    Comment: occ   Allergies  Allergen Reactions   Metformin And Related Other (See Comments)    Due to CHF and CRI   Prior to Admission medications   Medication Sig Start Date End Date Taking? Authorizing Provider  ACCU-CHEK FASTCLIX LANCETS MISC USE TO MONITOR BLOOD GLUCOSE 3 TIME(S) DAILY 05/05/17  Yes [provider]  amLODipine (NORVASC) 10 MG tablet Take 1 tablet (10 mg total) by mouth daily. 12/20/20  Yes Lorella Nimrod, MD  aspirin EC 81 MG EC tablet Take 1 tablet (81 mg total) by mouth daily. 05/02/19  Yes Geradine Girt,  DO  atorvastatin (LIPITOR) 20 MG tablet Take 1 tablet (20 mg total) by mouth daily. 12/20/20  Yes Lorella Nimrod, MD  BD PEN NEEDLE NANO U/F 32G X 4 MM MISC Inject 1 Units into the skin as needed. 11/05/19  Yes Elby Beck, FNP  Blood Glucose Monitoring Suppl (ONETOUCH VERIO REFLECT) w/Device KIT 1 Units by Does not apply route as needed. 11/28/20  Yes Tower, Wynelle Fanny, MD  carvedilol (COREG) 25 MG tablet Take 1 tablet (25 mg total) by mouth 2 (two) times daily with a meal. 12/19/20  Yes Lorella Nimrod, MD  Cholecalciferol (VITAMIN D3) 50 MCG (2000 UT) TABS Take 1 tablet by mouth once a week. 07/25/19  Yes [provider]  ergocalciferol (VITAMIN D2) 1.25 MG (50000 UT) capsule Take by mouth.   Yes [provider]  glucose blood (ONETOUCH VERIO) test strip Use to check blood sugar 2 times a day 12/24/20  Yes Tower, Wynelle Fanny, MD  HUMALOG KWIKPEN 100 UNIT/ML KwikPen SMARTSIG:15 Unit(s) SUB-Q 3 Times Daily PRN 06/19/19  Yes [provider]  hydrALAZINE (APRESOLINE) 25 MG tablet Take 1 tablet (25 mg total) by mouth 3 (three) times daily. 04/01/20  Yes Tower, Wynelle Fanny, MD  isosorbide mononitrate (IMDUR) 60 MG 24 hr tablet Take 1 tablet (60 mg total) by mouth 2 (two) times daily. 12/20/20  Yes Lorella Nimrod, MD  JARDIANCE 25 MG TABS tablet TAKE 1 TABLET (25 MG TOTAL) BY MOUTH DAILY. 11/17/20  Yes Tower, Wynelle Fanny, MD  LANTUS SOLOSTAR 100 UNIT/ML Solostar Pen Inject 50 Units into the skin at bedtime. 06/16/20  Yes Tower, Wynelle Fanny, MD  losartan (COZAAR) 50 MG tablet TAKE 1 TABLET BY MOUTH EVERY DAY Patient taking differently: Take 50 mg by mouth daily. 12/12/20  Yes Croitoru, Mihai, MD  magnesium oxide (MAG-OX) 400 (241.3 Mg) MG tablet Take 1 tablet (400 mg total) by mouth daily. 04/01/20  Yes Tower, Wynelle Fanny, MD  mupirocin ointment (BACTROBAN) 2 % Apply 1 application topically daily. 11/26/19  Yes McDonald, Stephan Minister, DPM  omeprazole (PRILOSEC) 20 MG capsule Take 1 capsule (20 mg total) by mouth daily. 12/22/20  Yes Tower, Wynelle Fanny, MD  OZEMPIC, 1 MG/DOSE, 4 MG/3ML SOPN INJECT 1 MG UNDER SKIN ONCE A WEEK 11/26/20  Yes Tower, Marne A, MD  potassium chloride SA (KLOR-CON) 20 MEQ tablet Take 1 tablet (20 mEq total) by mouth 2 (two) times daily. 02/03/20  Yes Davonna Belling, MD  tadalafil (CIALIS) 20 MG tablet TAKE ONE TABLET BY MOUTH DAILY AS NEEDED FOR ERECTILE DYSFUNCTION 11/28/20  Yes Tower, Wynelle Fanny, MD  torsemide (DEMADEX) 20 MG tablet Take 1 tablet (20 mg total) by mouth daily. 12/20/20  Yes Lorella Nimrod, MD  potassium chloride (KLOR-CON) 20 MEQ packet Take 20 mEq by mouth 2 (two) times daily.  04/23/11  [provider]    Review of Systems  Constitutional:  Positive for fatigue. Negative for  appetite change.  HENT:  Negative for congestion, postnasal drip and sore throat.   Eyes: Negative.   Respiratory:  Positive for shortness of breath. Negative for apnea, cough and chest tightness.        + snoring  Cardiovascular:  Positive for leg swelling. Negative for chest pain and palpitations.  Gastrointestinal:  Positive for abdominal distention. Negative for abdominal pain.  Endocrine: Negative.   Genitourinary: Negative.   Musculoskeletal:  Negative for back pain and neck pain.  Skin: Negative.   Allergic/Immunologic: Negative.   Neurological:  Negative  for dizziness and light-headedness.  Hematological:  Negative for adenopathy. Does not bruise/bleed easily.  Psychiatric/Behavioral:  Positive for sleep disturbance (sleeping on 3 pillows). Negative for dysphoric mood. The patient is not nervous/anxious.    Vitals:   12/26/20 1237  BP: 115/64  Pulse: 78  Resp: 14  SpO2: 98%  Weight: 242 lb 2 oz (109.8 kg)  Height: 5' 11"  (1.803 m)   Wt Readings from Last 3 Encounters:  12/26/20 242 lb 2 oz (109.8 kg)  12/22/20 239 lb (108.4 kg)  12/19/20 229 lb 3.2 oz (104 kg)   Lab Results  Component Value Date   CREATININE 3.19 (H) 12/22/2020   CREATININE 2.65 (H) 12/19/2020   CREATININE 2.55 (H) 12/18/2020   Physical Exam Vitals and nursing note reviewed. Exam conducted with a chaperone present (wife).  Constitutional:      Appearance: He is well-developed.  HENT:     Head: Normocephalic and atraumatic.  Cardiovascular:     Rate and Rhythm: Normal rate and regular rhythm.  Pulmonary:     Effort: Pulmonary effort is normal. No respiratory distress.     Breath sounds: No wheezing, rhonchi or rales.  Abdominal:     Palpations: Abdomen is soft.     Tenderness: There is no abdominal tenderness.  Musculoskeletal:     Right lower leg: No tenderness. Edema (1+ pitting) present.     Left lower leg: No tenderness. Edema (1+ pitting) present.  Skin:    General: Skin is warm and  dry.  Neurological:     General: No focal deficit present.     Mental Status: He is alert and oriented to person, place, and time.  Psychiatric:        Mood and Affect: Mood normal.        Behavior: Behavior normal.   Assessment & Plan:  1: Chronic heart failure with reduced ejection fraction- - NYHA class II - euvolemic today - weighing daily; instructed to call for an overnight weight gain of > 2 pounds or a weekly weight gain of > 5 pounds - not adding salt and his wife is not cooking with salt either; low sodium cookbook provided - saw cardiology (Croitoru) 08/27/20; has upcoming appointment with [facility] ([facility]) 12/30/20 - on GDMT of carvedilol, jardiance & losartan - renal function does not allow for spironolactone or transitioning to entresto - BNP 12/15/20 was 501.5  2: HTN with CKD- - BP looks good (115/64) - saw PCP (Tower) 12/22/20 - BMP 12/22/20 reviewed and showed sodium 140, potassium 4.3, creatinine 3.19 & GFR 20.08 - per patient request, he would like a [facility] nephrologist so referral has been placed and their office informed; their number was provided on patient's AVS in case they don't hear from them in a week or so  3: DM- - A1c 11/28/20 was 14.7% - glucose was 93 at home this morning; he says that a few years ago, his A1c was down in the 6's and he thought his diabetes was gone so stopped checking his glucose and watching his diet - says that he's now checking it more consistently; going to talk to his endocrinologist about the dexcom  4: Lymphedema- - stage 2 - instructed to get mild compression socks and begin to wear them daily with removal at bedtime - encouraged to elevate his legs when he's sitting for long periods of time; he tends to sit with his legs down - tries to be active during the day - consider compression boots if  edema persists; wife says that she has some that he has used before; advised him to try them again but if they squeeze too tight or he  doesn't notice any squeezing, then the size is incorrect   Medication list reviewed.   Return in 1 month or sooner for any questions/problems before then.

## 2020-12-26 ENCOUNTER — Ambulatory Visit: Payer: 59 | Attending: Family | Admitting: Family

## 2020-12-26 ENCOUNTER — Other Ambulatory Visit: Payer: Self-pay

## 2020-12-26 ENCOUNTER — Encounter: Payer: Self-pay | Admitting: Family

## 2020-12-26 VITALS — BP 115/64 | HR 78 | Resp 14 | Ht 71.0 in | Wt 242.1 lb

## 2020-12-26 DIAGNOSIS — I10 Essential (primary) hypertension: Secondary | ICD-10-CM | POA: Diagnosis not present

## 2020-12-26 DIAGNOSIS — N184 Chronic kidney disease, stage 4 (severe): Secondary | ICD-10-CM | POA: Diagnosis not present

## 2020-12-26 DIAGNOSIS — N183 Chronic kidney disease, stage 3 unspecified: Secondary | ICD-10-CM | POA: Diagnosis not present

## 2020-12-26 DIAGNOSIS — Z87891 Personal history of nicotine dependence: Secondary | ICD-10-CM | POA: Insufficient documentation

## 2020-12-26 DIAGNOSIS — I2510 Atherosclerotic heart disease of native coronary artery without angina pectoris: Secondary | ICD-10-CM | POA: Diagnosis not present

## 2020-12-26 DIAGNOSIS — E1122 Type 2 diabetes mellitus with diabetic chronic kidney disease: Secondary | ICD-10-CM

## 2020-12-26 DIAGNOSIS — E785 Hyperlipidemia, unspecified: Secondary | ICD-10-CM | POA: Diagnosis not present

## 2020-12-26 DIAGNOSIS — I130 Hypertensive heart and chronic kidney disease with heart failure and stage 1 through stage 4 chronic kidney disease, or unspecified chronic kidney disease: Secondary | ICD-10-CM | POA: Diagnosis not present

## 2020-12-26 DIAGNOSIS — I890 Lymphedema, not elsewhere classified: Secondary | ICD-10-CM

## 2020-12-26 DIAGNOSIS — J449 Chronic obstructive pulmonary disease, unspecified: Secondary | ICD-10-CM | POA: Insufficient documentation

## 2020-12-26 DIAGNOSIS — I5022 Chronic systolic (congestive) heart failure: Secondary | ICD-10-CM | POA: Diagnosis not present

## 2020-12-26 DIAGNOSIS — Z794 Long term (current) use of insulin: Secondary | ICD-10-CM

## 2020-12-26 NOTE — Patient Instructions (Addendum)
Continue weighing daily and call for an overnight weight gain of > 2 pounds or a weekly weight gain of >5 pounds.    Begin wearing compression socks daily with removal at bedtime    Referral as made to [facility] [phone]

## 2020-12-27 ENCOUNTER — Other Ambulatory Visit: Payer: Self-pay | Admitting: Family Medicine

## 2020-12-30 ENCOUNTER — Other Ambulatory Visit: Payer: Self-pay

## 2020-12-30 ENCOUNTER — Encounter: Payer: Self-pay | Admitting: Physician Assistant

## 2020-12-30 ENCOUNTER — Ambulatory Visit (INDEPENDENT_AMBULATORY_CARE_PROVIDER_SITE_OTHER): Payer: 59 | Admitting: Nurse Practitioner

## 2020-12-30 VITALS — BP 116/64 | HR 73 | Ht 71.0 in | Wt 242.0 lb

## 2020-12-30 DIAGNOSIS — E1122 Type 2 diabetes mellitus with diabetic chronic kidney disease: Secondary | ICD-10-CM | POA: Diagnosis not present

## 2020-12-30 DIAGNOSIS — N1832 Chronic kidney disease, stage 3b: Secondary | ICD-10-CM

## 2020-12-30 DIAGNOSIS — I5022 Chronic systolic (congestive) heart failure: Secondary | ICD-10-CM | POA: Diagnosis not present

## 2020-12-30 DIAGNOSIS — N183 Chronic kidney disease, stage 3 unspecified: Secondary | ICD-10-CM

## 2020-12-30 DIAGNOSIS — I2510 Atherosclerotic heart disease of native coronary artery without angina pectoris: Secondary | ICD-10-CM | POA: Diagnosis not present

## 2020-12-30 DIAGNOSIS — G4733 Obstructive sleep apnea (adult) (pediatric): Secondary | ICD-10-CM

## 2020-12-30 DIAGNOSIS — E785 Hyperlipidemia, unspecified: Secondary | ICD-10-CM

## 2020-12-30 DIAGNOSIS — I5042 Chronic combined systolic (congestive) and diastolic (congestive) heart failure: Secondary | ICD-10-CM | POA: Diagnosis not present

## 2020-12-30 DIAGNOSIS — I10 Essential (primary) hypertension: Secondary | ICD-10-CM

## 2020-12-30 MED ORDER — HYDRALAZINE HCL 50 MG PO TABS: 50.0000 mg | ORAL_TABLET | Freq: Three times a day (TID) | ORAL | 3 refills | Status: DC

## 2020-12-30 NOTE — Patient Instructions (Signed)
Please call Nephrology today and schedule follow up.  Medication Instructions:  Your physician has recommended you make the following change in your medication:   STOP Losartan INCREASE Hydralazine to 50 mg three times a day  *If you need a refill on your cardiac medications before your next appointment, please call your pharmacy*   Lab Work: BMET today  If you have labs (blood work) drawn today and your tests are completely normal, you will receive your results only by: [place] (if you have MyChart) OR A paper copy in the mail If you have any lab test that is abnormal or we need to change your treatment, we will call you to review the results.   Testing/Procedures: None   Follow-Up: At [facility], you and your health needs are our priority.  As part of our continuing mission to provide you with exceptional heart care, we have created designated Provider Care Teams.  These Care Teams include your primary Cardiologist (physician) and Advanced Practice Providers (APPs -  Physician Assistants and Nurse Practitioners) who all work together to provide you with the care you need, when you need it.   Your next appointment:   1 month(s)  The format for your next appointment:   In Person  Provider:   Christell Faith, PA-C

## 2020-12-30 NOTE — Progress Notes (Signed)
Cardiology Clinic Note   Patient Name: Roberto Allen Date of Encounter: 12/30/2020  Primary Care Provider:  Abner Greenspan, MD Primary Cardiologist:  Sanda Klein, MD  Patient Profile    60 year-old man with a history of non obstructive CAD, HFmrEF, CKD stage IIIb, HTN, HLD, type 2 diabetes, OSA, COPD and GERD who presents for post hospital follow-up as outlined below.     Past Medical History    Past Medical History:  Diagnosis Date   CAD (coronary artery disease)    a. LHC 4/13: mD1 25%, oD2 25%, pOM1 25%, pRCA 25%, dRCA 40%, EF 30%   CHF (congestive heart failure) (HCC)    Chronic kidney disease (CKD), stage III (moderate) (HCC)    Chronic systolic heart failure (HCC)    a. EF 30-35% in 04/2011, improved to 55-60% in 01/2012.   COPD (chronic obstructive pulmonary disease) (HCC)    Diabetes mellitus    GERD (gastroesophageal reflux disease)    History of pneumonia    Hyperkalemia    a. 12/2012 - spironolactone, KCl supp discontinued.   Hyperlipidemia    Hypertension    Hypertriglyceridemia    a. 12/2012 - started on fenofibrate.   NICM (nonischemic cardiomyopathy) ([place])    a. Echo 3/13: mild LVH, EF 30-35%, grade 3 diast dysfxn, mod LAE;  RHC 4/13:  RA 8, RV 58/10, PA 56/18, mean 35, PCWP mean 17, LV 154/27, CO 4.3, CI 2.0. b. 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      Cardiac history dates back to 2013 when he was seen for the evaluation of dyspnea. Echo showed  an EF of 30-35%, diffuse hypokinesis, moderately to severely reduced systolic function, grade 3 diastolic dysfunction, and moderate LAE. He underwent a cardiac catheterization in 05/2011, which showed non-obstructive CAD (mD1 25%, oD2 25%, pOM1 25%, pRCA 25%, dRCA 40%). Follow-up echo of 01/2012 showed an improved EF of 55%. He was hospitalized in 12/2012 for chest pain. Lexiscan myoview on 12/17/2012 showed no evidence of ischemia.   He was admitted to the hospital again in March 2021 with an acute exacerbation of HFimpEF, which was complicated by severely elevated blood pressure and acute renal insufficiency, limiting GDMT. Cardiac catheterization was deferred in the setting of CKD. Echo at the time showed a stable EF of 50-55%. He was seen in the office on 08/27/2020 and was doing well overall on maximally tolerated GDMT, with NYHA class II-III symptoms.   He returned to the ED on 12/15/2020, and was admitted from 12/15/2020-12/19/2020 for worsening shortness of breath, leg swelling, and severely elevated blood pressure in the setting of HFmrEF. Repeat echo showed a reduced EF of 40-45% (down from 50-55% in 2021). Repeat stress test showed no ischemia. He was treated with IV lasix with improvement in CHF symptoms. Escalation of GDMT was limited by CKD. He was discharged on amlodipine, atorvastatin, carvedilol, hydralazine, isosorbide mononitrate,  empagliflozin, losartan, and torsemide. He saw his PCP for follow-up on 12/22/2020, follow-up BUN 37,  sCr 3.19 (up from 34/2.65 at hospital discharge). He was continued on his medications and strongly encouraged to follow-up with neprhology. Additionally, he was seen in the HF clinic on 12/26/2020. He was euvolemic at the time despite a 4 pound weight gain since hospital discharge, and showed improved blood pressure. GDMT continued with carvedilol, jardiance and losartan, hydralazine/isosorbide, and torsemide.   Today he is doing well from a cardiac standpoint. He  reports baseline NYHAII- III symptoms (some shortness of breath with ADLs). He is euvolemic on exam, with mild lower extremity edema. He reports stable weights and home blood pressures (130s-140s/70s/80s). He has not contacted nephrology for follow-up post hospitalization, as he is planning to transition his care to the [place] area. He has returned to work part-time and is tolerating this well.  Home Medication    Current Outpatient Medications  Medication Sig Dispense Refill   ACCU-CHEK FASTCLIX LANCETS MISC USE TO MONITOR BLOOD GLUCOSE 3 TIME(S) DAILY  5   amLODipine (NORVASC) 10 MG tablet Take 1 tablet (10 mg total) by mouth daily. 30 tablet 1   aspirin EC 81 MG EC tablet Take 1 tablet (81 mg total) by mouth daily.     atorvastatin (LIPITOR) 20 MG tablet Take 1 tablet (20 mg total) by mouth daily. 30 tablet 1   BD PEN NEEDLE NANO U/F 32G X 4 MM MISC Inject 1 Units into the skin as needed. 100 each 3   Blood Glucose Monitoring Suppl (ONETOUCH VERIO REFLECT) w/Device KIT 1 Units by Does not apply route as needed. 1 kit 0   carvedilol (COREG) 25 MG tablet Take 1 tablet (25 mg total) by mouth 2 (two) times daily with a meal. 60 tablet 1   Cholecalciferol (VITAMIN D3) 50 MCG (2000 UT) TABS Take 1 tablet by mouth daily.     ergocalciferol (VITAMIN D2) 1.25 MG (50000 UT) capsule Take 50,000 Units by mouth once a week.     glucose blood (ONETOUCH VERIO) test strip Use to check blood sugar 2 times a day 100 each 5   HUMALOG KWIKPEN 100 UNIT/ML KwikPen SMARTSIG:15 Unit(s) SUB-Q 3 Times Daily PRN     hydrALAZINE (APRESOLINE) 50 MG tablet Take 1 tablet (50 mg total) by mouth 3 (three) times daily. 270 tablet 3   isosorbide mononitrate (IMDUR) 60 MG 24 hr tablet Take 1 tablet (60 mg total) by mouth 2 (two) times daily. 60 tablet 1   JARDIANCE 25 MG TABS tablet TAKE 1 TABLET (25 MG TOTAL) BY MOUTH DAILY. 90 tablet 0   LANTUS SOLOSTAR 100 UNIT/ML Solostar Pen Inject 50 Units into the skin at bedtime. 15  mL 5   magnesium oxide (MAG-OX) 400 (241.3 Mg) MG tablet Take 1 tablet (400 mg total) by mouth daily. 90 tablet 3   mupirocin ointment (BACTROBAN) 2 % Apply 1 application topically daily. 30 g 2   omeprazole (PRILOSEC) 20 MG capsule Take 1 capsule (20 mg total) by mouth daily. 90 capsule 3   OZEMPIC, 1 MG/DOSE, 4 MG/3ML SOPN INJECT 1 MG UNDER SKIN ONCE A WEEK 3 mL 0   potassium chloride SA (KLOR-CON) 20 MEQ tablet Take 1 tablet (20 mEq total) by mouth 2 (two) times daily. 10 tablet 0   tadalafil (CIALIS) 20 MG tablet TAKE ONE TABLET BY MOUTH DAILY AS NEEDED FOR ERECTILE DYSFUNCTION 30 tablet 5   torsemide (DEMADEX) 20 MG tablet Take 1 tablet (  20 mg total) by mouth daily. 30 tablet 2   No current facility-administered medications for this visit.     Family History    Family History  Problem Relation Age of Onset   Diabetes Sister    Breast cancer Mother    Breast cancer Maternal Grandmother    He indicated that his mother is deceased. He indicated that the status of his sister is unknown. He indicated that his maternal grandmother is deceased.  Social History    Social History   Socioeconomic History   Marital status: Married    Spouse name: Not on file   Number of children: 6   Years of education: Not on file   Highest education level: Not on file  Occupational History   Occupation: [employer]    Comment: Dentist: [employer]  Tobacco Use   Smoking status: Former    Packs/day: 1.00    Years: 20.00    Pack years: 20.00    Types: Cigarettes    Quit date: 09/15/2006    Years since quitting: 14.3   Smokeless tobacco: Never  Vaping Use   Vaping Use: Never used  Substance and Sexual Activity   Alcohol use: Not Currently    Comment: occ   Drug use: No   Sexual activity: Yes  Other Topics Concern   Not on file  Social History Narrative   Lives with wife.   Social Determinants of Health   Financial Resource Strain: Not on file  Food Insecurity:  Not on file  Transportation Needs: Not on file  Physical Activity: Not on file  Stress: Not on file  Social Connections: Not on file  Intimate Partner Violence: Not on file     Review of Systems    General:  No chills, fever, night sweats or weight changes.  Cardiovascular:  No chest pain.  No orthopnea, palpitations, paroxysmal nocturnal dyspnea. Mild dyspnea on exertion, mild lower extremity edema. Dermatological: No rash, lesions/masses Respiratory: No cough, mild dyspnea on exertion Urologic: No hematuria, dysuria Abdominal:   No nausea, vomiting, diarrhea, bright red blood per rectum, melena, or hematemesis Neurologic:  No visual changes, wkns, changes in mental status. All other systems reviewed and are otherwise negative except as noted above.    Physical Exam    VS:  BP 116/64   Pulse 73   Ht _0  (1.803 m)   Wt 242 lb (109.8 kg)   SpO2 98%   BMI 33.75 kg/m .     GEN: Well nourished, well developed, in no acute distress. HEENT: normal. Neck: Supple, no JVD, carotid bruits, or masses. Cardiac: RRR, no murmurs, rubs, or gallops. Mild, non-pitting lower extremity edema. No clubbing, cyanosis.  Radials/DP/PT 2+ and equal bilaterally.  Respiratory:  Respirations regular and unlabored, clear to auscultation bilaterally. GI: Soft, nontender, nondistended, BS + x 4. MS: no deformity or atrophy. Skin: warm and dry, no rash. Neuro:  Strength and sensation are intact. Psych: Normal affect.  Accessory Clinical Findings    ECG personally reviewed by me today- NSR, 1st degree heart block, rate 71 bpm  - No acute changes  Lab Results  Component Value Date   WBC 8.7 12/16/2020   HGB 12.7 (L) 12/16/2020   HCT 36.9 (L) 12/16/2020   MCV 92.7 12/16/2020   PLT 317 12/16/2020   Lab Results  Component Value Date   CREATININE 3.19 (H) 12/22/2020   BUN 37 (H) 12/22/2020   NA 140 12/22/2020  K 4.3 12/22/2020   CL 103 12/22/2020   CO2 29 12/22/2020   Lab Results   Component Value Date   ALT 14 12/15/2020   AST 18 12/15/2020   ALKPHOS 86 12/15/2020   BILITOT 0.9 12/15/2020   Lab Results  Component Value Date   CHOL 261 (H) 11/28/2020   HDL 42.10 11/28/2020   LDLCALC UNABLE TO CALCULATE IF TRIGLYCERIDE OVER 400 mg/dL 12/17/2012   LDLDIRECT 120.0 11/28/2020   TRIG (H) 11/28/2020    507.0 Triglyceride is over 400; calculations on Lipids are invalid.   CHOLHDL 6 11/28/2020    Lab Results  Component Value Date   HGBA1C 14.7 (A) 11/28/2020    Assessment & Plan   1.  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  Echo showed reduced EF of 40-45% (down from 50-55% in 2021). Ischemic work-up included negative stress test. GDMT limited in the setting of worsening renal function. He is euvolemic on exam today, weight stable, BP improved. Due to worsening renal function, repeat BMET today, will stop Losartan. Continue hydralazine/imdur. Consider discontinuation of Jardiance and/or torsemide pending BMET results.   2. CKD IIIa: Follow-up BMET post hospital discharge showed worsening renal function with a  BUN 37,  Crt 3.19 (up from 34/2.65 at time of  discharge). He follows with nephrology, however he is transitioning to a nephrologist in [place] and has not yet scheduled an appointment. Renal function currently limiting escalation of GDMT (unable to transition to entresto). Repeat BMET today. In the setting of worsening renal function, will stop Losartan. Consider discontinuation of Jardiance and/or torsemide pending BMET results. He will call nephrology today to schedule a follow-up appointment.   3. Hypertension: BP is much improved, 116/64 in office today with recorded home readings in the 130-140s/70s/80s. Repeat BMET today in setting of worsening renal function. Will stop losartan. Increase hydralazine to 50 mg tid in the setting of historically elevated blood pressure. Continue imdur and amlodipine.   4. Non-obstructive  CAD: Cath in 2013 showed non-obstructive CAD. Recent stress test negative for ischemia. No symptoms of angina. Continue ASA, statin, carvedilol.   5. Hyperlipidemia: LDL 120 on 11/28/2020. Continue atorvastatin.   6. OSA: History of OSA with last sleep study in 2013. He has never used a CPAP. He is in need of a repeat sleep study. Will address at next follow-up visit.   7. Diabetes: A1C 03/2020 13.7. Followed by primary care.   8. Disposition: Repeat BMET today to assess renal function. D/C losartan. Increase hydralazine to 50 mg tid. Consider discontinuation of Jardiance and/or torsemide if worsening renal function. Follow-up OSA at next visit. He will call nephrology to schedule follow-up. Follow-up in clinic in 1 month.    Lenna Sciara, NP 12/30/2020, 4:45 PM

## 2020-12-30 NOTE — Telephone Encounter (Signed)
I reviewed this with last labs, visit and cardiology visit. Did he ever get our message about last labs? He needs renal f/u as soon as possible and he is supposed to have a nephrologist.    Has he been taking this potassium? Now with worsening renal dz it may not be appropriate.

## 2020-12-30 NOTE — Telephone Encounter (Signed)
Med was given in ER on 02/03/20 #10 tabs with 0 refills, please advise

## 2020-12-31 LAB — BASIC METABOLIC PANEL
BUN/Creatinine Ratio: 7 — ABNORMAL LOW (ref 10–24)
BUN: 19 mg/dL (ref 8–27)
CO2: 23 mmol/L (ref 20–29)
Calcium: 9.1 mg/dL (ref 8.6–10.2)
Chloride: 104 mmol/L (ref 96–106)
Creatinine, Ser: 2.79 mg/dL — ABNORMAL HIGH (ref 0.76–1.27)
Glucose: 78 mg/dL (ref 70–99)
Potassium: 3.9 mmol/L (ref 3.5–5.2)
Sodium: 142 mmol/L (ref 134–144)
eGFR: 25 mL/min/{1.73_m2} — ABNORMAL LOW (ref >59)

## 2021-01-05 ENCOUNTER — Telehealth: Payer: Self-pay

## 2021-01-05 NOTE — Telephone Encounter (Signed)
Please triage  ----- Message -----  From: Roberto Allen  Sent: 01/03/2021  11:04 AM EST  To: [facility] Support Pool  Subject: Appointment scheduled from [place]               Appointment For: Roberto Allen ([id number])  Visit Type: OFFICE VISIT (1004)    01/06/2021   8:00 AM  30 mins.  Tower, Wynelle Fanny, MD        [facility]    Patient Comments:  Shortness of breath and swelling. Also pain in sides and back pain. Also could not start back to work shortness of breath while trying to lift or walk.     Appointment Scheduled (Newest Message First)  Buckson, Terrill D  You 3 hours ago (8:04 AM)   TB Please triage       Roberto Allen  Patient Appointment Schedule Request Pool 2 days ago   Appointment For: Haig Gerardo ([id number]) Visit Type: OFFICE VISIT (1004)   01/06/2021   8:00 AM  30 mins.  Tower, Wynelle Fanny, MD        [facility]   Patient Comments: Shortness of breath and swelling. Also pain in sides and back pain. Also could not start back to work shortness of breath while trying to lift or walk.    Mychart, Generic  Roberto Allen 2 days ago   GM Appointment Information:     Visit Type: Office Visit         Date: 01/06/2021                 Dept: [facility]                 Provider: Loura Pardon                 Time: 8:00 AM                 Length: 30 min   Appt Status: Scheduled     Appt Instructions:    Please arrive 15 minutes prior to your appointment. This will allow [place] to  verify and update your medical record and ensure a full appointment for you  within the time allotted.    This MyChart message has not been read.

## 2021-01-05 NOTE — Telephone Encounter (Signed)
Glad he is coming in, will see him tomorrow  Agree with ER precautions

## 2021-01-05 NOTE — Telephone Encounter (Signed)
I spoke with pt; pt is sore on both sides of rib cage since 12/15/20. No known injury and no CP. Pt has slight coughing on and off since 12/15/20. Pt has SOB upon exertion but not when resting. Pt saw card on 01/02/21 and pt was advised lungs sound good and does not think SOB heart related. Pt has swelling in lower legs below knees. Swelling goes down over night with feet elevated. Pt has been taking torsemide 20 mg one daily since being in hospital. Pt does not have fever, chills, body aches, H/A,S/T or other covid symptoms except cough and SOB. Pt said covid test while at [facility] neg on 12/19/20; pt did home covid test that was negative on 01/04/21. Pt does not want to go to UC or ED and wants to keep appt with Dr Glori Bickers on 01/06/21 at 8 AM. UC & ED precautions given and pt voiced understanding.sending note to Dr Glori Bickers and Rexene Agent and will teams [place] also.

## 2021-01-06 ENCOUNTER — Encounter: Payer: Self-pay | Admitting: Family Medicine

## 2021-01-06 ENCOUNTER — Other Ambulatory Visit: Payer: Self-pay

## 2021-01-06 ENCOUNTER — Telehealth: Payer: Self-pay | Admitting: Medical

## 2021-01-06 ENCOUNTER — Ambulatory Visit (INDEPENDENT_AMBULATORY_CARE_PROVIDER_SITE_OTHER): Payer: 59 | Admitting: Family Medicine

## 2021-01-06 VITALS — BP 124/68 | HR 72 | Temp 97.8°F | Ht 71.0 in | Wt 239.2 lb

## 2021-01-06 DIAGNOSIS — I10 Essential (primary) hypertension: Secondary | ICD-10-CM

## 2021-01-06 DIAGNOSIS — N1832 Chronic kidney disease, stage 3b: Secondary | ICD-10-CM

## 2021-01-06 DIAGNOSIS — I5033 Acute on chronic diastolic (congestive) heart failure: Secondary | ICD-10-CM | POA: Diagnosis not present

## 2021-01-06 DIAGNOSIS — N183 Chronic kidney disease, stage 3 unspecified: Secondary | ICD-10-CM | POA: Diagnosis not present

## 2021-01-06 DIAGNOSIS — Z794 Long term (current) use of insulin: Secondary | ICD-10-CM

## 2021-01-06 DIAGNOSIS — E1122 Type 2 diabetes mellitus with diabetic chronic kidney disease: Secondary | ICD-10-CM | POA: Diagnosis not present

## 2021-01-06 LAB — CBC WITH DIFFERENTIAL/PLATELET
Basophils Absolute: 0 10*3/uL (ref 0.0–0.1)
Basophils Relative: 0.5 % (ref 0.0–3.0)
Eosinophils Absolute: 0.2 10*3/uL (ref 0.0–0.7)
Eosinophils Relative: 3 % (ref 0.0–5.0)
HCT: 37.4 % — ABNORMAL LOW (ref 39.0–52.0)
Hemoglobin: 12.4 g/dL — ABNORMAL LOW (ref 13.0–17.0)
Lymphocytes Relative: 23 % (ref 12.0–46.0)
Lymphs Abs: 1.8 10*3/uL (ref 0.7–4.0)
MCHC: 33.1 g/dL (ref 30.0–36.0)
MCV: 91.2 fl (ref 78.0–100.0)
Monocytes Absolute: 0.7 10*3/uL (ref 0.1–1.0)
Monocytes Relative: 8.7 % (ref 3.0–12.0)
Neutro Abs: 5 10*3/uL (ref 1.4–7.7)
Neutrophils Relative %: 64.8 % (ref 43.0–77.0)
Platelets: 329 10*3/uL (ref 150.0–400.0)
RBC: 4.1 Mil/uL — ABNORMAL LOW (ref 4.22–5.81)
RDW: 14.4 % (ref 11.5–15.5)
WBC: 7.7 10*3/uL (ref 4.0–10.5)

## 2021-01-06 LAB — BASIC METABOLIC PANEL
BUN: 27 mg/dL — ABNORMAL HIGH (ref 6–23)
CO2: 29 mEq/L (ref 19–32)
Calcium: 9.5 mg/dL (ref 8.4–10.5)
Chloride: 103 mEq/L (ref 96–112)
Creatinine, Ser: 3.22 mg/dL — ABNORMAL HIGH (ref 0.40–1.50)
GFR: 19.85 mL/min — ABNORMAL LOW (ref >60.00)
Glucose, Bld: 97 mg/dL (ref 70–99)
Potassium: 3.9 mEq/L (ref 3.5–5.1)
Sodium: 141 mEq/L (ref 135–145)

## 2021-01-06 NOTE — Telephone Encounter (Signed)
I spoke with the patient. He advised he had a 4 lb weight gain over the weekend, but when seen by his PCP today (Dr. Glori Bickers) he was told his weights were stable. He states his wife has reported to him that he has "cankles."  The patient has continued on torsemide 20 mg once daily. Dr. Glori Bickers did discontinue the patient's Jardiance today due to his kidney issues and the patient confirms this.  He is trying to re-establish with his kidney doctor in [facility], Dr. Marland Kitchen, as the kidney doctors in [place] cannot see him until January 2023.  The patient has requested to be seen in our office ASAP to follow up. He is scheduled tomorrow in office with Cadence [place], [place].   I have confirmed with the patient that his symptoms are currently stable and we will see him in office tomorrow as scheduled.  The patient voices understanding and is agreeable. He was very appreciative of the call back.

## 2021-01-06 NOTE — Assessment & Plan Note (Signed)
bp in fair control at this time  BP Readings from Last 1 Encounters:  01/06/21 124/68   No changes needed Most recent labs reviewed  Disc lifstyle change with low sodium diet and exercise  Reviewed recent cardiology notes  Taking  Hydralazine 50 mg tid (may cause some swelling) imdur 60 mg bid Amlodipine 10 mg daily (also swelling) Torsemide 20 mg daily  Carvedilol 25 mg bid

## 2021-01-06 NOTE — Progress Notes (Signed)
Subjective:    Patient ID: Roberto Allen, male    DOB: Jan 20, 1961, 60 y.o.   MRN: [id number]  This visit occurred during the SARS-CoV-2 public health emergency.  Safety protocols were in place, including screening questions prior to the visit, additional usage of staff PPE, and extensive cleaning of exam room while observing appropriate contact time as indicated for disinfecting solutions.   HPI Pt presents for side pain/sob/CHF  Wt Readings from Last 3 Encounters:  01/06/21 239 lb 4 oz (108.5 kg)  12/30/20 242 lb (109.8 kg)  12/26/20 242 lb 2 oz (109.8 kg)   33.37 kg/m  Doing better with diet and glucose control   He tried to go back to work too early Still some shortness of breath with exertion (really thought he could do it) He filed for short term disability yesterday   More swelling since Friday  Needs to call cardiology   He tracks daily weight  Wt went up 4 lb when constipated   Wears support socks   Some pain in his sides of back and then mid low back - lifting at work (last week)  It hurt to stand  Improved now  He took some tylenol    Had f/u with cardiology on 11/15 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 Losartan was d/c due to worsened renal function Has f/u planned in mid December   HTN was improved  Losartan d/c and hydralazine inc to 50 mg tid  Imdur 60 mg bid Amlodipine 10 mg daily  Torsemide 20 mg daily Carvedilol 25 mg bid  BP Readings from Last 3 Encounters:  01/06/21 124/68  12/30/20 116/64  12/26/20 115/64   Pulse Readings from Last 3 Encounters:  01/06/21 72  12/30/20 73  12/26/20 78      Potassium- not taking    CKD 3b Onsider d/c of jardiance and /or torsemide  Last GFR 25 Dr Marland Kitchen in the past-no longer sees  Getting appt with practice in [place]/will let us know  Lab Results  Component Value Date   CREATININE 2.79 (H) 12/30/2020   BUN 19 12/30/2020   NA 142 12/30/2020   K 3.9 12/30/2020   CL 104 12/30/2020   CO2  23 12/30/2020   DM Endocrinology Dr Hartford Poli [place] longer seen   Lab Results  Component Value Date   HGBA1C 14.7 (A) 11/28/2020   Trying to set up new endocrinologist in [place]  Blood sugar is in much better control now  94-120s   Jardiance 25 mg daily  Ozemmpic 1 mg weekly  Humulin 10 u prn Lantus 50 u daily  Patient Active Problem List   Diagnosis Date Noted   Dilated cardiomyopathy ([place]) 12/19/2020   CHF exacerbation ([place]) 12/16/2020   Acute on chronic diastolic CHF (congestive heart failure) ([place]) 12/16/2020   Hypokalemia 12/16/2020   CHF (congestive heart failure) ([place]) 04/28/2019   Hypertension secondary to other renal disorders 11/29/2016   GERD (gastroesophageal reflux disease) 07/23/2016   Degenerative disc disease at L5-S1 level 04/09/2016   Primary osteoarthritis of right hip 04/09/2016   Obesity, Class I, BMI 30-34.9 02/06/2016   Ulcer of left foot ([place]) 01/06/2015   Microalbuminuria due to type 2 diabetes mellitus ([place]) 10/03/2014   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 diabetes mellitus ([place]) 06/26/2014   NICM (nonischemic cardiomyopathy) ([place]) 09/06/2013   ED (erectile dysfunction) 03/27/2013   Disorder of kidney and ureter 12/17/2012   DM (diabetes mellitus), type 2 with renal complications ([place]) 67/54/4920   Coronary  Artery Disease (non-obstructive by cath 05/2011) 06/14/2011   Hyperlipidemia associated with type 2 diabetes mellitus ([place]) 06/14/2011   OSA (obstructive sleep apnea) 05/25/2011   Hypertension    Past Medical History:  Diagnosis Date   CAD (coronary artery disease)    a. LHC 4/13: mD1 25%, oD2 25%, pOM1 25%, pRCA 25%, dRCA 40%, EF 30%   CHF (congestive heart failure) (HCC)    Chronic kidney disease (CKD), stage III (moderate) (HCC)    Chronic systolic heart failure (HCC)    a. EF 30-35% in 04/2011, improved to 55-60% in 01/2012.   COPD (chronic obstructive pulmonary disease) (HCC)    Diabetes mellitus    GERD (gastroesophageal reflux disease)     History of pneumonia    Hyperkalemia    a. 12/2012 - spironolactone, KCl supp discontinued.   Hyperlipidemia    Hypertension    Hypertriglyceridemia    a. 12/2012 - started on fenofibrate.   NICM (nonischemic cardiomyopathy) ([place])    a. Echo 3/13: mild LVH, EF 30-35%, grade 3 diast dysfxn, mod LAE;  RHC 4/13:  RA 8, RV 58/10, PA 56/18, mean 35, PCWP mean 17, LV 154/27, CO 4.3, CI 2.0. b. EF Improved >>> Echo (01/2012):  Mild LVH, EF 55-60%, no RWMA, Gr 1 DD   Past Surgical History:  Procedure Laterality Date   CARDIAC CATHETERIZATION  2013   non-obs dz 25-40% multiple vessels   TENOTOMY  2018   Social History   Tobacco Use   Smoking status: Former    Packs/day: 1.00    Years: 20.00    Pack years: 20.00    Types: Cigarettes    Quit date: 09/15/2006    Years since quitting: 14.3   Smokeless tobacco: Never  Vaping Use   Vaping Use: Never used  Substance Use Topics   Alcohol use: Not Currently    Comment: occ   Drug use: No   Family History  Problem Relation Age of Onset   Diabetes Sister    Breast cancer Mother    Breast cancer Maternal Grandmother    Allergies  Allergen Reactions   Metformin And Related Other (See Comments)    Due to CHF and CRI   Current Outpatient Medications on File Prior to Visit  Medication Sig Dispense Refill   ACCU-CHEK FASTCLIX LANCETS MISC USE TO MONITOR BLOOD GLUCOSE 3 TIME(S) DAILY  5   amLODipine (NORVASC) 10 MG tablet Take 1 tablet (10 mg total) by mouth daily. 30 tablet 1   aspirin EC 81 MG EC tablet Take 1 tablet (81 mg total) by mouth daily.     atorvastatin (LIPITOR) 20 MG tablet Take 1 tablet (20 mg total) by mouth daily. 30 tablet 1   BD PEN NEEDLE NANO U/F 32G X 4 MM MISC Inject 1 Units into the skin as needed. 100 each 3   Blood Glucose Monitoring Suppl (ONETOUCH VERIO REFLECT) w/Device KIT 1 Units by Does not apply route as needed. 1 kit 0   carvedilol (COREG) 25 MG tablet Take 1 tablet (25 mg total) by mouth 2 (two) times  daily with a meal. 60 tablet 1   Cholecalciferol (VITAMIN D3) 50 MCG (2000 UT) TABS Take 1 tablet by mouth daily.     ergocalciferol (VITAMIN D2) 1.25 MG (50000 UT) capsule Take 50,000 Units by mouth once a week.     glucose blood (ONETOUCH VERIO) test strip Use to check blood sugar 2 times a day 100 each [place]  100 UNIT/ML KwikPen SMARTSIG:15 Unit(s) SUB-Q 3 Times Daily PRN     hydrALAZINE (APRESOLINE) 50 MG tablet Take 1 tablet (50 mg total) by mouth 3 (three) times daily. 270 tablet 3   isosorbide mononitrate (IMDUR) 60 MG 24 hr tablet Take 1 tablet (60 mg total) by mouth 2 (two) times daily. 60 tablet 1   LANTUS SOLOSTAR 100 UNIT/ML Solostar Pen Inject 50 Units into the skin at bedtime. 15 mL 5   magnesium oxide (MAG-OX) 400 (241.3 Mg) MG tablet Take 1 tablet (400 mg total) by mouth daily. 90 tablet 3   mupirocin ointment (BACTROBAN) 2 % Apply 1 application topically daily. 30 g 2   omeprazole (PRILOSEC) 20 MG capsule Take 1 capsule (20 mg total) by mouth daily. 90 capsule 3   OZEMPIC, 1 MG/DOSE, 4 MG/3ML SOPN INJECT 1 MG UNDER SKIN ONCE A WEEK 3 mL 0   tadalafil (CIALIS) 20 MG tablet TAKE ONE TABLET BY MOUTH DAILY AS NEEDED FOR ERECTILE DYSFUNCTION 30 tablet 5   torsemide (DEMADEX) 20 MG tablet Take 1 tablet (20 mg total) by mouth daily. 30 tablet 2   [DISCONTINUED] potassium chloride (KLOR-CON) 20 MEQ packet Take 20 mEq by mouth 2 (two) times daily.     No current facility-administered medications on file prior to visit.    Review of Systems  Constitutional:  Positive for fatigue. Negative for activity change, appetite change, fever and unexpected weight change.  HENT:  Negative for congestion, rhinorrhea, sore throat and trouble swallowing.   Eyes:  Negative for pain, redness, itching and visual disturbance.  Respiratory:  Positive for shortness of breath. Negative for cough, chest tightness, wheezing and stridor.   Cardiovascular:  Positive for leg swelling. Negative for  chest pain and palpitations.  Gastrointestinal:  Negative for abdominal pain, blood in stool, constipation, diarrhea and nausea.  Endocrine: Negative for cold intolerance, heat intolerance, polydipsia and polyuria.  Genitourinary:  Negative for difficulty urinating, dysuria, frequency and urgency.  Musculoskeletal:  Negative for arthralgias, joint swelling and myalgias.  Skin:  Negative for pallor and rash.  Neurological:  Negative for dizziness, tremors, weakness, numbness and headaches.  Hematological:  Negative for adenopathy. Does not bruise/bleed easily.  Psychiatric/Behavioral:  Negative for decreased concentration and dysphoric mood. The patient is not nervous/anxious.       Objective:   Physical Exam Constitutional:      General: He is not in acute distress.    Appearance: Normal appearance. He is well-developed. He is obese. He is not ill-appearing or diaphoretic.  HENT:     Head: Normocephalic and atraumatic.     Right Ear: Tympanic membrane, ear canal and external ear normal.     Left Ear: Tympanic membrane, ear canal and external ear normal.     Nose: Nose normal. No congestion.     Mouth/Throat:     Mouth: Mucous membranes are moist.     Pharynx: Oropharynx is clear. No posterior oropharyngeal erythema.  Eyes:     General: No scleral icterus.       Right eye: No discharge.        Left eye: No discharge.     Conjunctiva/sclera: Conjunctivae normal.     Pupils: Pupils are equal, round, and reactive to light.  Neck:     Thyroid: No thyromegaly.     Vascular: No carotid bruit or JVD.  Cardiovascular:     Rate and Rhythm: Normal rate and regular rhythm.     Pulses: Normal pulses.  Heart sounds: Normal heart sounds.    No gallop.  Pulmonary:     Effort: Pulmonary effort is normal. No respiratory distress.     Breath sounds: Normal breath sounds. No stridor. No wheezing, rhonchi or rales.     Comments: Good air exch No crackles or rales  Chest:     Chest wall: No  tenderness.  Abdominal:     General: Bowel sounds are normal. There is no distension or abdominal bruit.     Palpations: Abdomen is soft. There is no mass.     Tenderness: There is no abdominal tenderness.     Hernia: No hernia is present.  Musculoskeletal:        General: No tenderness.     Cervical back: Normal range of motion and neck supple. No rigidity. No muscular tenderness.     Right lower leg: Edema present.     Left lower leg: Edema present.     Comments: One plus pedal edema  Not pitting    Lymphadenopathy:     Cervical: No cervical adenopathy.  Skin:    General: Skin is warm and dry.     Coloration: Skin is not pale.     Findings: No erythema or rash.  Neurological:     Mental Status: He is alert.     Cranial Nerves: No cranial nerve deficit.     Motor: No abnormal muscle tone.     Coordination: Coordination normal.     Gait: Gait normal.     Deep Tendon Reflexes: Reflexes are normal and symmetric. Reflexes normal.  Psychiatric:        Mood and Affect: Mood normal.        Cognition and Memory: Cognition normal.          Assessment & Plan:   Problem List Items Addressed This Visit       Cardiovascular and Mediastinum   Hypertension    bp in fair control at this time  BP Readings from Last 1 Encounters:  01/06/21 124/68  No changes needed Most recent labs reviewed  Disc lifstyle change with low sodium diet and exercise  Reviewed recent cardiology notes  Taking  Hydralazine 50 mg tid (may cause some swelling) imdur 60 mg bid Amlodipine 10 mg daily (also swelling) Torsemide 20 mg daily  Carvedilol 25 mg bid       Relevant Orders   CBC with Differential/Platelet (Completed)   Basic metabolic panel (Completed)   CHF (congestive heart failure) (HCC)    Lungs sound good an nl pulse ox He is planning time off from work after last hosp-did poorly trying to get back  Deconditioned Edema today may be from meds, wt is down Continue cardiology f/u and  daily wt        Endocrine   DM (diabetes mellitus), type 2 with renal complications ([place])    Lab Results  Component Value Date   HGBA1C 14.7 (A) 11/28/2020  Per pt glucose readings are much better  D/c Jardiance today due to KD Getting set up with new endoc in [place] and will call us with name and appt time  ozempic 1 mg weekly  Humulin prn  lantus 50 u daily  disc imp of low glycemic diet and wt loss to prevent DM2       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 diabetes mellitus ([place]) - Primary    Worsened  Last GFR 25  Unable to contact pt about last labs-now aware he  has to pick up non recognized numbers  Needs to get est with renal asap=he is setting up care in [place] and will let us know  Cardiology took him off losartan  Per pt glucose control is improved  We d/c jardiance for the CKD      Relevant Orders   CBC with Differential/Platelet (Completed)   Basic metabolic panel (Completed)

## 2021-01-06 NOTE — Assessment & Plan Note (Signed)
Lungs sound good an nl pulse ox He is planning time off from work after last hosp-did poorly trying to get back  Deconditioned Edema today may be from meds, wt is down Continue cardiology f/u and daily wt

## 2021-01-06 NOTE — Assessment & Plan Note (Signed)
Worsened  Last GFR 25  Unable to contact pt about last labs-now aware he has to pick up non recognized numbers  Needs to get est with renal asap=he is setting up care in [place] and will let us know  Cardiology took him off losartan  Per pt glucose control is improved  We d/c jardiance for the CKD

## 2021-01-06 NOTE — Assessment & Plan Note (Signed)
Lab Results  Component Value Date   HGBA1C 14.7 (A) 11/28/2020   Per pt glucose readings are much better  D/c Jardiance today due to KD Getting set up with new endoc in [place] and will call us with name and appt time  ozempic 1 mg weekly  Humulin prn  lantus 50 u daily  disc imp of low glycemic diet and wt loss to prevent DM2

## 2021-01-06 NOTE — Patient Instructions (Addendum)
Start answering your spam calls   Call the kidney doctor immediately  Call the endocrinologist (diabetic doctor) also   Message [place] with mychart to tell me when both appointments are   Stop the jardiance for now due to kidney function  Stay off the potassium unless we or cardiology prescribe it and let you know   Lab today  Your lungs sound good

## 2021-01-06 NOTE — Telephone Encounter (Signed)
Pt c/o swelling: STAT is pt has developed SOB within 24 hours  If swelling, where is the swelling located? BLE   How much weight have you gained and in what time span? 4 lb gain today at pcp back to normal   Have you gained 3 pounds in a day or 5 pounds in a week? 4 lbs in 2 days   Do you have a log of your daily weights (if so, list)? yes  Are you currently taking a fluid pill? yes  Are you currently SOB? Yes worse on exertion and goes away at rest   Have you traveled recently? no   Patient last seen by ryan but wants asap per pcp instructions at his visit to address some acute HF symptoms.   Scheduled 11-23 with cadance 220

## 2021-01-07 ENCOUNTER — Encounter: Payer: Self-pay | Admitting: Medical

## 2021-01-07 ENCOUNTER — Ambulatory Visit (INDEPENDENT_AMBULATORY_CARE_PROVIDER_SITE_OTHER): Payer: 59 | Admitting: Medical

## 2021-01-07 ENCOUNTER — Telehealth: Payer: Self-pay | Admitting: Family Medicine

## 2021-01-07 ENCOUNTER — Telehealth: Payer: Self-pay | Admitting: Medical

## 2021-01-07 VITALS — BP 110/60 | HR 75 | Ht 71.0 in | Wt 243.5 lb

## 2021-01-07 DIAGNOSIS — N183 Chronic kidney disease, stage 3 unspecified: Secondary | ICD-10-CM

## 2021-01-07 DIAGNOSIS — E1122 Type 2 diabetes mellitus with diabetic chronic kidney disease: Secondary | ICD-10-CM

## 2021-01-07 DIAGNOSIS — I428 Other cardiomyopathies: Secondary | ICD-10-CM | POA: Diagnosis not present

## 2021-01-07 DIAGNOSIS — I2510 Atherosclerotic heart disease of native coronary artery without angina pectoris: Secondary | ICD-10-CM

## 2021-01-07 DIAGNOSIS — G4733 Obstructive sleep apnea (adult) (pediatric): Secondary | ICD-10-CM

## 2021-01-07 DIAGNOSIS — R600 Localized edema: Secondary | ICD-10-CM | POA: Diagnosis not present

## 2021-01-07 DIAGNOSIS — R0683 Snoring: Secondary | ICD-10-CM

## 2021-01-07 DIAGNOSIS — I5042 Chronic combined systolic (congestive) and diastolic (congestive) heart failure: Secondary | ICD-10-CM | POA: Diagnosis not present

## 2021-01-07 DIAGNOSIS — I5033 Acute on chronic diastolic (congestive) heart failure: Secondary | ICD-10-CM

## 2021-01-07 MED ORDER — AMLODIPINE BESYLATE 5 MG PO TABS: 30.0000 mg | ORAL_TABLET | Freq: Every day | ORAL | 2 refills | Status: DC

## 2021-01-07 NOTE — Progress Notes (Signed)
Cardiology Office Note:    Date:  01/07/2021   ID:  Roberto Allen, DOB 12/28/1960, MRN [id number]  PCP:  Abner Greenspan, MD  [facility] Cardiologist:  Sanda Klein, MD  [facility] Electrophysiologist:  None   Referring MD: Abner Greenspan, MD   Chief Complaint: 2 week follow-up  History of Present Illness:    Roberto Allen is a 61 y.o. male with a hx of nonobstructive CAD, HFmrEF, CKD stage 3b, HTN, HLD, DM2, OSA, COPD, GERD who presents for follow-up.   Cardiac history dates back to 2013 when he was seen for the evaluation of dyspnea. Echo showed an EF of 30-35%, diffuse hypokinesis, moderately to severely reduced systolic function, grade 3 diastolic dysfunction, and moderate LAE. He underwent a cardiac catheterization in 05/2011, which showed non-obstructive CAD (mD1 25%, oD2 25%, pOM1 25%, pRCA 25%, dRCA 40%). Follow-up echo of 01/2012 showed an improved EF of 55%. He was hospitalized in 12/2012 for chest pain. Lexiscan myoview on 12/17/2012 showed no evidence of ischemia.    He was admitted to the hospital again in March 2021 with an acute exacerbation of HFimpEF, which was complicated by severely elevated blood pressure and acute renal insufficiency, limiting GDMT. Cardiac catheterization was deferred in the setting of CKD. Echo at the time showed a stable EF of 50-55%. He was seen in the office on 08/27/2020 and was doing well overall on maximally tolerated GDMT, with NYHA class II-III symptoms.    He returned to the ED on 12/15/2020, and was admitted from 12/15/2020-12/19/2020 for worsening shortness of breath, leg swelling, and severely elevated blood pressure in the setting of HFmrEF. Repeat echo showed a reduced EF of 40-45% (down from 50-55% in 2021). Repeat stress test showed no ischemia. He was treated with IV lasix with improvement in CHF symptoms. Escalation of GDMT was limited by CKD. He was discharged on amlodipine, atorvastatin, carvedilol, hydralazine, isosorbide  mononitrate, empagliflozin, losartan, and torsemide. He saw his PCP for follow-up on 12/22/2020, follow-up BUN 37,  sCr 3.19 (up from 34/2.65 at hospital discharge). He was continued on his medications and strongly encouraged to follow-up with neprhology. Additionally, he was seen in the HF clinic on 12/26/2020. He was euvolemic at the time despite a 4 pound weight gain since hospital discharge, and showed improved blood pressure. GDMT continued with carvedilol, jardiance and losartan, hydralazine/isosorbide, and torsemide.   The patient was last seen 12/30/20 and was overall doing well from a cardiac standpoint. Losartan was stopped for worsening renal function. Hydralazine was increased. Labs were drawn.   Today, the patient reports he is having shortness of breath and lower leg edema. Says symptoms started 1-2 weeks ago. Breathing worse on exertion. No chest pain. Has not been eating more salt than normal. He plans on seeing kidney doctor. He is on torsemide 77m daily, he did not take any extra doses. Lower leg swelling is worse at the end of the day.  Jardiance was stopped for worsening kidney function. Labs from 11/22 showed Scr 3.22, BUN 27.    Past Medical History:  Diagnosis Date   CAD (coronary artery disease)    a. LHC 4/13: mD1 25%, oD2 25%, pOM1 25%, pRCA 25%, dRCA 40%, EF 30%   CHF (congestive heart failure) (HCC)    Chronic kidney disease (CKD), stage III (moderate) (HCC)    Chronic systolic heart failure (HCC)    a. EF 30-35% in 04/2011, improved to 55-60% in 01/2012.   COPD (chronic obstructive pulmonary disease) ([facility]  Diabetes mellitus    GERD (gastroesophageal reflux disease)    History of pneumonia    Hyperkalemia    a. 12/2012 - spironolactone, KCl supp discontinued.   Hyperlipidemia    Hypertension    Hypertriglyceridemia    a. 12/2012 - started on fenofibrate.   NICM (nonischemic cardiomyopathy) ([place])    a. Echo 3/13: mild LVH, EF 30-35%, grade 3 diast dysfxn, mod  LAE;  RHC 4/13:  RA 8, RV 58/10, PA 56/18, mean 35, PCWP mean 17, LV 154/27, CO 4.3, CI 2.0. b. EF Improved >>> Echo (01/2012):  Mild LVH, EF 55-60%, no RWMA, Gr 1 DD    Past Surgical History:  Procedure Laterality Date   CARDIAC CATHETERIZATION  2013   non-obs dz 25-40% multiple vessels   TENOTOMY  2018    Current Medications: Current Meds  Medication Sig   ACCU-CHEK FASTCLIX LANCETS MISC USE TO MONITOR BLOOD GLUCOSE 3 TIME(S) DAILY   aspirin EC 81 MG EC tablet Take 1 tablet (81 mg total) by mouth daily.   atorvastatin (LIPITOR) 20 MG tablet Take 1 tablet (20 mg total) by mouth daily.   BD PEN NEEDLE NANO U/F 32G X 4 MM MISC Inject 1 Units into the skin as needed.   Blood Glucose Monitoring Suppl (ONETOUCH VERIO REFLECT) w/Device KIT 1 Units by Does not apply route as needed.   carvedilol (COREG) 25 MG tablet Take 1 tablet (25 mg total) by mouth 2 (two) times daily with a meal.   Cholecalciferol (VITAMIN D3) 50 MCG (2000 UT) TABS Take 1 tablet by mouth daily.   ergocalciferol (VITAMIN D2) 1.25 MG (50000 UT) capsule Take 50,000 Units by mouth once a week.   glucose blood (ONETOUCH VERIO) test strip Use to check blood sugar 2 times a day   HUMALOG KWIKPEN 100 UNIT/ML KwikPen SMARTSIG:15 Unit(s) SUB-Q 3 Times Daily PRN   hydrALAZINE (APRESOLINE) 50 MG tablet Take 1 tablet (50 mg total) by mouth 3 (three) times daily.   isosorbide mononitrate (IMDUR) 60 MG 24 hr tablet Take 1 tablet (60 mg total) by mouth 2 (two) times daily.   LANTUS SOLOSTAR 100 UNIT/ML Solostar Pen Inject 50 Units into the skin at bedtime.   magnesium oxide (MAG-OX) 400 (241.3 Mg) MG tablet Take 1 tablet (400 mg total) by mouth daily.   mupirocin ointment (BACTROBAN) 2 % Apply 1 application topically daily.   omeprazole (PRILOSEC) 20 MG capsule Take 1 capsule (20 mg total) by mouth daily.   OZEMPIC, 1 MG/DOSE, 4 MG/3ML SOPN INJECT 1 MG UNDER SKIN ONCE A WEEK   tadalafil (CIALIS) 20 MG tablet TAKE ONE TABLET BY MOUTH  DAILY AS NEEDED FOR ERECTILE DYSFUNCTION   torsemide (DEMADEX) 20 MG tablet Take 1 tablet (20 mg total) by mouth daily.   [DISCONTINUED] amLODipine (NORVASC) 10 MG tablet Take 1 tablet (10 mg total) by mouth daily.     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   Social Determinants of Health   Financial Resource Strain: Not on file  Food Insecurity: Not on file  Transportation Needs: Not on file  Physical Activity: Not on file  Stress: Not on file  Social Connections: Not on file     Family History: The patient's family history includes Breast cancer in his maternal grandmother and mother; Diabetes in his sister.  ROS:   Please see the history of present illness.     All other systems reviewed and are negative.  EKGs/Labs/Other Studies Reviewed:    The following studies were reviewed today:  Myoview Lexiscan 12/18/20 Narrative & Impression  Pharmacological myocardial perfusion imaging study with no significant  ischemia Global hypokinesis , EF estimated at 33% No EKG changes concerning for ischemia at peak stress or in recovery. CT attenuation correction images with proximal/mid LAD calcification, distal RCA, no significant aortic atherosclerosis Low  risk scan     Signed, Esmond Plants, MD, Ph.D [facility]    Echo 12/16/20   1. Left ventricular ejection fraction, by estimation, is 40 to 45%. The  left ventricle has mildly decreased function. The left ventricle  demonstrates global hypokinesis. There is more severe hypokinesis of the  basal inferior/inferolateral segments.  There is moderate left ventricular hypertrophy. Left ventricular diastolic  parameters are consistent with Grade I diastolic dysfunction (impaired  relaxation).   2. Right ventricular systolic function is normal. The right ventricular  size is mildly enlarged. Tricuspid regurgitation signal is inadequate for  assessing PA pressure.   3. Left atrial size was mildly dilated.   4. Right atrial size was moderately dilated.   5. The mitral valve is normal in structure. Trivial mitral valve  regurgitation. No evidence of mitral stenosis.   6. The aortic valve has an indeterminant number of cusps. Aortic valve  regurgitation is not visualized. No aortic stenosis is present.   Echo 04/28/19  1. Left ventricular ejection fraction, by estimation, is 50 to 55%. The  left ventricle has low normal function. The left ventricle demonstrates  regional wall motion abnormalities (see scoring diagram/findings for  description). There is mild left  ventricular hypertrophy. Left ventricular diastolic parameters are  indeterminate.   2. Right ventricular systolic function is normal. The right ventricular  size is normal. Tricuspid regurgitation signal is inadequate for assessing  PA pressure.   3. The mitral valve is normal in structure. No evidence of mitral valve  regurgitation. No evidence of mitral stenosis.   4. The aortic valve is normal in structure. Aortic valve regurgitation is  trivial. No aortic stenosis is present.   5. The inferior vena cava is normal in size with greater than 50%  respiratory variability, suggesting right atrial pressure of 3 mmHg.   EKG:   EKG is not ordered today.    Recent Labs: 02/02/2020: Magnesium 2.0 03/20/2020: TSH 1.30 12/15/2020: ALT 14; B Natriuretic Peptide 501.5 01/06/2021: BUN 27; Creatinine, Ser 3.22; Hemoglobin 12.4; Platelets 329.0; Potassium 3.9; Sodium 141  Recent Lipid Panel    Component Value Date/Time   CHOL 261 (H) 11/28/2020 0844   TRIG (H) 11/28/2020 2542  507.0 Triglyceride is over 400; calculations on Lipids are invalid.   HDL 42.10 11/28/2020 0844   CHOLHDL 6 11/28/2020 0844   VLDL UNABLE TO CALCULATE IF TRIGLYCERIDE OVER 400 mg/dL 12/17/2012 0145   LDLCALC UNABLE TO CALCULATE IF TRIGLYCERIDE OVER 400 mg/dL 12/17/2012 0145   LDLDIRECT 120.0 11/28/2020 0844     Physical Exam:    VS:  BP 110/60 (BP Location: Left Arm, Patient Position: Sitting, Cuff Size: Large)   Pulse 75   Ht _0  (1.803 m)   Wt 243 lb 8 oz (110.5 kg)   SpO2 98%   BMI 33.96 kg/m     Wt Readings from Last 3 Encounters:  01/07/21 243 lb 8 oz (110.5 kg)  01/06/21 239 lb 4 oz (108.5 kg)  12/30/20 242 lb (109.8 kg)     GEN:  Well nourished, well developed in no acute distress HEENT: Normal NECK: No JVD; No carotid bruits LYMPHATICS: No lymphadenopathy CARDIAC: RRR, no murmurs, rubs, gallops RESPIRATORY:  Clear to auscultation without rales, wheezing or rhonchi  ABDOMEN: Soft, non-tender, non-distended MUSCULOSKELETAL:  No edema; No deformity  SKIN: Warm and dry NEUROLOGIC:  Alert and oriented x 3 PSYCHIATRIC:  Normal affect   ASSESSMENT:    1. Chronic combined systolic (congestive) and diastolic (congestive) heart failure ([place])   2. 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 diabetes mellitus ([place])   3. NICM (nonischemic cardiomyopathy) ([place])   4. Lower leg edema   5. Coronary artery disease involving native coronary artery of native heart without angina pectoris   6. OSA (obstructive sleep apnea)    PLAN:    In order of problems listed above:  Lower leg edema/SOB HFmrEF secondary to NICM Patient reports lower leg  edema and shortness of breath for 1-2 weeks. Appears some of the lower leg edema is dependent, improved with compression socks. Recent labs showed dehydration and Jardiance was stopped. He has been taking Torsemide 43m daily and has not increased it due to kidney function. Losartan previously stopped for CKD. On exam he does not appear volume overloaded. He reports he eats low salt, elevates legs, and wears compression socks. I will leave Torsemide at current dose. I will decrease amlodipine to 533mdaily. I agree he needs to establish with nephrology.   CKD stage 3 Patient trying to establish with nephrology. Recent labs Scr 3.22, BUN 27 and Jardiance was held. Losartan previously stopped. He is on torsemide 2091maily.   HTN BP good today, continue current medications. Decrease amlodipine to 5mg29mily.   Nonobstructive CAD Cath in 2013 showed nonobstructive CAD. Recent stress negative for ischemia. No anginal symptoms. Continue ASA, statin, Coreg.   HLD LDL 120 11/2020. Continue atorvastatin.   OSA Will refer to pulmonology for sleep study  Disposition: Follow up in 1 month(s) with MD/APP     Signed, Kiyla Ringler H FuNinfa Meeker-C  01/07/2021 2:42 PM     [facility]

## 2021-01-07 NOTE — Telephone Encounter (Signed)
I did a referral Not sure where his doctor is   Looks like his cardiologist also placed a referral

## 2021-01-07 NOTE — Telephone Encounter (Signed)
-----   Message from Ophelia Shoulder, [place] sent at 01/07/2021  3:08 PM EST ----- Patient called and was given results. Patient stated understanding and stated that he would need a referral to Dr. Marland Kitchen, per their request. Please advise.

## 2021-01-07 NOTE — Telephone Encounter (Signed)
Patient seen by Tarri Glenn, PA today. She forgot to mention to the patient the need for e referral to pulmonology for a sleep apnea evaluation. Patient is agreeable. Referral placed. Advised the patient that [facility] will contact him to schedule the appt. Patient voiced appreciation for the call.

## 2021-01-07 NOTE — Patient Instructions (Addendum)
Medication Instructions:  Your physician has recommended you make the following change in your medication:   REDUCE Amlodipine to 5 mg daily. An Rx has been sent to your pharmacy.   *If you need a refill on your cardiac medications before your next appointment, please call your pharmacy*   Lab Work: None ordered If you have labs (blood work) drawn today and your tests are completely normal, you will receive your results only by: [place] (if you have MyChart) OR A paper copy in the mail If you have any lab test that is abnormal or we need to change your treatment, we will call you to review the results.   Testing/Procedures: None ordered   Follow-Up: At [facility], you and your health needs are our priority.  As part of our continuing mission to provide you with exceptional heart care, we have created designated Provider Care Teams.  These Care Teams include your primary Cardiologist (physician) and Advanced Practice Providers (APPs -  Physician Assistants and Nurse Practitioners) who all work together to provide you with the care you need, when you need it.  We recommend signing up for the patient portal called "MyChart".  Sign up information is provided on this After Visit Summary.  MyChart is used to connect with patients for Virtual Visits (Telemedicine).  Patients are able to view lab/test results, encounter notes, upcoming appointments, etc.  Non-urgent messages can be sent to your provider as well.   To learn more about what you can do with MyChart, go to [email or web address].    Your next appointment:   As planned  On 01/26/21 @ 8:25 am  The format for your next appointment:   In Person  Provider:   Christell Faith, PA-C    Other Instructions N/A

## 2021-01-11 ENCOUNTER — Encounter: Payer: Self-pay | Admitting: Family Medicine

## 2021-01-13 MED ORDER — TORSEMIDE 20 MG PO TABS: 20.0000 mg | ORAL_TABLET | Freq: Every day | ORAL | 1 refills | Status: DC

## 2021-01-13 MED ORDER — CARVEDILOL 25 MG PO TABS: 25.0000 mg | ORAL_TABLET | Freq: Two times a day (BID) | ORAL | 1 refills | Status: DC

## 2021-01-13 MED ORDER — ATORVASTATIN CALCIUM 20 MG PO TABS: 20.0000 mg | ORAL_TABLET | Freq: Every day | ORAL | 1 refills | Status: DC

## 2021-01-13 MED ORDER — ISOSORBIDE MONONITRATE ER 60 MG PO TB24: 60.0000 mg | ORAL_TABLET | Freq: Two times a day (BID) | ORAL | 1 refills | Status: DC

## 2021-01-16 ENCOUNTER — Encounter: Payer: Self-pay | Admitting: Medical

## 2021-01-16 ENCOUNTER — Other Ambulatory Visit: Payer: Self-pay

## 2021-01-16 ENCOUNTER — Ambulatory Visit (INDEPENDENT_AMBULATORY_CARE_PROVIDER_SITE_OTHER): Payer: 59 | Admitting: Medical

## 2021-01-16 ENCOUNTER — Telehealth: Payer: Self-pay | Admitting: Medical

## 2021-01-16 VITALS — BP 130/78 | HR 84 | Ht 71.0 in | Wt 245.8 lb

## 2021-01-16 DIAGNOSIS — I5033 Acute on chronic diastolic (congestive) heart failure: Secondary | ICD-10-CM

## 2021-01-16 DIAGNOSIS — E1122 Type 2 diabetes mellitus with diabetic chronic kidney disease: Secondary | ICD-10-CM

## 2021-01-16 DIAGNOSIS — N183 Chronic kidney disease, stage 3 unspecified: Secondary | ICD-10-CM

## 2021-01-16 DIAGNOSIS — I10 Essential (primary) hypertension: Secondary | ICD-10-CM

## 2021-01-16 DIAGNOSIS — G4733 Obstructive sleep apnea (adult) (pediatric): Secondary | ICD-10-CM

## 2021-01-16 DIAGNOSIS — E782 Mixed hyperlipidemia: Secondary | ICD-10-CM

## 2021-01-16 DIAGNOSIS — I2510 Atherosclerotic heart disease of native coronary artery without angina pectoris: Secondary | ICD-10-CM

## 2021-01-16 DIAGNOSIS — R600 Localized edema: Secondary | ICD-10-CM

## 2021-01-16 NOTE — Telephone Encounter (Signed)
Pt called stated  Rx torsemide (DEMADEX) 20 MG tablet was not filled stated  pharmacy said it need to be approve .

## 2021-01-16 NOTE — Telephone Encounter (Signed)
Pharmacy said pt just received refill on 12/19/20 that's why they didn't fill it again, call pt and he said in the mean time he did find that bottle of med that was filled on 12/19/20 and he is okay

## 2021-01-16 NOTE — Telephone Encounter (Signed)
Error

## 2021-01-16 NOTE — Progress Notes (Signed)
Cardiology Office Note:    Date:  01/16/2021   ID:  Roberto Allen, DOB December 14, 1960, MRN [id number]  PCP:  Abner Greenspan, MD  [facility] Cardiologist:  Sanda Klein, MD  [facility] Electrophysiologist:  None   Referring MD: Abner Greenspan, MD   Chief Complaint: 1 month follow-up  History of Present Illness:    Roberto Allen is a 60 y.o. male with a hx of with a hx of nonobstructive CAD, HFmrEF, CKD stage 3b, HTN, HLD, DM2, OSA, COPD, GERD who presents for follow-up.    Cardiac history dates back to 2013 when he was seen for the evaluation of dyspnea. Echo showed an EF of 30-35%, diffuse hypokinesis, moderately to severely reduced systolic function, grade 3 diastolic dysfunction, and moderate LAE. He underwent a cardiac catheterization in 05/2011, which showed non-obstructive CAD (mD1 25%, oD2 25%, pOM1 25%, pRCA 25%, dRCA 40%). Follow-up echo of 01/2012 showed an improved EF of 55%. He was hospitalized in 12/2012 for chest pain. Lexiscan myoview on 12/17/2012 showed no evidence of ischemia.    He was admitted to the hospital again in March 2021 with an acute exacerbation of HFimpEF, which was complicated by severely elevated blood pressure and acute renal insufficiency, limiting GDMT. Cardiac catheterization was deferred in the setting of CKD. Echo at the time showed a stable EF of 50-55%. He was seen in the office on 08/27/2020 and was doing well overall on maximally tolerated GDMT, with NYHA class II-III symptoms.    He returned to the ED on 12/15/2020, and was admitted from 12/15/2020-12/19/2020 for worsening shortness of breath, leg swelling, and severely elevated blood pressure in the setting of HFmrEF. Repeat echo showed a reduced EF of 40-45% (down from 50-55% in 2021). Repeat stress test showed no ischemia. He was treated with IV lasix with improvement in CHF symptoms. Escalation of GDMT was limited by CKD. He was discharged on amlodipine, atorvastatin, carvedilol, hydralazine,  isosorbide mononitrate, empagliflozin, losartan, and torsemide. He saw his PCP for follow-up on 12/22/2020, follow-up BUN 37,  sCr 3.19 (up from 34/2.65 at hospital discharge). He was continued on his medications and strongly encouraged to follow-up with neprhology. Additionally, he was seen in the HF clinic on 12/26/2020. He was euvolemic at the time despite a 4 pound weight gain since hospital discharge, and showed improved blood pressure. GDMT continued with carvedilol, jardiance and losartan, hydralazine/isosorbide, and torsemide.    The patient was seen 12/30/20 and was overall doing well from a cardiac standpoint. Losartan was stopped for worsening renal function. Hydralazine was increased. Labs were drawn.   Last seen 01/07/21 and reported SOB and LLE. He was on torsemide 64m daily. Amlodipine was decreased to 516mdaily.    Today, the patient reports persistent lower leg edema. Reports dependent lower leg edema daily. He wears compression socks and elevates his feet. Reports persistent shortness of breath on exertion. BNP from 10/31 was 501. He has not doubled up on his torsemide. No chest pain, orthopnea, pnd. Eats low salt diet. Labs 11/2 Scr 3.22/BUN 27    Past Medical History:  Diagnosis Date   CAD (coronary artery disease)    a. LHC 4/13: mD1 25%, oD2 25%, pOM1 25%, pRCA 25%, dRCA 40%, EF 30%   CHF (congestive heart failure) (HCC)    Chronic kidney disease (CKD), stage III (moderate) (HCC)    Chronic systolic heart failure (HCC)    a. EF 30-35% in 04/2011, improved to 55-60% in 01/2012.   COPD (chronic obstructive  pulmonary disease) ([place])    Diabetes mellitus    GERD (gastroesophageal reflux disease)    History of pneumonia    Hyperkalemia    a. 12/2012 - spironolactone, KCl supp discontinued.   Hyperlipidemia    Hypertension    Hypertriglyceridemia    a. 12/2012 - started on fenofibrate.   NICM (nonischemic cardiomyopathy) ([place])    a. Echo 3/13: mild LVH, EF 30-35%, grade 3  diast dysfxn, mod LAE;  RHC 4/13:  RA 8, RV 58/10, PA 56/18, mean 35, PCWP mean 17, LV 154/27, CO 4.3, CI 2.0. b. EF Improved >>> Echo (01/2012):  Mild LVH, EF 55-60%, no RWMA, Gr 1 DD    Past Surgical History:  Procedure Laterality Date   CARDIAC CATHETERIZATION  2013   non-obs dz 25-40% multiple vessels   TENOTOMY  2018    Current Medications: Current Meds  Medication Sig   ACCU-CHEK FASTCLIX LANCETS MISC USE TO MONITOR BLOOD GLUCOSE 3 TIME(S) DAILY   amLODipine (NORVASC) 5 MG tablet Take 6 tablets (30 mg total) by mouth daily.   aspirin EC 81 MG EC tablet Take 1 tablet (81 mg total) by mouth daily.   atorvastatin (LIPITOR) 20 MG tablet Take 1 tablet (20 mg total) by mouth daily.   BD PEN NEEDLE NANO U/F 32G X 4 MM MISC Inject 1 Units into the skin as needed.   Blood Glucose Monitoring Suppl (ONETOUCH VERIO REFLECT) w/Device KIT 1 Units by Does not apply route as needed.   carvedilol (COREG) 25 MG tablet Take 1 tablet (25 mg total) by mouth 2 (two) times daily with a meal.   Cholecalciferol (VITAMIN D3) 50 MCG (2000 UT) TABS Take 1 tablet by mouth daily.   ergocalciferol (VITAMIN D2) 1.25 MG (50000 UT) capsule Take 50,000 Units by mouth once a week.   glucose blood (ONETOUCH VERIO) test strip Use to check blood sugar 2 times a day   HUMALOG KWIKPEN 100 UNIT/ML KwikPen SMARTSIG:15 Unit(s) SUB-Q 3 Times Daily PRN   hydrALAZINE (APRESOLINE) 50 MG tablet Take 1 tablet (50 mg total) by mouth 3 (three) times daily.   isosorbide mononitrate (IMDUR) 60 MG 24 hr tablet Take 1 tablet (60 mg total) by mouth 2 (two) times daily.   LANTUS SOLOSTAR 100 UNIT/ML Solostar Pen Inject 50 Units into the skin at bedtime.   magnesium oxide (MAG-OX) 400 (241.3 Mg) MG tablet Take 1 tablet (400 mg total) by mouth daily.   mupirocin ointment (BACTROBAN) 2 % Apply 1 application topically daily.   omeprazole (PRILOSEC) 20 MG capsule Take 1 capsule (20 mg total) by mouth daily.   OZEMPIC, 1 MG/DOSE, 4 MG/3ML SOPN  INJECT 1 MG UNDER SKIN ONCE A WEEK   tadalafil (CIALIS) 20 MG tablet TAKE ONE TABLET BY MOUTH DAILY AS NEEDED FOR ERECTILE DYSFUNCTION   torsemide (DEMADEX) 20 MG tablet Take 1 tablet (20 mg total) by mouth daily.     Allergies:   Metformin and related   Social History   Socioeconomic History   Marital status: Married    Spouse name: Not on file   Number of children: 6   Years of education: Not on file   Highest education level: Not on file  Occupational History   Occupation: [employer]    Comment: Dentist: [place]  Tobacco Use   Smoking status: Former    Packs/day: 1.00    Years: 20.00    Pack years: 20.00    Types: Cigarettes  Quit date: 09/15/2006    Years since quitting: 14.3   Smokeless tobacco: Never  Vaping Use   Vaping Use: Never used  Substance and Sexual Activity   Alcohol use: Not Currently    Comment: occ   Drug use: No   Sexual activity: Yes  Other Topics Concern   Not on file  Social History Narrative   Lives with wife.   Social Determinants of Health   Financial Resource Strain: Not on file  Food Insecurity: Not on file  Transportation Needs: Not on file  Physical Activity: Not on file  Stress: Not on file  Social Connections: Not on file     Family History: The patient's family history includes Breast cancer in his maternal grandmother and mother; Diabetes in his sister.  ROS:   Please see the history of present illness.     All other systems reviewed and are negative.  EKGs/Labs/Other Studies Reviewed:    The following studies were reviewed today:   Myoview Lexiscan 12/18/20 Narrative & Impression  Pharmacological myocardial perfusion imaging study with no significant  ischemia Global hypokinesis , EF estimated at 33% No EKG changes concerning for ischemia at peak stress or in recovery. CT attenuation correction images with proximal/mid LAD calcification, distal RCA, no significant aortic atherosclerosis Low  risk scan     Signed, Esmond Plants, MD, Ph.D [facility]      Echo 12/16/20    1. Left ventricular ejection fraction, by estimation, is 40 to 45%. The  left ventricle has mildly decreased function. The left ventricle  demonstrates global hypokinesis. There is more severe hypokinesis of the  basal inferior/inferolateral segments.  There is moderate left ventricular hypertrophy. Left ventricular diastolic  parameters are consistent with Grade I diastolic dysfunction (impaired  relaxation).   2. Right ventricular systolic function is normal. The right ventricular  size is mildly enlarged. Tricuspid regurgitation signal is inadequate for  assessing PA pressure.   3. Left atrial size was mildly dilated.   4. Right atrial size was moderately dilated.   5. The mitral valve is normal in structure. Trivial mitral valve  regurgitation. No evidence of mitral stenosis.   6. The aortic valve has an indeterminant number of cusps. Aortic valve  regurgitation is not visualized. No aortic stenosis is present.    Echo 04/28/19  1. Left ventricular ejection fraction, by estimation, is 50 to 55%. The  left ventricle has low normal function. The left ventricle demonstrates  regional wall motion abnormalities (see scoring diagram/findings for  description). There is mild left  ventricular hypertrophy. Left ventricular diastolic parameters are  indeterminate.   2. Right ventricular systolic function is normal. The right ventricular  size is normal. Tricuspid regurgitation signal is inadequate for assessing  PA pressure.   3. The mitral valve is normal in structure. No evidence of mitral valve  regurgitation. No evidence of mitral stenosis.   4. The aortic valve is normal in structure. Aortic valve regurgitation is  trivial. No aortic stenosis is present.   5. The inferior vena cava is normal in size with greater than 50%  respiratory variability, suggesting right atrial pressure of 3 mmHg.     EKG:  EKG is not ordered today.  Recent Labs: 02/02/2020: Magnesium 2.0 03/20/2020: TSH 1.30 12/15/2020: ALT 14; B Natriuretic Peptide 501.5 01/06/2021: BUN 27; Creatinine, Ser 3.22; Hemoglobin 12.4; Platelets 329.0; Potassium 3.9; Sodium 141  Recent Lipid Panel    Component Value Date/Time   CHOL 261 (H) 11/28/2020 6333  TRIG (H) 11/28/2020 0844    507.0 Triglyceride is over 400; calculations on Lipids are invalid.   HDL 42.10 11/28/2020 0844   CHOLHDL 6 11/28/2020 0844   VLDL UNABLE TO CALCULATE IF TRIGLYCERIDE OVER 400 mg/dL 12/17/2012 0145   LDLCALC UNABLE TO CALCULATE IF TRIGLYCERIDE OVER 400 mg/dL 12/17/2012 0145   LDLDIRECT 120.0 11/28/2020 0844      Physical Exam:    VS:  BP 130/78   Pulse 84   Ht _0  (1.803 m)   Wt 245 lb 12.8 oz (111.5 kg)   SpO2 97%   BMI 34.28 kg/m     Wt Readings from Last 3 Encounters:  01/16/21 245 lb 12.8 oz (111.5 kg)  01/07/21 243 lb 8 oz (110.5 kg)  01/06/21 239 lb 4 oz (108.5 kg)     GEN:  Well nourished, well developed in no acute distress HEENT: Normal NECK: No JVD; No carotid bruits LYMPHATICS: No lymphadenopathy CARDIAC: RRR, no murmurs, rubs, gallops RESPIRATORY:  Clear to auscultation without rales, wheezing or rhonchi  ABDOMEN: Soft, non-tender, non-distended MUSCULOSKELETAL:  minimal lower leg edema; No deformity  SKIN: Warm and dry NEUROLOGIC:  Alert and oriented x 3 PSYCHIATRIC:  Normal affect   ASSESSMENT:    1. Acute on chronic diastolic CHF (congestive heart failure) ([place])   2. 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 diabetes mellitus ([place])   3. Essential hypertension   4. Lower leg edema   5. OSA (obstructive sleep apnea)   6. Hyperlipidemia, mixed   7. Coronary artery disease involving native coronary artery of native heart without angina pectoris    PLAN:    In order of problems listed above:  Lower leg edema HFrEF Minimal LLE on exam despite decrease in amlodipine at the last visit. He is on torsemide  78m daily. BNP from October was elevated but more recent labs showed some dehydration. He has appointment with nephrology in January. Check BMET and BNP today. Increase Torsemide to 251mBID x3 days and then back down to 2056maily. He reports he eats low salt, elevates legs, and wears compression socks. No Acei/ARB/Entresto/SGLT2/Jardiance for CKD.   CKD stage 3 Patient has appointment with nephrology in January. BMET today.   HTN BP good. Continue current medications.   Nonobstructive CAD Patient denies chest pain. Cath in 2013 showed nonobstructive CAD.  Continue Aspirin 39m30mily, Lipitor, Imdur, Coreg. No further work-up at this time.   HLD LDL 120 11/2020. Continue Lipitor.   OSA Referred to pulmonology for sleep study, awaiting at home sleep study.  Disposition: Follow up in 2 month(s) with MD/APP    Signed, Nakota Ackert H FuNinfa Meeker-C  01/16/2021 10:25 AM    [facility]

## 2021-01-16 NOTE — Patient Instructions (Signed)
Medication Instructions:  Your physician has recommended you make the following change in your medication:   INCREASE Torsemide to 20 mg twice a day for 3 days. The go back down to 20 mg daily.   *If you need a refill on your cardiac medications before your next appointment, please call your pharmacy*   Lab Work: Bmp and Bnp today  If you have labs (blood work) drawn today and your tests are completely normal, you will receive your results only by: [place] (if you have MyChart) OR A paper copy in the mail If you have any lab test that is abnormal or we need to change your treatment, we will call you to review the results.   Testing/Procedures: None ordered   Follow-Up: At [facility], you and your health needs are our priority.  As part of our continuing mission to provide you with exceptional heart care, we have created designated Provider Care Teams.  These Care Teams include your primary Cardiologist (physician) and Advanced Practice Providers (APPs -  Physician Assistants and Nurse Practitioners) who all work together to provide you with the care you need, when you need it.  We recommend signing up for the patient portal called "MyChart".  Sign up information is provided on this After Visit Summary.  MyChart is used to connect with patients for Virtual Visits (Telemedicine).  Patients are able to view lab/test results, encounter notes, upcoming appointments, etc.  Non-urgent messages can be sent to your provider as well.   To learn more about what you can do with MyChart, go to [email or web address].    Your next appointment:   3 month(s)  The format for your next appointment:   In Person  Provider:   Sanda Klein, MD {    Other Instructions N/A

## 2021-01-17 LAB — BASIC METABOLIC PANEL
BUN/Creatinine Ratio: 10 (ref 10–24)
BUN: 27 mg/dL (ref 8–27)
CO2: 24 mmol/L (ref 20–29)
Calcium: 9.3 mg/dL (ref 8.6–10.2)
Chloride: 103 mmol/L (ref 96–106)
Creatinine, Ser: 2.82 mg/dL — ABNORMAL HIGH (ref 0.76–1.27)
Glucose: 179 mg/dL — ABNORMAL HIGH (ref 70–99)
Potassium: 4 mmol/L (ref 3.5–5.2)
Sodium: 144 mmol/L (ref 134–144)
eGFR: 25 mL/min/{1.73_m2} — ABNORMAL LOW (ref >59)

## 2021-01-17 LAB — BRAIN NATRIURETIC PEPTIDE: BNP: 150.5 pg/mL — ABNORMAL HIGH (ref 0.0–100.0)

## 2021-01-25 ENCOUNTER — Encounter: Payer: Self-pay | Admitting: Family Medicine

## 2021-01-26 ENCOUNTER — Encounter: Payer: Self-pay | Admitting: Family Medicine

## 2021-01-26 ENCOUNTER — Ambulatory Visit: Payer: 59 | Admitting: Physician Assistant

## 2021-01-28 ENCOUNTER — Encounter: Payer: Self-pay | Admitting: Family Medicine

## 2021-01-29 ENCOUNTER — Ambulatory Visit (HOSPITAL_BASED_OUTPATIENT_CLINIC_OR_DEPARTMENT_OTHER): Payer: 59 | Admitting: Family

## 2021-01-29 ENCOUNTER — Other Ambulatory Visit
Admission: RE | Admit: 2021-01-29 | Discharge: 2021-01-29 | Disposition: A | Discharge status: Home / Self Care | Payer: 59 | Source: Ambulatory Visit | Attending: Family | Admitting: Family

## 2021-01-29 ENCOUNTER — Other Ambulatory Visit: Payer: Self-pay | Admitting: Family

## 2021-01-29 ENCOUNTER — Ambulatory Visit: Payer: 59 | Admitting: Student

## 2021-01-29 ENCOUNTER — Other Ambulatory Visit: Payer: Self-pay

## 2021-01-29 ENCOUNTER — Encounter: Payer: Self-pay | Admitting: Family

## 2021-01-29 ENCOUNTER — Telehealth: Payer: Self-pay

## 2021-01-29 VITALS — BP 135/63 | HR 81 | Resp 18 | Ht 71.0 in | Wt 244.5 lb

## 2021-01-29 DIAGNOSIS — K219 Gastro-esophageal reflux disease without esophagitis: Secondary | ICD-10-CM | POA: Insufficient documentation

## 2021-01-29 DIAGNOSIS — I10 Essential (primary) hypertension: Secondary | ICD-10-CM | POA: Diagnosis not present

## 2021-01-29 DIAGNOSIS — Z87891 Personal history of nicotine dependence: Secondary | ICD-10-CM | POA: Insufficient documentation

## 2021-01-29 DIAGNOSIS — I890 Lymphedema, not elsewhere classified: Secondary | ICD-10-CM

## 2021-01-29 DIAGNOSIS — E875 Hyperkalemia: Secondary | ICD-10-CM | POA: Insufficient documentation

## 2021-01-29 DIAGNOSIS — I5042 Chronic combined systolic (congestive) and diastolic (congestive) heart failure: Secondary | ICD-10-CM | POA: Insufficient documentation

## 2021-01-29 DIAGNOSIS — E785 Hyperlipidemia, unspecified: Secondary | ICD-10-CM | POA: Insufficient documentation

## 2021-01-29 DIAGNOSIS — J449 Chronic obstructive pulmonary disease, unspecified: Secondary | ICD-10-CM | POA: Insufficient documentation

## 2021-01-29 DIAGNOSIS — Z79899 Other long term (current) drug therapy: Secondary | ICD-10-CM | POA: Insufficient documentation

## 2021-01-29 DIAGNOSIS — N189 Chronic kidney disease, unspecified: Secondary | ICD-10-CM | POA: Insufficient documentation

## 2021-01-29 DIAGNOSIS — E1122 Type 2 diabetes mellitus with diabetic chronic kidney disease: Secondary | ICD-10-CM

## 2021-01-29 DIAGNOSIS — I2510 Atherosclerotic heart disease of native coronary artery without angina pectoris: Secondary | ICD-10-CM | POA: Insufficient documentation

## 2021-01-29 DIAGNOSIS — I5022 Chronic systolic (congestive) heart failure: Secondary | ICD-10-CM | POA: Insufficient documentation

## 2021-01-29 DIAGNOSIS — I130 Hypertensive heart and chronic kidney disease with heart failure and stage 1 through stage 4 chronic kidney disease, or unspecified chronic kidney disease: Secondary | ICD-10-CM | POA: Insufficient documentation

## 2021-01-29 DIAGNOSIS — N183 Chronic kidney disease, stage 3 unspecified: Secondary | ICD-10-CM

## 2021-01-29 LAB — BASIC METABOLIC PANEL
Anion gap: 8 (ref 5–15)
BUN: 39 mg/dL — ABNORMAL HIGH (ref 8–23)
CO2: 29 mmol/L (ref 22–32)
Calcium: 9.1 mg/dL (ref 8.9–10.3)
Chloride: 102 mmol/L (ref 98–111)
Creatinine, Ser: 2.98 mg/dL — ABNORMAL HIGH (ref 0.61–1.24)
GFR, Estimated: 23 mL/min — ABNORMAL LOW (ref >60)
Glucose, Bld: 87 mg/dL (ref 70–99)
Potassium: 3.1 mmol/L — ABNORMAL LOW (ref 3.5–5.1)
Sodium: 139 mmol/L (ref 135–145)

## 2021-01-29 MED ORDER — POTASSIUM CHLORIDE CRYS ER 20 MEQ PO TBCR: 20.0000 meq | EXTENDED_RELEASE_TABLET | Freq: Every day | ORAL | 0 refills | Status: DC

## 2021-01-29 MED ORDER — HYDRALAZINE HCL 100 MG PO TABS: 100.0000 mg | ORAL_TABLET | Freq: Three times a day (TID) | ORAL | 3 refills | Status: DC

## 2021-01-29 NOTE — Patient Instructions (Addendum)
Continue weighing daily and call for an overnight weight gain of 3 pounds or more or a weekly weight gain of more than 5 pounds.    Stop taking amlodipine and see if leg swelling improves   Increase hydralazine to 100mg  three times daily. You can finish your current dose by taking 2 tablets three times daily. When you pick up the new prescription, it will be the 100mg  dose so you will resume taking 1 tablet three times daily.    Referral will be made for compression boots. Let us know if you haven't heard from them in 2 weeks.   Get a larger size of compression socks. In the meantime, you can use ACE wraps to wrap your legs.

## 2021-01-29 NOTE — Progress Notes (Signed)
Patient ID: Roberto Allen, male    DOB: January 25, 1961, 60 y.o.   MRN: [id number]  HPI  [name] is Allen 60 y/o male with Allen history of CAD, DM, hyperlipidemia, HTN, CKD, COPD, GERD, hyperkalemia, previous tobacco use and chronic heart failure.   Echo report from 12/16/20 reviewed and showed an EF of 40-45% along with moderate LVH,mild LAE and trivial MR.   Admitted 12/16/20 due to shortness of breath and pedal edema. Cardiology consult obtained. Medications adjusted. Discharged after 3 days.   He presents today for his follow-up visit with Allen chief complaint of minimal shortness of breath upon moderate exertion. He says that this has been present for several years. He has associated fatigue, pedal edema (worsening), abdominal distention and slight weight gain along with this. He denies any difficulty sleeping, dizziness, palpitations, chest pain or cough.   He's been unable to wear the compression socks because his legs have become more swollen and the socks hurt his legs. Is back at work so is quite active walking although it's on concrete floors.  He says that he's had HF for Allen long time and never had this much swelling in his legs. Watching sodium carefully. Earlier this week took 3 days of extra diuretic per cardiology recommendation but doesn't think it's helped much.   Past Medical History:  Diagnosis Date   CAD (coronary artery disease)    Allen. LHC 4/13: mD1 25%, oD2 25%, pOM1 25%, pRCA 25%, dRCA 40%, EF 30%   CHF (congestive heart failure) (HCC)    Chronic kidney disease (CKD), stage III (moderate) (HCC)    Chronic systolic heart failure (HCC)    Allen. EF 30-35% in 04/2011, improved to 55-60% in 01/2012.   COPD (chronic obstructive pulmonary disease) (HCC)    Diabetes mellitus    GERD (gastroesophageal reflux disease)    History of pneumonia    Hyperkalemia    Allen. 12/2012 - spironolactone, KCl supp discontinued.   Hyperlipidemia    Hypertension    Hypertriglyceridemia    Allen. 12/2012 -  started on fenofibrate.   NICM (nonischemic cardiomyopathy) ([place])    Allen. Echo 3/13: mild LVH, EF 30-35%, grade 3 diast dysfxn, mod LAE;  RHC 4/13:  RA 8, RV 58/10, PA 56/18, mean 35, PCWP mean 17, LV 154/27, CO 4.3, CI 2.0. b. EF Improved >>> Echo (01/2012):  Mild LVH, EF 55-60%, no RWMA, Gr 1 DD   Past Surgical History:  Procedure Laterality Date   CARDIAC CATHETERIZATION  2013   non-obs dz 25-40% multiple vessels   TENOTOMY  2018   Family History  Problem Relation Age of Onset   Diabetes Sister    Breast cancer Mother    Breast cancer Maternal Grandmother    Social History   Tobacco Use   Smoking status: Former    Packs/day: 1.00    Years: 20.00    Pack years: 20.00    Types: Cigarettes    Quit date: 09/15/2006    Years since quitting: 14.3   Smokeless tobacco: Never  Substance Use Topics   Alcohol use: Not Currently    Comment: occ   Allergies  Allergen Reactions   Metformin And Related Other (See Comments)    Due to CHF and CRI   Prior to Admission medications   Medication Sig Start Date End Date Taking? Authorizing Provider  ACCU-CHEK FASTCLIX LANCETS MISC USE TO MONITOR BLOOD GLUCOSE 3 TIME(S) DAILY 05/05/17  Yes [provider]  amLODipine (NORVASC) 10 MG  tablet Take 10 mg by mouth daily.   Yes [provider]  aspirin EC 81 MG EC tablet Take 1 tablet (81 mg total) by mouth daily. 05/02/19  Yes Vann, Jessica U, DO  atorvastatin (LIPITOR) 20 MG tablet Take 1 tablet (20 mg total) by mouth daily. 01/13/21  Yes Roberto Allen, Roberto Fanny, MD  BD PEN NEEDLE NANO U/F 32G X 4 MM MISC Inject 1 Units into the skin as needed. 11/05/19  Yes Elby Beck, FNP  Blood Glucose Monitoring Suppl (ONETOUCH VERIO REFLECT) w/Device KIT 1 Units by Does not apply route as needed. 11/28/20  Yes Roberto Allen, Roberto Fanny, MD  carvedilol (COREG) 25 MG tablet Take 1 tablet (25 mg total) by mouth 2 (two) times daily with Allen meal. 01/13/21  Yes Roberto Allen, Roberto Fanny, MD  Cholecalciferol (VITAMIN D3) 50  MCG (2000 UT) TABS Take 1 tablet by mouth daily. 07/25/19  Yes [provider]  ergocalciferol (VITAMIN D2) 1.25 MG (50000 UT) capsule Take 50,000 Units by mouth once Allen week.   Yes [provider]  glucose blood (ONETOUCH VERIO) test strip Use to check blood sugar 2 times Allen day 12/24/20  Yes Roberto Allen, Roberto Fanny, MD  HUMALOG KWIKPEN 100 UNIT/ML KwikPen SMARTSIG:15 Unit(s) SUB-Q 3 Times Daily PRN 06/19/19  Yes [provider]  hydrALAZINE (APRESOLINE) 50 MG tablet Take 1 tablet (50 mg total) by mouth 3 (three) times daily. 12/30/20 03/30/21 Yes Monge, Helane Gunther, NP  isosorbide mononitrate (IMDUR) 60 MG 24 hr tablet Take 1 tablet (60 mg total) by mouth 2 (two) times daily. 01/13/21  Yes Roberto Allen, Roberto Fanny, MD  LANTUS SOLOSTAR 100 UNIT/ML Solostar Pen Inject 50 Units into the skin at bedtime. 06/16/20  Yes Roberto Allen, Roberto Fanny, MD  magnesium oxide (MAG-OX) 400 (241.3 Mg) MG tablet Take 1 tablet (400 mg total) by mouth daily. 04/01/20  Yes Roberto Allen, Roberto Fanny, MD  omeprazole (PRILOSEC) 20 MG capsule Take 1 capsule (20 mg total) by mouth daily. 12/22/20  Yes Roberto Allen, Roberto Fanny, MD  OZEMPIC, 1 MG/DOSE, 4 MG/3ML SOPN INJECT 1 MG UNDER SKIN ONCE Allen WEEK 11/26/20  Yes Roberto Allen, Roberto A, MD  tadalafil (CIALIS) 20 MG tablet TAKE ONE TABLET BY MOUTH DAILY AS NEEDED FOR ERECTILE DYSFUNCTION 11/28/20  Yes Roberto Allen, Roberto Fanny, MD  torsemide (DEMADEX) 20 MG tablet Take 1 tablet (20 mg total) by mouth daily. 01/13/21  Yes Roberto Allen, Roberto Fanny, MD  potassium chloride (KLOR-CON) 20 MEQ packet Take 20 mEq by mouth 2 (two) times daily.  04/23/11  [provider]    Review of Systems  Constitutional:  Positive for fatigue. Negative for appetite change.  HENT:  Negative for congestion, postnasal drip and sore throat.   Eyes: Negative.   Respiratory:  Positive for shortness of breath. Negative for cough and chest tightness.        + snoring  Cardiovascular:  Positive for leg swelling (worsening). Negative for chest pain and  palpitations.  Gastrointestinal:  Positive for abdominal distention. Negative for abdominal pain.  Endocrine: Negative.   Genitourinary: Negative.   Musculoskeletal:  Negative for back pain and neck pain.  Skin: Negative.   Allergic/Immunologic: Negative.   Neurological:  Negative for dizziness and light-headedness.  Hematological:  Negative for adenopathy. Does not bruise/bleed easily.  Psychiatric/Behavioral:  Negative for dysphoric mood and sleep disturbance (sleeping on 2 pillows). The patient is not nervous/anxious.    Vitals:   01/29/21 0848  BP: 135/63  Pulse: 81  Resp: 18  SpO2:  98%  Weight: 244 lb 8 oz (110.9 kg)  Height: _0  (1.803 m)   Wt Readings from Last 3 Encounters:  01/29/21 244 lb 8 oz (110.9 kg)  01/16/21 245 lb 12.8 oz (111.5 kg)  01/07/21 243 lb 8 oz (110.5 kg)   Lab Results  Component Value Date   CREATININE 2.82 (H) 01/16/2021   CREATININE 3.22 (H) 01/06/2021   CREATININE 2.79 (H) 12/30/2020   Physical Exam Vitals and nursing note reviewed. Exam conducted with Allen chaperone present (wife).  Constitutional:      Appearance: He is well-developed.  HENT:     Head: Normocephalic and atraumatic.  Cardiovascular:     Rate and Rhythm: Normal rate and regular rhythm.  Pulmonary:     Effort: Pulmonary effort is normal. No respiratory distress.     Breath sounds: No wheezing, rhonchi or rales.  Abdominal:     Palpations: Abdomen is soft.     Tenderness: There is no abdominal tenderness.  Musculoskeletal:     Right lower leg: No tenderness. Edema (2+ pitting) present.     Left lower leg: No tenderness. Edema (1+ pitting) present.  Skin:    General: Skin is warm and dry.  Neurological:     General: No focal deficit present.     Mental Status: He is alert and oriented to person, place, and time.  Psychiatric:        Mood and Affect: Mood normal.        Behavior: Behavior normal.   Assessment & Plan:  1: Chronic heart failure with reduced ejection  fraction- - NYHA class II - euvolemic today - weighing daily; instructed to call for an overnight weight gain of > 2 pounds or Allen weekly weight gain of > 5 pounds - weight up 2 pounds from last visit here 1 month ago - not adding salt and his wife is not cooking with salt either - saw cardiology Roberto Allen) 01/16/21 - recently took torsemide 44m daily for 3 days due to increasing edema; will check BMP today - on GDMT of carvedilol - renal function does not allow for other GDMT although could consider jardiance again - BNP 12/15/20 was 501.5  2: HTN with CKD- - BP looks good (135/63) although stopping amlodipine today to see if that's contributing to his edema - home BP's are elevated at times so with stopping amlodipine, will increase his hydralazine to 1053mTID; he can finish his current bottle by taking 2 tablets TID until gone - saw PCP (Roberto Allen) 01/06/21 - BMP 01/16/21 reviewed and showed sodium 144, potassium 4.0, creatinine 2.82 & GFR 25 - sees nephrology 02/02/21  3: DM- - A1c 11/28/20 was 14.7% - glucose was 119 at home this morning  4: Lymphedema- - stage 2 - unable to wear compression socks because they hurt his legs - encouraged to elevate his legs when he's sitting for long periods of time; he tends to sit with his legs down - back at work so is quite active walking - will make referral for compression boots; can try ACE wraps on his legs in the interim   Medication list reviewed.   Return in 1 month or sooner for any questions/problems before then.

## 2021-01-29 NOTE — Telephone Encounter (Addendum)
Message below with lab results and information on potassium RX given to patient over phone. Patient verbalized understanding and stated he will pick up the potassium as soon as he's notified it's ready. He had no questions or concerns at this time.  Georg Ruddle, RN ----- Message from Alisa Graff, [place] sent at 01/29/2021  1:00 PM EST ----- Kidney function slightly worse since you took the 3 days of extra fluid pill but not horribly worse. Potassium level is a little low so sent in RX for potassium to take 1 tablet every morning just for 3 days. Will recheck labs at next visit in January unless done elsewhere.

## 2021-01-29 NOTE — Progress Notes (Signed)
Potassium daily for 3 days

## 2021-02-02 ENCOUNTER — Encounter: Payer: Self-pay | Admitting: Family Medicine

## 2021-02-02 ENCOUNTER — Other Ambulatory Visit: Payer: Self-pay | Admitting: Family Medicine

## 2021-02-03 ENCOUNTER — Other Ambulatory Visit: Payer: Self-pay

## 2021-02-03 ENCOUNTER — Ambulatory Visit (INDEPENDENT_AMBULATORY_CARE_PROVIDER_SITE_OTHER): Payer: 59 | Admitting: Family Medicine

## 2021-02-03 ENCOUNTER — Encounter: Payer: Self-pay | Admitting: Family Medicine

## 2021-02-03 VITALS — BP 136/78 | HR 87 | Temp 98.3°F | Ht 71.0 in | Wt 245.2 lb

## 2021-02-03 DIAGNOSIS — I5033 Acute on chronic diastolic (congestive) heart failure: Secondary | ICD-10-CM | POA: Diagnosis not present

## 2021-02-03 DIAGNOSIS — K219 Gastro-esophageal reflux disease without esophagitis: Secondary | ICD-10-CM

## 2021-02-03 DIAGNOSIS — I151 Hypertension secondary to other renal disorders: Secondary | ICD-10-CM

## 2021-02-03 DIAGNOSIS — R600 Localized edema: Secondary | ICD-10-CM

## 2021-02-03 DIAGNOSIS — N2889 Other specified disorders of kidney and ureter: Secondary | ICD-10-CM

## 2021-02-03 DIAGNOSIS — N1832 Chronic kidney disease, stage 3b: Secondary | ICD-10-CM

## 2021-02-03 DIAGNOSIS — E1122 Type 2 diabetes mellitus with diabetic chronic kidney disease: Secondary | ICD-10-CM | POA: Diagnosis not present

## 2021-02-03 DIAGNOSIS — N183 Chronic kidney disease, stage 3 unspecified: Secondary | ICD-10-CM

## 2021-02-03 DIAGNOSIS — Z794 Long term (current) use of insulin: Secondary | ICD-10-CM

## 2021-02-03 NOTE — Assessment & Plan Note (Signed)
Suspect multifactorial  CHF  Vasodilator side effect  Venous insufficiency  CKD  Watching sodium  Continues torsemide and newly added aldactone Per pt cardiology ordered some compression ? Boots but he is waiting  Pt cannot tolerate supp hose at all  He states movement/walking is painful causing mobility impairment  Another work note written for 1 month  Hope to be able to get this controlled so he can return to work  [employer] health referral done to see if a Armed forces operational officer would help organize care

## 2021-02-03 NOTE — Assessment & Plan Note (Signed)
Currently control is ok with  Amlodipine 10 mg daily  Hydralazine 100 mg tid Imdur 60 mg bid  Just added aldactone (from nephology) 25 mg daily   And taking coreg 12.5 mg bid (recently cut dose)  Torsemide 20 mg daily  For CHF BP: 136/78

## 2021-02-03 NOTE — Assessment & Plan Note (Signed)
Blood sugars are stable at home Has endocrinology appt Dr Hartford Poli in early feb  May be able to resume jardiance if renal status stabilizes

## 2021-02-03 NOTE — Assessment & Plan Note (Signed)
Back with Dr Marland Kitchen for nephrology  Recently added aldactone and cut dose of coreg  This may help swelling  Lab for bmp and uric acid were planned there in 6 wk

## 2021-02-03 NOTE — Assessment & Plan Note (Signed)
Pt's insurance would not pay for omeprazole 20 mg bid  He is taking daily -some rebound  Optimally would like to wean this in light of CKD Will use antacids prn and alert [place] if not significantly improved in 2 weeks

## 2021-02-03 NOTE — Patient Instructions (Addendum)
Continue to watch your diet for sugar and salt  Follow up with Dr Hartford Poli for diabetes and Dr Marland Kitchen for kidney care as planned  Continue daily weights and blood pressure monitoring  Continue current medicines   Keep [place] posted  I would like to keep you out of work another month   Try the omeprazole once daily instead of twice daily  If no improvement in the initial heartburn in 2 weeks let us know and we will need to do a prior authorization to see if we can get it covered   I plan to place a community health referral to see if we can coordinate your care  You will get a call about that   Please follow up with [place] in about a month

## 2021-02-03 NOTE — Progress Notes (Signed)
Subjective:    Patient ID: Roberto Allen, male    DOB: [birth year], 60 y.o.   MRN: [id number]  This visit occurred during the SARS-CoV-2 public health emergency.  Safety protocols were in place, including screening questions prior to the visit, additional usage of staff PPE, and extensive cleaning of exam room while observing appropriate contact time as indicated for disinfecting solutions.   HPI Pt presents for f/u of pedal edema and renal insufficiency (in the setting of CHF and DM2)  Wt Readings from Last 3 Encounters:  02/03/21 245 lb 4 oz (111.2 kg)  01/29/21 244 lb 8 oz (110.9 kg)  01/16/21 245 lb 12.8 oz (111.5 kg)   34.21 kg/m Weights have been stable   Tried to go back to work  His legs swell too severely and it hurts and he cannot tolerate it  He cannot tolerate support hose   Cardiology did order him some compression boots  ? If all the swelling is from heart failure     Saw renal- Dr Marland Kitchen / [place] (used to see him)  Needs to hold off going to work  Renal function is worse  Did some medication changes   He takes omeprazole bid -it was not approved for qd instead of bid    CHF Sob is about the same   Torsemide 20 mg daily  Aldactone 25 mg daily - nephrology ordered this   (this am was the first dose) - to see if this helps the torsemide works better  Coreg 12.5 mg bid (cut from 25 bid)   Planning bmp and uric acid in 6 weeks and f/u in 3 month   Avoiding salt and avoiding sodas   Takes klor con 20 meq daily  HTN  BP Readings from Last 3 Encounters:  02/03/21 136/78  01/29/21 135/63  01/16/21 130/78   Pulse Readings from Last 3 Encounters:  02/03/21 87  01/29/21 81  01/16/21 84    amlodipine 10 mg daily  Hydralazine 100 mg tid  Imdur 60 mg bid    Upcoming appts Dr Holley Raring- 1/23 - will not go/not switch to  Dr Hartford Poli 2/23  Will need to watch level with aldactone   DM2-blood sugars are stable in 130s  May be able to go back on  Jardiance if renal function stabilized   Patient Active Problem List   Diagnosis Date Noted   Pedal edema 02/03/2021   Dilated cardiomyopathy ([place]) 12/19/2020   CHF exacerbation ([place]) 12/16/2020   Acute on chronic diastolic CHF (congestive heart failure) ([place]) 12/16/2020   Hypokalemia 12/16/2020   CHF (congestive heart failure) ([place]) 04/28/2019   Hypertension secondary to other renal disorders 11/29/2016   GERD (gastroesophageal reflux disease) 07/23/2016   Degenerative disc disease at L5-S1 level 04/09/2016   Primary osteoarthritis of right hip 04/09/2016   Obesity, Class I, BMI 30-34.9 02/06/2016   Ulcer of left foot ([place]) 01/06/2015   Microalbuminuria due to type 2 diabetes mellitus ([place]) 10/03/2014   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 diabetes mellitus ([place]) 06/26/2014   NICM (nonischemic cardiomyopathy) ([place]) 09/06/2013   ED (erectile dysfunction) 03/27/2013   Disorder of kidney and ureter 12/17/2012   DM (diabetes mellitus), type 2 with renal complications ([place]) 69/62/9528   Coronary Artery Disease (non-obstructive by cath 05/2011) 06/14/2011   Hyperlipidemia associated with type 2 diabetes mellitus ([place]) 06/14/2011   OSA (obstructive sleep apnea) 05/25/2011   Hypertension    Past Medical History:  Diagnosis Date  CAD (coronary artery disease)    a. LHC 4/13: mD1 25%, oD2 25%, pOM1 25%, pRCA 25%, dRCA 40%, EF 30%   CHF (congestive heart failure) (HCC)    Chronic kidney disease (CKD), stage III (moderate) (HCC)    Chronic systolic heart failure (HCC)    a. EF 30-35% in 04/2011, improved to 55-60% in 01/2012.   COPD (chronic obstructive pulmonary disease) (HCC)    Diabetes mellitus    GERD (gastroesophageal reflux disease)    History of pneumonia    Hyperkalemia    a. 12/2012 - spironolactone, KCl supp discontinued.   Hyperlipidemia    Hypertension    Hypertriglyceridemia    a. 12/2012 - started on fenofibrate.   NICM (nonischemic cardiomyopathy) ([place])    a. Echo 3/13: mild  LVH, EF 30-35%, grade 3 diast dysfxn, mod LAE;  RHC 4/13:  RA 8, RV 58/10, PA 56/18, mean 35, PCWP mean 17, LV 154/27, CO 4.3, CI 2.0. b. EF Improved >>> Echo (01/2012):  Mild LVH, EF 55-60%, no RWMA, Gr 1 DD   Past Surgical History:  Procedure Laterality Date   CARDIAC CATHETERIZATION  2013   non-obs dz 25-40% multiple vessels   TENOTOMY  2018   Social History   Tobacco Use   Smoking status: Former    Packs/day: 1.00    Years: 20.00    Pack years: 20.00    Types: Cigarettes    Quit date: 09/15/2006    Years since quitting: 14.3   Smokeless tobacco: Never  Vaping Use   Vaping Use: Never used  Substance Use Topics   Alcohol use: Not Currently    Comment: occ   Drug use: No   Family History  Problem Relation Age of Onset   Diabetes Sister    Breast cancer Mother    Breast cancer Maternal Grandmother    Allergies  Allergen Reactions   Metformin And Related Other (See Comments)    Due to CHF and CRI   Current Outpatient Medications on File Prior to Visit  Medication Sig Dispense Refill   ACCU-CHEK FASTCLIX LANCETS MISC USE TO MONITOR BLOOD GLUCOSE 3 TIME(S) DAILY  5   amLODipine (NORVASC) 10 MG tablet Take 10 mg by mouth daily.     aspirin EC 81 MG EC tablet Take 1 tablet (81 mg total) by mouth daily.     atorvastatin (LIPITOR) 20 MG tablet Take 1 tablet (20 mg total) by mouth daily. 90 tablet 1   BD PEN NEEDLE NANO U/F 32G X 4 MM MISC Inject 1 Units into the skin as needed. 100 each 3   Blood Glucose Monitoring Suppl (ONETOUCH VERIO REFLECT) w/Device KIT 1 Units by Does not apply route as needed. 1 kit 0   carvedilol (COREG) 12.5 MG tablet Take 12.5 mg by mouth 2 (two) times daily.     Cholecalciferol (VITAMIN D3) 50 MCG (2000 UT) TABS Take 1 tablet by mouth daily.     ergocalciferol (VITAMIN D2) 1.25 MG (50000 UT) capsule Take 50,000 Units by mouth once a week.     glucose blood (ONETOUCH VERIO) test strip Use to check blood sugar 2 times a day 100 each 5   HUMALOG  KWIKPEN 100 UNIT/ML KwikPen SMARTSIG:15 Unit(s) SUB-Q 3 Times Daily PRN     hydrALAZINE (APRESOLINE) 100 MG tablet Take 1 tablet (100 mg total) by mouth 3 (three) times daily. 270 tablet 3   isosorbide mononitrate (IMDUR) 60 MG 24 hr tablet Take 1 tablet (60 mg total)  by mouth 2 (two) times daily. 180 tablet 1   LANTUS SOLOSTAR 100 UNIT/ML Solostar Pen Inject 50 Units into the skin at bedtime. 15 mL 5   magnesium oxide (MAG-OX) 400 (241.3 Mg) MG tablet Take 1 tablet (400 mg total) by mouth daily. 90 tablet 3   omeprazole (PRILOSEC) 20 MG capsule Take 1 capsule (20 mg total) by mouth daily. 90 capsule 3   OZEMPIC, 1 MG/DOSE, 4 MG/3ML SOPN INJECT 1 MG UNDER SKIN ONCE A WEEK 3 mL 0   spironolactone (ALDACTONE) 25 MG tablet Take 25 mg by mouth daily.     tadalafil (CIALIS) 20 MG tablet TAKE ONE TABLET BY MOUTH DAILY AS NEEDED FOR ERECTILE DYSFUNCTION 30 tablet 5   torsemide (DEMADEX) 20 MG tablet Take 1 tablet (20 mg total) by mouth daily. 90 tablet 1   potassium chloride SA (KLOR-CON M) 20 MEQ tablet Take 1 tablet (20 mEq total) by mouth daily for 3 days. 3 tablet 0   [DISCONTINUED] potassium chloride (KLOR-CON) 20 MEQ packet Take 20 mEq by mouth 2 (two) times daily.     No current facility-administered medications on file prior to visit.     Review of Systems  Constitutional:  Positive for fatigue. Negative for activity change, appetite change, fever and unexpected weight change.  HENT:  Negative for congestion, rhinorrhea, sore throat and trouble swallowing.   Eyes:  Negative for pain, redness, itching and visual disturbance.  Respiratory:  Positive for shortness of breath. Negative for cough, chest tightness and wheezing.   Cardiovascular:  Positive for leg swelling. Negative for chest pain and palpitations.  Gastrointestinal:  Negative for abdominal pain, blood in stool, constipation, diarrhea and nausea.  Endocrine: Negative for cold intolerance, heat intolerance, polydipsia and polyuria.   Genitourinary:  Negative for difficulty urinating, dysuria, frequency and urgency.  Musculoskeletal:  Negative for arthralgias, joint swelling and myalgias.       Leg pain from pedal edema   Skin:  Negative for pallor and rash.  Neurological:  Negative for dizziness, tremors, weakness, numbness and headaches.  Hematological:  Negative for adenopathy. Does not bruise/bleed easily.  Psychiatric/Behavioral:  Negative for decreased concentration and dysphoric mood. The patient is not nervous/anxious.       Objective:   Physical Exam Constitutional:      General: He is not in acute distress.    Appearance: Normal appearance. He is well-developed. He is obese. He is not ill-appearing or diaphoretic.  HENT:     Head: Normocephalic and atraumatic.  Eyes:     Conjunctiva/sclera: Conjunctivae normal.     Pupils: Pupils are equal, round, and reactive to light.  Neck:     Thyroid: No thyromegaly.     Vascular: No carotid bruit or JVD.  Cardiovascular:     Rate and Rhythm: Normal rate and regular rhythm.     Heart sounds: Normal heart sounds.    No gallop.  Pulmonary:     Effort: Pulmonary effort is normal. No respiratory distress.     Breath sounds: Normal breath sounds. No wheezing or rales.     Comments: No crackles  Breath sounds are distant Abdominal:     General: Bowel sounds are normal. There is no distension or abdominal bruit.     Palpations: Abdomen is soft. There is no mass.     Tenderness: There is no abdominal tenderness.  Musculoskeletal:     Cervical back: Normal range of motion and neck supple.     Right lower leg:  Edema present.     Left lower leg: Edema present.     Comments: 1-2 plus pitting edema to knees No erythema or skin breakdown    Lymphadenopathy:     Cervical: No cervical adenopathy.  Skin:    General: Skin is warm and dry.     Coloration: Skin is not pale.     Findings: No rash.  Neurological:     Mental Status: He is alert.     Sensory: No sensory  deficit.     Motor: No weakness.     Coordination: Coordination normal.     Deep Tendon Reflexes: Reflexes are normal and symmetric. Reflexes normal.  Psychiatric:        Mood and Affect: Mood normal.          Assessment & Plan:   Problem List Items Addressed This Visit       Cardiovascular and Mediastinum   Hypertension secondary to other renal disorders    Currently control is ok with  Amlodipine 10 mg daily  Hydralazine 100 mg tid Imdur 60 mg bid  Just added aldactone (from nephology) 25 mg daily   And taking coreg 12.5 mg bid (recently cut dose)  Torsemide 20 mg daily  For CHF BP: 136/78        Relevant Medications   spironolactone (ALDACTONE) 25 MG tablet   carvedilol (COREG) 12.5 MG tablet   Other Relevant Orders   AMB Referral to Community Care Coordinaton   CHF (congestive heart failure) (HCC)    Pt notes wt is stable and he is still quite exercise intolerant Taking torsemide 20 mg daily  Aldactone was just added 25 mg from nephrology  Has close f/u  Avoiding sodium and sodas  Labs pending for cardiology and nephrology Doubt that all of his pedal edema is from CHF      Relevant Medications   spironolactone (ALDACTONE) 25 MG tablet   carvedilol (COREG) 12.5 MG tablet   Other Relevant Orders   AMB Referral to Community Care Coordinaton     Digestive   GERD (gastroesophageal reflux disease)    Pt's insurance would not pay for omeprazole 20 mg bid  He is taking daily -some rebound  Optimally would like to wean this in light of CKD Will use antacids prn and alert [place] if not significantly improved in 2 weeks        Endocrine   DM (diabetes mellitus), type 2 with renal complications ([place])    Blood sugars are stable at home Has endocrinology appt Dr Hartford Poli in early feb  May be able to resume jardiance if renal status stabilizes      Relevant Orders   AMB Referral to [place]   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 diabetes mellitus ([place]) -  Primary    Back with Dr Marland Kitchen for nephrology  Recently added aldactone and cut dose of coreg  This may help swelling  Lab for bmp and uric acid were planned there in 6 wk        Relevant Orders   AMB Referral to [place]     Other   Pedal edema    Suspect multifactorial  CHF  Vasodilator side effect  Venous insufficiency  CKD  Watching sodium  Continues torsemide and newly added aldactone Per pt cardiology ordered some compression ? Boots but he is waiting  Pt cannot tolerate supp hose at all  He states movement/walking is painful causing mobility impairment  Another work note written for 1 month  Hope to be able to get this controlled so he can return to work  [employer] referral done to see if a Armed forces operational officer would help organize care

## 2021-02-03 NOTE — Assessment & Plan Note (Signed)
Pt notes wt is stable and he is still quite exercise intolerant Taking torsemide 20 mg daily  Aldactone was just added 25 mg from nephrology  Has close f/u  Avoiding sodium and sodas  Labs pending for cardiology and nephrology Doubt that all of his pedal edema is from CHF

## 2021-02-04 ENCOUNTER — Other Ambulatory Visit: Payer: Self-pay | Admitting: Family

## 2021-02-04 ENCOUNTER — Other Ambulatory Visit: Payer: Self-pay | Admitting: Family Medicine

## 2021-02-04 ENCOUNTER — Telehealth: Payer: Self-pay

## 2021-02-04 NOTE — Telephone Encounter (Signed)
Spoke with patient over phone to schedule a time for patient to come to [facility] for lab work next week after beginning spironolactone. Patient stated he will be out of town until January 1st and scheduled lab work for February 17, 2021 at 8 AM. Provider, Darylene Price, notified.  Georg Ruddle, RN

## 2021-02-10 ENCOUNTER — Encounter: Payer: Self-pay | Admitting: Podiatry

## 2021-02-10 ENCOUNTER — Other Ambulatory Visit: Payer: Self-pay | Admitting: Podiatry

## 2021-02-10 MED ORDER — DOXYCYCLINE HYCLATE 100 MG PO TABS: 100.0000 mg | ORAL_TABLET | Freq: Two times a day (BID) | ORAL | 0 refills | Status: DC

## 2021-02-17 ENCOUNTER — Telehealth: Payer: Self-pay

## 2021-02-17 ENCOUNTER — Other Ambulatory Visit
Admission: RE | Admit: 2021-02-17 | Discharge: 2021-02-17 | Disposition: A | Discharge status: Home / Self Care | Payer: 59 | Source: Ambulatory Visit | Attending: Family | Admitting: Family

## 2021-02-17 ENCOUNTER — Telehealth: Payer: Self-pay | Admitting: Family Medicine

## 2021-02-17 ENCOUNTER — Telehealth: Payer: Self-pay | Admitting: *Deleted

## 2021-02-17 ENCOUNTER — Other Ambulatory Visit: Payer: Self-pay

## 2021-02-17 DIAGNOSIS — I5042 Chronic combined systolic (congestive) and diastolic (congestive) heart failure: Secondary | ICD-10-CM | POA: Insufficient documentation

## 2021-02-17 LAB — BASIC METABOLIC PANEL
Anion gap: 9 (ref 5–15)
BUN: 42 mg/dL — ABNORMAL HIGH (ref 8–23)
CO2: 25 mmol/L (ref 22–32)
Calcium: 9.1 mg/dL (ref 8.9–10.3)
Chloride: 102 mmol/L (ref 98–111)
Creatinine, Ser: 2.98 mg/dL — ABNORMAL HIGH (ref 0.61–1.24)
GFR, Estimated: 23 mL/min — ABNORMAL LOW (ref >60)
Glucose, Bld: 188 mg/dL — ABNORMAL HIGH (ref 70–99)
Potassium: 4.2 mmol/L (ref 3.5–5.1)
Sodium: 136 mmol/L (ref 135–145)

## 2021-02-17 NOTE — Telephone Encounter (Addendum)
Message with lab results below called to patient. Patient acknowledged and stated he just saw his kidney doctor and will see them again in March. He had no further questions or concerns at this time. Georg Ruddle, RN  ----- Message from Alisa Graff, [place] sent at 02/17/2021  1:25 PM EST ----- Potassium level is now normal. Kidney function is stable. Continue medications as well as follow-up with kidney doctor.

## 2021-02-17 NOTE — Chronic Care Management (AMB) (Signed)
°  Care Management   Note  02/17/2021 Name: Roberto Allen MRN: [id number] DOB: 05/10/1960  Roberto Allen is a 61 y.o. year old male who is a primary care patient of Tower, Wynelle Fanny, MD. I reached out to [name] by phone today in response to a referral sent by Roberto Allen primary care provider.   Roberto Allen was given information about care management services today including:  Care management services include personalized support from designated clinical staff supervised by his physician, including individualized plan of care and coordination with other care providers 24/7 contact phone numbers for assistance for urgent and routine care needs. The patient may stop care management services at any time by phone call to the office staff.  Patient agreed to services and verbal consent obtained.   Follow up plan: Telephone appointment with care management team member scheduled for: 02/26/2021  Roberto Allen, [place] Management  Direct Dial: [phone]

## 2021-02-17 NOTE — Telephone Encounter (Signed)
Form in your inbox 

## 2021-02-17 NOTE — Telephone Encounter (Signed)
Pt dropped off paperwork  Type of forms received:not sure  Routed [id number]  Paperwork received by : terrill   Individual made aware of 3-5 business day turn around (Y/N):y  Form completed and patient made aware of charges(Y/N):y   Faxed to :   Form location:  dr Occupational psychologist

## 2021-02-18 ENCOUNTER — Encounter: Payer: Self-pay | Admitting: Podiatry

## 2021-02-22 NOTE — Telephone Encounter (Signed)
Done and in IN box 

## 2021-02-23 ENCOUNTER — Encounter: Payer: Self-pay | Admitting: Podiatry

## 2021-02-23 NOTE — Telephone Encounter (Signed)
Please advise 

## 2021-02-23 NOTE — Telephone Encounter (Signed)
Forms faxed and left VM letting pt know and asked how he wanted to pick up a copy  ** if pt calls back please see if he wants me to mail him a copy or place at front for pick up**

## 2021-02-24 ENCOUNTER — Encounter: Payer: Self-pay | Admitting: Family Medicine

## 2021-02-24 ENCOUNTER — Encounter: Payer: Self-pay | Admitting: Podiatry

## 2021-02-25 ENCOUNTER — Telehealth: Payer: Self-pay | Admitting: *Deleted

## 2021-02-25 ENCOUNTER — Telehealth: Payer: 59

## 2021-02-25 NOTE — Telephone Encounter (Signed)
°  Care Management   Follow Up Note   02/25/2021 Name: Roberto Allen MRN: [id number] DOB: [birth year]   Referred by: Tower, Wynelle Fanny, MD Reason for referral : Chronic Care Management (CAD, DM, CHF)   An unsuccessful telephone outreach was attempted today. The patient was referred to the case management team for assistance with care management and care coordination.   Follow Up Plan: Telephone follow up appointment with care management team member scheduled for: upon care guide rescheduling.  Jacqlyn Larsen [facility], BSN RN Case Manager [phone]

## 2021-02-25 NOTE — Telephone Encounter (Signed)
Erroneous encounter  Jacqlyn Larsen [facility], BSN RN Case Manager [phone]

## 2021-02-26 ENCOUNTER — Telehealth: Payer: Self-pay | Admitting: *Deleted

## 2021-02-26 ENCOUNTER — Telehealth: Payer: 59

## 2021-02-26 NOTE — Chronic Care Management (AMB) (Signed)
°  Care Management   Outreach Note  02/26/2021 Name: Roberto Allen MRN: [id number] DOB: [birth year]  Referred by: Tower, Wynelle Fanny, MD Reason for referral : Care Coordination (Outreach to reschedule initial with [facility] )   An unsuccessful telephone outreach was attempted today. The patient was referred to the case management team for assistance with care management and care coordination.   Follow Up Plan:  A HIPAA compliant phone message was left for the patient providing contact information and requesting a return call.  If patient returns call to provider office, please advise to call Embedded Care Management Care Guide Jessia Kief at [place], [place] Management  Direct Dial: [phone]

## 2021-03-02 ENCOUNTER — Other Ambulatory Visit: Payer: Self-pay

## 2021-03-02 ENCOUNTER — Telehealth: Payer: Self-pay

## 2021-03-02 ENCOUNTER — Ambulatory Visit (INDEPENDENT_AMBULATORY_CARE_PROVIDER_SITE_OTHER): Payer: 59 | Admitting: Podiatry

## 2021-03-02 ENCOUNTER — Encounter: Payer: Self-pay | Admitting: Family

## 2021-03-02 ENCOUNTER — Ambulatory Visit (INDEPENDENT_AMBULATORY_CARE_PROVIDER_SITE_OTHER): Payer: 59

## 2021-03-02 ENCOUNTER — Encounter: Payer: Self-pay | Admitting: Podiatry

## 2021-03-02 ENCOUNTER — Ambulatory Visit: Payer: 59 | Attending: Family | Admitting: Family

## 2021-03-02 VITALS — BP 126/74 | HR 72 | Resp 18 | Ht 71.0 in | Wt 240.1 lb

## 2021-03-02 DIAGNOSIS — I5022 Chronic systolic (congestive) heart failure: Secondary | ICD-10-CM | POA: Diagnosis not present

## 2021-03-02 DIAGNOSIS — I2510 Atherosclerotic heart disease of native coronary artery without angina pectoris: Secondary | ICD-10-CM | POA: Insufficient documentation

## 2021-03-02 DIAGNOSIS — Z87891 Personal history of nicotine dependence: Secondary | ICD-10-CM | POA: Diagnosis not present

## 2021-03-02 DIAGNOSIS — E08621 Diabetes mellitus due to underlying condition with foot ulcer: Secondary | ICD-10-CM

## 2021-03-02 DIAGNOSIS — K219 Gastro-esophageal reflux disease without esophagitis: Secondary | ICD-10-CM | POA: Insufficient documentation

## 2021-03-02 DIAGNOSIS — E1122 Type 2 diabetes mellitus with diabetic chronic kidney disease: Secondary | ICD-10-CM

## 2021-03-02 DIAGNOSIS — I10 Essential (primary) hypertension: Secondary | ICD-10-CM

## 2021-03-02 DIAGNOSIS — E875 Hyperkalemia: Secondary | ICD-10-CM | POA: Insufficient documentation

## 2021-03-02 DIAGNOSIS — I890 Lymphedema, not elsewhere classified: Secondary | ICD-10-CM | POA: Diagnosis not present

## 2021-03-02 DIAGNOSIS — L97512 Non-pressure chronic ulcer of other part of right foot with fat layer exposed: Secondary | ICD-10-CM | POA: Diagnosis not present

## 2021-03-02 DIAGNOSIS — E785 Hyperlipidemia, unspecified: Secondary | ICD-10-CM | POA: Diagnosis not present

## 2021-03-02 DIAGNOSIS — R0683 Snoring: Secondary | ICD-10-CM

## 2021-03-02 DIAGNOSIS — G4730 Sleep apnea, unspecified: Secondary | ICD-10-CM | POA: Insufficient documentation

## 2021-03-02 DIAGNOSIS — I130 Hypertensive heart and chronic kidney disease with heart failure and stage 1 through stage 4 chronic kidney disease, or unspecified chronic kidney disease: Secondary | ICD-10-CM | POA: Insufficient documentation

## 2021-03-02 DIAGNOSIS — E1142 Type 2 diabetes mellitus with diabetic polyneuropathy: Secondary | ICD-10-CM

## 2021-03-02 DIAGNOSIS — Z794 Long term (current) use of insulin: Secondary | ICD-10-CM

## 2021-03-02 DIAGNOSIS — J449 Chronic obstructive pulmonary disease, unspecified: Secondary | ICD-10-CM | POA: Diagnosis not present

## 2021-03-02 DIAGNOSIS — N184 Chronic kidney disease, stage 4 (severe): Secondary | ICD-10-CM

## 2021-03-02 DIAGNOSIS — N189 Chronic kidney disease, unspecified: Secondary | ICD-10-CM | POA: Diagnosis not present

## 2021-03-02 NOTE — Chronic Care Management (AMB) (Signed)
°  Care Management   Note  03/02/2021 Name: Roberto Allen MRN: [id number] DOB: Jul 02, 1960  Roberto Allen is a 61 y.o. year old male who is a primary care patient of Tower, Wynelle Fanny, MD and is actively engaged with the care management team. I reached out to [name] by phone today to assist with re-scheduling an initial visit with the RN Case Manager  Follow up plan: Telephone appointment with care management team member scheduled for: 03/26/2021  Julian Hy, [place], [place] Management  Direct Dial: [phone]

## 2021-03-02 NOTE — Chronic Care Management (AMB) (Signed)
°  Care Management   Outreach Note  03/02/2021 Name: Roberto Allen MRN: [id number] DOB: 12/09/1960  Referred by: Tower, Wynelle Fanny, MD Reason for referral : Care Coordination (Outreach to reschedule initial with RNCM )   A second unsuccessful telephone outreach was attempted today. The patient was referred to the case management team for assistance with care management and care coordination.   Follow Up Plan:  A HIPAA compliant phone message was left for the patient providing contact information and requesting a return call.  If patient returns call to provider office, please advise to call Embedded Care Management Care Guide Joylene Wescott at [place], [place] Management  Direct Dial: [phone]

## 2021-03-02 NOTE — Progress Notes (Signed)
Patient ID: Roberto Allen, male    DOB: October 05, 1960, 61 y.o.   MRN: [id number]  Mr Coffield is a 61 y/o male with a history of CAD, DM, hyperlipidemia, HTN, CKD, COPD, GERD, hyperkalemia, previous tobacco use and chronic heart failure.   Echo report from 12/16/20 reviewed and showed an EF of 40-45% along with moderate LVH,mild LAE and trivial MR.   Admitted 12/16/20 due to shortness of breath and pedal edema. Cardiology consult obtained. Medications adjusted. Discharged after 3 days.   He presents today for his follow-up visit with a chief complaint of minimal shortness of breath upon moderate exertion. He says that this has been present for several years and is considered baseline for him. He denies any difficulty sleeping, dizziness, worsening edema, palpitations, chest pain or cough.   During his last visit to the HF clinic, amlodipine was d/c'd and hydralazine was started to possibly help with pedal edema. He saw his nephrologist in December and spironolactone was started. Since these interventions, his legs are what he considers "normal". He has a sleep study later this week for sleep apnea.   He is feeling much better, anticipating starting back to work at the end of the week.   Past Medical History:  Diagnosis Date   CAD (coronary artery disease)    a. LHC 4/13: mD1 25%, oD2 25%, pOM1 25%, pRCA 25%, dRCA 40%, EF 30%   CHF (congestive heart failure) (HCC)    Chronic kidney disease (CKD), stage III (moderate) (HCC)    Chronic systolic heart failure (HCC)    a. EF 30-35% in 04/2011, improved to 55-60% in 01/2012.   COPD (chronic obstructive pulmonary disease) (HCC)    Diabetes mellitus    GERD (gastroesophageal reflux disease)    History of pneumonia    Hyperkalemia    a. 12/2012 - spironolactone, KCl supp discontinued.   Hyperlipidemia    Hypertension    Hypertriglyceridemia    a. 12/2012 - started on fenofibrate.   NICM (nonischemic cardiomyopathy) ([place])    a. Echo 3/13: mild LVH,  EF 30-35%, grade 3 diast dysfxn, mod LAE;  RHC 4/13:  RA 8, RV 58/10, PA 56/18, mean 35, PCWP mean 17, LV 154/27, CO 4.3, CI 2.0. b. EF Improved >>> Echo (01/2012):  Mild LVH, EF 55-60%, no RWMA, Gr 1 DD   Past Surgical History:  Procedure Laterality Date   CARDIAC CATHETERIZATION  2013   non-obs dz 25-40% multiple vessels   TENOTOMY  2018   Family History  Problem Relation Age of Onset   Diabetes Sister    Breast cancer Mother    Breast cancer Maternal Grandmother    Social History   Tobacco Use   Smoking status: Former    Packs/day: 1.00    Years: 20.00    Pack years: 20.00    Types: Cigarettes    Quit date: 09/15/2006    Years since quitting: 14.4   Smokeless tobacco: Never  Substance Use Topics   Alcohol use: Not Currently    Comment: occ   Allergies  Allergen Reactions   Metformin And Related Other (See Comments)    Due to CHF and CRI   Prior to Admission medications   Medication Sig Start Date End Date Taking? Authorizing Provider  ACCU-CHEK FASTCLIX LANCETS MISC USE TO MONITOR BLOOD GLUCOSE 3 TIME(S) DAILY 05/05/17  Yes [provider]  aspirin EC 81 MG EC tablet Take 1 tablet (81 mg total) by mouth daily. 05/02/19  Yes Eulogio Bear  U, DO  atorvastatin (LIPITOR) 20 MG tablet Take 1 tablet (20 mg total) by mouth daily. 01/13/21  Yes Tower, Wynelle Fanny, MD  BD PEN NEEDLE NANO U/F 32G X 4 MM MISC Inject 1 Units into the skin as needed. 11/05/19  Yes Elby Beck, FNP  Blood Glucose Monitoring Suppl (ONETOUCH VERIO REFLECT) w/Device KIT 1 Units by Does not apply route as needed. 11/28/20  Yes Tower, Wynelle Fanny, MD  carvedilol (COREG) 12.5 MG tablet Take 12.5 mg by mouth 2 (two) times daily. 02/02/21  Yes [provider]  glucose blood (ONETOUCH VERIO) test strip Use to check blood sugar 2 times a day 12/24/20  Yes Tower, Wynelle Fanny, MD  HUMALOG KWIKPEN 100 UNIT/ML KwikPen SMARTSIG:15 Unit(s) SUB-Q 3 Times Daily PRN 06/19/19  Yes [provider]   hydrALAZINE (APRESOLINE) 100 MG tablet Take 1 tablet (100 mg total) by mouth 3 (three) times daily. 01/29/21 04/29/21 Yes Hackney, Otila Kluver A, FNP  isosorbide mononitrate (IMDUR) 60 MG 24 hr tablet Take 1 tablet (60 mg total) by mouth 2 (two) times daily. 01/13/21  Yes Tower, Wynelle Fanny, MD  LANTUS SOLOSTAR 100 UNIT/ML Solostar Pen Inject 50 Units into the skin at bedtime. 06/16/20  Yes Tower, Marne A, MD  magnesium oxide (MAG-OX) 400 (240 Mg) MG tablet TAKE 1 TABLET BY MOUTH EVERY DAY 02/04/21  Yes Tower, Wynelle Fanny, MD  omeprazole (PRILOSEC) 20 MG capsule Take 1 capsule (20 mg total) by mouth daily. 12/22/20  Yes Tower, Wynelle Fanny, MD  OZEMPIC, 1 MG/DOSE, 4 MG/3ML SOPN INJECT 1 MG UNDER SKIN ONCE A WEEK 02/05/21  Yes Tower, Wynelle Fanny, MD  spironolactone (ALDACTONE) 25 MG tablet Take 25 mg by mouth daily. 02/02/21  Yes [provider]  tadalafil (CIALIS) 20 MG tablet TAKE ONE TABLET BY MOUTH DAILY AS NEEDED FOR ERECTILE DYSFUNCTION 11/28/20  Yes Tower, Wynelle Fanny, MD  torsemide (DEMADEX) 20 MG tablet Take 1 tablet (20 mg total) by mouth daily. 01/13/21  Yes Tower, Wynelle Fanny, MD  Cholecalciferol (VITAMIN D3) 50 MCG (2000 UT) TABS Take 1 tablet by mouth daily. Patient not taking: Reported on 03/02/2021 07/25/19   [provider]  doxycycline (VIBRA-TABS) 100 MG tablet Take 1 tablet (100 mg total) by mouth 2 (two) times daily. Patient not taking: Reported on 03/02/2021 02/10/21   [name], DPM  ergocalciferol (VITAMIN D2) 1.25 MG (50000 UT) capsule Take 50,000 Units by mouth once a week. Patient not taking: Reported on 03/02/2021    [provider]   Review of Systems  Constitutional:  Positive for fatigue. Negative for appetite change.  HENT:  Negative for congestion, postnasal drip and sore throat.   Eyes: Negative.   Respiratory:  Positive for shortness of breath (at baseline). Negative for cough and chest tightness.        + snoring  Cardiovascular:  Negative for chest pain,  palpitations and leg swelling (currently at baseline).  Gastrointestinal:  Negative for abdominal distention and abdominal pain.  Endocrine: Negative.   Genitourinary: Negative.   Musculoskeletal:  Negative for back pain and neck pain.  Skin: Negative.   Allergic/Immunologic: Negative.   Neurological:  Negative for dizziness and light-headedness.  Hematological:  Negative for adenopathy. Does not bruise/bleed easily.  Psychiatric/Behavioral:  Negative for dysphoric mood and sleep disturbance (sleeping on 2 pillows). The patient is not nervous/anxious.    Vitals:   03/02/21 0808  BP: 126/74  Pulse: 72  Resp: 18  SpO2: 98%  Weight: 240  lb 2 oz (108.9 kg)  Height: _0  (1.803 m)   Wt Readings from Last 3 Encounters:  03/02/21 240 lb 2 oz (108.9 kg)  02/03/21 245 lb 4 oz (111.2 kg)  01/29/21 244 lb 8 oz (110.9 kg)   Lab Results  Component Value Date   CREATININE 2.98 (H) 02/17/2021   CREATININE 2.98 (H) 01/29/2021   CREATININE 2.82 (H) 01/16/2021   Physical Exam Vitals and nursing note reviewed. Exam conducted with a chaperone present (wife).  Constitutional:      General: He is not in acute distress.    Appearance: He is well-developed.  HENT:     Head: Normocephalic and atraumatic.  Cardiovascular:     Rate and Rhythm: Normal rate and regular rhythm.     Pulses: Normal pulses.     Heart sounds: Normal heart sounds.  Pulmonary:     Effort: Pulmonary effort is normal. No respiratory distress.     Breath sounds: No wheezing, rhonchi or rales.  Abdominal:     Palpations: Abdomen is soft.     Tenderness: There is no abdominal tenderness.  Musculoskeletal:     Right lower leg: No tenderness. Edema (trace) present.     Left lower leg: No tenderness. Edema (trace) present.  Skin:    General: Skin is warm and dry.  Neurological:     General: No focal deficit present.     Mental Status: He is alert and oriented to person, place, and time.  Psychiatric:        Mood and  Affect: Mood normal.        Behavior: Behavior normal.   Assessment & Plan:  1: Chronic heart failure with reduced ejection fraction- - NYHA class II - euvolemic today - weighing daily; instructed to call for an overnight weight gain of > 2 pounds or a weekly weight gain of > 5 pounds - weight down 4 lbs since last visit, he attributes this to loss of fluids - not adding salt and his wife is not cooking with salt either - saw cardiology Kathlen Mody) 01/16/21 - saw nephrologist (Dr. Marland Kitchen") 02/02/21, spironolactone was added - on GDMT of carvedilol, spironolactone - renal function does not allow for other GDMT  - BNP 01/16/21 was 150  2: HTN with CKD- - BP looks good (126/74)  - saw PCP (Tower) 02/03/21, will see again on 03/05/21 - BMP 02/17/21 reviewed and showed sodium 136, potassium 4.2, creatinine 2.98 & GFR 23 - saw nephrology 02/02/21- spironolactone started  3: DM- - A1c 11/28/20 was 14.7% - glucose was 97 at home this morning  4: Sleep apnea - sleep study this week - reports he rests well, but snores and wakes up having to catch his breath  5: Lymphedema- - much improved and back to his baseline - wife assists him with compression stockings - encouraged to elevate his legs when he's sitting for long periods of time; he tends to sit with his legs down - referral made for compression boots, pt would have $700 OOP cost, based on how good his legs look advised him he can just continue with compression stockings, elevation when possible   Medication list reviewed.   Return in 2 months or sooner for any questions/problems before then.

## 2021-03-02 NOTE — Progress Notes (Addendum)
Subjective:  Patient ID: Roberto Allen, male    DOB: 1960/12/15,   MRN: [id number]  Chief Complaint  Patient presents with   Foot Problem    Pt is here due to wound on right foot. Going on for the past 3 weeks.     61 y.o. male presents for concern of new wound to the right foot that started about 3 weeks ago. Relates drainage to the area and it is open. Relates they have been putting peroxide on it and covering the wound up. Denies any pain Last A1c was 14.7 on 11/28/20  . Denies any other pedal complaints. Denies n/v/f/c.   Past Medical History:  Diagnosis Date   CAD (coronary artery disease)    a. LHC 4/13: mD1 25%, oD2 25%, pOM1 25%, pRCA 25%, dRCA 40%, EF 30%   CHF (congestive heart failure) (HCC)    Chronic kidney disease (CKD), stage III (moderate) (HCC)    Chronic systolic heart failure (HCC)    a. EF 30-35% in 04/2011, improved to 55-60% in 01/2012.   COPD (chronic obstructive pulmonary disease) (HCC)    Diabetes mellitus    GERD (gastroesophageal reflux disease)    History of pneumonia    Hyperkalemia    a. 12/2012 - spironolactone, KCl supp discontinued.   Hyperlipidemia    Hypertension    Hypertriglyceridemia    a. 12/2012 - started on fenofibrate.   NICM (nonischemic cardiomyopathy) ([place])    a. Echo 3/13: mild LVH, EF 30-35%, grade 3 diast dysfxn, mod LAE;  RHC 4/13:  RA 8, RV 58/10, PA 56/18, mean 35, PCWP mean 17, LV 154/27, CO 4.3, CI 2.0. b. EF Improved >>> Echo (01/2012):  Mild LVH, EF 55-60%, no RWMA, Gr 1 DD    Objective:  Physical Exam: Vascular: DP/PT pulses 2/4 bilateral. CFT <3 seconds. Normal hair growth on digits. No edema.  Skin. No lacerations or abrasions bilateral feet.  Wound noted to plantar right first metatrsal with granular base and hyperkeratotic rim. No erythema edema or purulence noted. Measures about 0.5 cm x 0.5 cm x 0.2 cm  Musculoskeletal: MMT 5/5 bilateral lower extremities in DF, PF, Inversion and Eversion. Deceased ROM in DF of ankle  joint.  Neurological: Sensation intact to light touch.   Assessment:   1. Diabetic ulcer of other part of right foot associated with diabetes mellitus due to underlying condition, with fat layer exposed ([place])   2. Type 2 diabetes mellitus with diabetic polyneuropathy, without long-term current use of insulin ([place])      Plan:  Patient was evaluated and treated and all questions answered. Ulcer right first metatarsal wound with fat layer exposed  -Debridement as below. -X-rays reviewed and no osseous erosions noted.  -Dressed with betadine, DSD. -Off-loading with surgical shoe. Has one at home.  -No abx indicated.  -Discussed glucose control and proper protein-rich diet.  -Discussed if any worsening redness, pain, fever or chills to call or may need to report to the emergency room. Patient expressed understanding.   Procedure: Excisional Debridement of Wound Rationale: Removal of non-viable soft tissue from the wound to promote healing.  Anesthesia: none Pre-Debridement Wound Measurements: Overylying hyperkeratoisis  Post-Debridement Wound Measurements: 0.5cm x 0.5 cm x 0.2 cm  Type of Debridement: Sharp Excisional Tissue Removed: Non-viable soft tissue Depth of Debridement: subcutaneous tissue. Technique: Sharp excisional debridement to bleeding, viable wound base.  Dressing: Dry, sterile, compression dressing. Disposition: Patient tolerated procedure well. Patient to return in 2 week for follow-up.  Return  in about 2 weeks (around 03/16/2021) for wound check.   Lorenda Peck, DPM

## 2021-03-02 NOTE — Telephone Encounter (Signed)
Called and spoke to patient, sleep study in April of 2013 does not currently use CPAP machine and is okay with having another sleep study done. Sleep study printed. Nothing further needed.

## 2021-03-02 NOTE — Patient Instructions (Addendum)
[facility] will be moving around the corner to [id number] mid-February. Our phone number will remain the same   Return in 2 months to the HF clinic.  Call the office if you have a weight gain or increased SOB.  Today, your legs look great, if you want to hold off of the compression boots for now, that is completely reasonable.

## 2021-03-03 ENCOUNTER — Telehealth: Payer: Self-pay | Admitting: Family

## 2021-03-03 NOTE — Telephone Encounter (Signed)
Lab results received from nephrology dated 01/29/21:   Sodium 139 Potassium 3.1 BUN 39 Creatinine 2.98 eGFR 23

## 2021-03-04 ENCOUNTER — Encounter: Payer: Self-pay | Admitting: Primary Care

## 2021-03-04 ENCOUNTER — Other Ambulatory Visit: Payer: Self-pay

## 2021-03-04 ENCOUNTER — Ambulatory Visit (INDEPENDENT_AMBULATORY_CARE_PROVIDER_SITE_OTHER): Payer: 59 | Admitting: Primary Care

## 2021-03-04 VITALS — BP 130/78 | HR 77 | Temp 97.7°F | Ht 71.0 in | Wt 241.2 lb

## 2021-03-04 DIAGNOSIS — G4719 Other hypersomnia: Secondary | ICD-10-CM

## 2021-03-04 DIAGNOSIS — R0600 Dyspnea, unspecified: Secondary | ICD-10-CM | POA: Insufficient documentation

## 2021-03-04 DIAGNOSIS — G4733 Obstructive sleep apnea (adult) (pediatric): Secondary | ICD-10-CM

## 2021-03-04 DIAGNOSIS — R0609 Other forms of dyspnea: Secondary | ICD-10-CM

## 2021-03-04 NOTE — Progress Notes (Signed)
@Patient  ID: Roberto Allen, male    DOB: Feb 28, 1960, 61 y.o.   MRN: [id number]  Chief Complaint  Patient presents with   Consult    Referring provider: Nemiah Commander  HPI: 61 year old male, former smoker. PMH significant for CHF, dilated cardiomyopathy, OSA, GERD, CKD stage 3, obesity.   03/04/2021 Patient presents today for sleep consult. Referred by cardiology. He has a hx mild OSA. PSG 06/09/2011 showed average AHI 8.0/hr. Weight 214lbs. Never treated with CPAP. He has symptoms of loud snoring, apnea, restless sleep and daytime sleepiness. Average bedtime is between 8-10pm. He wakes up twice at night. He starts his day at 5am. He has chronic dyspnea symptoms. He is a former smoker but quit >15 years ago. No acute respiratory symptoms. Shortness of breath has improved since he was hospitalized in October 2022 for CHF. Denies chest tightness, wheezing or cough. He does not wear oxygen. Epworth score 16/24. Denies symptoms of narcolepsy or cataplexy.    Allergies  Allergen Reactions   Metformin And Related Other (See Comments)    Due to CHF and CRI    Immunization History  Administered Date(s) Administered   Influenza Inj Mdck Quad Pf 01/12/2016, 11/29/2016   Influenza Split 12/18/2012   Influenza, Seasonal, Injecte, Preservative Fre 11/15/2014   Influenza,inj,Quad PF,6+ Mos 12/19/2017, 11/23/2019, 11/28/2020   Influenza,inj,quad, With Preservative 01/12/2016, 11/29/2016   PFIZER(Purple Top)SARS-COV-2 Vaccination 06/14/2019, 07/05/2019   Pneumococcal Polysaccharide-23 03/12/2009, 08/16/2014   Tdap 02/15/2009, 11/23/2019    Past Medical History:  Diagnosis Date   CAD (coronary artery disease)    a. LHC 4/13: mD1 25%, oD2 25%, pOM1 25%, pRCA 25%, dRCA 40%, EF 30%   CHF (congestive heart failure) (HCC)    Chronic kidney disease (CKD), stage III (moderate) (HCC)    Chronic systolic heart failure (HCC)    a. EF 30-35% in 04/2011, improved to 55-60% in 01/2012.   COPD  (chronic obstructive pulmonary disease) (HCC)    Diabetes mellitus    GERD (gastroesophageal reflux disease)    History of pneumonia    Hyperkalemia    a. 12/2012 - spironolactone, KCl supp discontinued.   Hyperlipidemia    Hypertension    Hypertriglyceridemia    a. 12/2012 - started on fenofibrate.   NICM (nonischemic cardiomyopathy) ([place])    a. Echo 3/13: mild LVH, EF 30-35%, grade 3 diast dysfxn, mod LAE;  RHC 4/13:  RA 8, RV 58/10, PA 56/18, mean 35, PCWP mean 17, LV 154/27, CO 4.3, CI 2.0. b. EF Improved >>> Echo (01/2012):  Mild LVH, EF 55-60%, no RWMA, Gr 1 DD    Tobacco History: Social History   Tobacco Use  Smoking Status Former   Packs/day: 1.00   Years: 20.00   Pack years: 20.00   Types: Cigarettes   Quit date: 09/15/2006   Years since quitting: 14.4  Smokeless Tobacco Never   Counseling given: Not Answered   Outpatient Medications Prior to Visit  Medication Sig Dispense Refill   ACCU-CHEK FASTCLIX LANCETS MISC USE TO MONITOR BLOOD GLUCOSE 3 TIME(S) DAILY  5   aspirin EC 81 MG EC tablet Take 1 tablet (81 mg total) by mouth daily.     atorvastatin (LIPITOR) 20 MG tablet Take 1 tablet (20 mg total) by mouth daily. 90 tablet 1   BD PEN NEEDLE NANO U/F 32G X 4 MM MISC Inject 1 Units into the skin as needed. 100 each 3   Blood Glucose Monitoring Suppl (ONETOUCH VERIO REFLECT) w/Device KIT  1 Units by Does not apply route as needed. 1 kit 0   carvedilol (COREG) 12.5 MG tablet Take 12.5 mg by mouth 2 (two) times daily.     Cholecalciferol (VITAMIN D3) 50 MCG (2000 UT) TABS Take 1 tablet by mouth daily.     doxycycline (VIBRA-TABS) 100 MG tablet Take 1 tablet (100 mg total) by mouth 2 (two) times daily. 20 tablet 0   ergocalciferol (VITAMIN D2) 1.25 MG (50000 UT) capsule Take 50,000 Units by mouth once a week.     glucose blood (ONETOUCH VERIO) test strip Use to check blood sugar 2 times a day 100 each 5   HUMALOG KWIKPEN 100 UNIT/ML KwikPen SMARTSIG:15 Unit(s) SUB-Q 3  Times Daily PRN     hydrALAZINE (APRESOLINE) 100 MG tablet Take 1 tablet (100 mg total) by mouth 3 (three) times daily. 270 tablet 3   isosorbide mononitrate (IMDUR) 60 MG 24 hr tablet Take 1 tablet (60 mg total) by mouth 2 (two) times daily. 180 tablet 1   LANTUS SOLOSTAR 100 UNIT/ML Solostar Pen Inject 50 Units into the skin at bedtime. 15 mL 5   magnesium oxide (MAG-OX) 400 (240 Mg) MG tablet TAKE 1 TABLET BY MOUTH EVERY DAY 90 tablet 1   omeprazole (PRILOSEC) 20 MG capsule Take 1 capsule (20 mg total) by mouth daily. 90 capsule 3   OZEMPIC, 1 MG/DOSE, 4 MG/3ML SOPN INJECT 1 MG UNDER SKIN ONCE A WEEK 3 mL 5   spironolactone (ALDACTONE) 25 MG tablet Take 25 mg by mouth daily.     tadalafil (CIALIS) 20 MG tablet TAKE ONE TABLET BY MOUTH DAILY AS NEEDED FOR ERECTILE DYSFUNCTION 30 tablet 5   torsemide (DEMADEX) 20 MG tablet Take 1 tablet (20 mg total) by mouth daily. 90 tablet 1   No facility-administered medications prior to visit.    Review of Systems  Review of Systems  Constitutional: Negative.   HENT: Negative.    Respiratory:  Negative for cough, chest tightness, shortness of breath and wheezing.        DOE  Psychiatric/Behavioral:  Positive for sleep disturbance.     Physical Exam  BP 130/78 (BP Location: Left Arm, Patient Position: Sitting, Cuff Size: Normal)    Pulse 77    Temp 97.7 F (36.5 C) (Oral)    Ht 5' 11"  (1.803 m)    Wt 241 lb 3.2 oz (109.4 kg)    SpO2 97%    BMI 33.64 kg/m  Physical Exam Constitutional:      General: He is not in acute distress.    Appearance: Normal appearance. He is not ill-appearing.  HENT:     Head: Normocephalic and atraumatic.     Mouth/Throat:     Mouth: Mucous membranes are moist.     Pharynx: Oropharynx is clear.  Cardiovascular:     Rate and Rhythm: Normal rate and regular rhythm.  Pulmonary:     Effort: Pulmonary effort is normal.     Breath sounds: Normal breath sounds. No wheezing, rhonchi or rales.     Comments:  CTA Musculoskeletal:        General: Normal range of motion.     Cervical back: Normal range of motion and neck supple.  Skin:    General: Skin is warm and dry.  Neurological:     General: No focal deficit present.     Mental Status: He is alert and oriented to person, place, and time. Mental status is at baseline.  Psychiatric:  Mood and Affect: Mood normal.        Behavior: Behavior normal.        Thought Content: Thought content normal.        Judgment: Judgment normal.     Lab Results:  CBC    Component Value Date/Time   WBC 7.7 01/06/2021 0838   RBC 4.10 (L) 01/06/2021 0838   HGB 12.4 (L) 01/06/2021 0838   HCT 37.4 (L) 01/06/2021 0838   PLT 329.0 01/06/2021 0838   MCV 91.2 01/06/2021 0838   MCH 31.9 12/16/2020 1353   MCHC 33.1 01/06/2021 0838   RDW 14.4 01/06/2021 0838   LYMPHSABS 1.8 01/06/2021 0838   MONOABS 0.7 01/06/2021 0838   EOSABS 0.2 01/06/2021 0838   BASOSABS 0.0 01/06/2021 0838    BMET    Component Value Date/Time   NA 136 02/17/2021 1007   NA 144 01/16/2021 1024   K 4.2 02/17/2021 1007   CL 102 02/17/2021 1007   CO2 25 02/17/2021 1007   GLUCOSE 188 (H) 02/17/2021 1007   BUN 42 (H) 02/17/2021 1007   BUN 27 01/16/2021 1024   CREATININE 2.98 (H) 02/17/2021 1007   CREATININE 1.93 (H) 07/22/2017 0802   CALCIUM 9.1 02/17/2021 1007   GFRNONAA 23 (L) 02/17/2021 1007   GFRAA 33 (L) 05/01/2019 0310    BNP    Component Value Date/Time   BNP 150.5 (H) 01/16/2021 1024   BNP 501.5 (H) 12/15/2020 1139    ProBNP    Component Value Date/Time   PROBNP 690.4 (H) 12/16/2012 1823    Imaging: DG Foot Complete Right  Result Date: 03/02/2021 Please see detailed radiograph report in office note.    Assessment & Plan:   OSA (obstructive sleep apnea) - NPSG in 2013 showed mild OSA, AHI 8/hr with SpO2 low 87%. Weight 214. Patient has symptoms of loud snoring, apnea, restless sleep and daytime sleepiness. Epworth 16. We discussed risks of  untreated sleep apnea including cardiac arrthymias, stroke, pulm HTN, DM. Ordering home sleep study to reevalute for OSA, suspect apnea could be worse d/t weight gain. Recommend weight loss and side sleeping position and advised against driving if excessive daytime sleepiness. Follow-up in 6 weeks with Eustaquio Maize NP - virtual visit to review sleep study results and treatment if needed.   Dyspnea - Chronic exertional dyspnea. No acute respiratory symptoms. Former smoker. VSS. Lungs were clear on exam. Dyspnea felt to be related to obesity and CHF. If symptoms persist would recommend getting full pulmonary function testing.    Martyn Ehrich, NP 03/04/2021

## 2021-03-04 NOTE — Patient Instructions (Signed)
Recommendations: Work on weight loss Focus on side sleeping position Do not drive if experiencing excessive daytime sleepiness  Orders: Home sleep study  Follow-up: 6 weeks with Eustaquio Maize NP - virtual visit to review sleep study results and treatment if needed    Sleep Apnea Sleep apnea is a condition in which breathing pauses or becomes shallow during sleep. People with sleep apnea usually snore loudly. They may have times when they gasp and stop breathing for 10 seconds or more during sleep. This may happen many times during the night. Sleep apnea disrupts your sleep and keeps your body from getting the rest that it needs. This condition can increase your risk of certain health problems, including: Heart attack. Stroke. Obesity. Type 2 diabetes. Heart failure. Irregular heartbeat. High blood pressure. The goal of treatment is to help you breathe normally again. What are the causes? The most common cause of sleep apnea is a collapsed or blocked airway. There are three kinds of sleep apnea: Obstructive sleep apnea. This kind is caused by a blocked or collapsed airway. Central sleep apnea. This kind happens when the part of the brain that controls breathing does not send the correct signals to the muscles that control breathing. Mixed sleep apnea. This is a combination of obstructive and central sleep apnea. What increases the risk? You are more likely to develop this condition if you: Are overweight. Smoke. Have a smaller than normal airway. Are older. Are male. Drink alcohol. Take sedatives or tranquilizers. Have a family history of sleep apnea. Have a tongue or tonsils that are larger than normal. What are the signs or symptoms? Symptoms of this condition include: Trouble staying asleep. Loud snoring. Morning headaches. Waking up gasping. Dry mouth or sore throat in the morning. Daytime sleepiness and tiredness. If you have daytime fatigue because of sleep apnea, you may  be more likely to have: Trouble concentrating. Forgetfulness. Irritability or mood swings. Personality changes. Feelings of depression. Sexual dysfunction. This may include loss of interest if you are male, or erectile dysfunction if you are male. How is this diagnosed? This condition may be diagnosed with: A medical history. A physical exam. A series of tests that are done while you are sleeping (sleep study). These tests are usually done in a sleep lab, but they may also be done at home. How is this treated? Treatment for this condition aims to restore normal breathing and to ease symptoms during sleep. It may involve managing health issues that can affect breathing, such as high blood pressure or obesity. Treatment may include: Sleeping on your side. Using a decongestant if you have nasal congestion. Avoiding the use of depressants, including alcohol, sedatives, and narcotics. Losing weight if you are overweight. Making changes to your diet. Quitting smoking. Using a device to open your airway while you sleep, such as: An oral appliance. This is a custom-made mouthpiece that shifts your lower jaw forward. A continuous positive airway pressure (CPAP) device. This device blows air through a mask when you breathe out (exhale). A nasal expiratory positive airway pressure (EPAP) device. This device has valves that you put into each nostril. A bi-level positive airway pressure (BIPAP) device. This device blows air through a mask when you breathe in (inhale) and breathe out (exhale). Having surgery if other treatments do not work. During surgery, excess tissue is removed to create a wider airway. Follow these instructions at home: Lifestyle Make any lifestyle changes that your health care provider recommends. Eat a healthy, well-balanced diet. Take  steps to lose weight if you are overweight. Avoid using depressants, including alcohol, sedatives, and narcotics. Do not use any products  that contain nicotine or tobacco. These products include cigarettes, chewing tobacco, and vaping devices, such as e-cigarettes. If you need help quitting, ask your health care provider. General instructions Take over-the-counter and prescription medicines only as told by your health care provider. If you were given a device to open your airway while you sleep, use it only as told by your health care provider. If you are having surgery, make sure to tell your health care provider you have sleep apnea. You may need to bring your device with you. Keep all follow-up visits. This is important. Contact a health care provider if: The device that you received to open your airway during sleep is uncomfortable or does not seem to be working. Your symptoms do not improve. Your symptoms get worse. Get help right away if: You develop: Chest pain. Shortness of breath. Discomfort in your back, arms, or stomach. You have: Trouble speaking. Weakness on one side of your body. Drooping in your face. These symptoms may represent a serious problem that is an emergency. Do not wait to see if the symptoms will go away. Get medical help right away. Call your local emergency services (911 in the [place] Do not drive yourself to the hospital. Summary Sleep apnea is a condition in which breathing pauses or becomes shallow during sleep. The most common cause is a collapsed or blocked airway. The goal of treatment is to restore normal breathing and to ease symptoms during sleep. This information is not intended to replace advice given to you by your health care provider. Make sure you discuss any questions you have with your health care provider. Document Revised: 09/10/2020 Document Reviewed: 01/11/2020 Elsevier Patient Education  2022 [employer].

## 2021-03-04 NOTE — Progress Notes (Signed)
Reviewed and agree with assessment/plan. ° ° °Jannell Franta, MD °[facility] °03/04/2021, 7:08 PM °Pager:  [phone] ° °

## 2021-03-04 NOTE — Assessment & Plan Note (Addendum)
-   NPSG in 2013 showed mild OSA, AHI 8/hr with SpO2 low 87%. Weight 214. Patient has symptoms of loud snoring, apnea, restless sleep and daytime sleepiness. Epworth 16. We discussed risks of untreated sleep apnea including cardiac arrthymias, stroke, pulm HTN, DM. Ordering home sleep study to reevalute for OSA, suspect apnea could be worse d/t weight gain. Recommend weight loss and side sleeping position and advised against driving if excessive daytime sleepiness. Follow-up in 6 weeks with Roberto Maize NP - virtual visit to review sleep study results and treatment if needed.

## 2021-03-04 NOTE — Assessment & Plan Note (Signed)
-   Chronic exertional dyspnea. No acute respiratory symptoms. Former smoker. VSS. Lungs were clear on exam. Dyspnea felt to be related to obesity and CHF. If symptoms persist would recommend getting full pulmonary function testing.

## 2021-03-05 ENCOUNTER — Encounter: Payer: Self-pay | Admitting: Family Medicine

## 2021-03-05 ENCOUNTER — Other Ambulatory Visit: Payer: Self-pay

## 2021-03-05 ENCOUNTER — Ambulatory Visit (INDEPENDENT_AMBULATORY_CARE_PROVIDER_SITE_OTHER): Payer: 59 | Admitting: Family Medicine

## 2021-03-05 VITALS — BP 142/90 | HR 78 | Temp 98.0°F | Ht 71.0 in | Wt 240.0 lb

## 2021-03-05 DIAGNOSIS — R600 Localized edema: Secondary | ICD-10-CM

## 2021-03-05 DIAGNOSIS — I5033 Acute on chronic diastolic (congestive) heart failure: Secondary | ICD-10-CM | POA: Diagnosis not present

## 2021-03-05 NOTE — Assessment & Plan Note (Signed)
Multifactorial with CHF and CKD and lymphedema  Now with support hose and help from wife much better  addn of aldactone also helping  May try compression boosts in the future also  Encouraged low salt diet  Ready to return to work-letter done to return Monday 1/23 if still doing well

## 2021-03-05 NOTE — Assessment & Plan Note (Signed)
Fairly stable now under care of cardiology Pedal edema is improved with addn of aldactone  Sob is improved and baseline  Pt feels ready to try and return to work

## 2021-03-05 NOTE — Telephone Encounter (Signed)
Forms were faxed on 02/23/21, I have a copy (see separate phone note regarding this). Pt has a f/u appt today with PCP and will discuss directly with him then

## 2021-03-05 NOTE — Telephone Encounter (Signed)
Pt came in for an appt and forms given directly to pt

## 2021-03-05 NOTE — Patient Instructions (Signed)
I'm glad you are doing better  Follow up with specialists as planned Gradually increase exercise   Continue low sodium and low sugar diet   Keep watching glucose levels   You can return to work on Monday if you feel up to it  If you are unable to keep up with full time please let us know

## 2021-03-05 NOTE — Progress Notes (Signed)
Subjective:    Patient ID: Roberto Allen, male    DOB: 21-Dec-1960, 61 y.o.   MRN: [id number]  This visit occurred during the SARS-CoV-2 public health emergency.  Safety protocols were in place, including screening questions prior to the visit, additional usage of staff PPE, and extensive cleaning of exam room while observing appropriate contact time as indicated for disinfecting solutions.   HPI Pt presents for f/u of pedal edema and CKD , DM2 and other chronic medical problems   Wt Readings from Last 3 Encounters:  03/05/21 240 lb (108.9 kg)  03/04/21 241 lb 3.2 oz (109.4 kg)  03/02/21 240 lb 2 oz (108.9 kg)   33.47 kg/m  Still wrestling with disability paperwork   He has been out of work for severe painful pedal edema for another month   Wants to go back full time  Wife can prep lunch/no more fast food   HTN in setting of CHF Shortness of breath is baseline  Thinks swelling is improved   bp is stable today  No cp or palpitations or headaches or edema  No side effects to medicines  BP Readings from Last 3 Encounters:  03/05/21 (!) 142/90  03/04/21 130/78  03/02/21 126/74     DM2 Has endocrinology appt with Dr Hartford Poli in early Feb Lab Results  Component Value Date   HGBA1C 14.7 (A) 11/28/2020   Blood sugars have been good  No lows  Low sodium and low sugar diet   Has been able to do more /getting out and walking just a bit  Able to walk to the mailbox     CKD Sees Dr Marland Kitchen  Nothing new after addn of aldactone   Was seen by pulmonary yesterday for eval of osa   Saw podiatry for DM foot ulcer on 03/02/21    Pedal edema -a lot better with spironolactone and supp hose (wife helps with)  May not need the compression boots   Per recent cardiology note: Lymphedema- - much improved and back to his baseline - wife assists him with compression stockings - encouraged to elevate his legs when he's sitting for long periods of time; he tends to sit with his  legs down - referral made for compression boots, pt would have $700 OOP cost, based on how good his legs look advised him he can just continue with compression stockings, elevation when possible Aldactone Torsemide    Lab Results  Component Value Date   CREATININE 2.98 (H) 02/17/2021   BUN 42 (H) 02/17/2021   NA 136 02/17/2021   K 4.2 02/17/2021   CL 102 02/17/2021   CO2 25 02/17/2021   GFR 19.85  Lab Results  Component Value Date   CHOL 261 (H) 11/28/2020   HDL 42.10 11/28/2020   LDLCALC UNABLE TO CALCULATE IF TRIGLYCERIDE OVER 400 mg/dL 12/17/2012   LDLDIRECT 120.0 11/28/2020   TRIG (H) 11/28/2020    507.0 Triglyceride is over 400; calculations on Lipids are invalid.   CHOLHDL 6 11/28/2020    Patient Active Problem List   Diagnosis Date Noted   Dyspnea 03/04/2021   Pedal edema 02/03/2021   Dilated cardiomyopathy ([place]) 12/19/2020   CHF exacerbation ([place]) 12/16/2020   Acute on chronic diastolic CHF (congestive heart failure) ([place]) 12/16/2020   Hypokalemia 12/16/2020   CHF (congestive heart failure) ([place]) 04/28/2019   Hypertension secondary to other renal disorders 11/29/2016   GERD (gastroesophageal reflux disease) 07/23/2016   Degenerative disc disease at L5-S1 level 04/09/2016  Primary osteoarthritis of right hip 04/09/2016   Obesity, Class I, BMI 30-34.9 02/06/2016   Ulcer of left foot ([place]) 01/06/2015   Microalbuminuria due to type 2 diabetes mellitus ([place]) 10/03/2014   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 diabetes mellitus ([place]) 06/26/2014   NICM (nonischemic cardiomyopathy) ([place]) 09/06/2013   ED (erectile dysfunction) 03/27/2013   Disorder of kidney and ureter 12/17/2012   DM (diabetes mellitus), type 2 with renal complications ([place]) 19/50/9326   Coronary Artery Disease (non-obstructive by cath 05/2011) 06/14/2011   Hyperlipidemia associated with type 2 diabetes mellitus ([place]) 06/14/2011   OSA (obstructive sleep apnea) 05/25/2011   Hypertension    Past Medical  History:  Diagnosis Date   CAD (coronary artery disease)    a. LHC 4/13: mD1 25%, oD2 25%, pOM1 25%, pRCA 25%, dRCA 40%, EF 30%   CHF (congestive heart failure) (HCC)    Chronic kidney disease (CKD), stage III (moderate) (HCC)    Chronic systolic heart failure (HCC)    a. EF 30-35% in 04/2011, improved to 55-60% in 01/2012.   COPD (chronic obstructive pulmonary disease) (HCC)    Diabetes mellitus    GERD (gastroesophageal reflux disease)    History of pneumonia    Hyperkalemia    a. 12/2012 - spironolactone, KCl supp discontinued.   Hyperlipidemia    Hypertension    Hypertriglyceridemia    a. 12/2012 - started on fenofibrate.   NICM (nonischemic cardiomyopathy) ([place])    a. Echo 3/13: mild LVH, EF 30-35%, grade 3 diast dysfxn, mod LAE;  RHC 4/13:  RA 8, RV 58/10, PA 56/18, mean 35, PCWP mean 17, LV 154/27, CO 4.3, CI 2.0. b. EF Improved >>> Echo (01/2012):  Mild LVH, EF 55-60%, no RWMA, Gr 1 DD   Past Surgical History:  Procedure Laterality Date   CARDIAC CATHETERIZATION  2013   non-obs dz 25-40% multiple vessels   TENOTOMY  2018   Social History   Tobacco Use   Smoking status: Former    Packs/day: 1.00    Years: 20.00    Pack years: 20.00    Types: Cigarettes    Quit date: 09/15/2006    Years since quitting: 14.4   Smokeless tobacco: Never  Vaping Use   Vaping Use: Never used  Substance Use Topics   Alcohol use: Not Currently    Comment: occ   Drug use: No   Family History  Problem Relation Age of Onset   Diabetes Sister    Breast cancer Mother    Breast cancer Maternal Grandmother    Allergies  Allergen Reactions   Metformin And Related Other (See Comments)    Due to CHF and CRI   Current Outpatient Medications on File Prior to Visit  Medication Sig Dispense Refill   ACCU-CHEK FASTCLIX LANCETS MISC USE TO MONITOR BLOOD GLUCOSE 3 TIME(S) DAILY  5   aspirin EC 81 MG EC tablet Take 1 tablet (81 mg total) by mouth daily.     atorvastatin (LIPITOR) 20 MG tablet  Take 1 tablet (20 mg total) by mouth daily. 90 tablet 1   BD PEN NEEDLE NANO U/F 32G X 4 MM MISC Inject 1 Units into the skin as needed. 100 each 3   Blood Glucose Monitoring Suppl (ONETOUCH VERIO REFLECT) w/Device KIT 1 Units by Does not apply route as needed. 1 kit 0   carvedilol (COREG) 12.5 MG tablet Take 12.5 mg by mouth 2 (two) times daily.     Cholecalciferol (VITAMIN D3) 50  MCG (2000 UT) TABS Take 1 tablet by mouth daily.     ergocalciferol (VITAMIN D2) 1.25 MG (50000 UT) capsule Take 50,000 Units by mouth once a week.     glucose blood (ONETOUCH VERIO) test strip Use to check blood sugar 2 times a day 100 each 5   HUMALOG KWIKPEN 100 UNIT/ML KwikPen SMARTSIG:15 Unit(s) SUB-Q 3 Times Daily PRN     hydrALAZINE (APRESOLINE) 100 MG tablet Take 1 tablet (100 mg total) by mouth 3 (three) times daily. 270 tablet 3   isosorbide mononitrate (IMDUR) 60 MG 24 hr tablet Take 1 tablet (60 mg total) by mouth 2 (two) times daily. 180 tablet 1   LANTUS SOLOSTAR 100 UNIT/ML Solostar Pen Inject 50 Units into the skin at bedtime. 15 mL 5   magnesium oxide (MAG-OX) 400 (240 Mg) MG tablet TAKE 1 TABLET BY MOUTH EVERY DAY 90 tablet 1   omeprazole (PRILOSEC) 20 MG capsule Take 1 capsule (20 mg total) by mouth daily. 90 capsule 3   OZEMPIC, 1 MG/DOSE, 4 MG/3ML SOPN INJECT 1 MG UNDER SKIN ONCE A WEEK 3 mL 5   spironolactone (ALDACTONE) 25 MG tablet Take 25 mg by mouth daily.     tadalafil (CIALIS) 20 MG tablet TAKE ONE TABLET BY MOUTH DAILY AS NEEDED FOR ERECTILE DYSFUNCTION 30 tablet 5   torsemide (DEMADEX) 20 MG tablet Take 1 tablet (20 mg total) by mouth daily. 90 tablet 1   [DISCONTINUED] potassium chloride (KLOR-CON) 20 MEQ packet Take 20 mEq by mouth 2 (two) times daily.     No current facility-administered medications on file prior to visit.      Review of Systems  Constitutional:  Negative for activity change, appetite change, fatigue, fever and unexpected weight change.  HENT:  Negative for  congestion, rhinorrhea, sore throat and trouble swallowing.   Eyes:  Negative for pain, redness, itching and visual disturbance.  Respiratory:  Positive for shortness of breath. Negative for cough, chest tightness and wheezing.        Sob is better/at baseline and tolerable  Cardiovascular:  Positive for leg swelling. Negative for chest pain and palpitations.       Improved swelling  Gastrointestinal:  Negative for abdominal pain, blood in stool, constipation, diarrhea and nausea.  Endocrine: Negative for cold intolerance, heat intolerance, polydipsia and polyuria.  Genitourinary:  Negative for difficulty urinating, dysuria, frequency and urgency.  Musculoskeletal:  Negative for arthralgias, joint swelling and myalgias.  Skin:  Negative for pallor and rash.  Neurological:  Negative for dizziness, tremors, weakness, numbness and headaches.  Hematological:  Negative for adenopathy. Does not bruise/bleed easily.  Psychiatric/Behavioral:  Negative for decreased concentration and dysphoric mood. The patient is not nervous/anxious.       Objective:   Physical Exam Constitutional:      General: He is not in acute distress.    Appearance: Normal appearance. He is well-developed. He is obese. He is not ill-appearing or diaphoretic.  HENT:     Head: Normocephalic and atraumatic.  Eyes:     Conjunctiva/sclera: Conjunctivae normal.     Pupils: Pupils are equal, round, and reactive to light.  Neck:     Thyroid: No thyromegaly.     Vascular: No carotid bruit or JVD.  Cardiovascular:     Rate and Rhythm: Normal rate and regular rhythm.     Heart sounds: Normal heart sounds.    No gallop.  Pulmonary:     Effort: Pulmonary effort is normal. No respiratory distress.  Breath sounds: Normal breath sounds. No stridor. No wheezing or rales.     Comments: Distant bs at bases  No crackles or wheezing Abdominal:     General: Bowel sounds are normal. There is no distension or abdominal bruit.      Palpations: Abdomen is soft. There is no mass.     Tenderness: There is no abdominal tenderness.  Musculoskeletal:     Cervical back: Normal range of motion and neck supple.     Right lower leg: Edema present.     Left lower leg: Edema present.     Comments: Trace pedal edema  Much improved   Lymphadenopathy:     Cervical: No cervical adenopathy.  Skin:    General: Skin is warm and dry.     Coloration: Skin is not pale.     Findings: No rash.  Neurological:     Mental Status: He is alert.     Coordination: Coordination normal.     Deep Tendon Reflexes: Reflexes are normal and symmetric. Reflexes normal.  Psychiatric:        Mood and Affect: Mood normal.          Assessment & Plan:   Problem List Items Addressed This Visit       Cardiovascular and Mediastinum   CHF (congestive heart failure) (HCC)    Fairly stable now under care of cardiology Pedal edema is improved with addn of aldactone  Sob is improved and baseline  Pt feels ready to try and return to work        Other   Pedal edema - Primary    Multifactorial with CHF and CKD and lymphedema  Now with support hose and help from wife much better  addn of aldactone also helping  May try compression boosts in the future also  Encouraged low salt diet  Ready to return to work-letter done to return Monday 1/23 if still doing well

## 2021-03-08 ENCOUNTER — Encounter: Payer: Self-pay | Admitting: Family Medicine

## 2021-03-16 ENCOUNTER — Other Ambulatory Visit: Payer: Self-pay | Admitting: Family Medicine

## 2021-03-19 ENCOUNTER — Encounter: Payer: Self-pay | Admitting: Podiatry

## 2021-03-19 ENCOUNTER — Ambulatory Visit (INDEPENDENT_AMBULATORY_CARE_PROVIDER_SITE_OTHER): Payer: 59 | Admitting: Podiatry

## 2021-03-19 ENCOUNTER — Other Ambulatory Visit: Payer: Self-pay

## 2021-03-19 DIAGNOSIS — L84 Corns and callosities: Secondary | ICD-10-CM | POA: Diagnosis not present

## 2021-03-19 DIAGNOSIS — E1142 Type 2 diabetes mellitus with diabetic polyneuropathy: Secondary | ICD-10-CM

## 2021-03-19 NOTE — Progress Notes (Signed)
°  Subjective:  Patient ID: Roberto Allen, male    DOB: [birth year],   MRN: [id number]  Chief Complaint  Patient presents with   Diabetic Ulcer    Right foot, 2 week follow up    61 y.o. male presents for follow-up of right foot wound. Relates it is doing well and believes it is healed. Denies drainage redness or swelling. Last A1c has improved to 6.9 . Denies any other pedal complaints. Denies n/v/f/c.   PCP: Loura Pardon MD   Past Medical History:  Diagnosis Date   CAD (coronary artery disease)    a. LHC 4/13: mD1 25%, oD2 25%, pOM1 25%, pRCA 25%, dRCA 40%, EF 30%   CHF (congestive heart failure) (HCC)    Chronic kidney disease (CKD), stage III (moderate) (HCC)    Chronic systolic heart failure (HCC)    a. EF 30-35% in 04/2011, improved to 55-60% in 01/2012.   COPD (chronic obstructive pulmonary disease) (HCC)    Diabetes mellitus    GERD (gastroesophageal reflux disease)    History of pneumonia    Hyperkalemia    a. 12/2012 - spironolactone, KCl supp discontinued.   Hyperlipidemia    Hypertension    Hypertriglyceridemia    a. 12/2012 - started on fenofibrate.   NICM (nonischemic cardiomyopathy) ([place])    a. Echo 3/13: mild LVH, EF 30-35%, grade 3 diast dysfxn, mod LAE;  RHC 4/13:  RA 8, RV 58/10, PA 56/18, mean 35, PCWP mean 17, LV 154/27, CO 4.3, CI 2.0. b. EF Improved >>> Echo (01/2012):  Mild LVH, EF 55-60%, no RWMA, Gr 1 DD    Objective:  Physical Exam: Vascular: DP/PT pulses 2/4 bilateral. CFT <3 seconds. Normal hair growth on digits. No edema.  Skin. No lacerations or abrasions bilateral feet.  Hyperkeratotic lesions to right first metatarsal. Healed ulceration underlying.  Musculoskeletal: MMT 5/5 bilateral lower extremities in DF, PF, Inversion and Eversion. Deceased ROM in DF of ankle joint.  Neurological: Sensation intact to light touch.   Assessment:   1. Type 2 diabetes mellitus with diabetic polyneuropathy, without long-term current use of insulin ([place])   2.  Pre-ulcerative calluses      Plan:  Patient was evaluated and treated and all questions answered. Ulcer right first metatarsal wound with fat layer exposed  -Hyperkeratotic tissue debrided without incident. Underlying ulcer healed.  -Will scheudle to get fitted for DM shoes  -In meantime will keep offloading area with horshoe padding to prevent re-ulceration.  -No abx indicated.  -Discussed glucose control and proper protein-rich diet.  -Discussed if any re-ulceration worsening redness, pain, fever or chills to call or may need to report to the emergency room. Patient expressed understanding.    Return in about 4 weeks (around 04/16/2021) for wound check.   Lorenda Peck, DPM

## 2021-03-26 ENCOUNTER — Telehealth: Payer: 59

## 2021-04-06 ENCOUNTER — Encounter: Payer: Self-pay | Admitting: Family Medicine

## 2021-04-06 MED ORDER — TORSEMIDE 20 MG PO TABS: 20.0000 mg | ORAL_TABLET | Freq: Every day | ORAL | 1 refills | Status: DC

## 2021-04-08 ENCOUNTER — Other Ambulatory Visit: Payer: Self-pay

## 2021-04-08 ENCOUNTER — Ambulatory Visit: Payer: 59

## 2021-04-08 DIAGNOSIS — G4733 Obstructive sleep apnea (adult) (pediatric): Secondary | ICD-10-CM | POA: Diagnosis not present

## 2021-04-08 DIAGNOSIS — G4719 Other hypersomnia: Secondary | ICD-10-CM

## 2021-04-09 ENCOUNTER — Ambulatory Visit: Payer: 59

## 2021-04-09 NOTE — Chronic Care Management (AMB) (Signed)
°  Care Management   Follow Up Note   04/09/2021 Name: Harish Bram MRN: [id number] DOB: 10/22/1960   Referred by: Tower, Wynelle Fanny, MD Reason for referral : Chronic Care Management  Telephone call to patient for initial chronic care management outreach.  Spoke with patient's wife/ designated party release [employer].  HIPAA verified by wife for patient. Wife states patient is back at work. She verbally agreed to chronic care management services for patient and request calls be made to her.  Wife states she is not able to take [facility] call today and request to rescheduled.   Follow Up Plan: Telephone appointment rescheduled to 04/16/21 at 1:00 pm.  Wife given contact number for [facility].   Quinn Plowman RN,BSN,CCM RN Case Manager [place]  [phone]

## 2021-04-10 ENCOUNTER — Telehealth: Payer: Self-pay | Admitting: Podiatry

## 2021-04-10 NOTE — Telephone Encounter (Signed)
Pt canceled appt on 3.1.2023 with Dr Blenda Mounts for his wound care via my chart. I called pt to confirm he wanted to cancel the wound care appt. Pt was also scheduled to see Aaron Edelman that day @ 345 and that appt did not get canceled. Wanted to confirm if he wanted to keep the appt with Aaron Edelman as well.

## 2021-04-13 DIAGNOSIS — G4733 Obstructive sleep apnea (adult) (pediatric): Secondary | ICD-10-CM

## 2021-04-15 ENCOUNTER — Ambulatory Visit: Payer: 59 | Admitting: Podiatry

## 2021-04-15 ENCOUNTER — Other Ambulatory Visit: Payer: 59

## 2021-04-16 ENCOUNTER — Telehealth: Payer: Self-pay

## 2021-04-16 ENCOUNTER — Telehealth: Payer: 59

## 2021-04-16 NOTE — Telephone Encounter (Signed)
?  Care Management  ? ?Follow Up Note ? ? ?04/16/2021 ?Name: Roberto Allen MRN: [id number] DOB: 1960/12/21 ? ? ?Referred by: Tower, Wynelle Fanny, MD ?Reason for referral : Chronic Care Management ? ? ?Successful contact made with wife/ designated party release Roberto Allen.  HIPAA verified by wife for patient. Wife states patient is at work. She states she is not able to take call with RNCM today due to just returning from medical appointment. Wife request to reschedule today's call.  ? ?Follow Up Plan: RNCM rescheduled call with wife to April 27, 2021 at 9: 30 am.  ? ?Quinn Plowman RN,BSN,CCM ?RN Case Manager ?[place]  ?[phone] ? ?

## 2021-04-21 NOTE — Progress Notes (Signed)
Cardiology Office Note:    Date:  04/22/2021   ID:  [name], DOB 04/14/1960, MRN [id number]  PCP:  Abner Greenspan, MD  [facility] Cardiologist:  Sanda Klein, MD  [facility] Electrophysiologist:  None   Referring MD: Abner Greenspan, MD   Chief Complaint: 3 month f/u  History of Present Illness:    Roberto Allen is a 61 y.o. male with a hx of nonobstructive CAD, HFmrEF, CKD stage 3b, HTN, HLD, DM2, OSA, COPD, GERD who presents for follow-up.    Cardiac history dates back to 2013 when he was seen for the evaluation of dyspnea. Echo showed an EF of 30-35%, diffuse hypokinesis, moderately to severely reduced systolic function, grade 3 diastolic dysfunction, and moderate LAE. He underwent a cardiac catheterization in 05/2011, which showed non-obstructive CAD (mD1 25%, oD2 25%, pOM1 25%, pRCA 25%, dRCA 40%). Follow-up echo of 01/2012 showed an improved EF of 55%. He was hospitalized in 12/2012 for chest pain. Lexiscan myoview on 12/17/2012 showed no evidence of ischemia.    He was admitted to the hospital again in March 2021 with an acute exacerbation of HFimpEF, which was complicated by severely elevated blood pressure and acute renal insufficiency, limiting GDMT. Cardiac catheterization was deferred in the setting of CKD. Echo at the time showed a stable EF of 50-55%. He was seen in the office on 08/27/2020 and was doing well overall on maximally tolerated GDMT, with NYHA class II-III symptoms.    He returned to the ED on 12/15/2020, and was admitted from 12/15/2020-12/19/2020 for worsening shortness of breath, leg swelling, and severely elevated blood pressure in the setting of HFmrEF. Repeat echo showed a reduced EF of 40-45% (down from 50-55% in 2021). Repeat stress test showed no ischemia. He was treated with IV lasix with improvement in CHF symptoms. Escalation of GDMT was limited by CKD. He was discharged on amlodipine, atorvastatin, carvedilol, hydralazine, isosorbide  mononitrate, empagliflozin, losartan, and torsemide. He saw his PCP for follow-up on 12/22/2020, follow-up BUN 37,  sCr 3.19 (up from 34/2.65 at hospital discharge). He was continued on his medications and strongly encouraged to follow-up with neprhology. Additionally, he was seen in the HF clinic on 12/26/2020. He was euvolemic at the time despite a 4 pound weight gain since hospital discharge, and showed improved blood pressure. GDMT continued with carvedilol, jardiance and losartan, hydralazine/isosorbide, and torsemide.    The patient was seen 12/30/20 and was overall doing well from a cardiac standpoint. Losartan was stopped for worsening renal function. Hydralazine was increased. Labs were drawn. Seen back in clinic and amlodipine was decreased.   Last seen 01/16/21 with persistent lower leg edema. Torsemide was increased for 3 days.  Today, the patient reports he has been doing well since the last visit. Reports no further swelling on his feet. NO chest pain or shortness of breath. No LLE, orthopnea, pnd. No problems with medications. He follows with nephrology for CKD. He had a recetn sleep study that showed severe OSA, he will continue to follow with pulmonology.   Past Medical History:  Diagnosis Date   CAD (coronary artery disease)    a. LHC 4/13: mD1 25%, oD2 25%, pOM1 25%, pRCA 25%, dRCA 40%, EF 30%   CHF (congestive heart failure) (HCC)    Chronic kidney disease (CKD), stage III (moderate) (HCC)    Chronic systolic heart failure (HCC)    a. EF 30-35% in 04/2011, improved to 55-60% in 01/2012.   COPD (chronic obstructive pulmonary disease) ([place])  Diabetes mellitus    GERD (gastroesophageal reflux disease)    History of pneumonia    Hyperkalemia    a. 12/2012 - spironolactone, KCl supp discontinued.   Hyperlipidemia    Hypertension    Hypertriglyceridemia    a. 12/2012 - started on fenofibrate.   NICM (nonischemic cardiomyopathy) ([place])    a. Echo 3/13: mild LVH, EF 30-35%, grade  3 diast dysfxn, mod LAE;  RHC 4/13:  RA 8, RV 58/10, PA 56/18, mean 35, PCWP mean 17, LV 154/27, CO 4.3, CI 2.0. b. EF Improved >>> Echo (01/2012):  Mild LVH, EF 55-60%, no RWMA, Gr 1 DD    Past Surgical History:  Procedure Laterality Date   CARDIAC CATHETERIZATION  2013   non-obs dz 25-40% multiple vessels   TENOTOMY  2018    Current Medications: Current Meds  Medication Sig   ACCU-CHEK FASTCLIX LANCETS MISC USE TO MONITOR BLOOD GLUCOSE 3 TIME(S) DAILY   aspirin EC 81 MG EC tablet Take 1 tablet (81 mg total) by mouth daily.   BD PEN NEEDLE NANO U/F 32G X 4 MM MISC Inject 1 Units into the skin as needed.   Blood Glucose Monitoring Suppl (ONETOUCH VERIO REFLECT) w/Device KIT 1 Units by Does not apply route as needed.   carvedilol (COREG) 12.5 MG tablet Take 12.5 mg by mouth 2 (two) times daily.   Cholecalciferol (VITAMIN D3) 50 MCG (2000 UT) TABS Take 1 tablet by mouth daily.   ergocalciferol (VITAMIN D2) 1.25 MG (50000 UT) capsule Take 50,000 Units by mouth once a week.   glucose blood (ONETOUCH VERIO) test strip Use to check blood sugar 2 times a day   HUMALOG KWIKPEN 100 UNIT/ML KwikPen SMARTSIG:15 Unit(s) SUB-Q 3 Times Daily PRN   hydrALAZINE (APRESOLINE) 100 MG tablet Take 1 tablet (100 mg total) by mouth 3 (three) times daily.   isosorbide mononitrate (IMDUR) 60 MG 24 hr tablet Take 1 tablet (60 mg total) by mouth 2 (two) times daily.   LANTUS SOLOSTAR 100 UNIT/ML Solostar Pen Inject 50 Units into the skin at bedtime.   magnesium oxide (MAG-OX) 400 (240 Mg) MG tablet TAKE 1 TABLET BY MOUTH EVERY DAY   omeprazole (PRILOSEC) 20 MG capsule Take 1 capsule (20 mg total) by mouth daily.   OZEMPIC, 1 MG/DOSE, 4 MG/3ML SOPN INJECT 1 MG UNDER SKIN ONCE A WEEK   spironolactone (ALDACTONE) 25 MG tablet Take 25 mg by mouth daily.   tadalafil (CIALIS) 20 MG tablet TAKE ONE TABLET BY MOUTH DAILY AS NEEDED FOR ERECTILE DYSFUNCTION   torsemide (DEMADEX) 20 MG tablet Take 1 tablet (20 mg total) by  mouth daily.   [DISCONTINUED] atorvastatin (LIPITOR) 20 MG tablet Take 1 tablet (20 mg total) by mouth daily.     Allergies:   Metformin and related   Social History   Socioeconomic History   Marital status: Married    Spouse name: Not on file   Number of children: 6   Years of education: Not on file   Highest education level: Not on file  Occupational History   Occupation: [employer]    Comment: Dentist: [employer]  Tobacco Use   Smoking status: Former    Packs/day: 1.00    Years: 20.00    Pack years: 20.00    Types: Cigarettes    Quit date: 09/15/2006    Years since quitting: 14.6   Smokeless tobacco: Never  Vaping Use   Vaping Use: Never used  Substance and Sexual Activity   Alcohol use: Not Currently    Comment: occ   Drug use: No   Sexual activity: Yes  Other Topics Concern   Not on file  Social History Narrative   Lives with wife.   Social Determinants of Health   Financial Resource Strain: Not on file  Food Insecurity: Not on file  Transportation Needs: Not on file  Physical Activity: Not on file  Stress: Not on file  Social Connections: Not on file     Family History: The patient's family history includes Breast cancer in his maternal grandmother and mother; Diabetes in his sister.  ROS:   Please see the history of present illness.     All other systems reviewed and are negative.  EKGs/Labs/Other Studies Reviewed:    The following studies were reviewed today:  Myoview Lexiscan 12/18/20 Narrative & Impression  Pharmacological myocardial perfusion imaging study with no significant  ischemia Global hypokinesis , EF estimated at 33% No EKG changes concerning for ischemia at peak stress or in recovery. CT attenuation correction images with proximal/mid LAD calcification, distal RCA, no significant aortic atherosclerosis Low risk scan     Signed, Esmond Plants, MD, Ph.D [facility]      Echo 12/16/20    1. Left  ventricular ejection fraction, by estimation, is 40 to 45%. The  left ventricle has mildly decreased function. The left ventricle  demonstrates global hypokinesis. There is more severe hypokinesis of the  basal inferior/inferolateral segments.  There is moderate left ventricular hypertrophy. Left ventricular diastolic  parameters are consistent with Grade I diastolic dysfunction (impaired  relaxation).   2. Right ventricular systolic function is normal. The right ventricular  size is mildly enlarged. Tricuspid regurgitation signal is inadequate for  assessing PA pressure.   3. Left atrial size was mildly dilated.   4. Right atrial size was moderately dilated.   5. The mitral valve is normal in structure. Trivial mitral valve  regurgitation. No evidence of mitral stenosis.   6. The aortic valve has an indeterminant number of cusps. Aortic valve  regurgitation is not visualized. No aortic stenosis is present.    Echo 04/28/19  1. Left ventricular ejection fraction, by estimation, is 50 to 55%. The  left ventricle has low normal function. The left ventricle demonstrates  regional wall motion abnormalities (see scoring diagram/findings for  description). There is mild left  ventricular hypertrophy. Left ventricular diastolic parameters are  indeterminate.   2. Right ventricular systolic function is normal. The right ventricular  size is normal. Tricuspid regurgitation signal is inadequate for assessing  PA pressure.   3. The mitral valve is normal in structure. No evidence of mitral valve  regurgitation. No evidence of mitral stenosis.   4. The aortic valve is normal in structure. Aortic valve regurgitation is  trivial. No aortic stenosis is present.   5. The inferior vena cava is normal in size with greater than 50%  respiratory variability, suggesting right atrial pressure of 3 mmHg.    EKG:  EKG is not ordered today.   Recent Labs: 12/15/2020: ALT 14 01/06/2021: Hemoglobin 12.4;  Platelets 329.0 01/16/2021: BNP 150.5 02/17/2021: BUN 42; Creatinine, Ser 2.98; Potassium 4.2; Sodium 136  Recent Lipid Panel    Component Value Date/Time   CHOL 261 (H) 11/28/2020 0844   TRIG (H) 11/28/2020 0844    507.0 Triglyceride is over 400; calculations on Lipids are invalid.   HDL 42.10 11/28/2020 0844   CHOLHDL 6 11/28/2020 0844  VLDL UNABLE TO CALCULATE IF TRIGLYCERIDE OVER 400 mg/dL 12/17/2012 0145   LDLCALC UNABLE TO CALCULATE IF TRIGLYCERIDE OVER 400 mg/dL 12/17/2012 0145   LDLDIRECT 120.0 11/28/2020 0844     Physical Exam:    VS:  BP 118/64 (BP Location: Left Arm, Patient Position: Sitting, Cuff Size: Normal)    Pulse 85    Ht _0  (1.803 m)    Wt 230 lb 4 oz (104.4 kg)    SpO2 98%    BMI 32.11 kg/m     Wt Readings from Last 3 Encounters:  04/22/21 230 lb 4 oz (104.4 kg)  03/05/21 240 lb (108.9 kg)  03/04/21 241 lb 3.2 oz (109.4 kg)     GEN:  Well nourished, well developed in no acute distress HEENT: Normal NECK: No JVD; No carotid bruits LYMPHATICS: No lymphadenopathy CARDIAC: RRR, no murmurs, rubs, gallops RESPIRATORY:  Clear to auscultation without rales, wheezing or rhonchi  ABDOMEN: Soft, non-tender, non-distended MUSCULOSKELETAL:  No edema; No deformity  SKIN: Warm and dry NEUROLOGIC:  Alert and oriented x 3 PSYCHIATRIC:  Normal affect   ASSESSMENT:    1. Chronic systolic heart failure ([place])   2. Hyperlipidemia LDL goal <70   3. 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 diabetes mellitus ([place])   4. NICM (nonischemic cardiomyopathy) ([place])   5. Hyperlipidemia, mixed   6. OSA (obstructive sleep apnea)   7. Coronary artery disease involving native coronary artery of native heart without angina pectoris    PLAN:    In order of problems listed above:  HFrEF Lower leg edema resolved since the last visit. Echo 12/2020 showed LVEF 40-45%. Continue Coreg 12.53m BID, spironolactone 145mdaily, hydralazine, and Imdur. Cannot use Ace/ARB/ARNI/SGLT2i due to CKD. Continue  torsemide 2027maily.   CKD stage 3 Patient follows with nephrology.  Nonobstructive CAD No anginal symptoms reported. No further ischemic work-up. Continue Aspirin, statin, BB.   HLD LDL 120. I will increase Lipitor to 67m59mily, repeat LFTs/ fasting lipid panel in 6-8 weeks.   OSA Recent sleep study showed severe OSA, he will continue to follow with pulmonology for CPAP machine.   Disposition: Follow up in 6 month(s) with MD/APP     Signed, Karolyna Bianchini H FuNinfa Meeker-C  04/22/2021 8:15 AM    [facility]

## 2021-04-22 ENCOUNTER — Encounter: Payer: Self-pay | Admitting: Medical

## 2021-04-22 ENCOUNTER — Other Ambulatory Visit: Payer: Self-pay

## 2021-04-22 ENCOUNTER — Ambulatory Visit (INDEPENDENT_AMBULATORY_CARE_PROVIDER_SITE_OTHER): Payer: 59 | Admitting: Medical

## 2021-04-22 VITALS — BP 118/64 | HR 85 | Ht 71.0 in | Wt 230.2 lb

## 2021-04-22 DIAGNOSIS — E782 Mixed hyperlipidemia: Secondary | ICD-10-CM

## 2021-04-22 DIAGNOSIS — I428 Other cardiomyopathies: Secondary | ICD-10-CM | POA: Diagnosis not present

## 2021-04-22 DIAGNOSIS — G4733 Obstructive sleep apnea (adult) (pediatric): Secondary | ICD-10-CM

## 2021-04-22 DIAGNOSIS — I2510 Atherosclerotic heart disease of native coronary artery without angina pectoris: Secondary | ICD-10-CM

## 2021-04-22 DIAGNOSIS — E785 Hyperlipidemia, unspecified: Secondary | ICD-10-CM

## 2021-04-22 DIAGNOSIS — E1122 Type 2 diabetes mellitus with diabetic chronic kidney disease: Secondary | ICD-10-CM | POA: Diagnosis not present

## 2021-04-22 DIAGNOSIS — I5022 Chronic systolic (congestive) heart failure: Secondary | ICD-10-CM | POA: Diagnosis not present

## 2021-04-22 DIAGNOSIS — N183 Chronic kidney disease, stage 3 unspecified: Secondary | ICD-10-CM

## 2021-04-22 MED ORDER — ATORVASTATIN CALCIUM 80 MG PO TABS: 80.0000 mg | ORAL_TABLET | Freq: Every day | ORAL | 3 refills | Status: DC

## 2021-04-22 NOTE — Progress Notes (Deleted)
Patient ID: Roberto Allen, male    DOB: 1960-10-14, 61 y.o.   MRN: [id number]  Roberto Allen is a 61 y/o male with a history of CAD, DM, hyperlipidemia, HTN, CKD, COPD, GERD, hyperkalemia, previous tobacco use and chronic heart failure.   Echo report from 12/16/20 reviewed and showed an EF of 40-45% along with moderate LVH,mild LAE and trivial Roberto.   Admitted 12/16/20 due to shortness of breath and pedal edema. Cardiology consult obtained. Medications adjusted. Discharged after 3 days.   He presents today for his follow-up visit with a chief complaint of minimal shortness of breath upon moderate exertion. He says that this has been present for several years and is considered baseline for him. He denies any difficulty sleeping, dizziness, worsening edema, palpitations, chest pain or cough.   During his last visit to the HF clinic, amlodipine was d/c'd and hydralazine was started to possibly help with pedal edema. He saw his nephrologist in December and spironolactone was started. Since these interventions, his legs are what he considers "normal". He has a sleep study later this week for sleep apnea.   He is feeling much better, anticipating starting back to work at the end of the week.   Past Medical History:  Diagnosis Date   CAD (coronary artery disease)    a. LHC 4/13: mD1 25%, oD2 25%, pOM1 25%, pRCA 25%, dRCA 40%, EF 30%   CHF (congestive heart failure) (HCC)    Chronic kidney disease (CKD), stage III (moderate) (HCC)    Chronic systolic heart failure (HCC)    a. EF 30-35% in 04/2011, improved to 55-60% in 01/2012.   COPD (chronic obstructive pulmonary disease) (HCC)    Diabetes mellitus    GERD (gastroesophageal reflux disease)    History of pneumonia    Hyperkalemia    a. 12/2012 - spironolactone, KCl supp discontinued.   Hyperlipidemia    Hypertension    Hypertriglyceridemia    a. 12/2012 - started on fenofibrate.   NICM (nonischemic cardiomyopathy) ([place])    a. Echo 3/13: mild LVH,  EF 30-35%, grade 3 diast dysfxn, mod LAE;  RHC 4/13:  RA 8, RV 58/10, PA 56/18, mean 35, PCWP mean 17, LV 154/27, CO 4.3, CI 2.0. b. EF Improved >>> Echo (01/2012):  Mild LVH, EF 55-60%, no RWMA, Gr 1 DD   Past Surgical History:  Procedure Laterality Date   CARDIAC CATHETERIZATION  2013   non-obs dz 25-40% multiple vessels   TENOTOMY  2018   Family History  Problem Relation Age of Onset   Diabetes Sister    Breast cancer Mother    Breast cancer Maternal Grandmother    Social History   Tobacco Use   Smoking status: Former    Packs/day: 1.00    Years: 20.00    Pack years: 20.00    Types: Cigarettes    Quit date: 09/15/2006    Years since quitting: 14.6   Smokeless tobacco: Never  Substance Use Topics   Alcohol use: Not Currently    Comment: occ   Allergies  Allergen Reactions   Metformin And Related Other (See Comments)    Due to CHF and CRI   Prior to Admission medications   Medication Sig Start Date End Date Taking? Authorizing Provider  ACCU-CHEK FASTCLIX LANCETS MISC USE TO MONITOR BLOOD GLUCOSE 3 TIME(S) DAILY 05/05/17  Yes [provider]  aspirin EC 81 MG EC tablet Take 1 tablet (81 mg total) by mouth daily. 05/02/19  Yes Eulogio Bear  U, DO  atorvastatin (LIPITOR) 20 MG tablet Take 1 tablet (20 mg total) by mouth daily. 01/13/21  Yes Tower, Wynelle Fanny, MD  BD PEN NEEDLE NANO U/F 32G X 4 MM MISC Inject 1 Units into the skin as needed. 11/05/19  Yes Elby Beck, FNP  Blood Glucose Monitoring Suppl (ONETOUCH VERIO REFLECT) w/Device KIT 1 Units by Does not apply route as needed. 11/28/20  Yes Tower, Wynelle Fanny, MD  carvedilol (COREG) 12.5 MG tablet Take 12.5 mg by mouth 2 (two) times daily. 02/02/21  Yes [provider]  glucose blood (ONETOUCH VERIO) test strip Use to check blood sugar 2 times a day 12/24/20  Yes Tower, Wynelle Fanny, MD  HUMALOG KWIKPEN 100 UNIT/ML KwikPen SMARTSIG:15 Unit(s) SUB-Q 3 Times Daily PRN 06/19/19  Yes [provider]   hydrALAZINE (APRESOLINE) 100 MG tablet Take 1 tablet (100 mg total) by mouth 3 (three) times daily. 01/29/21 04/29/21 Yes Hackney, Otila Kluver A, FNP  isosorbide mononitrate (IMDUR) 60 MG 24 hr tablet Take 1 tablet (60 mg total) by mouth 2 (two) times daily. 01/13/21  Yes Tower, Wynelle Fanny, MD  LANTUS SOLOSTAR 100 UNIT/ML Solostar Pen Inject 50 Units into the skin at bedtime. 06/16/20  Yes Tower, Marne A, MD  magnesium oxide (MAG-OX) 400 (240 Mg) MG tablet TAKE 1 TABLET BY MOUTH EVERY DAY 02/04/21  Yes Tower, Wynelle Fanny, MD  omeprazole (PRILOSEC) 20 MG capsule Take 1 capsule (20 mg total) by mouth daily. 12/22/20  Yes Tower, Wynelle Fanny, MD  OZEMPIC, 1 MG/DOSE, 4 MG/3ML SOPN INJECT 1 MG UNDER SKIN ONCE A WEEK 02/05/21  Yes Tower, Wynelle Fanny, MD  spironolactone (ALDACTONE) 25 MG tablet Take 25 mg by mouth daily. 02/02/21  Yes [provider]  tadalafil (CIALIS) 20 MG tablet TAKE ONE TABLET BY MOUTH DAILY AS NEEDED FOR ERECTILE DYSFUNCTION 11/28/20  Yes Tower, Wynelle Fanny, MD  torsemide (DEMADEX) 20 MG tablet Take 1 tablet (20 mg total) by mouth daily. 01/13/21  Yes Tower, Wynelle Fanny, MD  Cholecalciferol (VITAMIN D3) 50 MCG (2000 UT) TABS Take 1 tablet by mouth daily. Patient not taking: Reported on 03/02/2021 07/25/19   [provider]  doxycycline (VIBRA-TABS) 100 MG tablet Take 1 tablet (100 mg total) by mouth 2 (two) times daily. Patient not taking: Reported on 03/02/2021 02/10/21   [name], DPM  ergocalciferol (VITAMIN D2) 1.25 MG (50000 UT) capsule Take 50,000 Units by mouth once a week. Patient not taking: Reported on 03/02/2021    [provider]   Review of Systems  Constitutional:  Positive for fatigue. Negative for appetite change.  HENT:  Negative for congestion, postnasal drip and sore throat.   Eyes: Negative.   Respiratory:  Positive for shortness of breath (at baseline). Negative for cough and chest tightness.        + snoring  Cardiovascular:  Negative for chest pain,  palpitations and leg swelling (currently at baseline).  Gastrointestinal:  Negative for abdominal distention and abdominal pain.  Endocrine: Negative.   Genitourinary: Negative.   Musculoskeletal:  Negative for back pain and neck pain.  Skin: Negative.   Allergic/Immunologic: Negative.   Neurological:  Negative for dizziness and light-headedness.  Hematological:  Negative for adenopathy. Does not bruise/bleed easily.  Psychiatric/Behavioral:  Negative for dysphoric mood and sleep disturbance (sleeping on 2 pillows). The patient is not nervous/anxious.    There were no vitals filed for this visit.  Wt Readings from Last 3 Encounters:  04/22/21 230 lb 4  oz (104.4 kg)  03/05/21 240 lb (108.9 kg)  03/04/21 241 lb 3.2 oz (109.4 kg)   Lab Results  Component Value Date   CREATININE 2.98 (H) 02/17/2021   CREATININE 2.98 (H) 01/29/2021   CREATININE 2.82 (H) 01/16/2021   Physical Exam Vitals and nursing note reviewed. Exam conducted with a chaperone present (wife).  Constitutional:      General: He is not in acute distress.    Appearance: He is well-developed.  HENT:     Head: Normocephalic and atraumatic.  Cardiovascular:     Rate and Rhythm: Normal rate and regular rhythm.     Pulses: Normal pulses.     Heart sounds: Normal heart sounds.  Pulmonary:     Effort: Pulmonary effort is normal. No respiratory distress.     Breath sounds: No wheezing, rhonchi or rales.  Abdominal:     Palpations: Abdomen is soft.     Tenderness: There is no abdominal tenderness.  Musculoskeletal:     Right lower leg: No tenderness. Edema (trace) present.     Left lower leg: No tenderness. Edema (trace) present.  Skin:    General: Skin is warm and dry.  Neurological:     General: No focal deficit present.     Mental Status: He is alert and oriented to person, place, and time.  Psychiatric:        Mood and Affect: Mood normal.        Behavior: Behavior normal.   Assessment & Plan:  1: Chronic  heart failure with reduced ejection fraction- - NYHA class II - euvolemic today - weighing daily; instructed to call for an overnight weight gain of > 2 pounds or a weekly weight gain of > 5 pounds - weight down 4 lbs since last visit, he attributes this to loss of fluids - not adding salt and his wife is not cooking with salt either - saw cardiology Kathlen Mody) 01/16/21 - saw nephrologist (Dr. Marland Kitchen") 02/02/21, spironolactone was added - on GDMT of carvedilol, spironolactone - renal function does not allow for other GDMT  - BNP 01/16/21 was 150  2: HTN with CKD- - BP looks good (126/74)  - saw PCP (Tower) 02/03/21, will see again on 03/05/21 - BMP 02/17/21 reviewed and showed sodium 136, potassium 4.2, creatinine 2.98 & GFR 23 - saw nephrology 02/02/21- spironolactone started  3: DM- - A1c 11/28/20 was 14.7% - glucose was 97 at home this morning  4: Sleep apnea - sleep study this week - reports he rests well, but snores and wakes up having to catch his breath  5: Lymphedema- - much improved and back to his baseline - wife assists him with compression stockings - encouraged to elevate his legs when he's sitting for long periods of time; he tends to sit with his legs down - referral made for compression boots, pt would have $700 OOP cost, based on how good his legs look advised him he can just continue with compression stockings, elevation when possible   Medication list reviewed.   Return in 2 months or sooner for any questions/problems before then.

## 2021-04-22 NOTE — Patient Instructions (Signed)
Medication Instructions:  ?Your physician has recommended you make the following change in your medication:  ? ?INCREASE your atorvastatin (Lipitor) to 80 mg daily  ? ?*If you need a refill on your cardiac medications before your next appointment, please call your pharmacy* ? ? ?Lab Work: ?Your physician recommends that you return for a FASTING lipid profile and LFTs: In 6-8 weeks ? ?- You will need to be fasting. Please do not have anything to eat or drink after midnight the morning you have the lab work. You may only have water or black coffee with no cream or sugar.  ? ?If you have labs (blood work) drawn today and your tests are completely normal, you will receive your results only by: ?MyChart Message (if you have MyChart) OR ?A paper copy in the mail ?If you have any lab test that is abnormal or we need to change your treatment, we will call you to review the results. ? ? ?Testing/Procedures: ?None ordered ? ? ?Follow-Up: ?At [facility], you and your health needs are our priority.  As part of our continuing mission to provide you with exceptional heart care, we have created designated Provider Care Teams.  These Care Teams include your primary Cardiologist (physician) and Advanced Practice Providers (APPs -  Physician Assistants and Nurse Practitioners) who all work together to provide you with the care you need, when you need it. ? ?We recommend signing up for the patient portal called "MyChart".  Sign up information is provided on this After Visit Summary.  MyChart is used to connect with patients for Virtual Visits (Telemedicine).  Patients are able to view lab/test results, encounter notes, upcoming appointments, etc.  Non-urgent messages can be sent to your provider as well.   ?To learn more about what you can do with MyChart, go to [email or web address].   ? ?Your next appointment:   ?6 month(s) ? ?The format for your next appointment:   ?In Person ? ?Provider:   ?You may see one of the following  Advanced Practice Providers on your designated Care Team:   ?Murray Hodgkins, NP ?Christell Faith, PA-C ?Cadence Kathlen Mody, PA-C  ? ? ?Other Instructions ?N/A ?

## 2021-04-23 ENCOUNTER — Ambulatory Visit: Payer: 59 | Admitting: Family

## 2021-04-23 ENCOUNTER — Telehealth: Payer: Self-pay | Admitting: Family

## 2021-04-23 NOTE — Telephone Encounter (Signed)
Patient did not show for his Heart Failure Clinic appointment on 04/23/21. Will attempt to reschedule.   ?

## 2021-04-27 ENCOUNTER — Telehealth: Payer: 59

## 2021-04-29 ENCOUNTER — Telehealth: Payer: Self-pay | Admitting: *Deleted

## 2021-04-29 NOTE — Chronic Care Management (AMB) (Signed)
?  Care Management  ? ?Note ? ?04/29/2021 ?Name: Roberto Allen MRN: [id number] DOB: 11-28-1960 ? ?Roberto Allen is a 61 y.o. year old male who is a primary care patient of Tower, Wynelle Fanny, MD and is actively engaged with the care management team. I reached out to [name] by phone today to assist with re-scheduling an initial visit with the RN Case Manager ? ?Follow up plan: ?Unsuccessful telephone outreach attempt made. A HIPAA compliant phone message was left for the patient providing contact information and requesting a return call.  ? ?Shakerria Parran, CCMA ?Care Guide, Embedded Care Coordination ?[facility]  Care Management  ?Direct Dial: [phone] ? ? ?

## 2021-05-11 ENCOUNTER — Telehealth: Payer: 59

## 2021-05-12 NOTE — Chronic Care Management (AMB) (Signed)
?  Care Management  ? ?Note ? ?05/12/2021 ?Name: [name] MRN: [id number] DOB: November 08, 1960 ? ?Roberto Allen is a 61 y.o. year old male who is a primary care patient of Tower, Wynelle Fanny, MD and is actively engaged with the care management team. I reached out to [name] by phone today to assist with re-scheduling an initial visit with the RN Case Manager ? ?Follow up plan: ?Telephone appointment with care management team member scheduled for: 05/21/2021 ? ?[place] Antolin, CCMA ?Care Guide, Embedded Care Coordination ?[facility]  Care Management  ?Direct Dial: [phone] ? ? ?

## 2021-05-21 ENCOUNTER — Telehealth: Payer: 59

## 2021-05-21 ENCOUNTER — Telehealth: Payer: Self-pay

## 2021-05-21 NOTE — Telephone Encounter (Signed)
?  Care Management  ? ?Follow Up Note ? ? ?05/21/2021 ?Name: Tacuma Graffam MRN: [id number] DOB: 06/07/1960 ? ? ?Referred by: Tower, Wynelle Fanny, MD ?Reason for referral : Care Coordination ? ? ?A second unsuccessful telephone outreach was attempted today. The patient was referred to the case management team for assistance with care management and care coordination.  ? ?Follow Up Plan: A HIPPA compliant phone message was left for the patient providing contact information and requesting a return call.  ?The care management team will attempt telephone outreach to patient within 3 weeks.  ? ?Quinn Plowman RN,BSN,CCM ?RN Case Manager ?[place]  ?[phone] ? ?

## 2021-05-26 ENCOUNTER — Telehealth: Payer: Self-pay | Admitting: Medical

## 2021-05-26 DIAGNOSIS — E1169 Type 2 diabetes mellitus with other specified complication: Secondary | ICD-10-CM

## 2021-05-26 NOTE — Telephone Encounter (Signed)
Lab orders (lipid, lft) updated to be drawn at the [facility] ?

## 2021-05-26 NOTE — Telephone Encounter (Signed)
Patient changing site  to [facility] for fasting labs .  ?

## 2021-05-27 ENCOUNTER — Encounter: Payer: Self-pay | Admitting: Family Medicine

## 2021-05-27 ENCOUNTER — Ambulatory Visit (INDEPENDENT_AMBULATORY_CARE_PROVIDER_SITE_OTHER): Payer: 59 | Admitting: Family Medicine

## 2021-05-27 ENCOUNTER — Ambulatory Visit (INDEPENDENT_AMBULATORY_CARE_PROVIDER_SITE_OTHER)
Admission: RE | Admit: 2021-05-27 | Discharge: 2021-05-27 | Disposition: A | Discharge status: Home / Self Care | Payer: 59 | Source: Ambulatory Visit | Attending: Family Medicine | Admitting: Family Medicine

## 2021-05-27 DIAGNOSIS — M25561 Pain in right knee: Secondary | ICD-10-CM

## 2021-05-27 NOTE — Addendum Note (Signed)
Addended by: Loura Pardon A on: 05/27/2021 08:45 PM ? ? Modules accepted: Orders ? ?

## 2021-05-27 NOTE — Assessment & Plan Note (Signed)
Acute pain without trauma, started at work and worsened with day yesterday, now swelling as well  ?Limited rom due to pain today and also effusion  ? ?Xray ordered-pending report  ?Adv ice/elevation and relative rest  ?Work note to return sat  ?Tylenol prn  ?voltaren gel short term (renal insuf) ? ?Will likely refer once rad interp done ? ?

## 2021-05-27 NOTE — Patient Instructions (Signed)
Xray of knee today  ? ?Elevate and use ice whenever you can  ? ?Tylenol is ok  ?Voltaren gel over the counter as directed is ok for a week ? ?Stay out of work today/tomorrow and Thursday  ? ? ?

## 2021-05-27 NOTE — Progress Notes (Signed)
? ?Subjective:  ? ? Patient ID: Roberto Allen, male    DOB: [birth year], 61 y.o.   MRN: [id number] ? ?HPI ?Pt presents for knee swelling and pain  ? ?Wt Readings from Last 3 Encounters:  ?05/27/21 232 lb 8 oz (105.5 kg)  ?04/22/21 230 lb 4 oz (104.4 kg)  ?03/05/21 240 lb (108.9 kg)  ? ?32.43 kg/m? ? 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 ?No trauma  ? ?Some swelling  ?Not warm ?Not red  ?Some posterior swelling  ?Feels a pulling sensation  ? ?Worse to bend than extend  ?Using a cane /hurts to put weight on it  ? ?Taking tylenol  ?No nsaid due to renal functoin  ? ?Lab Results  ?Component Value Date  ? CREATININE 2.98 (H) 02/17/2021  ? BUN 42 (H) 02/17/2021  ? NA 136 02/17/2021  ? K 4.2 02/17/2021  ? CL 102 02/17/2021  ? CO2 25 02/17/2021  ? ?Patient Active Problem List  ? Diagnosis Date Noted  ? Right knee pain 05/27/2021  ? Dyspnea 03/04/2021  ? Pedal edema 02/03/2021  ? Dilated cardiomyopathy ([place]) 12/19/2020  ? CHF exacerbation ([place]) 12/16/2020  ? Acute on chronic diastolic CHF (congestive heart failure) ([place]) 12/16/2020  ? Hypokalemia 12/16/2020  ? CHF (congestive heart failure) ([place]) 04/28/2019  ? Hypertension secondary to other renal disorders 11/29/2016  ? GERD (gastroesophageal reflux disease) 07/23/2016  ? Degenerative disc disease at L5-S1 level 04/09/2016  ? Primary osteoarthritis of right hip 04/09/2016  ? Obesity, Class I, BMI 30-34.9 02/06/2016  ? Ulcer of left foot ([place]) 01/06/2015  ? Microalbuminuria due to type 2 diabetes mellitus ([place]) 10/03/2014  ? 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 diabetes mellitus ([place]) 06/26/2014  ? NICM (nonischemic cardiomyopathy) ([place]) 09/06/2013  ? ED (erectile dysfunction) 03/27/2013  ? Disorder of kidney and ureter 12/17/2012  ? DM (diabetes mellitus), type 2 with renal complications ([place]) 14/48/1856  ? Coronary Artery Disease (non-obstructive by cath 05/2011) 06/14/2011  ?  Hyperlipidemia associated with type 2 diabetes mellitus ([place]) 06/14/2011  ? OSA (obstructive sleep apnea) 05/25/2011  ? Hypertension   ? ?Past Medical History:  ?Diagnosis Date  ? CAD (coronary artery disease)   ? a. LHC 4/13: mD1 25%, oD2 25%, pOM1 25%, pRCA 25%, dRCA 40%, EF 30%  ? CHF (congestive heart failure) ([place])   ? Chronic kidney disease (CKD), stage III (moderate) (HCC)   ? Chronic systolic heart failure ([place])   ? a. EF 30-35% in 04/2011, improved to 55-60% in 01/2012.  ? COPD (chronic obstructive pulmonary disease) ([place])   ? Diabetes mellitus   ? GERD (gastroesophageal reflux disease)   ? History of pneumonia   ? Hyperkalemia   ? a. 12/2012 - spironolactone, KCl supp discontinued.  ? Hyperlipidemia   ? Hypertension   ? Hypertriglyceridemia   ? a. 12/2012 - started on fenofibrate.  ? NICM (nonischemic cardiomyopathy) ([place])   ? a. Echo 3/13: mild LVH, EF 30-35%, grade 3 diast dysfxn, mod LAE;  RHC 4/13:  RA 8, RV 58/10, PA 56/18, mean 35, PCWP mean 17, LV 154/27, CO 4.3, CI 2.0. b. EF Improved >>> Echo (01/2012):  Mild LVH, EF 55-60%, no RWMA, Gr 1 DD  ? ?Past Surgical History:  ?Procedure Laterality Date  ? CARDIAC CATHETERIZATION  2013  ? non-obs dz 25-40% multiple vessels  ? TENOTOMY  2018  ? ?  Social History  ? ?Tobacco Use  ? Smoking status: Former  ?  Packs/day: 1.00  ?  Years: 20.00  ?  Pack years: 20.00  ?  Types: Cigarettes  ?  Quit date: 09/15/2006  ?  Years since quitting: 14.7  ? Smokeless tobacco: Never  ?Vaping Use  ? Vaping Use: Never used  ?Substance Use Topics  ? Alcohol use: Not Currently  ?  Comment: occ  ? Drug use: No  ? ?Family History  ?Problem Relation Age of Onset  ? Diabetes Sister   ? Breast cancer Mother   ? Breast cancer Maternal Grandmother   ? ?Allergies  ?Allergen Reactions  ? Metformin And Related Other (See Comments)  ?  Due to CHF and CRI  ? ?Current Outpatient Medications on File Prior to Visit  ?Medication Sig Dispense Refill  ? ACCU-CHEK FASTCLIX LANCETS MISC USE TO  MONITOR BLOOD GLUCOSE 3 TIME(S) DAILY  5  ? aspirin EC 81 MG EC tablet Take 1 tablet (81 mg total) by mouth daily.    ? atorvastatin (LIPITOR) 80 MG tablet Take 1 tablet (80 mg total) by mouth daily. 90 tablet 3  ? BD PEN NEEDLE NANO U/F 32G X 4 MM MISC Inject 1 Units into the skin as needed. 100 each 3  ? Blood Glucose Monitoring Suppl (ONETOUCH VERIO REFLECT) w/Device KIT 1 Units by Does not apply route as needed. 1 kit 0  ? carvedilol (COREG) 12.5 MG tablet Take 12.5 mg by mouth 2 (two) times daily.    ? Cholecalciferol (VITAMIN D3) 50 MCG (2000 UT) TABS Take 1 tablet by mouth daily.    ? ergocalciferol (VITAMIN D2) 1.25 MG (50000 UT) capsule Take 50,000 Units by mouth once a week.    ? glucose blood (ONETOUCH VERIO) test strip Use to check blood sugar 2 times a day 100 each 5  ? HUMALOG KWIKPEN 100 UNIT/ML KwikPen SMARTSIG:15 Unit(s) SUB-Q 3 Times Daily PRN    ? isosorbide mononitrate (IMDUR) 60 MG 24 hr tablet Take 1 tablet (60 mg total) by mouth 2 (two) times daily. 180 tablet 1  ? LANTUS SOLOSTAR 100 UNIT/ML Solostar Pen Inject 50 Units into the skin at bedtime. 15 mL 5  ? magnesium oxide (MAG-OX) 400 (240 Mg) MG tablet TAKE 1 TABLET BY MOUTH EVERY DAY 90 tablet 1  ? omeprazole (PRILOSEC) 20 MG capsule Take 1 capsule (20 mg total) by mouth daily. 90 capsule 3  ? OZEMPIC, 1 MG/DOSE, 4 MG/3ML SOPN INJECT 1 MG UNDER SKIN ONCE A WEEK 3 mL 5  ? spironolactone (ALDACTONE) 25 MG tablet Take 25 mg by mouth daily.    ? tadalafil (CIALIS) 20 MG tablet TAKE ONE TABLET BY MOUTH DAILY AS NEEDED FOR ERECTILE DYSFUNCTION 30 tablet 5  ? torsemide (DEMADEX) 20 MG tablet Take 1 tablet (20 mg total) by mouth daily. 90 tablet 1  ? hydrALAZINE (APRESOLINE) 100 MG tablet Take 1 tablet (100 mg total) by mouth 3 (three) times daily. 270 tablet 3  ? [DISCONTINUED] potassium chloride (KLOR-CON) 20 MEQ packet Take 20 mEq by mouth 2 (two) times daily.    ? ?No current facility-administered medications on file prior to visit.  ?   ? ?Review of Systems  ?Constitutional:  Negative for activity change, appetite change, fatigue, fever and unexpected weight change.  ?HENT:  Negative for congestion, rhinorrhea, sore throat and trouble swallowing.   ?Eyes:  Negative for pain, redness, itching and visual disturbance.  ?Respiratory:  Negative for cough,  chest tightness, shortness of breath and wheezing.   ?Cardiovascular:  Negative for chest pain and palpitations.  ?Gastrointestinal:  Negative for abdominal pain, blood in stool, constipation, diarrhea and nausea.  ?Endocrine: Negative for cold intolerance, heat intolerance, polydipsia and polyuria.  ?Genitourinary:  Negative for difficulty urinating, dysuria, frequency and urgency.  ?Musculoskeletal:  Negative for arthralgias, joint swelling and myalgias.  ?     Right knee pain   ?Skin:  Negative for pallor and rash.  ?Neurological:  Negative for dizziness, tremors, weakness, numbness and headaches.  ?Hematological:  Negative for adenopathy. Does not bruise/bleed easily.  ?Psychiatric/Behavioral:  Negative for decreased concentration and dysphoric mood. The patient is not nervous/anxious.   ? ?   ?Objective:  ? Physical Exam ?Constitutional:   ?   General: He is not in acute distress. ?   Appearance: Normal appearance. He is obese. He is not ill-appearing.  ?Cardiovascular:  ?   Rate and Rhythm: Normal rate and regular rhythm.  ?Pulmonary:  ?   Effort: Pulmonary effort is normal. No respiratory distress.  ?Musculoskeletal:     ?   General: No deformity.  ?   Comments: Knee right: ?Moderate swelling with effusion  ?No warmth to the touch , no redness or skin chnage ?No crepitus  ?ROM: limited to 90 deg flex ? ?Ext : full/no apprhenension ?Mcmurray: pain  ?Bounce test : discomfort w/o pain ? ?Stability: nl ?Anterior drawer: nl ?Lachman exam : nl ? ? ?Tenderness medial joint line/patellar tendon and posterior  ? ?Gait : favors LLE ? ?No calf tenderness ?Nl rom akle ? ?   ?Neurological:  ?   Mental  Status: He is alert.  ?   Sensory: No sensory deficit.  ?   Motor: No weakness.  ?Psychiatric:     ?   Mood and Affect: Mood normal.  ? ? ? ? ? ?   ?Assessment & Plan:  ? ?Problem List Items Addressed This Visit   ? ?  ? Other  ?

## 2021-05-28 ENCOUNTER — Encounter: Payer: Self-pay | Admitting: *Deleted

## 2021-06-04 ENCOUNTER — Other Ambulatory Visit: Payer: Self-pay | Admitting: Medical

## 2021-06-04 NOTE — Telephone Encounter (Signed)
Pt requested refill Cialis 20 mg forwarded to nurse for review. ?

## 2021-06-04 NOTE — Telephone Encounter (Signed)
?*  STAT* If patient is at the pharmacy, call can be transferred to refill team. ? ? ?1. Which medications need to be refilled? (please list name of each medication and dose if known) new prescription for Amlodipine 5 mg- need the correct directions ? ?2. Which pharmacy/location (including street and city if local pharmacy) is medication to be sent to?My Pharmacy [facility] ? ?3. Do they need a 30 day or 90 day supply? 90 days and refills ? ?

## 2021-06-04 NOTE — Telephone Encounter (Signed)
I spoke with pt and he verified that he doesn't take amlodipine 5 mg tablet. ?Pt is aware that it isn't on his current medication list or last ov list but that I needed to verify. ?Pt recently changed pharmacies and had all medications transferred to My pharmacy in [place]. ? ?Spoke to pharmacist Milta Deiters and he verified that it may be a old medication that he no longer takes and came through during transfer. ?

## 2021-06-04 NOTE — Telephone Encounter (Signed)
Please review for refill, Thank you. 

## 2021-06-11 ENCOUNTER — Other Ambulatory Visit: Payer: 59

## 2021-06-11 ENCOUNTER — Ambulatory Visit: Payer: 59 | Admitting: Orthopaedic Surgery

## 2021-06-16 ENCOUNTER — Encounter: Payer: Self-pay | Admitting: Family Medicine

## 2021-07-05 ENCOUNTER — Encounter: Payer: Self-pay | Admitting: Family Medicine

## 2021-07-06 ENCOUNTER — Ambulatory Visit: Payer: 59 | Admitting: Family Medicine

## 2021-07-06 NOTE — Telephone Encounter (Signed)
Appt scheduled with PCP to eval pain

## 2021-07-08 ENCOUNTER — Encounter: Payer: Self-pay | Admitting: Family Medicine

## 2021-07-08 ENCOUNTER — Ambulatory Visit (INDEPENDENT_AMBULATORY_CARE_PROVIDER_SITE_OTHER): Payer: 59 | Admitting: Family Medicine

## 2021-07-08 ENCOUNTER — Other Ambulatory Visit: Payer: Self-pay | Admitting: Family Medicine

## 2021-07-08 VITALS — BP 120/68 | HR 78 | Temp 98.5°F | Ht 71.0 in | Wt 233.5 lb

## 2021-07-08 DIAGNOSIS — E1122 Type 2 diabetes mellitus with diabetic chronic kidney disease: Secondary | ICD-10-CM

## 2021-07-08 DIAGNOSIS — E1121 Type 2 diabetes mellitus with diabetic nephropathy: Secondary | ICD-10-CM | POA: Diagnosis not present

## 2021-07-08 DIAGNOSIS — Z794 Long term (current) use of insulin: Secondary | ICD-10-CM

## 2021-07-08 DIAGNOSIS — M10071 Idiopathic gout, right ankle and foot: Secondary | ICD-10-CM | POA: Diagnosis not present

## 2021-07-08 DIAGNOSIS — I5022 Chronic systolic (congestive) heart failure: Secondary | ICD-10-CM

## 2021-07-08 DIAGNOSIS — N184 Chronic kidney disease, stage 4 (severe): Secondary | ICD-10-CM

## 2021-07-08 MED ORDER — PREDNISONE 20 MG PO TABS: ORAL_TABLET | ORAL | 0 refills | Status: DC

## 2021-07-08 NOTE — Progress Notes (Unsigned)
[facility] Strege T. Talisa Petrak, MD, [place] at [facility] [place], [address]  Phone: [phone]  FAX: [phone]  Roberto Allen - 61 y.o. male  MRN [id number]  Date of Birth: [birth year]  Date: 07/08/2021  PCP: Abner Greenspan, MD  Referral: Abner Greenspan, MD  Chief Complaint  Patient presents with   Foot Pain    Left, off and on for two weeks   Subjective:   Roberto Allen is a 61 y.o. very pleasant male patient with Body mass index is 32.57 kg/m. who presents with the following:  Patient is a 61 year old gentleman with heart failure, chronic kidney disease stage IV, insulin-dependent and on multiple medications for heart failure.  Over the last 2 weeks off-and-on he is having some pain in the forefoot on the left at the first MTP joint and to a lesser extent the second MTP joint.  There is been no trauma or accident.  There is also some swelling, warmth, and mild redness.  He is having pain just to touch it and pain with the covers of his bed touching it.  Is also had some pain in the leg today as well.  He does not carry a diagnosis of gout or pseudogout.  Review of Systems is noted in the HPI, as appropriate  Objective:   BP 120/68   Pulse 78   Temp 98.5 F (36.9 C) (Oral)   Ht 5' 11"  (1.803 m)   Wt 233 lb 8 oz (105.9 kg)   SpO2 97%   BMI 32.57 kg/m   GEN: No acute distress; alert,appropriate. PULM: Breathing comfortably in no respiratory distress PSYCH: Normally interactive.   Left foot and ankle: Nontender at the true ankle joint, talus, medial and lateral malleolus.  Nontender at the tibia and fibula.  Homans test is negative. Nontender throughout the entirety of the midfoot as well as the shafts of the metatarsals 1 through 5. Does have focal tenderness at the first MTP joint and mild tenderness at the second MTP joint.  There is some swelling, warmth and redness in this area.  Laboratory and  Imaging Data:    Latest Ref Rng & Units 02/17/2021   10:07 AM 01/29/2021    9:38 AM 01/16/2021   10:24 AM  BMP  Glucose 70 - 99 mg/dL 188   87   179    BUN 8 - 23 mg/dL 42   39   27    Creatinine 0.61 - 1.24 mg/dL 2.98   2.98   2.82    BUN/Creat Ratio 10 - 24   10    Sodium 135 - 145 mmol/L 136   139   144    Potassium 3.5 - 5.1 mmol/L 4.2   3.1   4.0    Chloride 98 - 111 mmol/L 102   102   103    CO2 22 - 32 mmol/L 25   29   24     Calcium 8.9 - 10.3 mg/dL 9.1   9.1   9.3       Assessment and Plan:     ICD-10-CM   1. Acute idiopathic gout involving toe of right foot  M10.071     2. CKD stage 4 due to type 2 diabetes mellitus (HCC)  E11.22    N18.4     3. Chronic systolic congestive heart failure (HCC)  I50.22     4. Type 2 diabetes mellitus with diabetic nephropathy,  with long-term current use of insulin (HCC)  E11.21    Z79.4      Exam and history is consistent with acute gout.  Multiple risk factors include chronic kidney disease stage IV with a GFR in the 20s, and he also takes torsemide daily.  Challenging from a management standpoint in the NSAIDs are contraindicated.  He also has insulin-dependent diabetes.  I think that the least harmful course of action would be to give him a course of some oral steroids, given his A1c is recently been 6.9.  If he does have treatment failure, then renal adjusted low-dose of colchicine could be considered.  Intra-articular injection would also be an appropriate next step.  With his renal function, this may be a challenge off and on indefinitely.  CPPD cannot be excluded, as well.  Medication Management during today's office visit: Meds ordered this encounter  Medications   predniSONE (DELTASONE) 20 MG tablet    Sig: 2 tabs po for 4 days, then 1 tab po for 4 days    Dispense:  12 tablet    Refill:  0   There are no discontinued medications.  Orders placed today for conditions managed today: No orders of the defined types were  placed in this encounter.   Follow-up if needed: No follow-ups on file.  [employer] One speech-to-text software was used for transcription in this dictation.  Possible transcriptional errors can occur using Editor, commissioning.   Signed,  Maud Deed. Roberto Lorson, MD   Outpatient Encounter Medications as of 07/08/2021  Medication Sig   ACCU-CHEK FASTCLIX LANCETS MISC USE TO MONITOR BLOOD GLUCOSE 3 TIME(S) DAILY   aspirin EC 81 MG EC tablet Take 1 tablet (81 mg total) by mouth daily.   atorvastatin (LIPITOR) 80 MG tablet Take 1 tablet (80 mg total) by mouth daily.   BD PEN NEEDLE NANO U/F 32G X 4 MM MISC Inject 1 Units into the skin as needed.   Blood Glucose Monitoring Suppl (ONETOUCH VERIO REFLECT) w/Device KIT 1 Units by Does not apply route as needed.   carvedilol (COREG) 12.5 MG tablet Take 12.5 mg by mouth 2 (two) times daily.   Cholecalciferol (VITAMIN D3) 50 MCG (2000 UT) TABS Take 1 tablet by mouth daily.   glucose blood (ONETOUCH VERIO) test strip Use to check blood sugar 2 times a day   isosorbide mononitrate (IMDUR) 60 MG 24 hr tablet Take 1 tablet (60 mg total) by mouth 2 (two) times daily.   LANTUS SOLOSTAR 100 UNIT/ML Solostar Pen Inject 50 Units into the skin at bedtime.   magnesium oxide (MAG-OX) 400 (240 Mg) MG tablet TAKE 1 TABLET BY MOUTH EVERY DAY   omeprazole (PRILOSEC) 20 MG capsule Take 1 capsule (20 mg total) by mouth daily.   OZEMPIC, 1 MG/DOSE, 4 MG/3ML SOPN INJECT 1 MG UNDER SKIN ONCE A WEEK   predniSONE (DELTASONE) 20 MG tablet 2 tabs po for 4 days, then 1 tab po for 4 days   tadalafil (CIALIS) 20 MG tablet TAKE ONE TABLET BY MOUTH DAILY AS NEEDED FOR ERECTILE DYSFUNCTION   torsemide (DEMADEX) 20 MG tablet Take 1 tablet (20 mg total) by mouth daily.   ergocalciferol (VITAMIN D2) 1.25 MG (50000 UT) capsule Take 50,000 Units by mouth once a week. (Patient not taking: Reported on 07/08/2021)   HUMALOG KWIKPEN 100 UNIT/ML KwikPen SMARTSIG:15 Unit(s) SUB-Q 3 Times Daily  PRN (Patient not taking: Reported on 07/08/2021)   hydrALAZINE (APRESOLINE) 100 MG tablet Take 1 tablet (100  mg total) by mouth 3 (three) times daily.   spironolactone (ALDACTONE) 25 MG tablet Take 25 mg by mouth daily. (Patient not taking: Reported on 07/08/2021)   [DISCONTINUED] potassium chloride (KLOR-CON) 20 MEQ packet Take 20 mEq by mouth 2 (two) times daily.   No facility-administered encounter medications on file as of 07/08/2021.

## 2021-07-09 ENCOUNTER — Encounter: Payer: Self-pay | Admitting: Family Medicine

## 2021-07-14 ENCOUNTER — Other Ambulatory Visit: Payer: Self-pay | Admitting: Family Medicine

## 2021-07-19 ENCOUNTER — Encounter: Payer: Self-pay | Admitting: Family Medicine

## 2021-07-20 MED ORDER — ATORVASTATIN CALCIUM 80 MG PO TABS: 80.0000 mg | ORAL_TABLET | Freq: Every day | ORAL | 3 refills | Status: AC

## 2021-07-20 MED ORDER — OMEPRAZOLE 20 MG PO CPDR: 20.0000 mg | DELAYED_RELEASE_CAPSULE | Freq: Every day | ORAL | 3 refills | Status: AC

## 2021-08-13 ENCOUNTER — Other Ambulatory Visit: Payer: Self-pay | Admitting: Family Medicine

## 2021-08-13 DIAGNOSIS — Z794 Long term (current) use of insulin: Secondary | ICD-10-CM

## 2021-08-14 NOTE — Progress Notes (Signed)
This encounter was created in error - please disregard.

## 2021-10-22 ENCOUNTER — Ambulatory Visit: Payer: 59 | Attending: Medical | Admitting: Medical

## 2021-10-22 NOTE — Progress Notes (Deleted)
Cardiology Office Note:    Date:  10/22/2021   ID:  Roberto Allen, DOB 05-Dec-1960, MRN [id number]  PCP:  Abner Greenspan, MD  [facility] Cardiologist:  Sanda Klein, MD  [facility] Electrophysiologist:  None   Referring MD: Abner Greenspan, MD   Chief Complaint: 6 month follow-up  History of Present Illness:    Roberto Allen is a 61 y.o. male with a hx of nonobstructive CAD, HFmrEF, CKD stage 3b, HTN, HLD, DM2, OSA, COPD, GERD who presents for follow-up.    Cardiac history dates back to 2013 when he was seen for the evaluation of dyspnea. Echo showed an EF of 30-35%, diffuse hypokinesis, moderately to severely reduced systolic function, grade 3 diastolic dysfunction, and moderate LAE. He underwent a cardiac catheterization in 05/2011, which showed non-obstructive CAD (mD1 25%, oD2 25%, pOM1 25%, pRCA 25%, dRCA 40%). Follow-up echo of 01/2012 showed an improved EF of 55%. He was hospitalized in 12/2012 for chest pain. Lexiscan myoview on 12/17/2012 showed no evidence of ischemia.    He was admitted to the hospital again in March 2021 with an acute exacerbation of HFimpEF, which was complicated by severely elevated blood pressure and acute renal insufficiency, limiting GDMT. Cardiac catheterization was deferred in the setting of CKD. Echo at the time showed a stable EF of 50-55%. He was seen in the office on 08/27/2020 and was doing well overall on maximally tolerated GDMT, with NYHA class II-III symptoms.    He returned to the ED on 12/15/2020, and was admitted from 12/15/2020-12/19/2020 for worsening shortness of breath, leg swelling, and severely elevated blood pressure in the setting of HFmrEF. Repeat echo showed a reduced EF of 40-45% (down from 50-55% in 2021). Repeat stress test showed no ischemia. He was treated with IV lasix with improvement in CHF symptoms. Escalation of GDMT was limited by CKD. He was discharged on amlodipine, atorvastatin, carvedilol, hydralazine, isosorbide  mononitrate, empagliflozin, losartan, and torsemide. He saw his PCP for follow-up on 12/22/2020, follow-up BUN 37,  sCr 3.19 (up from 34/2.65 at hospital discharge). He was continued on his medications and strongly encouraged to follow-up with neprhology. Additionally, he was seen in the HF clinic on 12/26/2020. He was euvolemic at the time despite a 4 pound weight gain since hospital discharge, and showed improved blood pressure. GDMT continued with carvedilol, jardiance and losartan, hydralazine/isosorbide, and torsemide.    The patient was seen 12/30/20 and was overall doing well from a cardiac standpoint. Losartan was stopped for worsening renal function. Hydralazine was increased. Labs were drawn. Seen back in clinic and amlodipine was decreased.    Last seen 04/2021 and was doing well since the last visit. Lipitor was increased to 80mg  daily.   Today lipids  Past Medical History:  Diagnosis Date   CAD (coronary artery disease)    a. LHC 4/13: mD1 25%, oD2 25%, pOM1 25%, pRCA 25%, dRCA 40%, EF 30%   CHF (congestive heart failure) (HCC)    Chronic kidney disease (CKD), stage III (moderate) (HCC)    Chronic systolic heart failure (HCC)    a. EF 30-35% in 04/2011, improved to 55-60% in 01/2012.   COPD (chronic obstructive pulmonary disease) (HCC)    Diabetes mellitus    GERD (gastroesophageal reflux disease)    History of pneumonia    Hyperkalemia    a. 12/2012 - spironolactone, KCl supp discontinued.   Hyperlipidemia    Hypertension    Hypertriglyceridemia    a. 12/2012 - started on fenofibrate.  NICM (nonischemic cardiomyopathy) ([place])    a. Echo 3/13: mild LVH, EF 30-35%, grade 3 diast dysfxn, mod LAE;  RHC 4/13:  RA 8, RV 58/10, PA 56/18, mean 35, PCWP mean 17, LV 154/27, CO 4.3, CI 2.0. b. EF Improved >>> Echo (01/2012):  Mild LVH, EF 55-60%, no RWMA, Gr 1 DD    Past Surgical History:  Procedure Laterality Date   CARDIAC CATHETERIZATION  2013   non-obs dz 25-40% multiple vessels    TENOTOMY  2018    Current Medications: No outpatient medications have been marked as taking for the 10/22/21 encounter (Appointment) with Kathlen Mody, Tonye Tancredi H, PA-C.     Allergies:   Metformin and related   Social History   Socioeconomic History   Marital status: Married    Spouse name: Not on file   Number of children: 6   Years of education: Not on file   Highest education level: Not on file  Occupational History   Occupation: [employer]    Comment: Dentist: [place]  Tobacco Use   Smoking status: Former    Packs/day: 1.00    Years: 20.00    Total pack years: 20.00    Types: Cigarettes    Quit date: 09/15/2006    Years since quitting: 15.1   Smokeless tobacco: Never  Vaping Use   Vaping Use: Never used  Substance and Sexual Activity   Alcohol use: Not Currently    Comment: occ   Drug use: No   Sexual activity: Yes  Other Topics Concern   Not on file  Social History Narrative   Lives with wife.   Social Determinants of Health   Financial Resource Strain: Not on file  Food Insecurity: Not on file  Transportation Needs: Not on file  Physical Activity: Not on file  Stress: Not on file  Social Connections: Not on file     Family History: The patient's family history includes Breast cancer in his maternal grandmother and mother; Diabetes in his sister.  ROS:   Please see the history of present illness.     All other systems reviewed and are negative.  EKGs/Labs/Other Studies Reviewed:    The following studies were reviewed today:  Myoview Lexiscan 12/18/20 Narrative & Impression  Pharmacological myocardial perfusion imaging study with no significant  ischemia Global hypokinesis , EF estimated at 33% No EKG changes concerning for ischemia at peak stress or in recovery. CT attenuation correction images with proximal/mid LAD calcification, distal RCA, no significant aortic atherosclerosis Low risk scan     Signed, Esmond Plants, MD,  Ph.D [facility]      Echo 12/16/20    1. Left ventricular ejection fraction, by estimation, is 40 to 45%. The  left ventricle has mildly decreased function. The left ventricle  demonstrates global hypokinesis. There is more severe hypokinesis of the  basal inferior/inferolateral segments.  There is moderate left ventricular hypertrophy. Left ventricular diastolic  parameters are consistent with Grade I diastolic dysfunction (impaired  relaxation).   2. Right ventricular systolic function is normal. The right ventricular  size is mildly enlarged. Tricuspid regurgitation signal is inadequate for  assessing PA pressure.   3. Left atrial size was mildly dilated.   4. Right atrial size was moderately dilated.   5. The mitral valve is normal in structure. Trivial mitral valve  regurgitation. No evidence of mitral stenosis.   6. The aortic valve has an indeterminant number of cusps. Aortic valve  regurgitation  is not visualized. No aortic stenosis is present.    Echo 04/28/19  1. Left ventricular ejection fraction, by estimation, is 50 to 55%. The  left ventricle has low normal function. The left ventricle demonstrates  regional wall motion abnormalities (see scoring diagram/findings for  description). There is mild left  ventricular hypertrophy. Left ventricular diastolic parameters are  indeterminate.   2. Right ventricular systolic function is normal. The right ventricular  size is normal. Tricuspid regurgitation signal is inadequate for assessing  PA pressure.   3. The mitral valve is normal in structure. No evidence of mitral valve  regurgitation. No evidence of mitral stenosis.   4. The aortic valve is normal in structure. Aortic valve regurgitation is  trivial. No aortic stenosis is present.   5. The inferior vena cava is normal in size with greater than 50%  respiratory variability, suggesting right atrial pressure of 3 mmHg.      EKG:  EKG is *** ordered today.  The ekg  ordered today demonstrates ***  Recent Labs: 12/15/2020: ALT 14 01/06/2021: Hemoglobin 12.4; Platelets 329.0 01/16/2021: BNP 150.5 02/17/2021: BUN 42; Creatinine, Ser 2.98; Potassium 4.2; Sodium 136  Recent Lipid Panel    Component Value Date/Time   CHOL 261 (H) 11/28/2020 0844   TRIG (H) 11/28/2020 0844    507.0 Triglyceride is over 400; calculations on Lipids are invalid.   HDL 42.10 11/28/2020 0844   CHOLHDL 6 11/28/2020 0844   VLDL UNABLE TO CALCULATE IF TRIGLYCERIDE OVER 400 mg/dL 12/17/2012 0145   LDLCALC UNABLE TO CALCULATE IF TRIGLYCERIDE OVER 400 mg/dL 12/17/2012 0145   LDLDIRECT 120.0 11/28/2020 0844     Risk Assessment/Calculations:   {Does this patient have ATRIAL FIBRILLATION?:[id number]}   Physical Exam:    VS:  There were no vitals taken for this visit.    Wt Readings from Last 3 Encounters:  07/08/21 233 lb 8 oz (105.9 kg)  05/27/21 232 lb 8 oz (105.5 kg)  04/22/21 230 lb 4 oz (104.4 kg)     GEN: *** Well nourished, well developed in no acute distress HEENT: Normal NECK: No JVD; No carotid bruits LYMPHATICS: No lymphadenopathy CARDIAC: ***RRR, no murmurs, rubs, gallops RESPIRATORY:  Clear to auscultation without rales, wheezing or rhonchi  ABDOMEN: Soft, non-tender, non-distended MUSCULOSKELETAL:  No edema; No deformity  SKIN: Warm and dry NEUROLOGIC:  Alert and oriented x 3 PSYCHIATRIC:  Normal affect   ASSESSMENT:    No diagnosis found. PLAN:    In order of problems listed above:  HFrEF  CKD stage 3  Nonobstructive CAD  HLD  OSA  Disposition: Follow up {follow up:15908} with ***   Shared Decision Making/Informed Consent   {Are you ordering a CV Procedure (e.g. stress test, cath, DCCV, TEE, etc)?   Press F2        :[id number]}    Signed, Chastin Garlitz Ninfa Meeker, PA-C  10/22/2021 8:08 AM    [facility]

## 2021-10-23 ENCOUNTER — Encounter: Payer: Self-pay | Admitting: Medical

## 2021-11-24 ENCOUNTER — Other Ambulatory Visit: Payer: Self-pay | Admitting: Physician Assistant

## 2021-11-24 ENCOUNTER — Other Ambulatory Visit: Payer: Self-pay | Admitting: Family Medicine

## 2021-11-24 DIAGNOSIS — Z794 Long term (current) use of insulin: Secondary | ICD-10-CM

## 2021-11-24 NOTE — Telephone Encounter (Signed)
LMOV  

## 2021-11-24 NOTE — Telephone Encounter (Signed)
Please schedule overdue 6 month F/U appointment for 90 day refills. Thank you!

## 2021-11-25 NOTE — Telephone Encounter (Signed)
He sees endocrinology now Dr Hartford Poli I think this needs to be sent there Thanks

## 2021-11-25 NOTE — Telephone Encounter (Signed)
Ozempic last filled on 02/05/21 #3 mL with 5 refills Lantus filled on 06/16/20 15 mL with 5 refills  Last OV with PCP was for leg swelling on 05/27/21

## 2021-11-27 NOTE — Telephone Encounter (Signed)
Scheduled

## 2021-12-28 ENCOUNTER — Other Ambulatory Visit: Payer: Self-pay | Admitting: Family Medicine

## 2021-12-29 NOTE — Telephone Encounter (Signed)
Pt has had a few acute appts recently but no f/u or CPEs and no future appts., please advise

## 2021-12-29 NOTE — Telephone Encounter (Signed)
Please schedule annual exam this winter

## 2021-12-30 NOTE — Telephone Encounter (Signed)
Spoke to pt, scheduled pt for 02/01/22

## 2022-01-01 ENCOUNTER — Telehealth: Payer: Self-pay

## 2022-01-01 ENCOUNTER — Telehealth: Payer: Self-pay | Admitting: Cardiovascular Disease

## 2022-01-01 NOTE — Telephone Encounter (Signed)
   Name: Acen Craun  DOB: 05-11-1960  MRN: [id number]  Primary Cardiologist: Sanda Klein, MD  Chart reviewed as part of pre-operative protocol coverage. The patient has an upcoming visit scheduled with Cadence Furth, PA on 01/14/2022 at which time clearance can be addressed in case there are any issues that would impact surgical recommendations.  Colonoscopy is not scheduled until TBD as below. I added preop FYI to appointment note so that provider is aware to address at time of outpatient visit.  Per office protocol the cardiology provider should forward their finalized clearance decision and recommendations regarding antiplatelet therapy to the requesting party below.    I will route this message as FYI to requesting party and remove this message from the preop box as separate preop APP input not needed at this time.   Please call with any questions.  Lenna Sciara, NP  01/01/2022, 12:34 PM

## 2022-01-01 NOTE — Telephone Encounter (Signed)
   Pre-operative Risk Assessment    Patient Name: Roberto Allen  DOB: 31-Aug-1960 MRN: [id number]      Request for Surgical Clearance    Procedure:   CAD/CHF  Date of Surgery:  Clearance TBD                                 Surgeon:  DR Hillary Bow MENON Surgeon's Group or Practice Name:  [facility] Phone number:  [phone] Fax number:  [phone]   Type of Clearance Requested:   - Medical Patients needs the following prior to risk stratification, Patient is at low risk for procedure from a  cardiac standpoint. Patient is at increased risk but not prohibitive risk from a cardiac standpoint. To minimize risk we recommend the following. Patient is a prohibitive risk from a neurologic standpoint for the above procdure   Type of Anesthesia:  MAC   Additional requests/questions:  Please fax back Cardiac Clearance to [phone]  Signed, Ajdin Macke E Dameon Soltis  CCMA 01/01/2022, 4:34 PM

## 2022-01-01 NOTE — Telephone Encounter (Signed)
   Pre-operative Risk Assessment    Patient Name: Roberto Allen  DOB: 17-Feb-1960 MRN: [id number]      Request for Surgical Clearance    Procedure:   Colonoscopy   Date of Surgery:  Clearance TBD                                 Surgeon:  Dr. Gerri Spore  Surgeon's Group or Practice Name:  [facility]  Phone number:  [phone] Fax number:  [phone]   Type of Clearance Requested:   - Medical    Type of Anesthesia:  MAC   Additional requests/questions:    Signed, April Henson   01/01/2022, 9:26 AM

## 2022-01-04 NOTE — Telephone Encounter (Signed)
This seems to be a duplicate, though procedure was not entered in correctly. However, see 01/01/22 clearance notes entered in by April Henson correctly; procedure is colonoscopy. Pt has appt 01/14/22 Cadence Furth, PAC. I will forward notes to [facility] for upcoming appt.

## 2022-01-04 NOTE — Telephone Encounter (Signed)
Preoperative team, please contact requesting office and verify details of upcoming surgery.  CAD, CHF are terminology for coronary artery disease and congestive heart failure.  Please clarify procedure.  Thank you for your help.  Jossie Ng. Brendia Dampier NP-C     01/04/2022, 7:31 AM [address] Office [phone] Fax [phone]

## 2022-01-14 ENCOUNTER — Ambulatory Visit: Payer: 59 | Attending: Medical | Admitting: Medical

## 2022-01-14 ENCOUNTER — Telehealth: Payer: Self-pay

## 2022-01-14 ENCOUNTER — Encounter: Payer: Self-pay | Admitting: Medical

## 2022-01-14 ENCOUNTER — Other Ambulatory Visit: Payer: Self-pay | Admitting: Podiatry

## 2022-01-14 VITALS — BP 122/54 | HR 70 | Ht 71.0 in | Wt 243.0 lb

## 2022-01-14 DIAGNOSIS — I5022 Chronic systolic (congestive) heart failure: Secondary | ICD-10-CM

## 2022-01-14 DIAGNOSIS — Z01810 Encounter for preprocedural cardiovascular examination: Secondary | ICD-10-CM

## 2022-01-14 DIAGNOSIS — G4733 Obstructive sleep apnea (adult) (pediatric): Secondary | ICD-10-CM | POA: Diagnosis not present

## 2022-01-14 DIAGNOSIS — E785 Hyperlipidemia, unspecified: Secondary | ICD-10-CM

## 2022-01-14 DIAGNOSIS — I428 Other cardiomyopathies: Secondary | ICD-10-CM | POA: Diagnosis not present

## 2022-01-14 MED ORDER — DOXYCYCLINE HYCLATE 100 MG PO TABS: 100.0000 mg | ORAL_TABLET | Freq: Two times a day (BID) | ORAL | 0 refills | Status: DC

## 2022-01-14 NOTE — Progress Notes (Signed)
Cardiology Office Note:    Date:  01/15/2022   ID:  Roberto Allen, DOB 11/06/1960, MRN [id number]  PCP:  Abner Greenspan, MD  [facility] Cardiologist:  Sanda Klein, MD  [facility] Electrophysiologist:  None   Referring MD: Abner Greenspan, MD   Chief Complaint: 6 month follow-up  History of Present Illness:    Roberto Allen is a 61 y.o. male with a hx of nonobstructive CAD, HFmrEF, CKD stage 3b, HTN, HLD, DM2, OSA, COPD, GERD who presents for follow-up.    Cardiac history dates back to 2013 when he was seen for the evaluation of dyspnea. Echo showed an EF of 30-35%, diffuse hypokinesis, moderately to severely reduced systolic function, grade 3 diastolic dysfunction, and moderate LAE. He underwent a cardiac catheterization in 05/2011, which showed non-obstructive CAD (mD1 25%, oD2 25%, pOM1 25%, pRCA 25%, dRCA 40%). Follow-up echo of 01/2012 showed an improved EF of 55%. He was hospitalized in 12/2012 for chest pain. Lexiscan myoview on 12/17/2012 showed no evidence of ischemia.    He was admitted to the hospital again in March 2021 with an acute exacerbation of HFimpEF, which was complicated by severely elevated blood pressure and acute renal insufficiency, limiting GDMT. Cardiac catheterization was deferred in the setting of CKD. Echo at the time showed a stable EF of 50-55%. He was seen in the office on 08/27/2020 and was doing well overall on maximally tolerated GDMT, with NYHA class II-III symptoms.    He returned to the ED on 12/15/2020, and was admitted from 12/15/2020-12/19/2020 for worsening shortness of breath, leg swelling, and severely elevated blood pressure in the setting of HFmrEF. Repeat echo showed a reduced EF of 40-45% (down from 50-55% in 2021). Repeat stress test showed no ischemia. He was treated with IV lasix with improvement in CHF symptoms. Escalation of GDMT was limited by CKD. He was discharged on amlodipine, atorvastatin, carvedilol, hydralazine, isosorbide  mononitrate, empagliflozin, losartan, and torsemide. He saw his PCP for follow-up on 12/22/2020, follow-up BUN 37,  sCr 3.19 (up from 34/2.65 at hospital discharge). He was continued on his medications and strongly encouraged to follow-up with neprhology. Additionally, he was seen in the HF clinic on 12/26/2020. He was euvolemic at the time despite a 4 pound weight gain since hospital discharge, and showed improved blood pressure. GDMT continued with carvedilol, jardiance and losartan, hydralazine/isosorbide, and torsemide.    The patient was seen 12/30/20 and was overall doing well from a cardiac standpoint. Losartan was stopped for worsening renal function. Hydralazine was increased. Labs were drawn. Seen back in clinic and amlodipine was decreased.    He was seen 01/16/21 with persistent lower leg edema. Torsemide was increased for 3 days.  Last seen 04/22/21 and was doing well from a cardiac perspective. Lower leg edema resolved.   Today, the patient reports intermittent lower leg edema. He takes extra torsemide 20mg  twice daily. He takes spironolactone he takes as needed. Kidney specialist changed the dosing. He has occasional shortness of breath on exertion. This is unchanged. No chest pain. He takes daily weights. Patient is going to undergo colonoscopy next year, needs cardiology clearance.  Past Medical History:  Diagnosis Date   CAD (coronary artery disease)    a. LHC 4/13: mD1 25%, oD2 25%, pOM1 25%, pRCA 25%, dRCA 40%, EF 30%   CHF (congestive heart failure) (HCC)    Chronic kidney disease (CKD), stage III (moderate) (HCC)    Chronic systolic heart failure ([place])    a. EF  30-35% in 04/2011, improved to 55-60% in 01/2012.   COPD (chronic obstructive pulmonary disease) (HCC)    Diabetes mellitus    GERD (gastroesophageal reflux disease)    History of pneumonia    Hyperkalemia    a. 12/2012 - spironolactone, KCl supp discontinued.   Hyperlipidemia    Hypertension    Hypertriglyceridemia     a. 12/2012 - started on fenofibrate.   NICM (nonischemic cardiomyopathy) ([place])    a. Echo 3/13: mild LVH, EF 30-35%, grade 3 diast dysfxn, mod LAE;  RHC 4/13:  RA 8, RV 58/10, PA 56/18, mean 35, PCWP mean 17, LV 154/27, CO 4.3, CI 2.0. b. EF Improved >>> Echo (01/2012):  Mild LVH, EF 55-60%, no RWMA, Gr 1 DD    Past Surgical History:  Procedure Laterality Date   CARDIAC CATHETERIZATION  2013   non-obs dz 25-40% multiple vessels   TENOTOMY  2018    Current Medications: Current Meds  Medication Sig   allopurinol (ZYLOPRIM) 100 MG tablet Take 100 mg by mouth daily.   aspirin EC 81 MG EC tablet Take 1 tablet (81 mg total) by mouth daily.   atorvastatin (LIPITOR) 80 MG tablet Take 1 tablet (80 mg total) by mouth daily.   carvedilol (COREG) 12.5 MG tablet Take 12.5 mg by mouth 2 (two) times daily.   HUMALOG KWIKPEN 100 UNIT/ML KwikPen    hydrALAZINE (APRESOLINE) 100 MG tablet Take 1 tablet (100 mg total) by mouth 3 (three) times daily.   isosorbide mononitrate (IMDUR) 60 MG 24 hr tablet TAKE 1 TABLET BY MOUTH TWICE A DAY   JARDIANCE 25 MG TABS tablet Take 25 mg by mouth daily.   LANTUS SOLOSTAR 100 UNIT/ML Solostar Pen Inject 50 Units into the skin at bedtime.   magnesium oxide (MAG-OX) 400 (240 Mg) MG tablet TAKE 1 TABLET BY MOUTH EVERY DAY   omeprazole (PRILOSEC) 20 MG capsule Take 1 capsule (20 mg total) by mouth daily.   spironolactone (ALDACTONE) 25 MG tablet Take 25 mg by mouth daily.   tadalafil (CIALIS) 20 MG tablet TAKE ONE TABLET BY MOUTH DAILY AS NEEDED FOR ERECTILE DYSFUNCTION   torsemide (DEMADEX) 20 MG tablet Take 1 tablet (20 mg total) by mouth daily.     Allergies:   Metformin and related   Social History   Socioeconomic History   Marital status: Married    Spouse name: Not on file   Number of children: 6   Years of education: Not on file   Highest education level: Not on file  Occupational History   Occupation: [employer]    Comment: Dentist:  [employer]  Tobacco Use   Smoking status: Former    Packs/day: 1.00    Years: 20.00    Total pack years: 20.00    Types: Cigarettes    Quit date: 09/15/2006    Years since quitting: 15.3   Smokeless tobacco: Never  Vaping Use   Vaping Use: Never used  Substance and Sexual Activity   Alcohol use: Not Currently    Comment: occ   Drug use: No   Sexual activity: Yes  Other Topics Concern   Not on file  Social History Narrative   Lives with wife.   Social Determinants of Health   Financial Resource Strain: Not on file  Food Insecurity: Not on file  Transportation Needs: Not on file  Physical Activity: Not on file  Stress: Not on file  Social Connections: Not on file  Family History: The patient's family history includes Breast cancer in his maternal grandmother and mother; Diabetes in his sister.  ROS:   Please see the history of present illness.     All other systems reviewed and are negative.  EKGs/Labs/Other Studies Reviewed:    The following studies were reviewed today:  Myoview Lexiscan 12/18/20 Narrative & Impression  Pharmacological myocardial perfusion imaging study with no significant  ischemia Global hypokinesis , EF estimated at 33% No EKG changes concerning for ischemia at peak stress or in recovery. CT attenuation correction images with proximal/mid LAD calcification, distal RCA, no significant aortic atherosclerosis Low risk scan     Signed, Esmond Plants, MD, Ph.D [facility]      Echo 12/16/20    1. Left ventricular ejection fraction, by estimation, is 40 to 45%. The  left ventricle has mildly decreased function. The left ventricle  demonstrates global hypokinesis. There is more severe hypokinesis of the  basal inferior/inferolateral segments.  There is moderate left ventricular hypertrophy. Left ventricular diastolic  parameters are consistent with Grade I diastolic dysfunction (impaired  relaxation).   2. Right ventricular systolic  function is normal. The right ventricular  size is mildly enlarged. Tricuspid regurgitation signal is inadequate for  assessing PA pressure.   3. Left atrial size was mildly dilated.   4. Right atrial size was moderately dilated.   5. The mitral valve is normal in structure. Trivial mitral valve  regurgitation. No evidence of mitral stenosis.   6. The aortic valve has an indeterminant number of cusps. Aortic valve  regurgitation is not visualized. No aortic stenosis is present.    Echo 04/28/19  1. Left ventricular ejection fraction, by estimation, is 50 to 55%. The  left ventricle has low normal function. The left ventricle demonstrates  regional wall motion abnormalities (see scoring diagram/findings for  description). There is mild left  ventricular hypertrophy. Left ventricular diastolic parameters are  indeterminate.   2. Right ventricular systolic function is normal. The right ventricular  size is normal. Tricuspid regurgitation signal is inadequate for assessing  PA pressure.   3. The mitral valve is normal in structure. No evidence of mitral valve  regurgitation. No evidence of mitral stenosis.   4. The aortic valve is normal in structure. Aortic valve regurgitation is  trivial. No aortic stenosis is present.   5. The inferior vena cava is normal in size with greater than 50%  respiratory variability, suggesting right atrial pressure of 3 mmHg.       EKG:  EKG is ordered today.  The ekg ordered today demonstrates NSR 1st degree AV block, LAD, PAC  Recent Labs: 01/16/2021: BNP 150.5 02/17/2021: BUN 42; Creatinine, Ser 2.98; Potassium 4.2; Sodium 136  Recent Lipid Panel    Component Value Date/Time   CHOL 261 (H) 11/28/2020 0844   TRIG (H) 11/28/2020 0844    507.0 Triglyceride is over 400; calculations on Lipids are invalid.   HDL 42.10 11/28/2020 0844   CHOLHDL 6 11/28/2020 0844   VLDL UNABLE TO CALCULATE IF TRIGLYCERIDE OVER 400 mg/dL 12/17/2012 0145   LDLCALC UNABLE TO  CALCULATE IF TRIGLYCERIDE OVER 400 mg/dL 12/17/2012 0145   LDLDIRECT 120.0 11/28/2020 0844      Physical Exam:    VS:  BP (!) 122/54 (BP Location: Left Arm, Patient Position: Sitting, Cuff Size: Normal)   Pulse 70   Ht 5\' 11"  (1.803 m)   Wt 243 lb (110.2 kg)   SpO2 95%   BMI 33.89 kg/m  Wt Readings from Last 3 Encounters:  01/14/22 243 lb (110.2 kg)  07/08/21 233 lb 8 oz (105.9 kg)  05/27/21 232 lb 8 oz (105.5 kg)     GEN:  Well nourished, well developed in no acute distress HEENT: Normal NECK: No JVD; No carotid bruits LYMPHATICS: No lymphadenopathy CARDIAC: RRR, no murmurs, rubs, gallops RESPIRATORY:  Clear to auscultation without rales, wheezing or rhonchi  ABDOMEN: Soft, non-tender, non-distended MUSCULOSKELETAL:  trace lower edema; No deformity  SKIN: Warm and dry NEUROLOGIC:  Alert and oriented x 3 PSYCHIATRIC:  Normal affect   ASSESSMENT:    1. Pre-operative cardiovascular examination   2. Chronic systolic heart failure ([place])   3. NICM (nonischemic cardiomyopathy) ([place])   4. OSA (obstructive sleep apnea)   5. Hyperlipidemia LDL goal <70    PLAN:    In order of problems listed above:  HFrEF NICM Patient reports intermittent lower leg edema, today no edema is appreciated. Nephrology manages his diuretics. He is taking Torsemide 20mg  BID and spironolactone 25mg  as needed for lower leg edema. Continue Hydralazine, Jardiance, spironolactone, and Coreg.  CKD stage 3 Patient follows closely with nephrology.   Nonobstructive CAD The patient denies chest pain. He has chronic dyspnea on exertion that is unchanged. No further ischemic work-up indicated. Continue Aspirin, Imdur, statin and BB.   HLD LDL 120. Continue Lipitor 80mg  daily.   OSA The patient has not heard back regarding his CPAP machine. I recommended he follow-up with this.  Cardiac pre-operative evaluation Plan on colonoscopy early next year. Patient is stable from a cardiac perspective. He  is euvolemic on exam. He takes torsemide as instructed by nephrology. He denies chest pain. He has DOE. Echo from 12/2020 showed LVEF 40-45%, global HK, G1DD, trivial MR. No further work-up from a cardiac perspective prior to colonoscopy. OK to stop Aspirin in the peri-operative period. METs>4. According to the Revised Cardiac Risk Index patient is Class IV risk, 15% 30 day risk of death, MI, or cardiac arrest.  Disposition: Follow up in 6 month(s) with MD   Signed, Lenya Sterne Ninfa Meeker, PA-C  01/15/2022 1:36 PM    [facility]

## 2022-01-14 NOTE — Patient Instructions (Signed)
Medication Instructions:  - Your physician recommends that you continue on your current medications as directed. Please refer to the Current Medication list given to you today.  *If you need a refill on your cardiac medications before your next appointment, please call your pharmacy*   Lab Work: - none ordered  If you have labs (blood work) drawn today and your tests are completely normal, you will receive your results only by: [place] (if you have MyChart) OR A paper copy in the mail If you have any lab test that is abnormal or we need to change your treatment, we will call you to review the results.   Testing/Procedures: - none ordered   Follow-Up: At [facility], you and your health needs are our priority.  As part of our continuing mission to provide you with exceptional heart care, we have created designated Provider Care Teams.  These Care Teams include your primary Cardiologist (physician) and Advanced Practice Providers (APPs -  Physician Assistants and Nurse Practitioners) who all work together to provide you with the care you need, when you need it.  We recommend signing up for the patient portal called "MyChart".  Sign up information is provided on this After Visit Summary.  MyChart is used to connect with patients for Virtual Visits (Telemedicine).  Patients are able to view lab/test results, encounter notes, upcoming appointments, etc.  Non-urgent messages can be sent to your provider as well.   To learn more about what you can do with MyChart, go to [email or web address].    Your next appointment:   6 month(s)  The format for your next appointment:   In Person  Provider:   Any of our Primary Cardiologist (needs to establish)     Other Instructions N/a  Important Information About Sugar

## 2022-01-15 NOTE — Telephone Encounter (Signed)
Attempted to contact the patient but vm was full.

## 2022-01-18 ENCOUNTER — Other Ambulatory Visit: Payer: Self-pay | Admitting: Family

## 2022-01-22 ENCOUNTER — Ambulatory Visit (INDEPENDENT_AMBULATORY_CARE_PROVIDER_SITE_OTHER): Payer: 59 | Admitting: Podiatrist

## 2022-01-22 ENCOUNTER — Encounter: Payer: Self-pay | Admitting: Podiatrist

## 2022-01-22 DIAGNOSIS — L97512 Non-pressure chronic ulcer of other part of right foot with fat layer exposed: Secondary | ICD-10-CM

## 2022-01-22 DIAGNOSIS — E08621 Diabetes mellitus due to underlying condition with foot ulcer: Secondary | ICD-10-CM | POA: Diagnosis not present

## 2022-01-22 NOTE — Progress Notes (Unsigned)
HPI: Patient is 61 y.o. male who presents today for concern over a possible ulcer submetatarsal 1 right foot. He relates the area is hard and it had some drainage noted.  He denies any pain today.  He has had an ulcer in this area in the past that went on to heal. The plan was to get some diabetic shoes and he was waiting to do this.  Denies any constitutional signs of infection.   Patient Active Problem List   Diagnosis Date Noted   Prostate cancer screening 01/24/2022   Right knee pain 05/27/2021   Dyspnea 03/04/2021   Pedal edema 02/03/2021   Dilated cardiomyopathy ([place]) 12/19/2020   CHF exacerbation ([place]) 12/16/2020   Acute on chronic diastolic CHF (congestive heart failure) ([place]) 12/16/2020   Hypokalemia 12/16/2020   CHF (congestive heart failure) ([place]) 04/28/2019   Hypertension secondary to other renal disorders 11/29/2016   GERD (gastroesophageal reflux disease) 07/23/2016   Degenerative disc disease at L5-S1 level 04/09/2016   Primary osteoarthritis of right hip 04/09/2016   Obesity, Class I, BMI 30-34.9 02/06/2016   Ulcer of left foot ([place]) 01/06/2015   Microalbuminuria due to type 2 diabetes mellitus ([place]) 10/03/2014   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 diabetes mellitus ([place]) 06/26/2014   NICM (nonischemic cardiomyopathy) ([place]) 09/06/2013   ED (erectile dysfunction) 03/27/2013   Disorder of kidney and ureter 12/17/2012   DM (diabetes mellitus), type 2 with renal complications ([place]) 85/63/1497   Coronary Artery Disease (non-obstructive by cath 05/2011) 06/14/2011   Hyperlipidemia associated with type 2 diabetes mellitus ([place]) 06/14/2011   OSA (obstructive sleep apnea) 05/25/2011   Hypertension     Current Outpatient Medications on File Prior to Visit  Medication Sig Dispense Refill   ACCU-CHEK FASTCLIX LANCETS MISC USE TO MONITOR BLOOD GLUCOSE 3 TIME(S) DAILY (Patient not taking: Reported on 01/14/2022)  5   allopurinol (ZYLOPRIM) 100 MG tablet Take 100 mg by mouth daily.      aspirin EC 81 MG EC tablet Take 1 tablet (81 mg total) by mouth daily.     atorvastatin (LIPITOR) 80 MG tablet Take 1 tablet (80 mg total) by mouth daily. 90 tablet 3   BD PEN NEEDLE NANO U/F 32G X 4 MM MISC Inject 1 Units into the skin as needed. (Patient not taking: Reported on 01/14/2022) 100 each 3   Blood Glucose Monitoring Suppl (ONETOUCH VERIO REFLECT) w/Device KIT 1 Units by Does not apply route as needed. (Patient not taking: Reported on 01/14/2022) 1 kit 0   carvedilol (COREG) 12.5 MG tablet Take 12.5 mg by mouth 2 (two) times daily.     Cholecalciferol (VITAMIN D3) 50 MCG (2000 UT) TABS Take 1 tablet by mouth daily. (Patient not taking: Reported on 01/14/2022)     doxycycline (VIBRA-TABS) 100 MG tablet Take 1 tablet (100 mg total) by mouth 2 (two) times daily. 20 tablet 0   ergocalciferol (VITAMIN D2) 1.25 MG (50000 UT) capsule Take 50,000 Units by mouth once a week. (Patient not taking: Reported on 07/08/2021)     glucose blood (ONETOUCH VERIO) test strip Use to check blood sugar 2 times a day (Patient not taking: Reported on 01/14/2022) 100 each 5   HUMALOG KWIKPEN 100 UNIT/ML KwikPen      hydrALAZINE (APRESOLINE) 100 MG tablet Take 1 tablet (100 mg total) by mouth 3 (three) times daily. 270 tablet 3   isosorbide mononitrate (IMDUR) 60 MG 24 hr tablet TAKE 1 TABLET BY MOUTH TWICE A DAY  180 tablet 1   JARDIANCE 25 MG TABS tablet Take 25 mg by mouth daily.     LANTUS SOLOSTAR 100 UNIT/ML Solostar Pen Inject 50 Units into the skin at bedtime. 15 mL 5   magnesium oxide (MAG-OX) 400 (240 Mg) MG tablet TAKE 1 TABLET BY MOUTH EVERY DAY 90 tablet 1   omeprazole (PRILOSEC) 20 MG capsule Take 1 capsule (20 mg total) by mouth daily. 90 capsule 3   OZEMPIC, 1 MG/DOSE, 4 MG/3ML SOPN INJECT 1 MG UNDER SKIN ONCE A WEEK (Patient not taking: Reported on 01/14/2022) 3 mL 5   predniSONE (DELTASONE) 20 MG tablet 2 tabs po for 4 days, then 1 tab po for 4 days (Patient not taking: Reported on 01/14/2022) 12  tablet 0   spironolactone (ALDACTONE) 25 MG tablet Take 25 mg by mouth daily.     tadalafil (CIALIS) 20 MG tablet TAKE ONE TABLET BY MOUTH DAILY AS NEEDED FOR ERECTILE DYSFUNCTION 30 tablet 5   torsemide (DEMADEX) 20 MG tablet Take 1 tablet (20 mg total) by mouth daily. 90 tablet 1   [DISCONTINUED] potassium chloride (KLOR-CON) 20 MEQ packet Take 20 mEq by mouth 2 (two) times daily.     No current facility-administered medications on file prior to visit.    Allergies  Allergen Reactions   Metformin And Related Other (See Comments)    Due to CHF and CRI    Review of Systems No fevers, chills, nausea, muscle aches, no difficulty breathing, no calf pain, no chest pain or shortness of breath.   Physical Exam  GENERAL APPEARANCE: Alert, conversant. Appropriately groomed. No acute distress.   VASCULAR: Pedal pulses palpable 2/4 DP and  PT bilateral.  Capillary refill time is immediate to all digits,  Proximal to distal cooling is warm to warm.  Digital perfusion adequate.   NEUROLOGIC: sensation is decreased to 5.07 monofilament at 0/5 sites bilateral.  Light touch is intact bilateral, vibratory sensation intact bilateral  MUSCULOSKELETAL: acceptable muscle strength, tone and stability bilateral.  Plantarflexed first metatarsal noted right.    DERMATOLOGIC: skin is warm, supple, and dry.  Full thickness ulcer submetatarsal 1 right foot is noted underlying a large callus. Once the skin is debrided the ulcer measures 2 cm x 3 cm x 0.5 cm in depth. No redness, no swelling, no drainage or pus noted.  No pain with debridement.     Assessment    ICD-10-CM   1. Diabetic ulcer of other part of right foot associated with diabetes mellitus due to underlying condition, with fat layer exposed ([place])  A91.916    L97.512     2. Ulcer of foot, right, with fat layer exposed ([place])  L97.512         Plan  Exam findings and treatment recommendations are discussed.  Pared the hyperkeratotic tissue  and underlying ulcer away to intact, healthy integument.  Recommended use of iodosorb and rx was written and faxed in to [facility] for the iodosorb and dressing supplies. I cleansed the area with wound wash and applied iodosorb and a dressing today.  Gave instructions for home wound care and the use of the surgical shoe he has at home already.  He will be seen back in 2 weeks for a recheck and if any questions, concerns or sign of infection arise, he is to call our office or go to the ED.

## 2022-01-24 ENCOUNTER — Telehealth: Payer: Self-pay | Admitting: Family Medicine

## 2022-01-24 DIAGNOSIS — I10 Essential (primary) hypertension: Secondary | ICD-10-CM

## 2022-01-24 DIAGNOSIS — Z125 Encounter for screening for malignant neoplasm of prostate: Secondary | ICD-10-CM

## 2022-01-24 DIAGNOSIS — E1169 Type 2 diabetes mellitus with other specified complication: Secondary | ICD-10-CM

## 2022-01-24 NOTE — Telephone Encounter (Signed)
-----   Message from Velna Hatchet, RT sent at 01/11/2022  9:42 AM EST ----- Regarding: Mon 12/11 lab Patient is scheduled for cpx, please order future labs.  Thanks, Anda Kraft

## 2022-01-25 ENCOUNTER — Other Ambulatory Visit: Payer: 59

## 2022-02-01 ENCOUNTER — Encounter: Payer: 59 | Admitting: Family Medicine

## 2022-02-01 NOTE — Progress Notes (Deleted)
   Subjective:    Patient ID: Roberto Allen, male    DOB: May 05, 1960, 61 y.o.   MRN: [id number]  HPI Here for health maintenance exam and to review chronic medical problems    Immunization History  Administered Date(s) Administered   Influenza Inj Mdck Quad Pf 01/12/2016, 11/29/2016   Influenza Split 12/18/2012   Influenza, Seasonal, Injecte, Preservative Fre 11/15/2014   Influenza,inj,Quad PF,6+ Mos 12/19/2017, 11/23/2019, 11/28/2020   Influenza,inj,quad, With Preservative 01/12/2016, 11/29/2016   PFIZER(Purple Top)SARS-COV-2 Vaccination 06/14/2019, 07/05/2019   Pneumococcal Polysaccharide-23 03/12/2009, 08/16/2014   Tdap 02/15/2009, 11/23/2019   Health Maintenance Due  Topic Date Due   Diabetic kidney evaluation - Urine ACR  Never done   Zoster Vaccines- Shingrix (1 of 2) Never done   OPHTHALMOLOGY EXAM  12/17/2019   FOOT EXAM  03/20/2021   HEMOGLOBIN A1C  05/29/2021   INFLUENZA VACCINE  09/15/2021   COVID-19 Vaccine (3 - 2023-24 season) 10/16/2021   Diabetic kidney evaluation - eGFR measurement  02/17/2022    Shingrix  Flu shot   Eye exam  Colonoscopy 02/2016  Prostate health  No results found for: "PSA1", "PSA"    Review of Systems     Objective:   Physical Exam        Assessment & Plan:

## 2022-02-05 ENCOUNTER — Ambulatory Visit (INDEPENDENT_AMBULATORY_CARE_PROVIDER_SITE_OTHER): Payer: 59

## 2022-02-05 ENCOUNTER — Ambulatory Visit (INDEPENDENT_AMBULATORY_CARE_PROVIDER_SITE_OTHER): Payer: 59 | Admitting: Podiatry

## 2022-02-05 DIAGNOSIS — L97511 Non-pressure chronic ulcer of other part of right foot limited to breakdown of skin: Secondary | ICD-10-CM

## 2022-02-05 DIAGNOSIS — L02612 Cutaneous abscess of left foot: Secondary | ICD-10-CM

## 2022-02-05 DIAGNOSIS — L03032 Cellulitis of left toe: Secondary | ICD-10-CM

## 2022-02-05 DIAGNOSIS — L97522 Non-pressure chronic ulcer of other part of left foot with fat layer exposed: Secondary | ICD-10-CM

## 2022-02-05 DIAGNOSIS — Z8639 Personal history of other endocrine, nutritional and metabolic disease: Secondary | ICD-10-CM | POA: Diagnosis not present

## 2022-02-05 MED ORDER — DOXYCYCLINE HYCLATE 100 MG PO TABS: 100.0000 mg | ORAL_TABLET | Freq: Two times a day (BID) | ORAL | 1 refills | Status: DC

## 2022-02-05 MED ORDER — SILVER SULFADIAZINE 1 % EX CREA: 1.0000 | TOPICAL_CREAM | Freq: Every day | CUTANEOUS | 0 refills | Status: DC

## 2022-02-05 NOTE — Progress Notes (Signed)
Subjective: Chief Complaint  Patient presents with   Foot Ulcer    left great toe ulcer , patient states foot is better since the last visit. Patient denies any swelling , pain or discharge   61 year old male presents the office this morning with the above concerns.  He has been having ongoing wound to the right foot.  He is previously contact the office he was started on doxycycline.  No recent drainage or pus today.  For evaluation and noticed a wound on the left big toe.  This has been swollen for some time.  He denies any fevers or chills.  Sugar 433 this morning.  Patient not able to get his Ozempic.  Objective: AAO x3, NAD DP/PT pulses palpable bilaterally, CRT less than 3 seconds On the right foot submetatarsal 1 is a thick hyperkeratotic lesion measuring about 2.5 x 2.5 cm and upon debridement there is superficial area skin breakdown with bleeding present.  There is no probing to bone, and or tunneling.  No surrounding erythema, ascending cellulitis.  No fluctuance or crepitation but there is no malodor. On the left hallux there is a hyperkeratotic lesion with dried blood plantar aspect.  Upon debridement superficial purulence was noted which was cultured. Granular wound bed 0.8 x 0.2 x 0.2 cm. There is no probing, undermining, tunneling. No pain with calf compression, swelling, warmth, erythema  Assessment: Bilateral foot ulcer, superficial abscess on left  Plan: -All treatment options discussed with the patient including all alternatives, risks, complications.  -On the right foot I sharply debrided the hyperkeratotic lesion without any complications to reveal superficial granular wound.  There was some bleeding noted Lumicain applied and hemostasis achieved.  Betadine was applied followed by dressing. -Medically necessary wound debridement is performed the left hallux.  Not able to measure the wound prior to debridement as it was covered with callus and post wound measurements are  noted above.  I sharply debrided the wound with a #312 with scalpel down to healthy, granular tissue.  Small mount of superficial purulence was noted and a wound culture was obtained.  No significant blood loss.  I debrided nonviable devitalized tissue to promote wound healing.  Patient was applied followed by dressing. -Silvadene dressing needed bilaterally. -Prescribed doxycycline -X-rays bilaterally ordered -Monitor for any clinical signs or symptoms of infection and directed to call the office immediately should any occur or go to the ER. -Patient encouraged to call the office with any questions, concerns, change in symptoms.

## 2022-02-08 LAB — WOUND CULTURE

## 2022-02-18 ENCOUNTER — Ambulatory Visit (INDEPENDENT_AMBULATORY_CARE_PROVIDER_SITE_OTHER): Payer: 59 | Admitting: Podiatry

## 2022-02-18 ENCOUNTER — Other Ambulatory Visit: Payer: Self-pay | Admitting: Family

## 2022-02-18 ENCOUNTER — Encounter: Payer: Self-pay | Admitting: Podiatry

## 2022-02-18 DIAGNOSIS — L84 Corns and callosities: Secondary | ICD-10-CM | POA: Diagnosis not present

## 2022-02-18 DIAGNOSIS — E1142 Type 2 diabetes mellitus with diabetic polyneuropathy: Secondary | ICD-10-CM | POA: Diagnosis not present

## 2022-02-18 DIAGNOSIS — M2042 Other hammer toe(s) (acquired), left foot: Secondary | ICD-10-CM

## 2022-02-18 DIAGNOSIS — M2041 Other hammer toe(s) (acquired), right foot: Secondary | ICD-10-CM

## 2022-02-18 NOTE — Progress Notes (Signed)
  Subjective:  Patient ID: Roberto Allen, male    DOB: Jul 03, 1960,   MRN: [id number]  Chief Complaint  Patient presents with   Foot Ulcer    Foot ulcer patient states ulcer is better since the last visit     62 y.o. male presents for follow-up of bilateral ulcerations on feet. Relates they are doing well and improving since last seeing Dr. Jacqualyn Posey. States he has finished the course of Doxycycline. Denies any current pain. Has been dressing daily with silvadene to the left.  . Denies any other pedal complaints. Denies n/v/f/c.   Past Medical History:  Diagnosis Date   CAD (coronary artery disease)    a. LHC 4/13: mD1 25%, oD2 25%, pOM1 25%, pRCA 25%, dRCA 40%, EF 30%   CHF (congestive heart failure) (HCC)    Chronic kidney disease (CKD), stage III (moderate) (HCC)    Chronic systolic heart failure (HCC)    a. EF 30-35% in 04/2011, improved to 55-60% in 01/2012.   COPD (chronic obstructive pulmonary disease) (HCC)    Diabetes mellitus    GERD (gastroesophageal reflux disease)    History of pneumonia    Hyperkalemia    a. 12/2012 - spironolactone, KCl supp discontinued.   Hyperlipidemia    Hypertension    Hypertriglyceridemia    a. 12/2012 - started on fenofibrate.   NICM (nonischemic cardiomyopathy) ([place])    a. Echo 3/13: mild LVH, EF 30-35%, grade 3 diast dysfxn, mod LAE;  RHC 4/13:  RA 8, RV 58/10, PA 56/18, mean 35, PCWP mean 17, LV 154/27, CO 4.3, CI 2.0. b. EF Improved >>> Echo (01/2012):  Mild LVH, EF 55-60%, no RWMA, Gr 1 DD    Objective:  Physical Exam: Vascular: DP/PT pulses 2/4 bilateral. CFT <3 seconds. Normal hair growth on digits. No edema.  Skin. No lacerations or abrasions bilateral feet. Left plantar hallux hyperkertosis upon debridement wound is healed. Right plantar first metatarsal hyperkeratosis upon debridement also is healed.  Musculoskeletal: MMT 5/5 bilateral lower extremities in DF, PF, Inversion and Eversion. Deceased ROM in DF of ankle joint. Hammered  digits 2-5  with plantar flexed first metatarsal bilateral and hallux hammertoe.  Neurological: Sensation intact to light touch.   Assessment:   1. Pre-ulcerative calluses   2. Type 2 diabetes mellitus with diabetic polyneuropathy, without long-term current use of insulin (HCC)   3. Hammer toes of both feet      Plan:  Patient was evaluated and treated and all questions answered. Ulcer right plantar foot-healed, left hallux -healed  -Hyperkeratotic tissue debrided without incident. . -Dressed with bandaid and DSD for protection  -Will order and have him fitted for DM shoes to prevent future ulceration.  -No abx indicated.  -Discussed glucose control and proper protein-rich diet.  -Discussed if any worsening redness, pain, fever or chills to call or may need to report to the emergency room. Patient expressed understanding.  Disposition: Patient tolerated procedure well. Patient to return in 4 week for follow-up.  No follow-ups on file.   Lorenda Peck, DPM

## 2022-02-19 ENCOUNTER — Ambulatory Visit: Payer: 59 | Admitting: Podiatry

## 2022-03-12 ENCOUNTER — Other Ambulatory Visit: Payer: Self-pay | Admitting: Family

## 2022-03-14 ENCOUNTER — Encounter: Payer: Self-pay | Admitting: Podiatry

## 2022-03-18 ENCOUNTER — Ambulatory Visit (INDEPENDENT_AMBULATORY_CARE_PROVIDER_SITE_OTHER): Payer: 59 | Admitting: Podiatry

## 2022-03-18 ENCOUNTER — Encounter: Payer: Self-pay | Admitting: Podiatry

## 2022-03-18 ENCOUNTER — Inpatient Hospital Stay (HOSPITAL_COMMUNITY)
Admission: EM | Admit: 2022-03-18 | Discharge: 2022-03-22 | DRG: 617 | Disposition: A | Discharge status: Home / Self Care | Payer: 59 | Attending: Internal Medicine | Admitting: Internal Medicine

## 2022-03-18 ENCOUNTER — Emergency Department (HOSPITAL_COMMUNITY): Payer: 59

## 2022-03-18 ENCOUNTER — Telehealth: Payer: Self-pay | Admitting: *Deleted

## 2022-03-18 ENCOUNTER — Encounter (HOSPITAL_COMMUNITY): Payer: Self-pay | Admitting: Emergency Medicine

## 2022-03-18 ENCOUNTER — Ambulatory Visit (INDEPENDENT_AMBULATORY_CARE_PROVIDER_SITE_OTHER): Payer: 59

## 2022-03-18 ENCOUNTER — Other Ambulatory Visit: Payer: Self-pay

## 2022-03-18 ENCOUNTER — Inpatient Hospital Stay (HOSPITAL_COMMUNITY): Payer: 59

## 2022-03-18 DIAGNOSIS — M86072 Acute hematogenous osteomyelitis, left ankle and foot: Secondary | ICD-10-CM | POA: Diagnosis not present

## 2022-03-18 DIAGNOSIS — I130 Hypertensive heart and chronic kidney disease with heart failure and stage 1 through stage 4 chronic kidney disease, or unspecified chronic kidney disease: Secondary | ICD-10-CM | POA: Diagnosis present

## 2022-03-18 DIAGNOSIS — I5042 Chronic combined systolic (congestive) and diastolic (congestive) heart failure: Secondary | ICD-10-CM | POA: Diagnosis present

## 2022-03-18 DIAGNOSIS — E11621 Type 2 diabetes mellitus with foot ulcer: Secondary | ICD-10-CM | POA: Diagnosis present

## 2022-03-18 DIAGNOSIS — R700 Elevated erythrocyte sedimentation rate: Secondary | ICD-10-CM | POA: Diagnosis present

## 2022-03-18 DIAGNOSIS — E1121 Type 2 diabetes mellitus with diabetic nephropathy: Secondary | ICD-10-CM | POA: Diagnosis not present

## 2022-03-18 DIAGNOSIS — I10 Essential (primary) hypertension: Secondary | ICD-10-CM | POA: Diagnosis present

## 2022-03-18 DIAGNOSIS — J449 Chronic obstructive pulmonary disease, unspecified: Secondary | ICD-10-CM | POA: Diagnosis present

## 2022-03-18 DIAGNOSIS — L97529 Non-pressure chronic ulcer of other part of left foot with unspecified severity: Secondary | ICD-10-CM | POA: Diagnosis present

## 2022-03-18 DIAGNOSIS — E871 Hypo-osmolality and hyponatremia: Secondary | ICD-10-CM | POA: Insufficient documentation

## 2022-03-18 DIAGNOSIS — D649 Anemia, unspecified: Secondary | ICD-10-CM | POA: Diagnosis not present

## 2022-03-18 DIAGNOSIS — E875 Hyperkalemia: Secondary | ICD-10-CM | POA: Diagnosis present

## 2022-03-18 DIAGNOSIS — L84 Corns and callosities: Secondary | ICD-10-CM

## 2022-03-18 DIAGNOSIS — E1122 Type 2 diabetes mellitus with diabetic chronic kidney disease: Secondary | ICD-10-CM | POA: Diagnosis present

## 2022-03-18 DIAGNOSIS — E1140 Type 2 diabetes mellitus with diabetic neuropathy, unspecified: Secondary | ICD-10-CM | POA: Diagnosis present

## 2022-03-18 DIAGNOSIS — N179 Acute kidney failure, unspecified: Secondary | ICD-10-CM | POA: Diagnosis present

## 2022-03-18 DIAGNOSIS — Z7982 Long term (current) use of aspirin: Secondary | ICD-10-CM | POA: Diagnosis not present

## 2022-03-18 DIAGNOSIS — K219 Gastro-esophageal reflux disease without esophagitis: Secondary | ICD-10-CM | POA: Diagnosis present

## 2022-03-18 DIAGNOSIS — N184 Chronic kidney disease, stage 4 (severe): Secondary | ICD-10-CM | POA: Diagnosis present

## 2022-03-18 DIAGNOSIS — Z833 Family history of diabetes mellitus: Secondary | ICD-10-CM

## 2022-03-18 DIAGNOSIS — Z803 Family history of malignant neoplasm of breast: Secondary | ICD-10-CM

## 2022-03-18 DIAGNOSIS — I509 Heart failure, unspecified: Secondary | ICD-10-CM

## 2022-03-18 DIAGNOSIS — D631 Anemia in chronic kidney disease: Secondary | ICD-10-CM | POA: Diagnosis present

## 2022-03-18 DIAGNOSIS — M869 Osteomyelitis, unspecified: Secondary | ICD-10-CM | POA: Diagnosis present

## 2022-03-18 DIAGNOSIS — Z794 Long term (current) use of insulin: Secondary | ICD-10-CM | POA: Diagnosis not present

## 2022-03-18 DIAGNOSIS — E781 Pure hyperglyceridemia: Secondary | ICD-10-CM | POA: Diagnosis present

## 2022-03-18 DIAGNOSIS — R7982 Elevated C-reactive protein (CRP): Secondary | ICD-10-CM | POA: Diagnosis present

## 2022-03-18 DIAGNOSIS — Z87891 Personal history of nicotine dependence: Secondary | ICD-10-CM

## 2022-03-18 DIAGNOSIS — L97524 Non-pressure chronic ulcer of other part of left foot with necrosis of bone: Secondary | ICD-10-CM

## 2022-03-18 DIAGNOSIS — E785 Hyperlipidemia, unspecified: Secondary | ICD-10-CM | POA: Diagnosis not present

## 2022-03-18 DIAGNOSIS — M86172 Other acute osteomyelitis, left ankle and foot: Secondary | ICD-10-CM | POA: Diagnosis present

## 2022-03-18 DIAGNOSIS — E1169 Type 2 diabetes mellitus with other specified complication: Secondary | ICD-10-CM | POA: Diagnosis present

## 2022-03-18 DIAGNOSIS — I2510 Atherosclerotic heart disease of native coronary artery without angina pectoris: Secondary | ICD-10-CM | POA: Diagnosis present

## 2022-03-18 DIAGNOSIS — E1165 Type 2 diabetes mellitus with hyperglycemia: Secondary | ICD-10-CM | POA: Diagnosis present

## 2022-03-18 DIAGNOSIS — M009 Pyogenic arthritis, unspecified: Secondary | ICD-10-CM | POA: Diagnosis present

## 2022-03-18 DIAGNOSIS — I428 Other cardiomyopathies: Secondary | ICD-10-CM | POA: Diagnosis present

## 2022-03-18 DIAGNOSIS — E1142 Type 2 diabetes mellitus with diabetic polyneuropathy: Secondary | ICD-10-CM | POA: Diagnosis not present

## 2022-03-18 DIAGNOSIS — Z888 Allergy status to other drugs, medicaments and biological substances status: Secondary | ICD-10-CM

## 2022-03-18 DIAGNOSIS — E1129 Type 2 diabetes mellitus with other diabetic kidney complication: Secondary | ICD-10-CM | POA: Diagnosis present

## 2022-03-18 DIAGNOSIS — Z79899 Other long term (current) drug therapy: Secondary | ICD-10-CM

## 2022-03-18 DIAGNOSIS — Z7984 Long term (current) use of oral hypoglycemic drugs: Secondary | ICD-10-CM | POA: Diagnosis not present

## 2022-03-18 LAB — BASIC METABOLIC PANEL
Anion gap: 12 (ref 5–15)
BUN: 66 mg/dL — ABNORMAL HIGH (ref 8–23)
CO2: 23 mmol/L (ref 22–32)
Calcium: 8.9 mg/dL (ref 8.9–10.3)
Chloride: 99 mmol/L (ref 98–111)
Creatinine, Ser: 4.11 mg/dL — ABNORMAL HIGH (ref 0.61–1.24)
GFR, Estimated: 16 mL/min — ABNORMAL LOW (ref >60)
Glucose, Bld: 236 mg/dL — ABNORMAL HIGH (ref 70–99)
Potassium: 5.2 mmol/L — ABNORMAL HIGH (ref 3.5–5.1)
Sodium: 134 mmol/L — ABNORMAL LOW (ref 135–145)

## 2022-03-18 LAB — CBC WITH DIFFERENTIAL/PLATELET
Abs Immature Granulocytes: 0.02 10*3/uL (ref 0.00–0.07)
Basophils Absolute: 0 10*3/uL (ref 0.0–0.1)
Basophils Relative: 0 %
Eosinophils Absolute: 0.1 10*3/uL (ref 0.0–0.5)
Eosinophils Relative: 1 %
HCT: 33.2 % — ABNORMAL LOW (ref 39.0–52.0)
Hemoglobin: 10.6 g/dL — ABNORMAL LOW (ref 13.0–17.0)
Immature Granulocytes: 0 %
Lymphocytes Relative: 19 %
Lymphs Abs: 1.9 10*3/uL (ref 0.7–4.0)
MCH: 28.8 pg (ref 26.0–34.0)
MCHC: 31.9 g/dL (ref 30.0–36.0)
MCV: 90.2 fL (ref 80.0–100.0)
Monocytes Absolute: 0.5 10*3/uL (ref 0.1–1.0)
Monocytes Relative: 5 %
Neutro Abs: 7.6 10*3/uL (ref 1.7–7.7)
Neutrophils Relative %: 75 %
Platelets: 354 10*3/uL (ref 150–400)
RBC: 3.68 MIL/uL — ABNORMAL LOW (ref 4.22–5.81)
RDW: 15.7 % — ABNORMAL HIGH (ref 11.5–15.5)
WBC: 10.1 10*3/uL (ref 4.0–10.5)
nRBC: 0 % (ref 0.0–0.2)

## 2022-03-18 LAB — MAGNESIUM: Magnesium: 2.6 mg/dL — ABNORMAL HIGH (ref 1.7–2.4)

## 2022-03-18 LAB — SEDIMENTATION RATE: Sed Rate: 95 mm/hr — ABNORMAL HIGH (ref 0–16)

## 2022-03-18 MED ORDER — ACETAMINOPHEN 650 MG RE SUPP: 650.0000 mg | Freq: Four times a day (QID) | RECTAL | Status: DC | PRN

## 2022-03-18 MED ORDER — DOXYCYCLINE HYCLATE 100 MG PO TABS: 100.0000 mg | ORAL_TABLET | Freq: Two times a day (BID) | ORAL | 0 refills | Status: DC

## 2022-03-18 MED ORDER — SODIUM CHLORIDE 0.9 % IV SOLN: INTRAVENOUS | Status: AC

## 2022-03-18 MED ORDER — ACETAMINOPHEN 325 MG PO TABS
650.0000 mg | ORAL_TABLET | Freq: Four times a day (QID) | ORAL | Status: DC | PRN
  Administered 2022-03-20 – 2022-03-22 (×4): 650 mg via ORAL
  Filled 2022-03-18 (×4): qty 2

## 2022-03-18 MED ORDER — INSULIN ASPART 100 UNIT/ML IJ SOLN
0.0000 [IU] | INTRAMUSCULAR | Status: DC
  Administered 2022-03-19: 2 [IU] via SUBCUTANEOUS
  Administered 2022-03-20 – 2022-03-21 (×6): 1 [IU] via SUBCUTANEOUS
  Administered 2022-03-21 – 2022-03-22 (×2): 2 [IU] via SUBCUTANEOUS
  Filled 2022-03-18: qty 0.09

## 2022-03-18 MED ORDER — PIPERACILLIN-TAZOBACTAM 3.375 G IVPB 30 MIN
3.3750 g | INTRAVENOUS | Status: AC
  Administered 2022-03-18: 3.375 g via INTRAVENOUS
  Filled 2022-03-18: qty 50

## 2022-03-18 MED ORDER — VANCOMYCIN HCL 2000 MG/400ML IV SOLN
2000.0000 mg | INTRAVENOUS | Status: AC
  Administered 2022-03-18: 2000 mg via INTRAVENOUS
  Filled 2022-03-18: qty 400

## 2022-03-18 NOTE — Progress Notes (Signed)
Subjective:  Patient ID: Roberto Allen, male    DOB: [birth year],   MRN: [id number]  Chief Complaint  Patient presents with   Follow-up    Pre-ulcerative calluses to left hallux. Patient stated he has noticed drainage from the toe and is changing dressing daily using Silvadene cream. Patient is diabetic and checks blood sugar twice a day. Blood sugar this morning was 49.     62 y.o. male presents for follow-up of bilateral ulcerations on feet. Relates recently the callus on the left started to drain. He has been dressing with silvadene and DSD. 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 . Denies any current pain. Has been dressing daily with silvadene to the left.  . Denies any other pedal complaints. Denies n/v/f/c.   Past Medical History:  Diagnosis Date   CAD (coronary artery disease)    a. LHC 4/13: mD1 25%, oD2 25%, pOM1 25%, pRCA 25%, dRCA 40%, EF 30%   CHF (congestive heart failure) (HCC)    Chronic kidney disease (CKD), stage III (moderate) (HCC)    Chronic systolic heart failure (HCC)    a. EF 30-35% in 04/2011, improved to 55-60% in 01/2012.   COPD (chronic obstructive pulmonary disease) (HCC)    Diabetes mellitus    GERD (gastroesophageal reflux disease)    History of pneumonia    Hyperkalemia    a. 12/2012 - spironolactone, KCl supp discontinued.   Hyperlipidemia    Hypertension    Hypertriglyceridemia    a. 12/2012 - started on fenofibrate.   NICM (nonischemic cardiomyopathy) ([place])    a. Echo 3/13: mild LVH, EF 30-35%, grade 3 diast dysfxn, mod LAE;  RHC 4/13:  RA 8, RV 58/10, PA 56/18, mean 35, PCWP mean 17, LV 154/27, CO 4.3, CI 2.0. b. EF Improved >>> Echo (01/2012):  Mild LVH, EF 55-60%, no RWMA, Gr 1 DD    Objective:  Physical Exam: Vascular: DP/PT pulses 2/4 bilateral. CFT <3 seconds. Normal hair growth on digits. No edema.  Skin. No lacerations or abrasions bilateral feet. Left plantar hallux hyperkertosis upon  debridement ulceration noted with necrotic tissue and purulence noted. Does probe to bone. Malodor noted. Erythema and edema surrounding the toe. . Right plantar first metatarsal hyperkeratosis upon debridement also is healed.  Musculoskeletal: MMT 5/5 bilateral lower extremities in DF, PF, Inversion and Eversion. Deceased ROM in DF of ankle joint. Hammered digits 2-5  with plantar flexed first metatarsal bilateral and hallux hammertoe.  Neurological: Sensation intact to light touch.   Assessment:   1. 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 Pre-ulcerative calluses   3. Type 2 diabetes mellitus with diabetic polyneuropathy, without long-term current use of insulin ([place])      Plan:  Patient was evaluated and treated and all questions answered. Ulcer Left hallux with necrosis of bone  -X-rays reviewed. Possible erosion noted to plantar distal tip of distal phalanx of left hallux.  -Debridement as below. -Dressed with betadine, DSD. -Off-loading with surgical shoe. -Doxycycline sent to pharmacy.   -Discussed glucose control and proper protein-rich diet.  -Discussed with patient concern for deep infection and need for possible amputation of the toe. Discussed heading to the ER due to severity of infection and need for IV antibiotics and MRI and surgical intervention. Patient expressed understanding. He has some personal matters to work out but will head there later tonight or tomorrow morning.   Procedure: Excisional  Debridement of Wound Rationale: Removal of non-viable soft tissue from the wound to promote healing.  Anesthesia: none Pre-Debridement Wound Measurements: Overlying callus  Post-Debridement Wound Measurements: 1 cm  cm x 2 cm x 2 cm  Type of Debridement: Sharp Excisional Tissue Removed: Non-viable soft tissue Depth of Debridement: subcutaneous tissue. Technique: Sharp excisional debridement to bleeding, viable wound base.  Dressing: Dry, sterile, compression  dressing. Disposition: Patient tolerated procedure well. Patient to head to emergency room for IV antibiotics, MRI and surgical intervention.   No follow-ups on file.   No follow-ups on file.   Lorenda Peck, DPM

## 2022-03-18 NOTE — ED Notes (Signed)
ED TO INPATIENT HANDOFF REPORT  ED Nurse Name and Phone #: Cinda Quest, RN [phone]  S Name/Age/Gender Roberto Allen 62 y.o. male Room/Bed: WA23/WA23  Code Status   Code Status: Full Code  Home/SNF/Other Home Patient oriented to: self, place, time, and situation Is this baseline? Yes   Triage Complete: Triage complete  Chief Complaint Osteomyelitis [facility]) [M86.9]  Triage Note Patient report left toe infection. Pt report Dr office recommended patient to be seen for possible osteomyelitis on his left foot. Pt denies N/V. Pt a/ox4.   Allergies Allergies  Allergen Reactions   Metformin And Related Other (See Comments)    Due to CHF and CRI    Level of Care/Admitting Diagnosis ED Disposition     ED Disposition  Admit   Condition  --   Comment  Hospital Area: [place] [[id number]]  Level of Care: Telemetry [5]  Admit to tele based on following criteria: Other see comments  Comments: Hyperkalemia  May [facility] patient to [facility] or [facility] if equivalent level of care is available:: Yes  Covid Evaluation: Asymptomatic - no recent exposure (last 10 days) testing not required  Diagnosis: Osteomyelitis [facility]) [[id number]]  Admitting Physician: Shela Leff [[id number]]  Attending Physician: Shela Leff [[id number]]  Certification:: I certify this patient will need inpatient services for at least 2 midnights  Estimated Length of Stay: 2          B Medical/Surgery History Past Medical History:  Diagnosis Date   CAD (coronary artery disease)    a. LHC 4/13: mD1 25%, oD2 25%, pOM1 25%, pRCA 25%, dRCA 40%, EF 30%   CHF (congestive heart failure) (HCC)    Chronic kidney disease (CKD), stage III (moderate) (HCC)    Chronic systolic heart failure (HCC)    a. EF 30-35% in 04/2011, improved to 55-60% in 01/2012.   COPD (chronic obstructive pulmonary disease) (HCC)    Diabetes mellitus    GERD (gastroesophageal reflux disease)    History of  pneumonia    Hyperkalemia    a. 12/2012 - spironolactone, KCl supp discontinued.   Hyperlipidemia    Hypertension    Hypertriglyceridemia    a. 12/2012 - started on fenofibrate.   NICM (nonischemic cardiomyopathy) ([place])    a. Echo 3/13: mild LVH, EF 30-35%, grade 3 diast dysfxn, mod LAE;  RHC 4/13:  RA 8, RV 58/10, PA 56/18, mean 35, PCWP mean 17, LV 154/27, CO 4.3, CI 2.0. b. EF Improved >>> Echo (01/2012):  Mild LVH, EF 55-60%, no RWMA, Gr 1 DD   Past Surgical History:  Procedure Laterality Date   CARDIAC CATHETERIZATION  2013   non-obs dz 25-40% multiple vessels   TENOTOMY  2018     A IV Location/Drains/Wounds Patient Lines/Drains/Airways Status     Active Line/Drains/Airways     Name Placement date Placement time Site Days   Peripheral IV 03/18/22 18 G 1" Left;Posterior Hand 03/18/22  2050  Hand  less than 1            Intake/Output Last 24 hours  Intake/Output Summary (Last 24 hours) at 03/18/2022 2321 Last data filed at 03/18/2022 2150 Gross per 24 hour  Intake 50 ml  Output --  Net 50 ml    Labs/Imaging Results for orders placed or performed during the hospital encounter of 03/18/22 (from the past 48 hour(s))  CBC with Differential     Status: Abnormal   Collection Time: 03/18/22  6:26 PM  Result Value Ref Range  WBC 10.1 4.0 - 10.5 K/uL   RBC 3.68 (L) 4.22 - 5.81 MIL/uL   Hemoglobin 10.6 (L) 13.0 - 17.0 g/dL   HCT 33.2 (L) 39.0 - 52.0 %   MCV 90.2 80.0 - 100.0 fL   MCH 28.8 26.0 - 34.0 pg   MCHC 31.9 30.0 - 36.0 g/dL   RDW 15.7 (H) 11.5 - 15.5 %   Platelets 354 150 - 400 K/uL   nRBC 0.0 0.0 - 0.2 %   Neutrophils Relative % 75 %   Neutro Abs 7.6 1.7 - 7.7 K/uL   Lymphocytes Relative 19 %   Lymphs Abs 1.9 0.7 - 4.0 K/uL   Monocytes Relative 5 %   Monocytes Absolute 0.5 0.1 - 1.0 K/uL   Eosinophils Relative 1 %   Eosinophils Absolute 0.1 0.0 - 0.5 K/uL   Basophils Relative 0 %   Basophils Absolute 0.0 0.0 - 0.1 K/uL   Immature Granulocytes 0 %    Abs Immature Granulocytes 0.02 0.00 - 0.07 K/uL    Comment: Performed at [facility], [place] [address] [place], [place] [address]  Basic metabolic panel     Status: Abnormal   Collection Time: 03/18/22  6:26 PM  Result Value Ref Range   Sodium 134 (L) 135 - 145 mmol/L   Potassium 5.2 (H) 3.5 - 5.1 mmol/L   Chloride 99 98 - 111 mmol/L   CO2 23 22 - 32 mmol/L   Glucose, Bld 236 (H) 70 - 99 mg/dL    Comment: Glucose reference range applies only to samples taken after fasting for at least 8 hours.   BUN 66 (H) 8 - 23 mg/dL   Creatinine, Ser 4.11 (H) 0.61 - 1.24 mg/dL   Calcium 8.9 8.9 - 10.3 mg/dL   GFR, Estimated 16 (L) >60 mL/min    Comment: (NOTE) Calculated using the CKD-EPI Creatinine Equation (2021)    Anion gap 12 5 - 15    Comment: Performed at [facility], [place] [address] [place], [place] [address]  Magnesium     Status: Abnormal   Collection Time: 03/18/22  9:56 PM  Result Value Ref Range   Magnesium 2.6 (H) 1.7 - 2.4 mg/dL    Comment: Performed at [facility], [place] [address] [place], [place] [address]   MR FOOT LEFT WO CONTRAST  Result Date: 03/18/2022 CLINICAL DATA:  Left great toe wound infection, concern for osteomyelitis EXAM: MRI OF THE LEFT FOOT WITHOUT CONTRAST TECHNIQUE: Multiplanar, multisequence MR imaging of the left forefoot was performed. No intravenous contrast was administered. COMPARISON:  Radiograph performed earlier on the same date FINDINGS: Bones/Joint/Cartilage There is bone marrow edema of the distal phalanx of the first digit. There is also small joint effusion in the interphalangeal joint of the first digit. Marrow signal within remaining osseous structures is within normal limits Ligaments Lisfranc and collateral ligaments appear intact. 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 Soft tissues Marked  subcutaneous soft tissue edema about the foot prominent about the dorsum of the foot. Deep skin wound about the dorsal aspect of the first distal phalanx with surrounding edema and inflammatory changes. No fluid collection or abscess IMPRESSION: 1. Bone marrow edema of the distal phalanx of the first digit concerning for osteomyelitis. 2. Small joint effusion of the interphalangeal joint of the first digit concerning for septic arthritis. 3. Soft tissue wound about the dorsal aspect of the  first distal phalanx with surrounding edema and inflammatory changes. No fluid collection or abscess. 4. 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 Electronically Signed   By: Keane Police D.O.   On: 03/18/2022 22:48   DG Foot Complete Left  Result Date: 03/18/2022 CLINICAL DATA:  Ulcer EXAM: LEFT FOOT - COMPLETE 3 VIEW COMPARISON:  None Available. FINDINGS: Soft tissue swelling noted of the great toe. There is cortical irregularity with possible erosion of the distal cortex distal phalanx which is concerning for osteomyelitis. No traumatic osseous abnormalities. There are plantar and posterior calcaneal spurs. No osteolytic or osteoblastic changes. IMPRESSION: Possible osteomyelitis involving the great toe distal phalanx. Soft tissue swelling. Calcaneal spurs. Electronically Signed   By: Sammie Bench M.D.   On: 03/18/2022 09:41    Pending Labs Unresulted Labs (From admission, onward)     Start     Ordered   03/19/22 2841  Basic metabolic panel  Tomorrow morning,   R        03/18/22 2314   03/19/22 0500  CBC  Tomorrow morning,   R        03/18/22 2314   03/19/22 0500  Brain natriuretic peptide  Tomorrow morning,   R        03/18/22 2314   03/18/22 2314  Hemoglobin A1c  Once,   R       Comments: To assess prior glycemic control    03/18/22 2314   03/18/22 2313  Sodium, urine, random  Once,   R        03/18/22 2314   03/18/22 2313  Creatinine, urine, random  Once,   R         03/18/22 2314   03/18/22 2310  HIV Antibody (routine testing w rflx)  (HIV Antibody (Routine testing w reflex) panel)  Once,   R        03/18/22 2314   03/18/22 2013  Sedimentation rate  Once,   URGENT        03/18/22 2015   03/18/22 2013  C-reactive protein  Once,   URGENT        03/18/22 2015   03/18/22 2013  Blood culture (routine x 2)  BLOOD CULTURE X 2,   R (with STAT occurrences)      03/18/22 2015            Vitals/Pain Today's Vitals   03/18/22 1804 03/18/22 1807 03/18/22 2107 03/18/22 2309  BP: (!) 106/54  (!) 142/89 (!) 144/78  Pulse: 87  71 73  Resp: 16  18 18   Temp: 97.8 F (36.6 C)  98.3 F (36.8 C) 98 F (36.7 C)  TempSrc: Oral  Oral Oral  SpO2: 98%  96% 95%  Weight:  98.2 kg    Height:  5\' 11"  (1.803 m)    PainSc:  0-No pain 0-No pain     Isolation Precautions No active isolations  Medications Medications  vancomycin (VANCOREADY) IVPB 2000 mg/400 mL (2,000 mg Intravenous New Bag/Given 03/18/22 2307)  acetaminophen (TYLENOL) tablet 650 mg (has no administration in time range)    Or  acetaminophen (TYLENOL) suppository 650 mg (has no administration in time range)  0.9 %  sodium chloride infusion (has no administration in time range)  insulin aspart (novoLOG) injection 0-9 Units (has no administration in time range)  piperacillin-tazobactam (ZOSYN) IVPB 3.375 g (0 g Intravenous Stopped 03/18/22 2150)    Mobility walks     Focused Assessments  R Recommendations: See Admitting Provider Note  Report given to:   Additional Notes: none

## 2022-03-18 NOTE — Telephone Encounter (Signed)
   Pre-operative Risk Assessment    Patient Name: Roberto Allen  DOB: [birth year] MRN: [id number]      Request for Surgical Clearance    Procedure:   Colonoscopy  Date of Surgery:  Clearance TBD                                 Surgeon:  Dr. Gerri Spore Surgeon's Group or Practice Name:  [facility]  Phone number:  [phone] Fax number:  [phone]   Type of Clearance Requested:   - Medical    Type of Anesthesia:  MAC   Additional requests/questions:    SignedSilverio Lay   03/18/2022, 3:21 PM

## 2022-03-18 NOTE — ED Provider Triage Note (Signed)
Emergency Medicine Provider Triage Evaluation Note  Channing Yeager , a 62 y.o. male  was evaluated in triage.  Pt complains of being referred from his podiatrist for further evaluation of osteomyelitis.  He was seen earlier today at the podiatry clinic and recommended to come into the ED for IV antibiotics, MRI, and admission..  Review of Systems  Positive: As above Negative: As above  Physical Exam  BP (!) 106/54 (BP Location: Left Arm)   Pulse 87   Temp 97.8 F (36.6 C) (Oral)   Resp 16   Ht 5\' 11"  (1.803 m)   Wt 98.2 kg   SpO2 98%   BMI 30.18 kg/m  Gen:   Awake, no distress   Resp:  Normal effort  MSK:   Moves extremities without difficulty  Other:   Medical Decision Making  Medically screening exam initiated at 6:17 PM.  Appropriate orders placed.  Bayan Kushnir was informed that the remainder of the evaluation will be completed by another provider, this initial triage assessment does not replace that evaluation, and the importance of remaining in the ED until their evaluation is complete.     Evlyn Courier, PA-C 03/18/22 1818

## 2022-03-18 NOTE — Progress Notes (Signed)
A consult was received from an ED physician for Roberto Allen per pharmacy dosing.  The patient's profile has been reviewed for ht/wt/allergies/indication/available labs.   A one time order has been placed for Vanco 2g IV x 1 and Zosyn 3.375g IV x 1.  Further antibiotics/pharmacy consults should be ordered by admitting physician if indicated.                        Roberto Allen S. Alford Highland, PharmD, BCPS Clinical Staff Pharmacist [email or web address] Thank you, Roberto Allen 03/18/2022  8:35 PM

## 2022-03-18 NOTE — H&P (Signed)
History and Physical    Roberto Allen ZYS:[id number] DOB: 1961-01-13 DOA: 03/18/2022  PCP: [name], MD  Patient coming from: Home  Chief Complaint: Left great toe infection  HPI: Joeseph Allen is a 62 y.o. male with medical history significant of CAD, chronic HFmrEF, CKD stage IV, COPD, insulin-dependent type 2 diabetes, GERD, hypertension, hyperlipidemia.  He was seen by podiatry today for follow-up of left great toe ulcer and underwent excisional debridement of the wound in the office.  X-ray was concerning for possible osteomyelitis involving the left great toe distal phalanx.  Sent to the ED for IV antibiotics, MRI, and surgical intervention.  In the ED, patient afebrile and vital signs stable.  Labs showing no leukocytosis, hemoglobin 10.6 (previously >12 on labs done in November 2022), MCV 90.2, sodium 134, potassium 5.2, glucose 236, bicarb 23, BUN 66, creatinine 4.1 (was 2.9 on labs done a year ago), ESR and CRP pending, blood cultures drawn.  MRI of left foot pending. Patient was given vancomycin and Zosyn.  ED PA spoke to Dr. Posey Pronto with podiatry, their team will consult in the morning.  [facility] called to admit.  History provided by the patient and his wife at bedside.  He is reporting infection of the great toe of the left foot with increasing foul-smelling drainage for the past 2 weeks.  He was prescribed a 7-day course of doxycycline by his podiatrist which he has almost finished taking but the infection has not improved.  Denies any pain in his foot.  Denies fevers.  Patient states he has chronic kidney disease and is seen by a nephrologist.  He was previously on spironolactone but has not taken it for several months.  States he was advised by his nephrologist to take diuretics for volume overload and he has been taking both furosemide and torsemide which he feels could be affecting his kidney function.  Wife states he was seen by his PCP a few days ago and told that his kidney  disease has progressed to stage V. He denies vomiting, diarrhea, or abdominal pain.  Review of Systems:  Review of Systems  All other systems reviewed and are negative.   Past Medical History:  Diagnosis Date   CAD (coronary artery disease)    a. LHC 4/13: mD1 25%, oD2 25%, pOM1 25%, pRCA 25%, dRCA 40%, EF 30%   CHF (congestive heart failure) (HCC)    Chronic kidney disease (CKD), stage III (moderate) (HCC)    Chronic systolic heart failure (HCC)    a. EF 30-35% in 04/2011, improved to 55-60% in 01/2012.   COPD (chronic obstructive pulmonary disease) (HCC)    Diabetes mellitus    GERD (gastroesophageal reflux disease)    History of pneumonia    Hyperkalemia    a. 12/2012 - spironolactone, KCl supp discontinued.   Hyperlipidemia    Hypertension    Hypertriglyceridemia    a. 12/2012 - started on fenofibrate.   NICM (nonischemic cardiomyopathy) ([place])    a. Echo 3/13: mild LVH, EF 30-35%, grade 3 diast dysfxn, mod LAE;  RHC 4/13:  RA 8, RV 58/10, PA 56/18, mean 35, PCWP mean 17, LV 154/27, CO 4.3, CI 2.0. b. EF Improved >>> Echo (01/2012):  Mild LVH, EF 55-60%, no RWMA, Gr 1 DD    Past Surgical History:  Procedure Laterality Date   CARDIAC CATHETERIZATION  2013   non-obs dz 25-40% multiple vessels   TENOTOMY  2018     reports that he quit smoking about 15  years ago. His smoking use included cigarettes. He has a 20.00 pack-year smoking history. He has never used smokeless tobacco. He reports that he does not currently use alcohol. He reports that he does not use drugs.  Allergies  Allergen Reactions   Metformin And Related Other (See Comments)    Due to CHF and CRI    Family History  Problem Relation Age of Onset   Diabetes Sister    Breast cancer Mother    Breast cancer Maternal Grandmother     Prior to Admission medications   Medication Sig Start Date End Date Taking? Authorizing Provider  ACCU-CHEK FASTCLIX LANCETS MISC USE TO MONITOR BLOOD GLUCOSE 3 TIME(S)  DAILY Patient not taking: Reported on 01/14/2022 05/05/17   [provider]  allopurinol (ZYLOPRIM) 100 MG tablet Take 100 mg by mouth daily. 11/24/21   [provider]  aspirin EC 81 MG EC tablet Take 1 tablet (81 mg total) by mouth daily. 05/02/19   Geradine Girt, DO  atorvastatin (LIPITOR) 80 MG tablet Take 1 tablet (80 mg total) by mouth daily. 07/20/21   Tower, Wynelle Fanny, MD  BD PEN NEEDLE NANO U/F 32G X 4 MM MISC Inject 1 Units into the skin as needed. Patient not taking: Reported on 01/14/2022 11/05/19   Elby Beck, FNP  Blood Glucose Monitoring Suppl (ONETOUCH VERIO REFLECT) w/Device KIT 1 Units by Does not apply route as needed. Patient not taking: Reported on 01/14/2022 11/28/20   Tower, Wynelle Fanny, MD  carvedilol (COREG) 12.5 MG tablet Take 12.5 mg by mouth 2 (two) times daily. 02/02/21   [provider]  Cholecalciferol (VITAMIN D3) 50 MCG (2000 UT) TABS Take 1 tablet by mouth daily. Patient not taking: Reported on 01/14/2022 07/25/19   [provider]  doxycycline (VIBRA-TABS) 100 MG tablet Take 1 tablet (100 mg total) by mouth 2 (two) times daily for 10 days. 03/18/22 03/28/22  Lorenda Peck, DPM  ergocalciferol (VITAMIN D2) 1.25 MG (50000 UT) capsule Take 50,000 Units by mouth once a week. Patient not taking: Reported on 07/08/2021    [provider]  glucose blood (ONETOUCH VERIO) test strip Use to check blood sugar 2 times a day Patient not taking: Reported on 01/14/2022 12/24/20   Abner Greenspan, MD  HUMALOG KWIKPEN 100 UNIT/ML KwikPen  06/19/19   [provider]  hydrALAZINE (APRESOLINE) 100 MG tablet Take 1 tablet (100 mg total) by mouth 3 (three) times daily. 01/29/21 01/14/22  Alisa Graff, FNP  isosorbide mononitrate (IMDUR) 60 MG 24 hr tablet TAKE 1 TABLET BY MOUTH TWICE A DAY 12/29/21   Tower, Wynelle Fanny, MD  JARDIANCE 25 MG TABS tablet Take 25 mg by mouth daily. 12/03/21   [provider]  LANTUS SOLOSTAR 100  UNIT/ML Solostar Pen Inject 50 Units into the skin at bedtime. 06/16/20   Tower, Wynelle Fanny, MD  magnesium oxide (MAG-OX) 400 (240 Mg) MG tablet TAKE 1 TABLET BY MOUTH EVERY DAY 02/04/21   Tower, Wynelle Fanny, MD  omeprazole (PRILOSEC) 20 MG capsule Take 1 capsule (20 mg total) by mouth daily. 07/20/21   Tower, Wynelle Fanny, MD  OZEMPIC, 1 MG/DOSE, 4 MG/3ML SOPN INJECT 1 MG UNDER SKIN ONCE A WEEK Patient not taking: Reported on 01/14/2022 02/05/21   Tower, Wynelle Fanny, MD  predniSONE (DELTASONE) 20 MG tablet 2 tabs po for 4 days, then 1 tab po for 4 days Patient not taking: Reported on 01/14/2022 07/08/21   Owens Loffler, MD  silver  sulfADIAZINE (SILVADENE) 1 % cream Apply 1 Application topically daily. 02/05/22   [name], DPM  spironolactone (ALDACTONE) 25 MG tablet Take 25 mg by mouth daily. 02/02/21   [provider]  tadalafil (CIALIS) 20 MG tablet TAKE ONE TABLET BY MOUTH DAILY AS NEEDED FOR ERECTILE DYSFUNCTION 11/28/20   Tower, Wynelle Fanny, MD  torsemide (DEMADEX) 20 MG tablet Take 1 tablet (20 mg total) by mouth daily. 04/06/21   Tower, Wynelle Fanny, MD  potassium chloride (KLOR-CON) 20 MEQ packet Take 20 mEq by mouth 2 (two) times daily.  04/23/11  [provider]    Physical Exam: Vitals:   03/18/22 1804 03/18/22 1807 03/18/22 2107  BP: (!) 106/54  (!) 142/89  Pulse: 87  71  Resp: 16  18  Temp: 97.8 F (36.6 C)  98.3 F (36.8 C)  TempSrc: Oral  Oral  SpO2: 98%  96%  Weight:  98.2 kg   Height:  5\' 11"  (1.803 m)     Physical Exam Vitals reviewed.  Constitutional:      General: He is not in acute distress. HENT:     Head: Normocephalic and atraumatic.  Eyes:     Extraocular Movements: Extraocular movements intact.  Cardiovascular:     Rate and Rhythm: Normal rate and regular rhythm.     Pulses: Normal pulses.  Pulmonary:     Effort: Pulmonary effort is normal. No respiratory distress.     Breath sounds: Normal breath sounds. No wheezing or rales.  Abdominal:      General: Bowel sounds are normal.     Palpations: Abdomen is soft.     Tenderness: There is no abdominal tenderness. There is no guarding.  Musculoskeletal:     Cervical back: Normal range of motion.     Right lower leg: No edema.     Left lower leg: No edema.     Comments: Left foot: See pictures.  Dorsalis pedis pulse intact.  Skin:    General: Skin is warm and dry.  Neurological:     General: No focal deficit present.     Mental Status: He is alert and oriented to person, place, and time.          Labs on Admission: I have personally reviewed following labs and imaging studies  CBC: Recent Labs  Lab 03/18/22 1826  WBC 10.1  NEUTROABS 7.6  HGB 10.6*  HCT 33.2*  MCV 90.2  PLT 073   Basic Metabolic Panel: Recent Labs  Lab 03/18/22 1826  NA 134*  K 5.2*  CL 99  CO2 23  GLUCOSE 236*  BUN 66*  CREATININE 4.11*  CALCIUM 8.9   GFR: Estimated Creatinine Clearance: 22 mL/min (A) (by C-G formula based on SCr of 4.11 mg/dL (H)). Liver Function Tests: No results for input(s): "AST", "ALT", "ALKPHOS", "BILITOT", "PROT", "ALBUMIN" in the last 168 hours. No results for input(s): "LIPASE", "AMYLASE" in the last 168 hours. No results for input(s): "AMMONIA" in the last 168 hours. Coagulation Profile: No results for input(s): "INR", "PROTIME" in the last 168 hours. Cardiac Enzymes: No results for input(s): "CKTOTAL", "CKMB", "CKMBINDEX", "TROPONINI" in the last 168 hours. BNP (last 3 results) No results for input(s): "PROBNP" in the last 8760 hours. HbA1C: No results for input(s): "HGBA1C" in the last 72 hours. CBG: No results for input(s): "GLUCAP" in the last 168 hours. Lipid Profile: No results for input(s): "CHOL", "HDL", "LDLCALC", "TRIG", "CHOLHDL", "LDLDIRECT" in the last 72 hours. Thyroid Function Tests: No results  for input(s): "TSH", "T4TOTAL", "FREET4", "T3FREE", "THYROIDAB" in the last 72 hours. Anemia Panel: No results for input(s): "VITAMINB12",  "FOLATE", "FERRITIN", "TIBC", "IRON", "RETICCTPCT" in the last 72 hours. Urine analysis:    Component Value Date/Time   COLORURINE STRAW (A) 02/02/2020 2247   APPEARANCEUR CLEAR 02/02/2020 2247   LABSPEC 1.026 02/02/2020 2247   PHURINE 5.0 02/02/2020 2247   GLUCOSEU >=500 (A) 02/02/2020 2247   HGBUR SMALL (A) 02/02/2020 2247   [place] NEGATIVE 02/02/2020 2247   [place] 02/02/2020 2247   PROTEINUR 100 (A) 02/02/2020 2247   NITRITE NEGATIVE 02/02/2020 2247   LEUKOCYTESUR NEGATIVE 02/02/2020 2247    Radiological Exams on Admission: DG Foot Complete Left  Result Date: 03/18/2022 CLINICAL DATA:  Ulcer EXAM: LEFT FOOT - COMPLETE 3 VIEW COMPARISON:  None Available. FINDINGS: Soft tissue swelling noted of the great toe. There is cortical irregularity with possible erosion of the distal cortex distal phalanx which is concerning for osteomyelitis. No traumatic osseous abnormalities. There are plantar and posterior calcaneal spurs. No osteolytic or osteoblastic changes. IMPRESSION: Possible osteomyelitis involving the great toe distal phalanx. Soft tissue swelling. Calcaneal spurs. Electronically Signed   By: Sammie Bench M.D.   On: 03/18/2022 09:41    EKG: Independently reviewed.  Sinus rhythm with first-degree AV block.  No significant change since prior tracing.  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  Seen at podiatry office today and underwent excisional debridement of the wound in the office.  X-ray was concerning for possible osteomyelitis involving the left great toe distal phalanx.  Patient was sent to the hospital for IV antibiotics, MRI, and surgical intervention.  No fever, leukocytosis, or signs of sepsis.  MRI of left foot showing bone marrow edema of the distal phalanx of the first digit concerning for osteomyelitis.  Small joint effusion of the interphalangeal joint of  the first digit concerning for septic arthritis.  Soft tissue wound about the dorsal aspect of the first distal phalanx with surrounding edema and inflammatory changes.  No fluid collection or abscess.  Also showing increased signal of the plantar muscle suggesting diabetic myopathy/myositis. -ED PA consulted podiatry (Dr. Posey Pronto) -Continue vancomycin and Zosyn -Blood cultures pending -Keep n.p.o. after midnight  AKI on CKD stage IV Likely secondary to overdiuresis (patient has been taking both furosemide and torsemide at home.  BUN 66, creatinine 4.1 (was 2.9 on labs done a year ago). -Hold diuretics and start gentle IV fluid hydration. -Monitor renal function and urine output -Avoid nephrotoxic agents/contrast -Urine sodium and creatinine -Renal ultrasound  Poorly controlled insulin-dependent type 2 diabetes Last A1c 14.7 in October 2022.  Glucose currently in the 200s. -Repeat A1c -Sensitive sliding scale insulin every 4 hours as patient will be n.p.o. after midnight -Pharmacy med rec pending  Normocytic anemia Hemoglobin currently 10.6, previously >12 on labs done in November 2022.  No signs of active bleeding. -Continue to monitor  Mild hyponatremia -Gentle IV fluid hydration and monitor BMP  Borderline hyperkalemia Likely due to AKI on CKD.  Patient has not taken spironolactone for several months.  No acute EKG changes. -Cardiac monitoring -Hold spironolactone -Monitor BMP  CAD EKG without acute ischemic changes and not endorsing any anginal symptoms. -Pharmacy med rec pending  Chronic HFmrEF Echo done in November 2022 showing EF 40 to 56%, grade 1 diastolic dysfunction.  No signs of volume overload. -Hold diuretics and continue gentle IV fluid hydration given AKI -  Check BNP  COPD: Stable, no signs of acute exacerbation. GERD Hypertension: Systolic currently in the 140s. Hyperlipidemia -Pharmacy med rec pending.  DVT prophylaxis: SCDs Code Status: Full Code  (discussed with the patient) Family Communication: Wife updated. Consults called: Podiatry Level of care: Telemetry bed Admission status: It is my clinical opinion that admission to INPATIENT is reasonable and necessary because of the expectation that this patient will require hospital care that crosses at least 2 midnights to treat this condition based on the medical complexity of the problems presented.  Given the aforementioned information, the predictability of an adverse outcome is felt to be significant.   Shela Leff MD [facility]  If 7PM-7AM, please contact night-coverage [email or web address]  03/18/2022, 9:32 PM

## 2022-03-18 NOTE — ED Triage Notes (Signed)
Patient report left toe infection. Pt report Dr office recommended patient to be seen for possible osteomyelitis on his left foot. Pt denies N/V. Pt a/ox4.

## 2022-03-18 NOTE — Progress Notes (Signed)
Pharmacy Antibiotic Note  Roberto Allen is a 62 y.o. male admitted on 03/18/2022 with  wound infection .  Pharmacy has been consulted for Cefepime + Vancomycin dosing.  MRI Left foot + osteomyelitis of distal phalanx of 1st digit & septic arthritis of interphalangeal joint of 1st digit.  Noted chronic kidney disease.  Scr 4.11 today (previous known Scr ~ 3 in 2022/early 2023)  Plan: Cefepime 2gm IV q24h Metronidazole 500mg  IV q12h Vancomycin 1500 IV q48h to target AUC 400-550.  Estimated AUC 513. Monitor renal function and cx data    Height: 5\' 11"  (180.3 cm) Weight: 98.2 kg (216 lb 6.4 oz) IBW/kg (Calculated) : 75.3  Temp (24hrs), Avg:98 F (36.7 C), Min:97.8 F (36.6 C), Max:98.3 F (36.8 C)  Recent Labs  Lab 03/18/22 1826  WBC 10.1  CREATININE 4.11*    Estimated Creatinine Clearance: 22 mL/min (A) (by C-G formula based on SCr of 4.11 mg/dL (H)).    Allergies  Allergen Reactions   Metformin And Related Other (See Comments)    Due to CHF and CRI    Antimicrobials this admission: 2/2 Cefepime >>  2/2 Metronidazole >> 2/1 Vancomycin >>  2/1 Zosyn x1 in ED  Dose adjustments this admission:  Microbiology results: 2/1 BCx:   Thank you for allowing pharmacy to be a part of this patient's care.  Netta Cedars PharmD 03/18/2022 11:22 PM

## 2022-03-18 NOTE — ED Provider Notes (Signed)
[place] EMERGENCY DEPARTMENT AT [facility] Provider Note   CSN: [id number] Arrival date & time: 03/18/22  1755     History  Chief Complaint  Patient presents with   Left toe infection    Roberto Allen is a 62 y.o. male.  62 year old male presents today for evaluation of left great toe wound infection.  He was seen by podiatry and recommended coming to the ED for concern of osteomyelitis.  Plain radiograph was obtained by podiatry which showed concern for osteomyelitis.  States the wound has been present for 2 to 3 weeks.  Does have history of diabetic neuropathy however states he has good sensation to his lower extremities.  Denies other complaints.  The history is provided by the patient. No language interpreter was used.       Home Medications Prior to Admission medications   Medication Sig Start Date End Date Taking? Authorizing Provider  ACCU-CHEK FASTCLIX LANCETS MISC USE TO MONITOR BLOOD GLUCOSE 3 TIME(S) DAILY Patient not taking: Reported on 01/14/2022 05/05/17   [provider]  allopurinol (ZYLOPRIM) 100 MG tablet Take 100 mg by mouth daily. 11/24/21   [provider]  aspirin EC 81 MG EC tablet Take 1 tablet (81 mg total) by mouth daily. 05/02/19   Geradine Girt, DO  atorvastatin (LIPITOR) 80 MG tablet Take 1 tablet (80 mg total) by mouth daily. 07/20/21   Tower, Wynelle Fanny, MD  BD PEN NEEDLE NANO U/F 32G X 4 MM MISC Inject 1 Units into the skin as needed. Patient not taking: Reported on 01/14/2022 11/05/19   Elby Beck, FNP  Blood Glucose Monitoring Suppl (ONETOUCH VERIO REFLECT) w/Device KIT 1 Units by Does not apply route as needed. Patient not taking: Reported on 01/14/2022 11/28/20   Tower, Wynelle Fanny, MD  carvedilol (COREG) 12.5 MG tablet Take 12.5 mg by mouth 2 (two) times daily. 02/02/21   [provider]  Cholecalciferol (VITAMIN D3) 50 MCG (2000 UT) TABS Take 1 tablet by mouth daily. Patient not taking: Reported on  01/14/2022 07/25/19   [provider]  doxycycline (VIBRA-TABS) 100 MG tablet Take 1 tablet (100 mg total) by mouth 2 (two) times daily for 10 days. 03/18/22 03/28/22  Roberto Allen, DPM  ergocalciferol (VITAMIN D2) 1.25 MG (50000 UT) capsule Take 50,000 Units by mouth once a week. Patient not taking: Reported on 07/08/2021    [provider]  glucose blood (ONETOUCH VERIO) test strip Use to check blood sugar 2 times a day Patient not taking: Reported on 01/14/2022 12/24/20   Roberto Greenspan, MD  HUMALOG KWIKPEN 100 UNIT/ML KwikPen  06/19/19   [provider]  hydrALAZINE (APRESOLINE) 100 MG tablet Take 1 tablet (100 mg total) by mouth 3 (three) times daily. 01/29/21 01/14/22  Alisa Graff, FNP  isosorbide mononitrate (IMDUR) 60 MG 24 hr tablet TAKE 1 TABLET BY MOUTH TWICE A DAY 12/29/21   Tower, Wynelle Fanny, MD  JARDIANCE 25 MG TABS tablet Take 25 mg by mouth daily. 12/03/21   [provider]  LANTUS SOLOSTAR 100 UNIT/ML Solostar Pen Inject 50 Units into the skin at bedtime. 06/16/20   Tower, Wynelle Fanny, MD  magnesium oxide (MAG-OX) 400 (240 Mg) MG tablet TAKE 1 TABLET BY MOUTH EVERY DAY 02/04/21   Tower, Wynelle Fanny, MD  omeprazole (PRILOSEC) 20 MG capsule Take 1 capsule (20 mg total) by mouth daily. 07/20/21   Tower, Wynelle Fanny, MD  OZEMPIC, 1 MG/DOSE, 4 MG/3ML SOPN INJECT 1  MG UNDER SKIN ONCE A WEEK Patient not taking: Reported on 01/14/2022 02/05/21   Tower, Wynelle Fanny, MD  predniSONE (DELTASONE) 20 MG tablet 2 tabs po for 4 days, then 1 tab po for 4 days Patient not taking: Reported on 01/14/2022 07/08/21   Copland, Frederico Hamman, MD  silver sulfADIAZINE (SILVADENE) 1 % cream Apply 1 Application topically daily. 02/05/22   Roberto Allen, DPM  spironolactone (ALDACTONE) 25 MG tablet Take 25 mg by mouth daily. 02/02/21   [provider]  tadalafil (CIALIS) 20 MG tablet TAKE ONE TABLET BY MOUTH DAILY AS NEEDED FOR ERECTILE DYSFUNCTION 11/28/20   Tower, Wynelle Fanny, MD  torsemide  (DEMADEX) 20 MG tablet Take 1 tablet (20 mg total) by mouth daily. 04/06/21   Tower, Wynelle Fanny, MD  potassium chloride (KLOR-CON) 20 MEQ packet Take 20 mEq by mouth 2 (two) times daily.  04/23/11  [provider]      Allergies    Metformin and related    Review of Systems   Review of Systems  Constitutional:  Negative for chills and fever.  Skin:  Positive for wound.  All other systems reviewed and are negative.   Physical Exam Updated Vital Signs BP (!) 106/54 (BP Location: Left Arm)   Pulse 87   Temp 97.8 F (36.6 C) (Oral)   Resp 16   Ht 5\' 11"  (1.803 m)   Wt 98.2 kg   SpO2 98%   BMI 30.18 kg/m  Physical Exam Vitals and nursing note reviewed.  Constitutional:      General: He is not in acute distress.    Appearance: Normal appearance. He is not ill-appearing.  HENT:     Head: Normocephalic and atraumatic.     Nose: Nose normal.  Eyes:     General: No scleral icterus.    Extraocular Movements: Extraocular movements intact.     Conjunctiva/sclera: Conjunctivae normal.  Cardiovascular:     Rate and Rhythm: Normal rate and regular rhythm.     Pulses: Normal pulses.  Pulmonary:     Effort: Pulmonary effort is normal. No respiratory distress.     Breath sounds: Normal breath sounds. No wheezing or rales.  Abdominal:     General: There is no distension.     Tenderness: There is no abdominal tenderness.  Musculoskeletal:        General: Normal range of motion.     Cervical back: Normal range of motion.  Skin:    General: Skin is warm and dry.     Comments: Wound noted to left great toe.  Please see attached image for description.  2+ left DP pulse present.  Neurological:     General: No focal deficit present.     Mental Status: He is alert and oriented to person, place, and time. Mental status is at baseline.     ED Results / Procedures / Treatments   Labs (all labs ordered are listed, but only abnormal results are displayed) Labs Reviewed  CBC WITH  DIFFERENTIAL/PLATELET - Abnormal; Notable for the following components:      Result Value   RBC 3.68 (*)    Hemoglobin 10.6 (*)    HCT 33.2 (*)    RDW 15.7 (*)    All other components within normal limits  BASIC METABOLIC PANEL - Abnormal; Notable for the following components:   Sodium 134 (*)    Potassium 5.2 (*)    Glucose, Bld 236 (*)    BUN 66 (*)  Creatinine, Ser 4.11 (*)    GFR, Estimated 16 (*)    All other components within normal limits  CULTURE, BLOOD (ROUTINE X 2)  CULTURE, BLOOD (ROUTINE X 2)  MAGNESIUM  SEDIMENTATION RATE  C-REACTIVE PROTEIN    EKG None  Radiology DG Foot Complete Left  Result Date: 03/18/2022 CLINICAL DATA:  Ulcer EXAM: LEFT FOOT - COMPLETE 3 VIEW COMPARISON:  None Available. FINDINGS: Soft tissue swelling noted of the great toe. There is cortical irregularity with possible erosion of the distal cortex distal phalanx which is concerning for osteomyelitis. No traumatic osseous abnormalities. There are plantar and posterior calcaneal spurs. No osteolytic or osteoblastic changes. IMPRESSION: Possible osteomyelitis involving the great toe distal phalanx. Soft tissue swelling. Calcaneal spurs. Electronically Signed   By: Sammie Bench M.D.   On: 03/18/2022 09:41    Procedures Procedures    Medications Ordered in ED Medications - No data to display  ED Course/ Medical Decision Making/ A&P Clinical Course as of 03/18/22 2131  Thu Mar 18, 2022  2129 Creatinine(!): 4.11 [AA]  2131 Creatinine(!): 4.11 [AA]    Clinical Course User Index [AA] Evlyn Courier, PA-C                             Medical Decision Making Amount and/or Complexity of Data Reviewed Labs: ordered. Decision-making details documented in ED Course. Radiology: ordered.  Risk Prescription drug management. Decision regarding hospitalization.   Medical Decision Making / ED Course   This patient presents to the ED for concern of left great toe wound, this involves an  extensive number of treatment options, and is a complaint that carries with it a high risk of complications and morbidity.  The differential diagnosis includes osteomyelitis, cellulitis, wound infection  MDM: 62 year old male presents today for evaluation of concern for osteomyelitis.  He had an x-ray done at his podiatry office which raises suspicion for osteomyelitis.  He was recommended to come in for IV antibiotics and MRI.  CBC without leukocytosis.  Hemoglobin of 10.6.  This is 2 g lower than previous baseline from 1 year ago.  BMP shows creatinine of 4.11 last comparison is from 1 year ago where was 2.98.  Patient states 2 weeks ago he was diagnosed with CKD stage V.  This was attributed to taking 2 diuretics at the same time.  This was furosemide and torsemide.  The creatinine bump is consistent with the worsening CKD.  Doubt AKI.  Potassium of 5.2.  Will obtain EKG to ensure there is no acute changes however doubt that given just a mild increase.  Will obtain CRP, sed rate, magnesium, blood cultures, and provide Vanco and Zosyn for concern of osteomyelitis.  MRI without contrast of the left foot ordered.  Will discuss with hospitalist for admission.  Discussed with the hospitalist who will evaluate patient for admission.  I did notify the podiatrist regarding patient's arrival to the ED, and for them to put on list to see tomorrow.  Lab Tests: -I ordered, reviewed, and interpreted labs.   The pertinent results include:   Labs Reviewed  CBC WITH DIFFERENTIAL/PLATELET - Abnormal; Notable for the following components:      Result Value   RBC 3.68 (*)    Hemoglobin 10.6 (*)    HCT 33.2 (*)    RDW 15.7 (*)    All other components within normal limits  BASIC METABOLIC PANEL - Abnormal; Notable for the following components:  Sodium 134 (*)    Potassium 5.2 (*)    Glucose, Bld 236 (*)    BUN 66 (*)    Creatinine, Ser 4.11 (*)    GFR, Estimated 16 (*)    All other components within normal  limits  CULTURE, BLOOD (ROUTINE X 2)  CULTURE, BLOOD (ROUTINE X 2)  MAGNESIUM  SEDIMENTATION RATE  C-REACTIVE PROTEIN      EKG  EKG Interpretation  Date/Time:    Ventricular Rate:    PR Interval:    QRS Duration:   QT Interval:    QTC Calculation:   R Axis:     Text Interpretation:           Imaging Studies ordered: I ordered imaging studies including MRI without contrast which has not resulted prior to admission I independently visualized and interpreted imaging. I agree with the radiologist interpretation   Medicines ordered and prescription drug management: No orders of the defined types were placed in this encounter.   -I have reviewed the patients home medicines and have made adjustments as needed  Critical interventions Zosyn, vancomycin   Reevaluation: After the interventions noted above, I reevaluated the patient and found that they have :stayed the same  Co morbidities that complicate the patient evaluation  Past Medical History:  Diagnosis Date   CAD (coronary artery disease)    a. LHC 4/13: mD1 25%, oD2 25%, pOM1 25%, pRCA 25%, dRCA 40%, EF 30%   CHF (congestive heart failure) (HCC)    Chronic kidney disease (CKD), stage III (moderate) (HCC)    Chronic systolic heart failure (HCC)    a. EF 30-35% in 04/2011, improved to 55-60% in 01/2012.   COPD (chronic obstructive pulmonary disease) (HCC)    Diabetes mellitus    GERD (gastroesophageal reflux disease)    History of pneumonia    Hyperkalemia    a. 12/2012 - spironolactone, KCl supp discontinued.   Hyperlipidemia    Hypertension    Hypertriglyceridemia    a. 12/2012 - started on fenofibrate.   NICM (nonischemic cardiomyopathy) ([place])    a. Echo 3/13: mild LVH, EF 30-35%, grade 3 diast dysfxn, mod LAE;  RHC 4/13:  RA 8, RV 58/10, PA 56/18, mean 35, PCWP mean 17, LV 154/27, CO 4.3, CI 2.0. b. EF Improved >>> Echo (01/2012):  Mild LVH, EF 55-60%, no RWMA, Gr 1 DD      Dispostion: Patient  discussed with hospitalist will evaluate patient for admission.  Final Clinical Impression(s) / ED Diagnoses Final diagnoses:  Osteomyelitis of left foot, unspecified type [facility])    Rx / DC Orders ED Discharge Orders     None         Evlyn Courier, PA-C 03/18/22 2213    Varney Biles, MD 03/18/22 2308

## 2022-03-18 NOTE — Progress Notes (Incomplete)
Pharmacy Antibiotic Note  Roberto Allen is a 62 y.o. male admitted on 03/18/2022 with  wound infection .  Pharmacy has been consulted for Zosyn + Vancomycin dosing.  Plan: Zosyn 3.375g IV q8h (4 hour infusion). Vancomycin Daily Scr while on Vanc/Zosyn combo. Monitor renal function and cx data    Height: 5\' 11"  (180.3 cm) Weight: 98.2 kg (216 lb 6.4 oz) IBW/kg (Calculated) : 75.3  Temp (24hrs), Avg:98 F (36.7 C), Min:97.8 F (36.6 C), Max:98.3 F (36.8 C)  Recent Labs  Lab 03/18/22 1826  WBC 10.1  CREATININE 4.11*    Estimated Creatinine Clearance: 22 mL/min (A) (by C-G formula based on SCr of 4.11 mg/dL (H)).    Allergies  Allergen Reactions  . Metformin And Related Other (See Comments)    Due to CHF and CRI    Antimicrobials this admission: *** *** >> *** *** *** >> ***  Dose adjustments this admission: ***  Microbiology results: 2/1 BCx:  UCx: ***  *** Sputum: ***  *** MRSA PCR: ***  Thank you for allowing pharmacy to be a part of this patient's care.  Netta Cedars 03/18/2022 11:22 PM

## 2022-03-19 DIAGNOSIS — M869 Osteomyelitis, unspecified: Secondary | ICD-10-CM | POA: Diagnosis not present

## 2022-03-19 LAB — BASIC METABOLIC PANEL
Anion gap: 11 (ref 5–15)
BUN: 64 mg/dL — ABNORMAL HIGH (ref 8–23)
CO2: 24 mmol/L (ref 22–32)
Calcium: 9.1 mg/dL (ref 8.9–10.3)
Chloride: 103 mmol/L (ref 98–111)
Creatinine, Ser: 3.93 mg/dL — ABNORMAL HIGH (ref 0.61–1.24)
GFR, Estimated: 16 mL/min — ABNORMAL LOW (ref >60)
Glucose, Bld: 93 mg/dL (ref 70–99)
Potassium: 4.8 mmol/L (ref 3.5–5.1)
Sodium: 138 mmol/L (ref 135–145)

## 2022-03-19 LAB — GLUCOSE, CAPILLARY
Glucose-Capillary: 102 mg/dL — ABNORMAL HIGH (ref 70–99)
Glucose-Capillary: 88 mg/dL (ref 70–99)
Glucose-Capillary: 116 mg/dL — ABNORMAL HIGH (ref 70–99)
Glucose-Capillary: 166 mg/dL — ABNORMAL HIGH (ref 70–99)

## 2022-03-19 LAB — HEMOGLOBIN A1C
Hgb A1c MFr Bld: 8.3 % — ABNORMAL HIGH (ref 4.8–5.6)
Mean Plasma Glucose: 191.51 mg/dL

## 2022-03-19 LAB — CBC
HCT: 32.6 % — ABNORMAL LOW (ref 39.0–52.0)
Hemoglobin: 10.3 g/dL — ABNORMAL LOW (ref 13.0–17.0)
MCH: 28.3 pg (ref 26.0–34.0)
MCHC: 31.6 g/dL (ref 30.0–36.0)
MCV: 89.6 fL (ref 80.0–100.0)
Platelets: 335 10*3/uL (ref 150–400)
RBC: 3.64 MIL/uL — ABNORMAL LOW (ref 4.22–5.81)
RDW: 15.5 % (ref 11.5–15.5)
WBC: 8.9 10*3/uL (ref 4.0–10.5)
nRBC: 0 % (ref 0.0–0.2)

## 2022-03-19 LAB — BRAIN NATRIURETIC PEPTIDE: B Natriuretic Peptide: 27.1 pg/mL (ref 0.0–100.0)

## 2022-03-19 LAB — SODIUM, URINE, RANDOM: Sodium, Ur: 75 mmol/L

## 2022-03-19 LAB — CREATININE, URINE, RANDOM: Creatinine, Urine: 93 mg/dL

## 2022-03-19 LAB — HIV ANTIBODY (ROUTINE TESTING W REFLEX): HIV Screen 4th Generation wRfx: NONREACTIVE

## 2022-03-19 LAB — C-REACTIVE PROTEIN: CRP: 7.3 mg/dL — ABNORMAL HIGH (ref <1.0)

## 2022-03-19 MED ORDER — HYDRALAZINE HCL 25 MG PO TABS
25.0000 mg | ORAL_TABLET | Freq: Three times a day (TID) | ORAL | Status: DC
  Administered 2022-03-19 – 2022-03-21 (×7): 25 mg via ORAL
  Filled 2022-03-19 (×7): qty 1

## 2022-03-19 MED ORDER — SODIUM CHLORIDE 0.9 % IV SOLN: INTRAVENOUS | Status: DC

## 2022-03-19 MED ORDER — ADULT MULTIVITAMIN W/MINERALS CH
1.0000 | ORAL_TABLET | Freq: Every day | ORAL | Status: DC
  Administered 2022-03-19 – 2022-03-22 (×4): 1 via ORAL
  Filled 2022-03-19 (×4): qty 1

## 2022-03-19 MED ORDER — ATORVASTATIN CALCIUM 40 MG PO TABS
80.0000 mg | ORAL_TABLET | Freq: Every day | ORAL | Status: DC
  Administered 2022-03-19 – 2022-03-22 (×4): 80 mg via ORAL
  Filled 2022-03-19 (×4): qty 2

## 2022-03-19 MED ORDER — METRONIDAZOLE 500 MG/100ML IV SOLN
500.0000 mg | Freq: Two times a day (BID) | INTRAVENOUS | Status: DC
  Administered 2022-03-19 – 2022-03-22 (×7): 500 mg via INTRAVENOUS
  Filled 2022-03-19 (×7): qty 100

## 2022-03-19 MED ORDER — ASPIRIN 81 MG PO TBEC
81.0000 mg | DELAYED_RELEASE_TABLET | Freq: Every day | ORAL | Status: DC
  Administered 2022-03-19 – 2022-03-22 (×4): 81 mg via ORAL
  Filled 2022-03-19 (×4): qty 1

## 2022-03-19 MED ORDER — VANCOMYCIN HCL 1500 MG/300ML IV SOLN: 1500.0000 mg | INTRAVENOUS | Status: DC

## 2022-03-19 MED ORDER — SODIUM CHLORIDE 0.9 % IV SOLN
2.0000 g | INTRAVENOUS | Status: DC
  Administered 2022-03-19 – 2022-03-22 (×4): 2 g via INTRAVENOUS
  Filled 2022-03-19 (×4): qty 12.5

## 2022-03-19 MED ORDER — JUVEN PO PACK
1.0000 | PACK | Freq: Two times a day (BID) | ORAL | Status: DC
  Administered 2022-03-19 – 2022-03-22 (×5): 1 via ORAL
  Filled 2022-03-19 (×5): qty 1

## 2022-03-19 MED ORDER — ENSURE MAX PROTEIN PO LIQD
11.0000 [oz_av] | Freq: Two times a day (BID) | ORAL | Status: DC
  Administered 2022-03-19 – 2022-03-22 (×6): 11 [oz_av] via ORAL
  Filled 2022-03-19 (×8): qty 330

## 2022-03-19 MED ORDER — CARVEDILOL 3.125 MG PO TABS
3.1250 mg | ORAL_TABLET | Freq: Two times a day (BID) | ORAL | Status: DC
  Administered 2022-03-19 – 2022-03-22 (×6): 3.125 mg via ORAL
  Filled 2022-03-19 (×6): qty 1

## 2022-03-19 NOTE — Telephone Encounter (Signed)
Patient was previously cleared for a similar procedure in November by my colleague.  I was going to call him to see if I can go ahead and clear him over the phone.  Unfortunately patient ended up in the hospital with osteomyelitis.  Will continue to follow-up on the side and call the patient after he leaves the hospital.

## 2022-03-19 NOTE — Progress Notes (Signed)
Initial Nutrition Assessment  INTERVENTION:   -Ensure MAX Protein po BID, each supplement provides 150 kcal and 30 grams of protein   -1 packet Juven BID, each packet provides 95 calories, 2.5 grams of protein (collagen), and 9.8 grams of carbohydrate (3 grams sugar); also contains 7 grams of L-arginine and L-glutamine, 300 mg vitamin C, 15 mg vitamin E, 1.2 mcg vitamin B-12, 9.5 mg zinc, 200 mg calcium, and 1.5 g  Calcium Beta-hydroxy-Beta-methylbutyrate to support wound healing   -Multivitamin with minerals daily  -Checking Vitamin D and B-12 labs in AM given poor PO and recent weight loss  -Placed "Carbohydrate Counting" handout in AVS  NUTRITION DIAGNOSIS:   Increased nutrient needs related to wound healing as evidenced by estimated needs.  GOAL:   Patient will meet greater than or equal to 90% of their needs  MONITOR:   PO intake, Supplement acceptance, Labs, Weight trends, I & O's, Skin  REASON FOR ASSESSMENT:   Consult Wound healing  ASSESSMENT:   62 year old male with history of coronary artery  disease, CHF, CKD stage IV, COPD, insulin-dependent diabetes type 2, GERD, hypertension, hyperlipidemia who was sent by his podiatrist for the concern of left great toe infection.  He recently had excisional debridement of the wound on the left great toe by his podiatrist.  X-ray done at office was concerning for possible osteomyelitis involving the left great toe distal phalanx.  Patient in room, wife at bedside. Pt reports feeling okay today. Per pt's wife, pt has not had a good appetite since starting back on his Ozempic in December 2023. Pt had been off the medication for a few months and has had issues ever since restarting. Pt was also sick with a cold a week ago which didn't help his appetite. Pt "picks" at food and eats little portions of things. Pt's wife states his watch and wedding ring fit looser.   Pt expected to have left hallux amputation on 2/4.   Pt agreeable to  drinking protein shakes and taking vitamins to aid in wound healing. Will place diabetes diet information in AVS, pt has had diet education in the outpatient setting in the past.   Per patient, UBW ~230 lbs.  Pt has lost 14 lbs since December 2023 (6% wt loss x >1 month, insignificant for time frame).   Medications reviewed.  Labs reviewed: CBGs: 88-102 Elevated Mg   NUTRITION - FOCUSED PHYSICAL EXAM:  No depletions noted.  Diet Order:   Diet Order             Diet Carb Modified Fluid consistency: Thin; Room service appropriate? Yes  Diet effective now                   EDUCATION NEEDS:   Education needs have been addressed  Skin:  Skin Assessment: Reviewed RN Assessment  Last BM:  2/1  Height:   Ht Readings from Last 1 Encounters:  03/18/22 5\' 11"  (1.803 m)    Weight:   Wt Readings from Last 1 Encounters:  03/18/22 98.2 kg    BMI:  Body mass index is 30.18 kg/m.  Estimated Nutritional Needs:   Kcal:  2200-2400  Protein:  110-120g  Fluid:  2.2L/day  Clayton Bibles, MS, RD, LDN Inpatient Clinical Dietitian Contact information available via Amion

## 2022-03-19 NOTE — TOC Initial Note (Signed)
Transition of Care [facility]) - Initial/Assessment Note    Patient Details  Name: Roberto Allen MRN: [id number] Date of Birth: [birth year]  [facility] of [facility]) CM/SW Contact:    Vassie Moselle, LCSW Phone Number: 03/19/2022, 12:45 PM  Clinical Narrative:                 [facility] consulted for HH/DME needs. TOC will continue to follow for discharge recommendations and plans.   Expected Discharge Plan: Home/Self Care Barriers to Discharge: Continued Medical Work up   Patient Goals and CMS Choice Patient states their goals for this hospitalization and ongoing recovery are:: To go home CMS Medicare.gov Compare Post Acute Care list provided to:: Patient Choice offered to / list presented to : Patient      Expected Discharge Plan and Services In-house Referral: Clinical Social Work Discharge Planning Services: NA   Living arrangements for the past 2 months: [place]                                      Prior Living Arrangements/Services Living arrangements for the past 2 months: Single Family Home   Patient language and need for interpreter reviewed:: Yes Do you feel safe going back to the place where you live?: Yes      Need for Family Participation in Patient Care: No (Comment) Care giver support system in place?: No (comment)   Criminal Activity/Legal Involvement Pertinent to Current Situation/Hospitalization: No - Comment as needed  Activities of Daily Living      Permission Sought/Granted   Permission granted to share information with : No              Emotional Assessment       Orientation: : Oriented to Self, Oriented to Place, Oriented to  Time, Oriented to Situation Alcohol / Substance Use: Not Applicable Psych Involvement: No (comment)  Admission diagnosis:  Osteomyelitis ([place]) [M86.9] Osteomyelitis of left foot, unspecified type ([place]) [M86.9] Patient Active Problem List   Diagnosis Date Noted   Osteomyelitis of great toe of left  foot ([place]) 03/18/2022   Acute renal failure superimposed on stage 4 chronic kidney disease ([place]) 03/18/2022   Normocytic anemia 03/18/2022   Hyponatremia 03/18/2022   Prostate cancer screening 01/24/2022   Right knee pain 05/27/2021   Dyspnea 03/04/2021   Pedal edema 02/03/2021   Dilated cardiomyopathy ([place]) 12/19/2020   CHF exacerbation ([place]) 12/16/2020   Acute on chronic diastolic CHF (congestive heart failure) ([place]) 12/16/2020   Hypokalemia 12/16/2020   CHF (congestive heart failure) ([place]) 04/28/2019   Hypertension secondary to other renal disorders 11/29/2016   GERD (gastroesophageal reflux disease) 07/23/2016   Degenerative disc disease at L5-S1 level 04/09/2016   Primary osteoarthritis of right hip 04/09/2016   Obesity, Class I, BMI 30-34.9 02/06/2016   Ulcer of left foot ([place]) 01/06/2015   Microalbuminuria due to type 2 diabetes mellitus ([place]) 10/03/2014   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 diabetes mellitus ([place]) 06/26/2014   NICM (nonischemic cardiomyopathy) ([place]) 09/06/2013   ED (erectile dysfunction) 03/27/2013   Disorder of kidney and ureter 12/17/2012   Hyperkalemia 12/16/2012   DM (diabetes mellitus), type 2 with renal complications ([place]) 39/04/90   Coronary Artery Disease (non-obstructive by cath 05/2011) 06/14/2011   Hyperlipidemia associated with type 2 diabetes mellitus ([place]) 06/14/2011   OSA (obstructive sleep apnea) 05/25/2011   Hypertension    PCP:  Holley Raring,  Aldean Baker, MD Pharmacy:   [id number] [place], [place] - [place] [address] [place] [address] Phone: [phone] Fax: [phone]  [facility] [place] [id number], [place] - [place] [address] Phone: [phone] Fax: [phone]  [place] [place] [address] [place] [address] [place] [address] Phone: [phone] Fax: [phone]     Social  Determinants of Health (SDOH) Social History: SDOH Screenings   Depression (PHQ2-9): Low Risk  (01/29/2021)  Tobacco Use: Medium Risk (03/18/2022)   SDOH Interventions:     Readmission Risk Interventions    03/19/2022   12:44 PM  Readmission Risk Prevention Plan  Post Dischage Appt Complete  Medication Screening Complete  Transportation Screening Complete

## 2022-03-19 NOTE — Discharge Instructions (Signed)

## 2022-03-19 NOTE — Plan of Care (Deleted)

## 2022-03-19 NOTE — Consult Note (Signed)
  Subjective:  Patient ID: Roberto Allen, male    DOB: February 01, 1961,  MRN: [id number]  A 62 y.o. male medical history significant of CAD, chronic HFmrEF, CKD stage IV, COPD, insulin-dependent type 2 diabetes, GERD, hypertension, hyperlipidemia.  He was seen by podiatry today for follow-up of left great toe ulcer with underlying osteomyelitis.  Patient states that he is doing okay.  No nausea fever chills vomiting.  He wanted to discuss his next treatment options he wanted know when his surgery is scheduled.  Objective:   Vitals:   03/19/22 0340 03/19/22 0758  BP: (!) 141/102 (!) 141/82  Pulse: 70 76  Resp: 18 18  Temp: 98.1 F (36.7 C) 98.5 F (36.9 C)  SpO2: 97% 99%   General AA&O x3. Normal mood and affect.  Vascular Dorsalis pedis and posterior tibial pulses 2/4 bilat. Brisk capillary refill to all digits. Pedal hair present.  Neurologic Epicritic sensation grossly intact.  Dermatologic ft plantar hallux hyperkertosis upon debridement ulceration noted with necrotic tissue and purulence noted. Does probe to bone. Malodor noted. Erythema and edema surrounding the toe. . Right plantar first metatarsal hyperkeratosis upon debridement also is healed.   Orthopedic: MMT 5/5 in dorsiflexion, plantarflexion, inversion, and eversion. Normal joint ROM without pain or crepitus.   IMPRESSION: 1. Bone marrow edema of the distal phalanx of the first digit concerning for osteomyelitis. 2. Small joint effusion of the interphalangeal joint of the first digit concerning for septic arthritis. 3. Soft tissue wound about the dorsal aspect of the first distal phalanx with surrounding edema and inflammatory changes. No fluid collection or abscess. 4. 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 Assessment & Plan:  Patient was evaluated and treated and all questions answered.  Left hallux ulceration with underlying osteomyelitis -All questions or concerns were  discussed with the patient in extensive detail. -MRI confirmed osteomyelitis of the left great toe. -Will plan for surgery Sunday morning for left hallux amputation -N.p.o. after midnight Saturday -Weightbearing as tolerated surgical shoe -Continue IV antibiotics -Betadine wet-to-dry dressing change daily.  [name], DPM  Accessible via secure chat for questions or concerns.

## 2022-03-19 NOTE — Inpatient Diabetes Management (Signed)
Inpatient Diabetes Program Recommendations  AACE/ADA: New Consensus Statement on Inpatient Glycemic Control (2015)  Target Ranges:  Prepandial:   less than 140 mg/dL      Peak postprandial:   less than 180 mg/dL (1-2 hours)      Critically ill patients:  140 - 180 mg/dL   Lab Results  Component Value Date   GLUCAP 88 03/19/2022   HGBA1C 8.3 (H) 03/19/2022    Review of Glycemic Control  Diabetes history: DM2 Outpatient Diabetes medications: Toujeo 50 QHS, Humalog 20 TID prn, Jardiance 25 QD, Ozempic 2 mg weekly Current orders for Inpatient glycemic control: Novolog 0-9 units Q4H  HgbA1C - 8.3%, likely not accurate with low H/H CBGs 93, 102, 88 236 mg/dL on admission  Inpatient Diabetes Program Recommendations:    Spoke with pt and wife at bedside regarding his diabetes control. Pt states he hasn't eaten well in last couple of days. Verified home meds and pt states he had just started back on Ozempic a few weeks ago. Was having trouble getting it at pharmacy d/t shortage. Discussed HgbA1C of 8.3%. Pt will likely need small amount of basal insulin with taking po's. Consider starting Semglee 12 units in am if blood sugars > 180 mg/dL.  For surgery on 2/4, therefore will be NPO after MN on 2/4.  Agree with orders at present. Will watch trends.  Pt and wife appreciative of visit.  Thank you. Lorenda Peck, RD, LDN, [place] Inpatient Diabetes Coordinator [phone]

## 2022-03-19 NOTE — Plan of Care (Signed)
Problem: Education: Goal: Ability to describe self-care measures that may prevent or decrease complications (Diabetes Survival Skills Education) will improve 03/19/2022 1944 by Blake Divine, RN Outcome: Progressing 03/19/2022 1943 by Blake Divine, RN Outcome: Adequate for Discharge Goal: Individualized Educational Video(s) 03/19/2022 1944 by Blake Divine, RN Outcome: Progressing 03/19/2022 1943 by Blake Divine, RN Outcome: Adequate for Discharge   Problem: Coping: Goal: Ability to adjust to condition or change in health will improve 03/19/2022 1944 by Blake Divine, RN Outcome: Progressing 03/19/2022 1943 by Blake Divine, RN Outcome: Adequate for Discharge   Problem: Fluid Volume: Goal: Ability to maintain a balanced intake and output will improve 03/19/2022 1944 by Blake Divine, RN Outcome: Progressing 03/19/2022 1943 by Blake Divine, RN Outcome: Adequate for Discharge   Problem: Health Behavior/Discharge Planning: Goal: Ability to identify and utilize available resources and services will improve 03/19/2022 1944 by Blake Divine, RN Outcome: Progressing 03/19/2022 1943 by Blake Divine, RN Outcome: Adequate for Discharge Goal: Ability to manage health-related needs will improve 03/19/2022 1944 by Blake Divine, RN Outcome: Progressing 03/19/2022 1943 by Blake Divine, RN Outcome: Adequate for Discharge   Problem: Metabolic: Goal: Ability to maintain appropriate glucose levels will improve 03/19/2022 1944 by Blake Divine, RN Outcome: Progressing 03/19/2022 1943 by Blake Divine, RN Outcome: Adequate for Discharge   Problem: Nutritional: Goal: Maintenance of adequate nutrition will improve 03/19/2022 1944 by Blake Divine, RN Outcome: Progressing 03/19/2022 1943 by Blake Divine, RN Outcome: Adequate for Discharge Goal: Progress toward achieving an optimal weight will improve 03/19/2022 1944 by Blake Divine, RN Outcome: Progressing 03/19/2022 1943 by Blake Divine,  RN Outcome: Adequate for Discharge   Problem: Skin Integrity: Goal: Risk for impaired skin integrity will decrease 03/19/2022 1944 by Blake Divine, RN Outcome: Progressing 03/19/2022 1943 by Blake Divine, RN Outcome: Adequate for Discharge   Problem: Tissue Perfusion: Goal: Adequacy of tissue perfusion will improve 03/19/2022 1944 by Blake Divine, RN Outcome: Progressing 03/19/2022 1943 by Blake Divine, RN Outcome: Adequate for Discharge   Problem: Education: Goal: Knowledge of General Education information will improve Description: Including pain rating scale, medication(s)/side effects and non-pharmacologic comfort measures 03/19/2022 1944 by Blake Divine, RN Outcome: Progressing 03/19/2022 1943 by Blake Divine, RN Outcome: Adequate for Discharge   Problem: Health Behavior/Discharge Planning: Goal: Ability to manage health-related needs will improve 03/19/2022 1944 by Blake Divine, RN Outcome: Progressing 03/19/2022 1943 by Blake Divine, RN Outcome: Adequate for Discharge   Problem: Clinical Measurements: Goal: Ability to maintain clinical measurements within normal limits will improve 03/19/2022 1944 by Blake Divine, RN Outcome: Progressing 03/19/2022 1943 by Blake Divine, RN Outcome: Adequate for Discharge Goal: Will remain free from infection 03/19/2022 1944 by Blake Divine, RN Outcome: Progressing 03/19/2022 1943 by Blake Divine, RN Outcome: Adequate for Discharge Goal: Diagnostic test results will improve 03/19/2022 1944 by Blake Divine, RN Outcome: Progressing 03/19/2022 1943 by Blake Divine, RN Outcome: Adequate for Discharge Goal: Respiratory complications will improve 03/19/2022 1944 by Blake Divine, RN Outcome: Progressing 03/19/2022 1943 by Blake Divine, RN Outcome: Adequate for Discharge Goal: Cardiovascular complication will be avoided 03/19/2022 1944 by Blake Divine, RN Outcome: Progressing 03/19/2022 1943 by Blake Divine, RN Outcome: Adequate  for Discharge   Problem: Activity: Goal: Risk for activity intolerance will decrease 03/19/2022 1944 by Blake Divine, RN Outcome: Progressing 03/19/2022 1943 by Blake Divine, RN Outcome: Adequate for Discharge   Problem: Nutrition: Goal: Adequate nutrition will be maintained 03/19/2022 1944 by Blake Divine, RN Outcome: Progressing 03/19/2022 1943 by Blake Divine, RN Outcome: Adequate for Discharge  Problem: Coping: Goal: Level of anxiety will decrease 03/19/2022 1944 by Blake Divine, RN Outcome: Progressing 03/19/2022 1943 by Blake Divine, RN Outcome: Adequate for Discharge   Problem: Elimination: Goal: Will not experience complications related to bowel motility 03/19/2022 1944 by Blake Divine, RN Outcome: Progressing 03/19/2022 1943 by Blake Divine, RN Outcome: Adequate for Discharge Goal: Will not experience complications related to urinary retention 03/19/2022 1944 by Blake Divine, RN Outcome: Progressing 03/19/2022 1943 by Blake Divine, RN Outcome: Adequate for Discharge   Problem: Pain Managment: Goal: General experience of comfort will improve 03/19/2022 1944 by Blake Divine, RN Outcome: Progressing 03/19/2022 1943 by Blake Divine, RN Outcome: Adequate for Discharge   Problem: Safety: Goal: Ability to remain free from injury will improve 03/19/2022 1944 by Blake Divine, RN Outcome: Progressing 03/19/2022 1943 by Blake Divine, RN Outcome: Adequate for Discharge   Problem: Skin Integrity: Goal: Risk for impaired skin integrity will decrease 03/19/2022 1944 by Blake Divine, RN Outcome: Progressing 03/19/2022 1943 by Blake Divine, RN Outcome: Adequate for Discharge

## 2022-03-19 NOTE — Progress Notes (Signed)
PROGRESS NOTE  Roberto Allen  HFW:[id number] DOB: [birth year] DOA: 03/18/2022 PCP: [name], MD   Brief Narrative: Patient is a 62 year old male with history of coronary artery  disease, CHF, CKD stage IV, COPD, insulin-dependent diabetes type 2, GERD, hypertension, hyperlipidemia who was sent by his podiatrist for the concern of left great toe infection.  He recently had excisional debridement of the wound on the left great toe by his podiatrist.  X-ray done at office was concerning for possible osteomyelitis involving the left great toe distal phalanx.  Sent to the emergency room for IV antibiotics, surgical intervention.  On presentation he was afebrile, hemodynamically stable.  Lab work did not show leukocytosis but showed potassium of 5.2, creatinine of 4.1.  Elevated CRP, ESR.  Patient was started on broad spectrum antibiotics.  MRI showed  bone marrow edema of the distal phalanx of the first digit concerning for osteomyelitis,small joint effusion of the interphalangeal joint of the first digit concerning for septic arthritis.  Podiatry planning for left amputation on Sunday  Assessment & Plan:  Principal Problem:   Osteomyelitis of great toe of left foot (HCC) Active Problems:   Hypertension   Coronary Artery Disease (non-obstructive by cath 05/2011)   Hyperlipidemia associated with type 2 diabetes mellitus (HCC)   Hyperkalemia   DM (diabetes mellitus), type 2 with renal complications (HCC)   CHF (congestive heart failure) (HCC)   Acute renal failure superimposed on stage 4 chronic kidney disease (HCC)   Normocytic anemia   Hyponatremia  Left great toe osteomyelitis/septic arthritis: History of poorly controlled insulin-dependent diabetes type 2. History as above. Podiatry following.  Plan for left toe amputation on Sunday. continue broad spectrum antibiotics, follow-up cultures. Imagings as above.  AKI on CKD stage IV: Creatinine was 2.9 as per the labs done a year ago.   Creatinine was 4.1 on presentation.  Diuretics on hold, started on gentle IV fluids with improvement.  Monitor renal function. Renal ultrasound did not show any significant sonographic abnormality of the kidneys.  Avoid nephrotoxins. Hyperkalemia resolved.  Spironolactone on hold. He follows with Dr. Isaias Sakai at  at [facility]  Poorly controlled insulin-dependent type 2: Recent A1c of 8.3.  Takes insulin at home. Also takes [place]. Continue sliding scale insulin for now.  Monitor blood sugars  Normocytic anemia: Likely history with CKD.  Currently hemoglobin stable in the range of 10  Hyponatremia: Resolved  History of chronic combined systolic/diastolic CHF: Echo done on November 2 102 showed EF of 40 to 02%, grade 1 diastolic dysfunction.  No sign of volume overload.  Home diuretics on hold, torsemide.    Currently on gentle IVF for today.  Assess volume status tomorrow  Coronary artery disease: No anginal symptoms.Takes aspirin at home  COPD: Currently not in exacerbation.  Continue bronchodilators as needed  GERD: Takes PPI  Hypertension: Monitor blood pressure.  On carvedilol, hydralazine at home  Hyperlipidemia: on lipitor       DVT prophylaxis:SCDs Start: 03/18/22 2311     Code Status: Full Code  Family Communication: Wife at bedside  Patient status:Inpatient  Patient is from :Home  Anticipated discharge DX:AJOI  Estimated DC date:Not sure, after podiatry clearance   Consultants: Podiatry  Procedures:None yet  Antimicrobials:  Anti-infectives (From admission, onward)    Start     Dose/Rate Route Frequency Ordered Stop   03/20/22 2300  vancomycin (VANCOREADY) IVPB 1500 mg/300 mL        1,500 mg 150 mL/hr over 120 Minutes Intravenous  Every 48 hours 03/19/22 0027     03/19/22 0600  ceFEPIme (MAXIPIME) 2 g in sodium chloride 0.9 % 100 mL IVPB        2 g 200 mL/hr over 30 Minutes Intravenous Every 24 hours 03/19/22 0027     03/19/22 0600   metroNIDAZOLE (FLAGYL) IVPB 500 mg        500 mg 100 mL/hr over 60 Minutes Intravenous Every 12 hours 03/19/22 0027     03/18/22 2045  vancomycin (VANCOREADY) IVPB 2000 mg/400 mL        2,000 mg 200 mL/hr over 120 Minutes Intravenous STAT 03/18/22 2034 03/19/22 0107   03/18/22 2045  piperacillin-tazobactam (ZOSYN) IVPB 3.375 g        3.375 g 100 mL/hr over 30 Minutes Intravenous STAT 03/18/22 2034 03/18/22 2150       Subjective: Patient seen and examined at bedside today this morning.  Hemodynamically stable.  Lying in bed.  Denies new complaints.  Pain in the left toe is controlled.  No new complaints  Objective: Vitals:   03/18/22 2309 03/19/22 0025 03/19/22 0340 03/19/22 0758  BP: (!) 144/78 (!) 143/83 (!) 141/102 (!) 141/82  Pulse: 73 70 70 76  Resp: 18 19 18 18   Temp: 98 F (36.7 C) 97.6 F (36.4 C) 98.1 F (36.7 C) 98.5 F (36.9 C)  TempSrc: Oral Oral Oral Oral  SpO2: 95% 97% 97% 99%  Weight:      Height:        Intake/Output Summary (Last 24 hours) at 03/19/2022 8338 Last data filed at 03/19/2022 0500 Gross per 24 hour  Intake 50 ml  Output --  Net 50 ml   Filed Weights   03/18/22 1807  Weight: 98.2 kg    Examination:  General exam: Overall comfortable, not in distress HEENT: PERRL Respiratory system:  no wheezes or crackles  Cardiovascular system: S1 & S2 heard, RRR.  Gastrointestinal system: Abdomen is nondistended, soft and nontender. Central nervous system: Alert and oriented Extremities: No edema, no clubbing ,no cyanosis, ulcer on the left toe Skin: No rashes,no icterus     Data Reviewed: I have personally reviewed following labs and imaging studies  CBC: Recent Labs  Lab 03/18/22 1826 03/19/22 0439  WBC 10.1 8.9  NEUTROABS 7.6  --   HGB 10.6* 10.3*  HCT 33.2* 32.6*  MCV 90.2 89.6  PLT 354 250   Basic Metabolic Panel: Recent Labs  Lab 03/18/22 1826 03/18/22 2156 03/19/22 0439  NA 134*  --  138  K 5.2*  --  4.8  CL 99  --  103   CO2 23  --  24  GLUCOSE 236*  --  93  BUN 66*  --  64*  CREATININE 4.11*  --  3.93*  CALCIUM 8.9  --  9.1  MG  --  2.6*  --      No results found for this or any previous visit (from the past 240 hour(s)).   Radiology Studies: US RENAL  Result Date: 03/18/2022 CLINICAL DATA:  Acute kidney injury EXAM: RENAL / URINARY TRACT ULTRASOUND COMPLETE COMPARISON:  None Available. FINDINGS: Right Kidney: Renal measurements: 9.3 x 4.6 x 4.9 cm = volume: 108 mL. Echogenicity within normal limits. No mass or hydronephrosis visualized. Left Kidney: Renal measurements: 10.0 x 5.6 x 5.6 cm = volume: 163 mL. Echogenicity within normal limits. No mass or hydronephrosis visualized. Bladder: Appears normal for degree of bladder distention. Other: None. IMPRESSION: No significant sonographic abnormality of  the kidneys. Electronically Signed   By: Ronney Asters M.D.   On: 03/18/2022 23:42   MR FOOT LEFT WO CONTRAST  Result Date: 03/18/2022 CLINICAL DATA:  Left great toe wound infection, concern for osteomyelitis EXAM: MRI OF THE LEFT FOOT WITHOUT CONTRAST TECHNIQUE: Multiplanar, multisequence MR imaging of the left forefoot was performed. No intravenous contrast was administered. COMPARISON:  Radiograph performed earlier on the same date FINDINGS: Bones/Joint/Cartilage There is bone marrow edema of the distal phalanx of the first digit. There is also small joint effusion in the interphalangeal joint of the first digit. Marrow signal within remaining osseous structures is within normal limits Ligaments Lisfranc and collateral ligaments appear intact. 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 Soft tissues Marked subcutaneous soft tissue edema about the foot prominent about the dorsum of the foot. Deep skin wound about the dorsal aspect of the first distal phalanx with surrounding edema and inflammatory changes. No fluid  collection or abscess IMPRESSION: 1. Bone marrow edema of the distal phalanx of the first digit concerning for osteomyelitis. 2. Small joint effusion of the interphalangeal joint of the first digit concerning for septic arthritis. 3. Soft tissue wound about the dorsal aspect of the first distal phalanx with surrounding edema and inflammatory changes. No fluid collection or abscess. 4. 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 Electronically Signed   By: Keane Police D.O.   On: 03/18/2022 22:48   DG Foot Complete Left  Result Date: 03/18/2022 CLINICAL DATA:  Ulcer EXAM: LEFT FOOT - COMPLETE 3 VIEW COMPARISON:  None Available. FINDINGS: Soft tissue swelling noted of the great toe. There is cortical irregularity with possible erosion of the distal cortex distal phalanx which is concerning for osteomyelitis. No traumatic osseous abnormalities. There are plantar and posterior calcaneal spurs. No osteolytic or osteoblastic changes. IMPRESSION: Possible osteomyelitis involving the great toe distal phalanx. Soft tissue swelling. Calcaneal spurs. Electronically Signed   By: Sammie Bench M.D.   On: 03/18/2022 09:41    Scheduled Meds:  insulin aspart  0-9 Units Subcutaneous Q4H   Continuous Infusions:  sodium chloride     ceFEPime (MAXIPIME) IV     metronidazole     [START ON 03/20/2022] vancomycin       LOS: 1 day   Shelly Coss, MD [facility] P2/03/2022, 8:03 AM

## 2022-03-20 DIAGNOSIS — N179 Acute kidney failure, unspecified: Secondary | ICD-10-CM

## 2022-03-20 DIAGNOSIS — E1169 Type 2 diabetes mellitus with other specified complication: Principal | ICD-10-CM

## 2022-03-20 DIAGNOSIS — E785 Hyperlipidemia, unspecified: Secondary | ICD-10-CM

## 2022-03-20 DIAGNOSIS — E1121 Type 2 diabetes mellitus with diabetic nephropathy: Secondary | ICD-10-CM

## 2022-03-20 DIAGNOSIS — I10 Essential (primary) hypertension: Secondary | ICD-10-CM

## 2022-03-20 DIAGNOSIS — E871 Hypo-osmolality and hyponatremia: Secondary | ICD-10-CM | POA: Diagnosis not present

## 2022-03-20 DIAGNOSIS — M869 Osteomyelitis, unspecified: Secondary | ICD-10-CM | POA: Diagnosis not present

## 2022-03-20 DIAGNOSIS — D649 Anemia, unspecified: Secondary | ICD-10-CM

## 2022-03-20 DIAGNOSIS — Z794 Long term (current) use of insulin: Secondary | ICD-10-CM

## 2022-03-20 DIAGNOSIS — I2510 Atherosclerotic heart disease of native coronary artery without angina pectoris: Secondary | ICD-10-CM

## 2022-03-20 DIAGNOSIS — N184 Chronic kidney disease, stage 4 (severe): Secondary | ICD-10-CM

## 2022-03-20 LAB — BASIC METABOLIC PANEL
Anion gap: 9 (ref 5–15)
BUN: 70 mg/dL — ABNORMAL HIGH (ref 8–23)
CO2: 22 mmol/L (ref 22–32)
Calcium: 9.2 mg/dL (ref 8.9–10.3)
Chloride: 107 mmol/L (ref 98–111)
Creatinine, Ser: 3.41 mg/dL — ABNORMAL HIGH (ref 0.61–1.24)
GFR, Estimated: 19 mL/min — ABNORMAL LOW (ref >60)
Glucose, Bld: 119 mg/dL — ABNORMAL HIGH (ref 70–99)
Potassium: 4.6 mmol/L (ref 3.5–5.1)
Sodium: 138 mmol/L (ref 135–145)

## 2022-03-20 LAB — URINALYSIS, COMPLETE (UACMP) WITH MICROSCOPIC
Bacteria, UA: NONE SEEN
Bilirubin Urine: NEGATIVE
Glucose, UA: >=500 mg/dL — AB
Hgb urine dipstick: NEGATIVE
Ketones, ur: NEGATIVE mg/dL
Leukocytes,Ua: NEGATIVE
Nitrite: NEGATIVE
Protein, ur: 30 mg/dL — AB
Specific Gravity, Urine: 1.016 (ref 1.005–1.030)
pH: 6 (ref 5.0–8.0)

## 2022-03-20 LAB — GLUCOSE, CAPILLARY
Glucose-Capillary: 120 mg/dL — ABNORMAL HIGH (ref 70–99)
Glucose-Capillary: 120 mg/dL — ABNORMAL HIGH (ref 70–99)
Glucose-Capillary: 126 mg/dL — ABNORMAL HIGH (ref 70–99)
Glucose-Capillary: 127 mg/dL — ABNORMAL HIGH (ref 70–99)
Glucose-Capillary: 127 mg/dL — ABNORMAL HIGH (ref 70–99)
Glucose-Capillary: 129 mg/dL — ABNORMAL HIGH (ref 70–99)

## 2022-03-20 LAB — VITAMIN B12: Vitamin B-12: 439 pg/mL (ref 180–914)

## 2022-03-20 MED ORDER — VANCOMYCIN HCL 1250 MG/250ML IV SOLN
1250.0000 mg | INTRAVENOUS | Status: DC
  Administered 2022-03-20: 1250 mg via INTRAVENOUS
  Filled 2022-03-20: qty 250

## 2022-03-20 MED ORDER — PANTOPRAZOLE SODIUM 40 MG PO TBEC
40.0000 mg | DELAYED_RELEASE_TABLET | Freq: Every day | ORAL | Status: DC
  Administered 2022-03-20 – 2022-03-22 (×3): 40 mg via ORAL
  Filled 2022-03-20 (×3): qty 1

## 2022-03-20 NOTE — Progress Notes (Signed)
I attest to student documentation.  Ammie Ferrier, MSN-RN [place]

## 2022-03-20 NOTE — Progress Notes (Signed)
PROGRESS NOTE    Roberto Allen  ZOX:[id number] DOB: November 04, 1960 DOA: 03/18/2022 PCP: [name], MD    Chief Complaint  Patient presents with   Left toe infection    Brief Narrative:  Patient is a 62 year old male with history of coronary artery  disease, CHF, CKD stage IV, COPD, insulin-dependent diabetes type 2, GERD, hypertension, hyperlipidemia who was sent by his podiatrist for the concern of left great toe infection.  He recently had excisional debridement of the wound on the left great toe by his podiatrist.  X-ray done at office was concerning for possible osteomyelitis involving the left great toe distal phalanx.  Sent to the emergency room for IV antibiotics, surgical intervention.  On presentation he was afebrile, hemodynamically stable.  Lab work did not show leukocytosis but showed potassium of 5.2, creatinine of 4.1.  Elevated CRP, ESR.  Patient was started on broad spectrum antibiotics.  MRI showed  bone marrow edema of the distal phalanx of the first digit concerning for osteomyelitis,small joint effusion of the interphalangeal joint of the first digit concerning for septic arthritis.  Podiatry planning for left amputation on Sunday    Assessment & Plan:   Principal Problem:   Osteomyelitis of great toe of left foot (HCC) Active Problems:   Hypertension   Coronary Artery Disease (non-obstructive by cath 05/2011)   Hyperlipidemia associated with type 2 diabetes mellitus (HCC)   Hyperkalemia   DM (diabetes mellitus), type 2 with renal complications (HCC)   CHF (congestive heart failure) (HCC)   Acute renal failure superimposed on stage 4 chronic kidney disease (HCC)   Normocytic anemia   Hyponatremia  #1 left great toe osteomyelitis/septic arthritis -Patient with history of poorly controlled insulin-dependent type 2 diabetes, CKD stage IV. -Patient presented with concern from podiatrist office for left great toe infection status post recent excisional debridement  of wound on the left great toe. -Plain films of the left foot concerning for probable osteomyelitis involving the left great toe distal phalanx.  Patient noted to have elevated sed rate and CRP. -MRI left foot with concerns for osteomyelitis, small joint effusion of the interphalangeal joint of the first digit concerning for septic arthritis. -Continue empiric IV antibiotics of cefepime, Flagyl, vancomycin. -Patient seen in consultation by podiatry and patient scheduled for left great toe amputation tomorrow 03/21/2022. -Per podiatry.  2.  AKI on CKD stage IV -Unknown baseline however creatinine noted about a year ago on 01/29/2021 noted at 2.98, creatinine 02/17/2021 noted on epic at 2.98 as well. -Patient presented with a creatinine of 4.11 likely secondary to a prerenal azotemia in the setting of diuretics of Demadex, spironolactone. -Urinalysis done this morning with protein of 30, glycosuria. -Urine sodium was 75, urine creatinine of 93. -Renal ultrasound with no significant sonographic abnormality of the kidneys.  No mass or hydronephrosis noted. -Renal function improving slowly with gentle hydration and holding diuretics. -Patient noted to be followed by Dr. Dimas Aguas, nephrology [place]. -Increase IV fluids to 100 cc an hour. -Outpatient follow-up with nephrology.  3.  Uncontrolled type 2 diabetes mellitus -Hemoglobin A1c noted at 8.3. -Patient noted to be on insulin, Jardiance prior to admission. -CBG 120 this morning. -SSI.  4.  Hypertension -Controlled on current regimen of Coreg, hydralazine. -Continue to hold Demadex, spironolactone. -Outpatient follow-up.  5.  Hyperlipidemia -Statin.  6.  History of chronic combined systolic/diastolic CHF/CAD -Patient with no symptoms, euvolemic on examination. -Last 2D echo with EF of 40 to 81%, grade 1 diastolic dysfunction. -Diuretics  of torsemide spironolactone on hold due to AKI currently stable. -Gentle hydration. -Continue  aspirin, statin, beta-blocker, hydralazine.  7.  GERD -PPI  8.  COPD -Stable. -Bronchodilators as needed.  9.  Normocytic anemia/anemia of chronic disease -H&H stable at 10.3. -Repeat labs this a.m.   DVT prophylaxis: SCDs Code Status: Full Family Communication: Updated patient and wife at bedside. Disposition: Likely home once cleared by podiatry.  Status is: Inpatient Remains inpatient appropriate because: Severity of illness   Consultants:  Podiatry: Dr. Boneta Lucks 03/19/2022  Procedures:  Plain films of the left foot 03/18/2022 MRI of the left foot 03/18/2022 Renal ultrasound 03/18/2022   Antimicrobials:  IV cefepime 03/19/2022>>>>> IV Flagyl 03/19/2022>>>>> IV vancomycin 03/18/2022 >>>>.   Subjective: Laying in bed.  Wife at bedside.  Denies any chest pain.  No shortness of breath.  No abdominal pain.  Objective: Vitals:   03/19/22 1048 03/19/22 1948 03/20/22 0419 03/20/22 0931  BP: 139/67 (!) 149/52 137/70 (!) 150/73  Pulse: 77 84 77 74  Resp: 20 19 18 15   Temp: 98.3 F (36.8 C) 98.1 F (36.7 C) 97.9 F (36.6 C) 97.8 F (36.6 C)  TempSrc:   Oral Oral  SpO2: 98% 98% 96% 98%  Weight:      Height:        Intake/Output Summary (Last 24 hours) at 03/20/2022 1212 Last data filed at 03/20/2022 1030 Gross per 24 hour  Intake 0.81 ml  Output 1300 ml  Net -1299.19 ml   Filed Weights   03/18/22 1807  Weight: 98.2 kg    Examination:  General exam: Appears calm and comfortable  Respiratory system: Clear to auscultation. Respiratory effort normal. Cardiovascular system: S1 & S2 heard, RRR. No JVD, murmurs, rubs, gallops or clicks. No pedal edema. Gastrointestinal system: Abdomen is nondistended, soft and nontender. No organomegaly or masses felt. Normal bowel sounds heard. Central nervous system: Alert and oriented. No focal neurological deficits. Extremities: Left great toe wrapped.  Symmetric 5 x 5 power. Skin: No rashes, lesions or ulcers Psychiatry: Judgement  and insight appear normal. Mood & affect appropriate.     Data Reviewed: I have personally reviewed following labs and imaging studies  CBC: Recent Labs  Lab 03/18/22 1826 03/19/22 0439  WBC 10.1 8.9  NEUTROABS 7.6  --   HGB 10.6* 10.3*  HCT 33.2* 32.6*  MCV 90.2 89.6  PLT 354 678    Basic Metabolic Panel: Recent Labs  Lab 03/18/22 1826 03/18/22 2156 03/19/22 0439 03/20/22 0516  NA 134*  --  138 138  K 5.2*  --  4.8 4.6  CL 99  --  103 107  CO2 23  --  24 22  GLUCOSE 236*  --  93 119*  BUN 66*  --  64* 70*  CREATININE 4.11*  --  3.93* 3.41*  CALCIUM 8.9  --  9.1 9.2  MG  --  2.6*  --   --     GFR: Estimated Creatinine Clearance: 26.5 mL/min (A) (by C-G formula based on SCr of 3.41 mg/dL (H)).  Liver Function Tests: No results for input(s): "AST", "ALT", "ALKPHOS", "BILITOT", "PROT", "ALBUMIN" in the last 168 hours.  CBG: Recent Labs  Lab 03/19/22 1944 03/20/22 0026 03/20/22 0416 03/20/22 0742 03/20/22 1114  GLUCAP 166* 129* 120* 120* 127*     Recent Results (from the past 240 hour(s))  Blood culture (routine x 2)     Status: None (Preliminary result)   Collection Time: 03/18/22  8:18 PM  Specimen: Left Antecubital; Blood  Result Value Ref Range Status   Specimen Description   Final    LEFT ANTECUBITAL Performed at [facility] [address], [place], [place] [address]    Special Requests   Final    BOTTLES DRAWN AEROBIC AND ANAEROBIC Blood Culture adequate volume Performed at [facility] [address], [place], [place] [address]    Culture   Final    NO GROWTH 2 DAYS Performed at [facility] [address], [place], [place] [address]    Report Status PENDING  Incomplete  Blood culture (routine x 2)     Status: None (Preliminary result)   Collection Time: 03/18/22  9:58 PM   Specimen: BLOOD LEFT HAND  Result Value Ref Range Status   Specimen Description   Final    BLOOD LEFT  HAND Performed at [facility] [address], [place], [place] [address]    Special Requests   Final    BOTTLES DRAWN AEROBIC AND ANAEROBIC Blood Culture adequate volume Performed at [facility] [address] [place], [place] [address]    Culture   Final    NO GROWTH 2 DAYS Performed at [facility] [address] [place], [place] [address]    Report Status PENDING  Incomplete         Radiology Studies: US RENAL  Result Date: 03/18/2022 CLINICAL DATA:  Acute kidney injury EXAM: RENAL / URINARY TRACT ULTRASOUND COMPLETE COMPARISON:  None Available. FINDINGS: Right Kidney: Renal measurements: 9.3 x 4.6 x 4.9 cm = volume: 108 mL. Echogenicity within normal limits. No mass or hydronephrosis visualized. Left Kidney: Renal measurements: 10.0 x 5.6 x 5.6 cm = volume: 163 mL. Echogenicity within normal limits. No mass or hydronephrosis visualized. Bladder: Appears normal for degree of bladder distention. Other: None. IMPRESSION: No significant sonographic abnormality of the kidneys. Electronically Signed   By: Ronney Asters M.D.   On: 03/18/2022 23:42   MR FOOT LEFT WO CONTRAST  Result Date: 03/18/2022 CLINICAL DATA:  Left great toe wound infection, concern for osteomyelitis EXAM: MRI OF THE LEFT FOOT WITHOUT CONTRAST TECHNIQUE: Multiplanar, multisequence MR imaging of the left forefoot was performed. No intravenous contrast was administered. COMPARISON:  Radiograph performed earlier on the same date FINDINGS: Bones/Joint/Cartilage There is bone marrow edema of the distal phalanx of the first digit. There is also small joint effusion in the interphalangeal joint of the first digit. Marrow signal within remaining osseous structures is within normal limits Ligaments Lisfranc and collateral ligaments appear intact. 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  Soft tissues Marked subcutaneous soft tissue edema about the foot prominent about the dorsum of the foot. Deep skin wound about the dorsal aspect of the first distal phalanx with surrounding edema and inflammatory changes. No fluid collection or abscess IMPRESSION: 1. Bone marrow edema of the distal phalanx of the first digit concerning for osteomyelitis. 2. Small joint effusion of the interphalangeal joint of the first digit concerning for septic arthritis. 3. Soft tissue wound about the dorsal aspect of the first distal phalanx with surrounding edema and inflammatory changes. No fluid collection or abscess. 4. 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 Electronically Signed   By: Keane Police D.O.   On: 03/18/2022 22:48        Scheduled Meds:  aspirin EC  81 mg Oral  Daily   atorvastatin  80 mg Oral Daily   carvedilol  3.125 mg Oral BID   hydrALAZINE  25 mg Oral TID   insulin aspart  0-9 Units Subcutaneous Q4H   multivitamin with minerals  1 tablet Oral Daily   nutrition supplement (JUVEN)  1 packet Oral BID WC   Ensure Max Protein  11 oz Oral BID   Continuous Infusions:  sodium chloride 75 mL/hr at 03/19/22 1304   ceFEPime (MAXIPIME) IV 2 g (03/20/22 0604)   metronidazole 500 mg (03/20/22 0717)   vancomycin       LOS: 2 days    Time spent: 35 minutes    Irine Seal, MD [facility]   To contact the attending provider between 7A-7P or the covering provider during after hours 7P-7A, please log into the web site [email or web address] and access using universal [place] password for that web site. If you do not have the password, please call the hospital operator.  03/20/2022, 12:12 PM

## 2022-03-20 NOTE — Progress Notes (Signed)
Pharmacy Antibiotic Note  Roberto Allen is a 62 y.o. male admitted on 03/18/2022 with  wound infection .  Pharmacy has been consulted for Cefepime + Vancomycin dosing.  MRI Left foot + osteomyelitis of distal phalanx of 1st digit & septic arthritis of interphalangeal joint of 1st digit.  2/1 Noted chronic kidney disease.  Scr 4.11 today (previous known Scr ~ 3 in 2022/early 2023) 2/3 SCr down 3.41  Plan: Continue Cefepime 2gm IV q24h Continue Metronidazole 500mg  IV q12h Change Vancomycin to 1250 mg IV q48h to target AUC 400-550.  Estimated AUC 530.4. Monitor clinical progress, renal function, vancomycin levels as indicated   Height: 5\' 11"  (180.3 cm) Weight: 98.2 kg (216 lb 6.4 oz) IBW/kg (Calculated) : 75.3  Temp (24hrs), Avg:97.9 F (36.6 C), Min:97.8 F (36.6 C), Max:98.1 F (36.7 C)  Recent Labs  Lab 03/18/22 1826 03/19/22 0439 03/20/22 0516  WBC 10.1 8.9  --   CREATININE 4.11* 3.93* 3.41*     Estimated Creatinine Clearance: 26.5 mL/min (A) (by C-G formula based on SCr of 3.41 mg/dL (H)).    Allergies  Allergen Reactions   Metformin And Related Other (See Comments)    Due to CHF and CRI    Antimicrobials this admission: 2/2 Cefepime >>  2/2 Metronidazole >> 2/1 Vancomycin >>  2/1 Zosyn x1 in ED  Dose adjustments this admission:  Microbiology results: 2/1 BCx: NGTD  Thank you for allowing pharmacy to be a part of this patient's care.  Suzzanne Cloud PharmD, BCPS 03/20/2022 12:37 PM

## 2022-03-21 ENCOUNTER — Inpatient Hospital Stay (HOSPITAL_COMMUNITY): Payer: 59 | Admitting: Anesthesiology

## 2022-03-21 ENCOUNTER — Encounter (HOSPITAL_COMMUNITY): Payer: Self-pay | Admitting: Internal Medicine

## 2022-03-21 ENCOUNTER — Inpatient Hospital Stay (HOSPITAL_COMMUNITY): Payer: 59

## 2022-03-21 ENCOUNTER — Other Ambulatory Visit: Payer: Self-pay

## 2022-03-21 ENCOUNTER — Encounter (HOSPITAL_COMMUNITY)
Admission: EM | Disposition: A | Discharge status: Home / Self Care | Payer: Self-pay | Source: Home / Self Care | Attending: Internal Medicine

## 2022-03-21 DIAGNOSIS — Z7984 Long term (current) use of oral hypoglycemic drugs: Secondary | ICD-10-CM

## 2022-03-21 DIAGNOSIS — Z794 Long term (current) use of insulin: Secondary | ICD-10-CM

## 2022-03-21 DIAGNOSIS — M869 Osteomyelitis, unspecified: Secondary | ICD-10-CM | POA: Diagnosis not present

## 2022-03-21 DIAGNOSIS — E1165 Type 2 diabetes mellitus with hyperglycemia: Secondary | ICD-10-CM

## 2022-03-21 DIAGNOSIS — E871 Hypo-osmolality and hyponatremia: Secondary | ICD-10-CM | POA: Diagnosis not present

## 2022-03-21 DIAGNOSIS — N179 Acute kidney failure, unspecified: Secondary | ICD-10-CM | POA: Diagnosis not present

## 2022-03-21 DIAGNOSIS — E1169 Type 2 diabetes mellitus with other specified complication: Secondary | ICD-10-CM

## 2022-03-21 HISTORY — PX: AMPUTATION TOE: SHX6595

## 2022-03-21 LAB — CBC WITH DIFFERENTIAL/PLATELET
Abs Immature Granulocytes: 0.03 10*3/uL (ref 0.00–0.07)
Basophils Absolute: 0 10*3/uL (ref 0.0–0.1)
Basophils Relative: 0 %
Eosinophils Absolute: 0.2 10*3/uL (ref 0.0–0.5)
Eosinophils Relative: 2 %
HCT: 35.2 % — ABNORMAL LOW (ref 39.0–52.0)
Hemoglobin: 11 g/dL — ABNORMAL LOW (ref 13.0–17.0)
Immature Granulocytes: 0 %
Lymphocytes Relative: 18 %
Lymphs Abs: 1.7 10*3/uL (ref 0.7–4.0)
MCH: 28.5 pg (ref 26.0–34.0)
MCHC: 31.3 g/dL (ref 30.0–36.0)
MCV: 91.2 fL (ref 80.0–100.0)
Monocytes Absolute: 0.6 10*3/uL (ref 0.1–1.0)
Monocytes Relative: 7 %
Neutro Abs: 6.8 10*3/uL (ref 1.7–7.7)
Neutrophils Relative %: 73 %
Platelets: 376 10*3/uL (ref 150–400)
RBC: 3.86 MIL/uL — ABNORMAL LOW (ref 4.22–5.81)
RDW: 15.4 % (ref 11.5–15.5)
WBC: 9.3 10*3/uL (ref 4.0–10.5)
nRBC: 0 % (ref 0.0–0.2)

## 2022-03-21 LAB — GLUCOSE, CAPILLARY
Glucose-Capillary: 101 mg/dL — ABNORMAL HIGH (ref 70–99)
Glucose-Capillary: 105 mg/dL — ABNORMAL HIGH (ref 70–99)
Glucose-Capillary: 113 mg/dL — ABNORMAL HIGH (ref 70–99)
Glucose-Capillary: 143 mg/dL — ABNORMAL HIGH (ref 70–99)
Glucose-Capillary: 155 mg/dL — ABNORMAL HIGH (ref 70–99)
Glucose-Capillary: 89 mg/dL (ref 70–99)

## 2022-03-21 LAB — RENAL FUNCTION PANEL
Albumin: 3.1 g/dL — ABNORMAL LOW (ref 3.5–5.0)
Anion gap: 13 (ref 5–15)
BUN: 60 mg/dL — ABNORMAL HIGH (ref 8–23)
CO2: 20 mmol/L — ABNORMAL LOW (ref 22–32)
Calcium: 9.1 mg/dL (ref 8.9–10.3)
Chloride: 109 mmol/L (ref 98–111)
Creatinine, Ser: 2.79 mg/dL — ABNORMAL HIGH (ref 0.61–1.24)
GFR, Estimated: 25 mL/min — ABNORMAL LOW (ref >60)
Glucose, Bld: 103 mg/dL — ABNORMAL HIGH (ref 70–99)
Phosphorus: 3.8 mg/dL (ref 2.5–4.6)
Potassium: 4.7 mmol/L (ref 3.5–5.1)
Sodium: 142 mmol/L (ref 135–145)

## 2022-03-21 SURGERY — AMPUTATION, TOE
Anesthesia: Monitor Anesthesia Care | Site: Toe | Laterality: Left

## 2022-03-21 MED ORDER — PROPOFOL 10 MG/ML IV BOLUS
INTRAVENOUS | Status: DC | PRN
  Administered 2022-03-21: 30 mg via INTRAVENOUS

## 2022-03-21 MED ORDER — 0.9 % SODIUM CHLORIDE (POUR BTL) OPTIME
TOPICAL | Status: DC | PRN
  Administered 2022-03-21: 1000 mL

## 2022-03-21 MED ORDER — MIDAZOLAM HCL 2 MG/2ML IJ SOLN
INTRAMUSCULAR | Status: AC
  Filled 2022-03-21: qty 2

## 2022-03-21 MED ORDER — BUPIVACAINE HCL (PF) 0.5 % IJ SOLN
INTRAMUSCULAR | Status: DC | PRN
  Administered 2022-03-21: 10 mL

## 2022-03-21 MED ORDER — PROMETHAZINE HCL 25 MG/ML IJ SOLN: 6.2500 mg | INTRAMUSCULAR | Status: DC | PRN

## 2022-03-21 MED ORDER — PROPOFOL 500 MG/50ML IV EMUL
INTRAVENOUS | Status: DC | PRN
  Administered 2022-03-21: 100 ug/kg/min via INTRAVENOUS

## 2022-03-21 MED ORDER — LIDOCAINE HCL (PF) 2 % IJ SOLN
INTRAMUSCULAR | Status: AC
  Filled 2022-03-21: qty 5

## 2022-03-21 MED ORDER — BUPIVACAINE HCL (PF) 0.5 % IJ SOLN
INTRAMUSCULAR | Status: AC
  Filled 2022-03-21: qty 30

## 2022-03-21 MED ORDER — MIDAZOLAM HCL 2 MG/2ML IJ SOLN
INTRAMUSCULAR | Status: DC | PRN
  Administered 2022-03-21: 2 mg via INTRAVENOUS

## 2022-03-21 MED ORDER — OXYCODONE HCL 5 MG PO TABS
5.0000 mg | ORAL_TABLET | Freq: Four times a day (QID) | ORAL | Status: DC | PRN
  Administered 2022-03-21: 5 mg via ORAL
  Filled 2022-03-21: qty 1

## 2022-03-21 MED ORDER — LIDOCAINE 2% (20 MG/ML) 5 ML SYRINGE
INTRAMUSCULAR | Status: DC | PRN
  Administered 2022-03-21: 60 mg via INTRAVENOUS

## 2022-03-21 MED ORDER — OXYCODONE HCL 5 MG/5ML PO SOLN: 5.0000 mg | Freq: Once | ORAL | Status: DC | PRN

## 2022-03-21 MED ORDER — FENTANYL CITRATE PF 50 MCG/ML IJ SOSY: 25.0000 ug | PREFILLED_SYRINGE | INTRAMUSCULAR | Status: DC | PRN

## 2022-03-21 MED ORDER — PROPOFOL 10 MG/ML IV BOLUS
INTRAVENOUS | Status: AC
  Filled 2022-03-21: qty 20

## 2022-03-21 MED ORDER — ONDANSETRON HCL 4 MG/2ML IJ SOLN
INTRAMUSCULAR | Status: DC | PRN
  Administered 2022-03-21: 4 mg via INTRAVENOUS

## 2022-03-21 MED ORDER — LACTATED RINGERS IV SOLN: INTRAVENOUS | Status: DC | PRN

## 2022-03-21 MED ORDER — OXYCODONE HCL 5 MG PO TABS: 5.0000 mg | ORAL_TABLET | Freq: Once | ORAL | Status: DC | PRN

## 2022-03-21 MED ORDER — ONDANSETRON HCL 4 MG/2ML IJ SOLN
INTRAMUSCULAR | Status: AC
  Filled 2022-03-21: qty 2

## 2022-03-21 MED ORDER — HYDRALAZINE HCL 50 MG PO TABS
50.0000 mg | ORAL_TABLET | Freq: Three times a day (TID) | ORAL | Status: DC
  Administered 2022-03-21 (×2): 50 mg via ORAL
  Filled 2022-03-21 (×2): qty 1

## 2022-03-21 MED ORDER — FENTANYL CITRATE (PF) 100 MCG/2ML IJ SOLN
INTRAMUSCULAR | Status: AC
  Filled 2022-03-21: qty 2

## 2022-03-21 MED ORDER — DEXAMETHASONE SODIUM PHOSPHATE 10 MG/ML IJ SOLN
INTRAMUSCULAR | Status: AC
  Filled 2022-03-21: qty 1

## 2022-03-21 SURGICAL SUPPLY — 38 items
APL PRP STRL LF DISP 70% ISPRP (MISCELLANEOUS) ×1
BLADE SAW SGTL 81X20 HD (BLADE) IMPLANT
BLADE SURG 15 STRL LF DISP TIS (BLADE) ×2 IMPLANT
BLADE SURG 15 STRL SS (BLADE) ×2
BNDG ELASTIC 4X5.8 VLCR STR LF (GAUZE/BANDAGES/DRESSINGS) ×1 IMPLANT
BNDG GAUZE DERMACEA FLUFF 4 (GAUZE/BANDAGES/DRESSINGS) ×2 IMPLANT
BNDG GZE DERMACEA 4 6PLY (GAUZE/BANDAGES/DRESSINGS) ×1
CHLORAPREP W/TINT 26 (MISCELLANEOUS) ×1 IMPLANT
COVER BACK TABLE 60X90IN (DRAPES) ×1 IMPLANT
CUFF TOURN SGL QUICK 24 (TOURNIQUET CUFF) ×1
CUFF TRNQT CYL 24X4X16.5-23 (TOURNIQUET CUFF) IMPLANT
DRAPE EXTREMITY T 121X128X90 (DISPOSABLE) ×1 IMPLANT
DRAPE IMP U-DRAPE 54X76 (DRAPES) ×1 IMPLANT
DRAPE U-SHAPE 47X51 STRL (DRAPES) ×1 IMPLANT
DRSG EMULSION OIL 3X3 NADH (GAUZE/BANDAGES/DRESSINGS) ×1 IMPLANT
ELECT REM PT RETURN 15FT ADLT (MISCELLANEOUS) ×1 IMPLANT
GAUZE 4X4 16PLY ~~LOC~~+RFID DBL (SPONGE) IMPLANT
GAUZE SPONGE 4X4 12PLY STRL (GAUZE/BANDAGES/DRESSINGS) ×1 IMPLANT
GAUZE XEROFORM 1X8 LF (GAUZE/BANDAGES/DRESSINGS) IMPLANT
GLOVE BIO SURGEON STRL SZ7 (GLOVE) ×1 IMPLANT
GLOVE BIO SURGEON STRL SZ8 (GLOVE) IMPLANT
GLOVE BIOGEL PI IND STRL 7.0 (GLOVE) IMPLANT
GLOVE BIOGEL PI IND STRL 7.5 (GLOVE) ×1 IMPLANT
GLOVE BIOGEL PI IND STRL 8 (GLOVE) IMPLANT
GOWN STRL REUS W/ TWL LRG LVL3 (GOWN DISPOSABLE) ×1 IMPLANT
GOWN STRL REUS W/ TWL XL LVL3 (GOWN DISPOSABLE) ×1 IMPLANT
GOWN STRL REUS W/TWL LRG LVL3 (GOWN DISPOSABLE) ×1
GOWN STRL REUS W/TWL XL LVL3 (GOWN DISPOSABLE) ×1
KIT BASIN OR (CUSTOM PROCEDURE TRAY) ×1 IMPLANT
KIT TURNOVER KIT A (KITS) IMPLANT
NDL HYPO 25X1 1.5 SAFETY (NEEDLE) ×1 IMPLANT
NDL SAFETY ECLIP 18X1.5 (MISCELLANEOUS) IMPLANT
NEEDLE HYPO 25X1 1.5 SAFETY (NEEDLE) ×1 IMPLANT
PENCIL SMOKE EVACUATOR (MISCELLANEOUS) ×1 IMPLANT
SUT PROLENE 2 0 CT2 30 (SUTURE) IMPLANT
SYR BULB EAR ULCER 3OZ GRN STR (SYRINGE) ×1 IMPLANT
SYR CONTROL 10ML LL (SYRINGE) IMPLANT
UNDERPAD 30X36 HEAVY ABSORB (UNDERPADS AND DIAPERS) ×1 IMPLANT

## 2022-03-21 NOTE — Op Note (Signed)
Surgeon: Surgeon(s): [name], DPM  Assistants: None Pre-operative diagnosis: OSTEOMYELITIS  Post-operative diagnosis: same Procedure: Procedure(s) (LRB): AMPUTATION TOE (Left)  Pathology: Right great toe Pertinent Intra-op findings: Osteomyelitis noted of the distal phalanx left Anesthesia: Choice  Hemostasis: * Missing tourniquet times found for documented tourniquets in log: [id number] * EBL: 15 cc Materials: 2-0 Prolene Injectables: 10 cc of half percent Marcaine plain Complications: None  Indications for surgery: A 62 y.o. male presents with left hallux osteomyelitis. Patient has failed all conservative therapy including but not limited to local wound care and antibiotics. He wishes to have surgical correction of the foot/deformity. It was determined that patient would benefit from left great toe amputation. Informed surgical risk consent was reviewed and read aloud to the patient.  I reviewed the films.  I have discussed my findings with the patient in great detail.  I have discussed all risks including but not limited to infection, stiffness, scarring, limp, disability, deformity, damage to blood vessels and nerves, numbness, poor healing, need for braces, arthritis, chronic pain, amputation, death.  All benefits and realistic expectations discussed in great detail.  I have made no promises as to the outcome.  I have provided realistic expectations.  I have offered the patient a 2nd opinion, which they have declined and assured me they preferred to proceed despite the risks   Procedure in detail: The patient was both verbally and visually identified by myself, the nursing staff, and anesthesia staff in the preoperative holding area. They were then transferred to the operating room and placed on the operative table in supine position.  Attention was directed to the left great toe.  Using skin marker.  The mouth incision was delineated.  Using #15 blade incision was carried down from  epidermal dermal junction down to the level of the bone.  At this time a sagittal saw was used to take the head of the proximal phalanx which appeared to be hard indurated bone.  The toe was detached and sent to pathology in standard technique the wound was thoroughly irrigated with normal saline solution.  No signs of infection noted.  The wound was primarily closed with 2-0 Prolene.  Incision was dressed with Xeroform Curlex ABD pad 4 x 4 gauze.  All bony prominences were adequately padded.  Disposition Patient is okay to be discharged from podiatric standpoint.  10 days of doxycycline.  No dressing change until follow-up follow-up 1 week from discharge.  At the conclusion of the procedure the patient was awoken from anesthesia and found to have tolerated the procedure well any complications. There were transferred to PACU with vital signs stable and vascular status intact.  Boneta Lucks, DPM

## 2022-03-21 NOTE — Transfer of Care (Signed)
Immediate Anesthesia Transfer of Care Note  Patient: Roberto Allen  Procedure(s) Performed: AMPUTATION TOE (Left: Toe)  Patient Location: PACU  Anesthesia Type:MAC  Level of Consciousness: awake, oriented, and patient cooperative  Airway & Oxygen Therapy: Patient Spontanous Breathing and Patient connected to face mask oxygen  Post-op Assessment: Report given to RN and Post -op Vital signs reviewed and stable  Post vital signs: Reviewed  Last Vitals:  Vitals Value Taken Time  BP 141/78 03/21/22 1045  Temp    Pulse 81 03/21/22 1047  Resp 24 03/21/22 1047  SpO2 98 % 03/21/22 1047  Vitals shown include unvalidated device data.  Last Pain:  Vitals:   03/21/22 0715  TempSrc:   PainSc: 0-No pain         Complications: No notable events documented.

## 2022-03-21 NOTE — Progress Notes (Signed)
PROGRESS NOTE    Roberto Allen  LFY:[id number] DOB: 23-May-1960 DOA: 03/18/2022 PCP: [name], MD    Chief Complaint  Patient presents with   Left toe infection    Brief Narrative:  Patient is a 62 year old male with history of coronary artery  disease, CHF, CKD stage IV, COPD, insulin-dependent diabetes type 2, GERD, hypertension, hyperlipidemia who was sent by his podiatrist for the concern of left great toe infection.  He recently had excisional debridement of the wound on the left great toe by his podiatrist.  X-ray done at office was concerning for possible osteomyelitis involving the left great toe distal phalanx.  Sent to the emergency room for IV antibiotics, surgical intervention.  On presentation he was afebrile, hemodynamically stable.  Lab work did not show leukocytosis but showed potassium of 5.2, creatinine of 4.1.  Elevated CRP, ESR.  Patient was started on broad spectrum antibiotics.  MRI showed  bone marrow edema of the distal phalanx of the first digit concerning for osteomyelitis,small joint effusion of the interphalangeal joint of the first digit concerning for septic arthritis.  Podiatry planning for left amputation on Sunday    Assessment & Plan:   Principal Problem:   Osteomyelitis of great toe of left foot (HCC) Active Problems:   Hypertension   Coronary Artery Disease (non-obstructive by cath 05/2011)   Hyperlipidemia associated with type 2 diabetes mellitus (HCC)   Hyperkalemia   DM (diabetes mellitus), type 2 with renal complications (HCC)   CHF (congestive heart failure) (HCC)   Acute renal failure superimposed on stage 4 chronic kidney disease (HCC)   Normocytic anemia   Hyponatremia  #1 left great toe osteomyelitis/septic arthritis -Patient with history of poorly controlled insulin-dependent type 2 diabetes, CKD stage IV. -Patient presented with concern from podiatrist office for left great toe infection status post recent excisional debridement  of wound on the left great toe. -Plain films of the left foot concerning for probable osteomyelitis involving the left great toe distal phalanx.  Patient noted to have elevated sed rate and CRP. -MRI left foot with concerns for osteomyelitis, small joint effusion of the interphalangeal joint of the first digit concerning for septic arthritis. -Continue empiric IV antibiotics of cefepime, Flagyl, vancomycin. -Patient seen in consultation by podiatry and patient scheduled for left great toe amputation today, 03/21/2022. -Pending surgical procedure and whether margins are clean we will determine duration of antibiotics. -May need ID input postoperatively. -Per podiatry.  2.  AKI on CKD stage IV -Unknown baseline however creatinine noted about a year ago on 01/29/2021 noted at 2.98, creatinine 02/17/2021 noted on epic at 2.98 as well. -Patient presented with a creatinine of 4.11 likely secondary to a prerenal azotemia in the setting of diuretics of Demadex, spironolactone. -Urinalysis done with protein of 30, glycosuria. -Urine sodium was 75, urine creatinine of 93. -Renal ultrasound with no significant sonographic abnormality of the kidneys.  No mass or hydronephrosis noted. -Renal function improving slowly with gentle hydration and holding diuretics. -Creatinine currently at 2.79 today from 4.11 on admission. -Patient noted to be followed by Dr. Dimas Aguas, nephrology [place]. -Decrease IV fluid rate to 75 cc an hour.  -Outpatient follow-up with nephrology.  3.  Uncontrolled type 2 diabetes mellitus -Hemoglobin A1c noted at 8.3. -Patient noted to be on insulin, Jardiance prior to admission. -CBG 89 this morning. -Patient currently n.p.o. in anticipation of surgical procedure today. -SSI.  4.  Hypertension -Continue Coreg.  Increase hydralazine to 50 mg 3 times daily. -Continue  to hold Demadex, spironolactone. -Outpatient follow-up.  5.  Hyperlipidemia -Statin.  6.  History of chronic  combined systolic/diastolic CHF/CAD -Patient with no symptoms, euvolemic on examination. -Last 2D echo with EF of 40 to 63%, grade 1 diastolic dysfunction. -Diuretics of torsemide spironolactone on hold due to AKI currently stable. -Continue gentle hydration and monitor fluid status.   -Continue aspirin, statin, beta-blocker, hydralazine.  7.  GERD -Continue PPI.   8.  COPD -Stable. -Bronchodilators as needed.  9.  Normocytic anemia/anemia of chronic disease -H&H stable at 11.0. -Repeat labs this a.m.   DVT prophylaxis: SCDs Code Status: Full Family Communication: Updated patient and wife at bedside. Disposition: Likely home once cleared by podiatry.  Status is: Inpatient Remains inpatient appropriate because: Severity of illness   Consultants:  Podiatry: Dr. Boneta Lucks 03/19/2022  Procedures:  Plain films of the left foot 03/18/2022 MRI of the left foot 03/18/2022 Renal ultrasound 03/18/2022   Antimicrobials:  IV cefepime 03/19/2022>>>>> IV Flagyl 03/19/2022>>>>> IV vancomycin 03/18/2022 >>>>.   Subjective: Laying in bed, listening/watching church service on his phone.  Wife at bedside.  Denies any chest pain, no shortness of breath, no abdominal pain.  Awaiting surgery this morning.    Objective: Vitals:   03/20/22 0931 03/20/22 1354 03/20/22 2131 03/21/22 0538  BP: (!) 150/73 139/79 (!) 158/65 (!) 157/81  Pulse: 74 74 79 78  Resp: 15 17 16 16   Temp: 97.8 F (36.6 C) 97.9 F (36.6 C) 98 F (36.7 C) 98.2 F (36.8 C)  TempSrc: Oral Oral Oral Oral  SpO2: 98% 97% 99% 97%  Weight:      Height:        Intake/Output Summary (Last 24 hours) at 03/21/2022 0911 Last data filed at 03/21/2022 0500 Gross per 24 hour  Intake 1723.33 ml  Output 1050 ml  Net 673.33 ml    Filed Weights   03/18/22 1807  Weight: 98.2 kg    Examination:  General exam: NAD Respiratory system: CTAB.  No wheezes, no crackles, no rhonchi.  Fair air movement.  Speaking in full sentences.    Cardiovascular system: Regular rate rhythm no murmurs rubs or gallops.  No JVD.  No lower extremity edema.  Gastrointestinal system: Abdomen is soft, nontender, nondistended, positive bowel sounds.  No rebound.  No guarding.  Central nervous system: Alert and oriented. No focal neurological deficits. Extremities: Left great toe wrapped.  Symmetric 5 x 5 power. Skin: No rashes, lesions or ulcers Psychiatry: Judgement and insight appear normal. Mood & affect appropriate.     Data Reviewed: I have personally reviewed following labs and imaging studies  CBC: Recent Labs  Lab 03/18/22 1826 03/19/22 0439 03/21/22 0519  WBC 10.1 8.9 9.3  NEUTROABS 7.6  --  6.8  HGB 10.6* 10.3* 11.0*  HCT 33.2* 32.6* 35.2*  MCV 90.2 89.6 91.2  PLT 354 335 376     Basic Metabolic Panel: Recent Labs  Lab 03/18/22 1826 03/18/22 2156 03/19/22 0439 03/20/22 0516 03/21/22 0519  NA 134*  --  138 138 142  K 5.2*  --  4.8 4.6 4.7  CL 99  --  103 107 109  CO2 23  --  24 22 20*  GLUCOSE 236*  --  93 119* 103*  BUN 66*  --  64* 70* 60*  CREATININE 4.11*  --  3.93* 3.41* 2.79*  CALCIUM 8.9  --  9.1 9.2 9.1  MG  --  2.6*  --   --   --  PHOS  --   --   --   --  3.8     GFR: Estimated Creatinine Clearance: 32.4 mL/min (A) (by C-G formula based on SCr of 2.79 mg/dL (H)).  Liver Function Tests: Recent Labs  Lab 03/21/22 0519  ALBUMIN 3.1*    CBG: Recent Labs  Lab 03/20/22 1614 03/20/22 1912 03/21/22 0026 03/21/22 0403 03/21/22 0745  GLUCAP 126* 127* 143* 89 105*      Recent Results (from the past 240 hour(s))  Blood culture (routine x 2)     Status: None (Preliminary result)   Collection Time: 03/18/22  8:18 PM   Specimen: Left Antecubital; Blood  Result Value Ref Range Status   Specimen Description   Final    LEFT ANTECUBITAL Performed at [facility], [place] [address], [place], [place] [address]    Special Requests   Final    BOTTLES DRAWN AEROBIC AND  ANAEROBIC Blood Culture adequate volume Performed at [facility] [address], [place], [place] [address]    Culture   Final    NO GROWTH 3 DAYS Performed at [facility], [place] [address] [place], [place] [address]    Report Status PENDING  Incomplete  Blood culture (routine x 2)     Status: None (Preliminary result)   Collection Time: 03/18/22  9:58 PM   Specimen: BLOOD LEFT HAND  Result Value Ref Range Status   Specimen Description   Final    BLOOD LEFT HAND Performed at [facility] [address] [place], [place] [address]    Special Requests   Final    BOTTLES DRAWN AEROBIC AND ANAEROBIC Blood Culture adequate volume Performed at [facility] [address] [place], [place] [address]    Culture   Final    NO GROWTH 3 DAYS Performed at [facility], [place] [address] [place],  [address]    Report Status PENDING  Incomplete         Radiology Studies: No results found.      Scheduled Meds:  aspirin EC  81 mg Oral Daily   atorvastatin  80 mg Oral Daily   carvedilol  3.125 mg Oral BID   hydrALAZINE  25 mg Oral TID   insulin aspart  0-9 Units Subcutaneous Q4H   multivitamin with minerals  1 tablet Oral Daily   nutrition supplement (JUVEN)  1 packet Oral BID WC   pantoprazole  40 mg Oral Daily   Ensure Max Protein  11 oz Oral BID   Continuous Infusions:  sodium chloride 100 mL/hr at 03/20/22 1816   ceFEPime (MAXIPIME) IV 2 g (03/21/22 6269)   metronidazole 500 mg (03/21/22 0715)   vancomycin 1,250 mg (03/20/22 2230)     LOS: 3 days    Time spent: 35 minutes    Irine Seal, MD [facility]   To contact the attending provider between 7A-7P or the covering provider during after hours 7P-7A, please log into the web site [email or web address] and access using universal [place] password for that web site. If you do not have the password, please call the hospital  operator.  03/21/2022, 9:11 AM

## 2022-03-21 NOTE — Plan of Care (Signed)

## 2022-03-21 NOTE — Anesthesia Procedure Notes (Signed)
Procedure Name: MAC Date/Time: 03/21/2022 10:24 AM  Performed by: Jenne Campus, CRNAPre-anesthesia Checklist: Patient identified, Emergency Drugs available, Suction available and Patient being monitored Oxygen Delivery Method: Simple face mask Placement Confirmation: positive ETCO2

## 2022-03-21 NOTE — Anesthesia Preprocedure Evaluation (Addendum)
Anesthesia Evaluation  Patient identified by MRN, date of birth, ID band Patient awake    Reviewed: Allergy & Precautions, NPO status , Patient's Chart, lab work & pertinent test results  History of Anesthesia Complications Negative for: history of anesthetic complications  Airway Mallampati: III  TM Distance: >3 FB Neck ROM: Full    Dental  (+) Edentulous Upper, Dental Advisory Given   Pulmonary neg shortness of breath, sleep apnea , COPD, neg recent URI, former smoker   Pulmonary exam normal breath sounds clear to auscultation       Cardiovascular hypertension (carvedilol, hydralazine, spironolactone, torsemide), Pt. on home beta blockers (-) angina + CAD and +CHF (EF 40-45%)  (-) Past MI, (-) Cardiac Stents and (-) CABG (-) dysrhythmias  Rhythm:Regular Rate:Normal  HLD  Stress Test 12/18/2020: low-risk  TTE 12/16/2020: IMPRESSIONS     1. Left ventricular ejection fraction, by estimation, is 40 to 45%. The  left ventricle has mildly decreased function. The left ventricle  demonstrates global hypokinesis. There is more severe hypokinesis of the  basal inferior/inferolateral segments.  There is moderate left ventricular hypertrophy. Left ventricular diastolic  parameters are consistent with Grade I diastolic dysfunction (impaired  relaxation).   2. Right ventricular systolic function is normal. The right ventricular  size is mildly enlarged. Tricuspid regurgitation signal is inadequate for  assessing PA pressure.   3. Left atrial size was mildly dilated.   4. Right atrial size was moderately dilated.   5. The mitral valve is normal in structure. Trivial mitral valve  regurgitation. No evidence of mitral stenosis.   6. The aortic valve has an indeterminant number of cusps. Aortic valve  regurgitation is not visualized. No aortic stenosis is present.     Neuro/Psych neg Seizures  Neuromuscular disease (DDD)     GI/Hepatic Neg liver ROS,GERD  ,,  Endo/Other  diabetes (Hgb A1c 8.3), Poorly Controlled, Type 2, Insulin Dependent, Oral Hypoglycemic Agents    Renal/GU CRFRenal disease     Musculoskeletal  (+) Arthritis ,  osteomyelitis   Abdominal   Peds  Hematology  (+) Blood dyscrasia, anemia   Anesthesia Other Findings Last Ozempic: 03/14/2022  Reproductive/Obstetrics                              Anesthesia Physical Anesthesia Plan  ASA: 3  Anesthesia Plan: MAC   Post-op Pain Management:    Induction: Intravenous  PONV Risk Score and Plan: 1 and Propofol infusion and Treatment may vary due to age or medical condition  Airway Management Planned: Natural Airway and Simple Face Mask  Additional Equipment:   Intra-op Plan:   Post-operative Plan:   Informed Consent: I have reviewed the patients History and Physical, chart, labs and discussed the procedure including the risks, benefits and alternatives for the proposed anesthesia with the patient or authorized representative who has indicated his/her understanding and acceptance.     Dental advisory given  Plan Discussed with: CRNA and Anesthesiologist  Anesthesia Plan Comments: (Discussed with patient risks of MAC including, but not limited to, minor pain or discomfort, hearing people in the room, and possible need for backup general anesthesia. Risks for general anesthesia also discussed including, but not limited to, sore throat, hoarse voice, chipped/damaged teeth, injury to vocal cords, nausea and vomiting, allergic reactions, lung infection, heart attack, stroke, and death. All questions answered. )         Anesthesia Quick Evaluation

## 2022-03-21 NOTE — Anesthesia Postprocedure Evaluation (Signed)
Anesthesia Post Note  Patient: Roberto Allen  Procedure(s) Performed: AMPUTATION TOE (Left: Toe)     Patient location during evaluation: PACU Anesthesia Type: MAC Level of consciousness: awake Pain management: pain level controlled Vital Signs Assessment: post-procedure vital signs reviewed and stable Respiratory status: spontaneous breathing, nonlabored ventilation and respiratory function stable Cardiovascular status: stable and blood pressure returned to baseline Postop Assessment: no apparent nausea or vomiting Anesthetic complications: no   No notable events documented.  Last Vitals:  Vitals:   03/21/22 1057 03/21/22 1100  BP: (!) 182/88 (!) 166/85  Pulse: 72 73  Resp: 16 17  Temp:    SpO2: 98% 97%    Last Pain:  Vitals:   03/21/22 1057  TempSrc:   PainSc: 0-No pain                 Nilda Simmer

## 2022-03-21 NOTE — Interval H&P Note (Signed)
History and Physical Interval Note:  03/21/2022 7:28 AM  Roberto Allen  has presented today for surgery, with the diagnosis of OSTEOMYELITIS.  The various methods of treatment have been discussed with the patient and family. After consideration of risks, benefits and other options for treatment, the patient has consented to  Procedure(s): AMPUTATION TOE (Left) as a surgical intervention.  The patient's history has been reviewed, patient examined, no change in status, stable for surgery.  I have reviewed the patient's chart and labs.  Questions were answered to the patient's satisfaction.     [name]

## 2022-03-22 ENCOUNTER — Encounter (HOSPITAL_COMMUNITY): Payer: Self-pay | Admitting: Podiatry

## 2022-03-22 DIAGNOSIS — M86072 Acute hematogenous osteomyelitis, left ankle and foot: Secondary | ICD-10-CM | POA: Diagnosis not present

## 2022-03-22 DIAGNOSIS — E875 Hyperkalemia: Secondary | ICD-10-CM

## 2022-03-22 DIAGNOSIS — M869 Osteomyelitis, unspecified: Secondary | ICD-10-CM | POA: Diagnosis not present

## 2022-03-22 DIAGNOSIS — E1121 Type 2 diabetes mellitus with diabetic nephropathy: Secondary | ICD-10-CM | POA: Diagnosis not present

## 2022-03-22 DIAGNOSIS — N179 Acute kidney failure, unspecified: Secondary | ICD-10-CM | POA: Diagnosis not present

## 2022-03-22 LAB — GLUCOSE, CAPILLARY
Glucose-Capillary: 106 mg/dL — ABNORMAL HIGH (ref 70–99)
Glucose-Capillary: 112 mg/dL — ABNORMAL HIGH (ref 70–99)
Glucose-Capillary: 118 mg/dL — ABNORMAL HIGH (ref 70–99)
Glucose-Capillary: 143 mg/dL — ABNORMAL HIGH (ref 70–99)
Glucose-Capillary: 176 mg/dL — ABNORMAL HIGH (ref 70–99)

## 2022-03-22 LAB — CBC WITH DIFFERENTIAL/PLATELET
Abs Immature Granulocytes: 0.03 10*3/uL (ref 0.00–0.07)
Basophils Absolute: 0.1 10*3/uL (ref 0.0–0.1)
Basophils Relative: 1 %
Eosinophils Absolute: 0.2 10*3/uL (ref 0.0–0.5)
Eosinophils Relative: 2 %
HCT: 34.9 % — ABNORMAL LOW (ref 39.0–52.0)
Hemoglobin: 11 g/dL — ABNORMAL LOW (ref 13.0–17.0)
Immature Granulocytes: 0 %
Lymphocytes Relative: 17 %
Lymphs Abs: 1.7 10*3/uL (ref 0.7–4.0)
MCH: 28.6 pg (ref 26.0–34.0)
MCHC: 31.5 g/dL (ref 30.0–36.0)
MCV: 90.6 fL (ref 80.0–100.0)
Monocytes Absolute: 0.7 10*3/uL (ref 0.1–1.0)
Monocytes Relative: 6 %
Neutro Abs: 7.7 10*3/uL (ref 1.7–7.7)
Neutrophils Relative %: 74 %
Platelets: 374 10*3/uL (ref 150–400)
RBC: 3.85 MIL/uL — ABNORMAL LOW (ref 4.22–5.81)
RDW: 15.4 % (ref 11.5–15.5)
WBC: 10.3 10*3/uL (ref 4.0–10.5)
nRBC: 0 % (ref 0.0–0.2)

## 2022-03-22 LAB — RENAL FUNCTION PANEL
Albumin: 3.1 g/dL — ABNORMAL LOW (ref 3.5–5.0)
Anion gap: 10 (ref 5–15)
BUN: 53 mg/dL — ABNORMAL HIGH (ref 8–23)
CO2: 21 mmol/L — ABNORMAL LOW (ref 22–32)
Calcium: 9.2 mg/dL (ref 8.9–10.3)
Chloride: 108 mmol/L (ref 98–111)
Creatinine, Ser: 2.43 mg/dL — ABNORMAL HIGH (ref 0.61–1.24)
GFR, Estimated: 29 mL/min — ABNORMAL LOW (ref >60)
Glucose, Bld: 118 mg/dL — ABNORMAL HIGH (ref 70–99)
Phosphorus: 3.4 mg/dL (ref 2.5–4.6)
Potassium: 4.5 mmol/L (ref 3.5–5.1)
Sodium: 139 mmol/L (ref 135–145)

## 2022-03-22 LAB — MISC LABCORP TEST (SEND OUT): Labcorp test code: 81950

## 2022-03-22 MED ORDER — TORSEMIDE 20 MG PO TABS: 20.0000 mg | ORAL_TABLET | Freq: Every day | ORAL | 1 refills | Status: AC

## 2022-03-22 MED ORDER — HYDRALAZINE HCL 100 MG PO TABS: 100.0000 mg | ORAL_TABLET | Freq: Three times a day (TID) | ORAL | 1 refills | Status: AC

## 2022-03-22 MED ORDER — CARVEDILOL 12.5 MG PO TABS: 12.5000 mg | ORAL_TABLET | Freq: Two times a day (BID) | ORAL | 1 refills | Status: AC

## 2022-03-22 MED ORDER — OXYCODONE HCL 5 MG PO TABS: 5.0000 mg | ORAL_TABLET | Freq: Four times a day (QID) | ORAL | 0 refills | Status: DC | PRN

## 2022-03-22 MED ORDER — DOXYCYCLINE HYCLATE 100 MG PO TABS: 100.0000 mg | ORAL_TABLET | Freq: Two times a day (BID) | ORAL | 0 refills | Status: AC

## 2022-03-22 MED ORDER — ONDANSETRON HCL 4 MG/2ML IJ SOLN
4.0000 mg | Freq: Four times a day (QID) | INTRAMUSCULAR | Status: DC | PRN
  Administered 2022-03-22: 4 mg via INTRAVENOUS
  Filled 2022-03-22: qty 2

## 2022-03-22 MED ORDER — TOUJEO MAX SOLOSTAR 300 UNIT/ML ~~LOC~~ SOPN: 15.0000 [IU] | PEN_INJECTOR | Freq: Every evening | SUBCUTANEOUS | Status: DC

## 2022-03-22 MED ORDER — HYDRALAZINE HCL 50 MG PO TABS
100.0000 mg | ORAL_TABLET | Freq: Three times a day (TID) | ORAL | Status: DC
  Administered 2022-03-22: 100 mg via ORAL
  Filled 2022-03-22: qty 2

## 2022-03-22 MED ORDER — HUMALOG KWIKPEN 100 UNIT/ML ~~LOC~~ SOPN: 4.0000 [IU] | PEN_INJECTOR | Freq: Three times a day (TID) | SUBCUTANEOUS | 11 refills | Status: AC | PRN

## 2022-03-22 MED ORDER — DOXYCYCLINE HYCLATE 100 MG PO TABS
100.0000 mg | ORAL_TABLET | Freq: Two times a day (BID) | ORAL | Status: DC
  Administered 2022-03-22: 100 mg via ORAL
  Filled 2022-03-22 (×2): qty 1

## 2022-03-22 NOTE — Plan of Care (Signed)

## 2022-03-22 NOTE — Discharge Summary (Signed)
Physician Discharge Summary  Roberto Allen JOA:[id number] DOB: [birth year] DOA: 03/18/2022  PCP: [name], MD  Admit date: 03/18/2022 Discharge date: 03/22/2022  Time spent: 60 minutes  Recommendations for Outpatient Follow-up:  Follow-up with Dr. Boneta Lucks, podiatry in 1 week. Follow-up with Dr. Dimas Aguas, nephrology in 1 to 2 weeks.  On follow-up patient will need a basic metabolic profile done to follow-up on electrolytes and renal function.  Patient's medications will need to be reassessed as patient was noted prior to admission to be taking both furosemide, Demadex, spironolactone.  Medications adjusted on discharge. Follow-up with [name], MD in 2 weeks.  On follow-up patient will need a basic metabolic profile done to follow-up on electrolytes and renal function.  Patient will need a CBC done to follow-up on counts.  Patient's diabetes will need to be reassessed as well as diabetic medications.  Patient's cardiac medications were also need to be followed up upon.  Patient blood pressure need to be reassessed on follow-up.   Discharge Diagnoses:  Principal Problem:   Osteomyelitis of left foot ([place]) Active Problems:   Hypertension   Coronary Artery Disease (non-obstructive by cath 05/2011)   Hyperlipidemia associated with type 2 diabetes mellitus (HCC)   Hyperkalemia   DM (diabetes mellitus), type 2 with renal complications (HCC)   CHF (congestive heart failure) (HCC)   Acute renal failure superimposed on stage 4 chronic kidney disease (HCC)   Normocytic anemia   Hyponatremia   Osteomyelitis of great toe of left foot ([place])   Discharge Condition: Stable and improved.  Diet recommendation: Carb modified diet.  Filed Weights   03/18/22 1807  Weight: 98.2 kg    History of present illness:  HPI per Dr. Encarnacion Chu Allen is a 62 y.o. male with medical history significant of CAD, chronic HFmrEF, CKD stage IV, COPD, insulin-dependent type 2 diabetes, GERD,  hypertension, hyperlipidemia.  He was seen by podiatry today for follow-up of left great toe ulcer and underwent excisional debridement of the wound in the office.  X-ray was concerning for possible osteomyelitis involving the left great toe distal phalanx.  Sent to the ED for IV antibiotics, MRI, and surgical intervention.  In the ED, patient afebrile and vital signs stable.  Labs showing no leukocytosis, hemoglobin 10.6 (previously >12 on labs done in November 2022), MCV 90.2, sodium 134, potassium 5.2, glucose 236, bicarb 23, BUN 66, creatinine 4.1 (was 2.9 on labs done a year ago), ESR and CRP pending, blood cultures drawn.  MRI of left foot pending. Patient was given vancomycin and Zosyn.  ED PA spoke to Dr. Posey Pronto with podiatry, their team will consult in the morning.  [facility] called to admit.   History provided by the patient and his wife at bedside.  He is reporting infection of the great toe of the left foot with increasing foul-smelling drainage for the past 2 weeks.  He was prescribed a 7-day course of doxycycline by his podiatrist which he has almost finished taking but the infection has not improved.  Denies any pain in his foot.  Denies fevers.  Patient states he has chronic kidney disease and is seen by a nephrologist.  He was previously on spironolactone but has not taken it for several months.  States he was advised by his nephrologist to take diuretics for volume overload and he has been taking both furosemide and torsemide which he feels could be affecting his kidney function.  Wife states he was seen by his PCP a few  days ago and told that his kidney disease has progressed to stage V. He denies vomiting, diarrhea, or abdominal pain.  Hospital Course:  #1 left great toe osteomyelitis/septic arthritis -Patient with history of poorly controlled insulin-dependent type 2 diabetes, CKD stage IV. -Patient presented with concern from podiatrist office for left great toe infection status post recent  excisional debridement of wound on the left great toe. -Plain films of the left foot concerning for probable osteomyelitis involving the left great toe distal phalanx.  Patient noted to have elevated sed rate and CRP. -MRI left foot with concerns for osteomyelitis, small joint effusion of the interphalangeal joint of the first digit concerning for septic arthritis. -Patient was maintained empirically on IV antibiotics of cefepime, Flagyl, vancomycin during the hospitalization. -Patient seen in consultation by podiatry and patient underwent left great toe amputation 03/21/2022. -Podiatry recommended 10 days of doxycycline postdischarge with close outpatient follow-up 1 week postdischarge.   -Patient be discharged in stable and improved condition with outpatient follow-up with podiatry in 1 week.   2.  AKI on CKD stage IV -Unknown baseline however creatinine noted about a year ago on 01/29/2021 noted at 2.98, creatinine 02/17/2021 noted on epic at 2.98 as well. -Patient presented with a creatinine of 4.11 likely secondary to a prerenal azotemia in the setting of diuretics of Demadex, furosemide, spironolactone. -Urinalysis done with protein of 30, glycosuria. -Urine sodium was 75, urine creatinine of 93. -Renal ultrasound with no significant sonographic abnormality of the kidneys.  No mass or hydronephrosis noted. -Renal function improved with gentle hydration and holding diuretics.   -Creatinine improved and was down to 2.43 on day of discharge from 4.11 on admission. -Creatinine currently at 2.79 today from 4.11 on admission. -Patient noted to be followed by Dr. Dimas Aguas, nephrology [facility]. -Patient be discharged on a decreased dose of Demadex at 20 mg daily, Coreg 12.5 mg twice daily, spironolactone has been discontinued, furosemide has been discontinued. -Outpatient follow-up with primary nephrologist, Dr. Dimas Aguas in 2 to 3 weeks postdischarge for further management of CKD and reassessment of  medications.   3.  Uncontrolled type 2 diabetes mellitus -Hemoglobin A1c noted at 8.3. -Patient noted to be on insulin, Jardiance prior to admission. -Patient maintained on sliding scale insulin during the hospitalization with control of CBGs.  -Patient be discharged home on a decreased dose of home regimen of toujou at 15 units, Humalog KwikPen 4 units 3 times daily with meals as needed for elevated blood glucose levels.  -Will need outpatient follow-up with PCP for further management of diabetes medications.   4.  Hypertension -Patient placed on Coreg 3.125 mg twice daily during the hospitalization, started on hydralazine and dose uptitrated to home regimen of 100 mg 3 times daily.   -Patient diuretics of Demadex and spironolactone held during the hospitalization and spironolactone will not be resumed on discharge.   -Patient's torsemide will be resumed at a decreased dose of 20 mg daily to start 3 to 4 days postdischarge.   -Outpatient follow-up with PCP.   5.  Hyperlipidemia -Patient maintained on statin.   6.  History of chronic combined systolic/diastolic CHF/CAD -Patient with no symptoms, euvolemic on examination. -Last 2D echo with EF of 40 to 86%, grade 1 diastolic dysfunction. -Diuretics of torsemide spironolactone were held due to AKI during the hospitalization.  -Patient gently hydrated, remained euvolemic.  -Patient maintained on aspirin, statin, beta-blocker, hydralazine.  -Torsemide will be resumed at 20 mg daily 3 to 4 days postdischarge, outpatient follow-up  with PCP and nephrology.    7.  GERD -Patient maintained on PPI.    8.  COPD -Stable. -Patient was maintained on bronchodilators as needed.   9.  Normocytic anemia/anemia of chronic disease -H&H stable at 11.0. -Outpatient follow-up.      Procedures: Plain films of the left foot 03/18/2022 MRI of the left foot 03/18/2022 Renal ultrasound 03/18/2022  Consultations: Podiatry: Dr. Boneta Lucks 03/19/2022    Discharge Exam: Vitals:   03/22/22 0606 03/22/22 1321  BP: (!) 166/81 (!) 117/57  Pulse: 73 71  Resp: 18 17  Temp: 98.3 F (36.8 C) 97.8 F (36.6 C)  SpO2: 98% 99%    General: NAD. Cardiovascular: Regular rate and rhythm no murmurs rubs or gallops.  No JVD.  No lower extremity edema. Respiratory: Clear to auscultation bilaterally.  No wheezes, no crackles, no rhonchi.  Fair air movement.  Discharge Instructions   Discharge Instructions     Diet Carb Modified   Complete by: As directed    Increase activity slowly   Complete by: As directed       Allergies as of 03/22/2022       Reactions   Metformin And Related Other (See Comments)   Due to CHF and CRI        Medication List     STOP taking these medications    furosemide 40 MG tablet Commonly known as: LASIX   silver sulfADIAZINE 1 % cream Commonly known as: Silvadene   spironolactone 25 MG tablet Commonly known as: ALDACTONE       TAKE these medications    Accu-Chek FastClix Lancets Misc USE TO MONITOR BLOOD GLUCOSE 3 TIME(S) DAILY   allopurinol 100 MG tablet Commonly known as: ZYLOPRIM Take 50 mg by mouth daily.   aspirin EC 81 MG tablet Take 1 tablet (81 mg total) by mouth daily.   atorvastatin 80 MG tablet Commonly known as: LIPITOR Take 1 tablet (80 mg total) by mouth daily.   carvedilol 12.5 MG tablet Commonly known as: COREG Take 1 tablet (12.5 mg total) by mouth 2 (two) times daily. What changed: Another medication with the same name was removed. Continue taking this medication, and follow the directions you see here.   doxycycline 100 MG tablet Commonly known as: VIBRA-TABS Take 1 tablet (100 mg total) by mouth every 12 (twelve) hours for 10 days. What changed: when to take this   HumaLOG KwikPen 100 UNIT/ML KwikPen Generic drug: insulin lispro Inject 4 Units into the skin 3 (three) times daily as needed (high blood sugar). What changed: how much to take   hydrALAZINE 100  MG tablet Commonly known as: APRESOLINE Take 1 tablet (100 mg total) by mouth 3 (three) times daily. What changed: medication strength   Jardiance 25 MG Tabs tablet Generic drug: empagliflozin Take 25 mg by mouth daily.   Kerendia 20 MG Tabs Generic drug: Finerenone Take 1 tablet by mouth daily.   magnesium oxide 400 (240 Mg) MG tablet Commonly known as: MAG-OX TAKE 1 TABLET BY MOUTH EVERY DAY   omeprazole 20 MG capsule Commonly known as: PRILOSEC Take 1 capsule (20 mg total) by mouth daily.   OneTouch Verio test strip Generic drug: glucose blood Use to check blood sugar 2 times a day   oxyCODONE 5 MG immediate release tablet Commonly known as: Oxy IR/ROXICODONE Take 1 tablet (5 mg total) by mouth every 6 (six) hours as needed for severe pain.   Ozempic (2 MG/DOSE) 8 MG/3ML Sopn Generic  drug: Semaglutide (2 MG/DOSE) Inject 2 mg into the skin once a week.   tadalafil 20 MG tablet Commonly known as: CIALIS TAKE ONE TABLET BY MOUTH DAILY AS NEEDED FOR ERECTILE DYSFUNCTION   tiZANidine 4 MG tablet Commonly known as: ZANAFLEX Take 4 mg by mouth 3 (three) times daily.   torsemide 20 MG tablet Commonly known as: DEMADEX Take 1 tablet (20 mg total) by mouth daily. Start taking on: March 26, 2022 What changed: These instructions start on March 26, 2022. If you are unsure what to do until then, ask your doctor or other care provider.   Toujeo Max SoloStar 300 UNIT/ML Solostar Pen Generic drug: insulin glargine (2 Unit Dial) Inject 15 Units into the skin every evening. What changed: how much to take       Allergies  Allergen Reactions   Metformin And Related Other (See Comments)    Due to CHF and CRI    Follow-up Information     [name], DPM. Schedule an appointment as soon as possible for a visit in 1 week(s).   Specialty: Podiatry Contact information: [address] [address] [phone]         [name], MD. Schedule an  appointment as soon as possible for a visit in 2 week(s).   Specialty: Internal Medicine Contact information: [place] [address] [phone]         Dimas Aguas, MD. Schedule an appointment as soon as possible for a visit in 1 week(s).   Specialty: Internal Medicine Why: f/u in 1-2 weeks. Contact information: [address] [place] [address] [phone]                  The results of significant diagnostics from this hospitalization (including imaging, microbiology, ancillary and laboratory) are listed below for reference.    Significant Diagnostic Studies: DG Foot Complete Left  Result Date: 03/21/2022 CLINICAL DATA:  Status post foot surgery. Left great toe amputation today EXAM: LEFT FOOT - COMPLETE 3+ VIEW COMPARISON:  MRI examination dated March 18, 2022 FINDINGS: Postsurgical changes for amputation through the proximal phalangeal shaft of the first digit. Remaining osseous structures is within normal limits soft tissue swelling and edema about the amputation site as expected IMPRESSION: Postsurgical changes for amputation through the proximal phalanx of the first digit. Electronically Signed   By: Keane Police D.O.   On: 03/21/2022 19:49   US RENAL  Result Date: 03/18/2022 CLINICAL DATA:  Acute kidney injury EXAM: RENAL / URINARY TRACT ULTRASOUND COMPLETE COMPARISON:  None Available. FINDINGS: Right Kidney: Renal measurements: 9.3 x 4.6 x 4.9 cm = volume: 108 mL. Echogenicity within normal limits. No mass or hydronephrosis visualized. Left Kidney: Renal measurements: 10.0 x 5.6 x 5.6 cm = volume: 163 mL. Echogenicity within normal limits. No mass or hydronephrosis visualized. Bladder: Appears normal for degree of bladder distention. Other: None. IMPRESSION: No significant sonographic abnormality of the kidneys. Electronically Signed   By: Ronney Asters M.D.   On: 03/18/2022 23:42   MR FOOT LEFT WO CONTRAST  Result Date: 03/18/2022 CLINICAL  DATA:  Left great toe wound infection, concern for osteomyelitis EXAM: MRI OF THE LEFT FOOT WITHOUT CONTRAST TECHNIQUE: Multiplanar, multisequence MR imaging of the left forefoot was performed. No intravenous contrast was administered. COMPARISON:  Radiograph performed earlier on the same date FINDINGS: Bones/Joint/Cartilage There is bone marrow edema of the distal phalanx of the first digit. There is also small  joint effusion in the interphalangeal joint of the first digit. Marrow signal within remaining osseous structures is within normal limits Ligaments Lisfranc and collateral ligaments appear intact. 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 Soft tissues Marked subcutaneous soft tissue edema about the foot prominent about the dorsum of the foot. Deep skin wound about the dorsal aspect of the first distal phalanx with surrounding edema and inflammatory changes. No fluid collection or abscess IMPRESSION: 1. Bone marrow edema of the distal phalanx of the first digit concerning for osteomyelitis. 2. Small joint effusion of the interphalangeal joint of the first digit concerning for septic arthritis. 3. Soft tissue wound about the dorsal aspect of the first distal phalanx with surrounding edema and inflammatory changes. No fluid collection or abscess. 4. 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 Electronically Signed   By: Keane Police D.O.   On: 03/18/2022 22:48   DG Foot Complete Left  Result Date: 03/18/2022 CLINICAL DATA:  Ulcer EXAM: LEFT FOOT - COMPLETE 3 VIEW COMPARISON:  None Available. FINDINGS: Soft tissue swelling noted of the great toe. There is cortical irregularity with possible erosion of the distal cortex distal phalanx which is concerning for osteomyelitis. No traumatic osseous abnormalities. There are plantar and posterior calcaneal spurs. No osteolytic or  osteoblastic changes. IMPRESSION: Possible osteomyelitis involving the great toe distal phalanx. Soft tissue swelling. Calcaneal spurs. Electronically Signed   By: Sammie Bench M.D.   On: 03/18/2022 09:41    Microbiology: Recent Results (from the past 240 hour(s))  Blood culture (routine x 2)     Status: None (Preliminary result)   Collection Time: 03/18/22  8:18 PM   Specimen: Left Antecubital; Blood  Result Value Ref Range Status   Specimen Description   Final    LEFT ANTECUBITAL Performed at [facility], [place] [address], [place], [place] [address]    Special Requests   Final    BOTTLES DRAWN AEROBIC AND ANAEROBIC Blood Culture adequate volume Performed at [facility] [address] [place], [place] [address]    Culture   Final    NO GROWTH 4 DAYS Performed at [facility], [place] [address] [place], [place] [address]    Report Status PENDING  Incomplete  Blood culture (routine x 2)     Status: None (Preliminary result)   Collection Time: 03/18/22  9:58 PM   Specimen: BLOOD LEFT HAND  Result Value Ref Range Status   Specimen Description   Final    BLOOD LEFT HAND Performed at [facility] [address], [place], [place] [address]    Special Requests   Final    BOTTLES DRAWN AEROBIC AND ANAEROBIC Blood Culture adequate volume Performed at [facility] [address], [place], [place] [address]    Culture   Final    NO GROWTH 4 DAYS Performed at [facility], [place] [address], [place], [place] [address]    Report Status PENDING  Incomplete     Labs: Basic Metabolic Panel: Recent Labs  Lab 03/18/22 1826 03/18/22 2156 03/19/22 0439 03/20/22 0516 03/21/22 0519 03/22/22 0459  NA 134*  --  138 138 142 139  K 5.2*  --  4.8 4.6 4.7 4.5  CL 99  --  103 107 109 108  CO2 23  --  24 22 20* 21*  GLUCOSE 236*  --  93 119*  103* 118*  BUN 66*  --  64* 70* 60* 53*  CREATININE 4.11*   --  3.93* 3.41* 2.79* 2.43*  CALCIUM 8.9  --  9.1 9.2 9.1 9.2  MG  --  2.6*  --   --   --   --   PHOS  --   --   --   --  3.8 3.4   Liver Function Tests: Recent Labs  Lab 03/21/22 0519 03/22/22 0459  ALBUMIN 3.1* 3.1*   No results for input(s): "LIPASE", "AMYLASE" in the last 168 hours. No results for input(s): "AMMONIA" in the last 168 hours. CBC: Recent Labs  Lab 03/18/22 1826 03/19/22 0439 03/21/22 0519 03/22/22 0459  WBC 10.1 8.9 9.3 10.3  NEUTROABS 7.6  --  6.8 7.7  HGB 10.6* 10.3* 11.0* 11.0*  HCT 33.2* 32.6* 35.2* 34.9*  MCV 90.2 89.6 91.2 90.6  PLT 354 335 376 374   Cardiac Enzymes: No results for input(s): "CKTOTAL", "CKMB", "CKMBINDEX", "TROPONINI" in the last 168 hours. BNP: BNP (last 3 results) Recent Labs    03/19/22 0439  BNP 27.1    ProBNP (last 3 results) No results for input(s): "PROBNP" in the last 8760 hours.  CBG: Recent Labs  Lab 03/22/22 0003 03/22/22 0408 03/22/22 0722 03/22/22 1134 03/22/22 1544  GLUCAP 106* 118* 112* 176* 143*       Signed:  Irine Seal MD.  [facility] 03/22/2022, 3:50 PM

## 2022-03-22 NOTE — TOC Progression Note (Signed)
Transition of Care [facility]) - Progression Note    Patient Details  Name: Roberto Allen MRN: [id number] Date of Birth: 03-13-1960  Transition of Care [facility]) CM/SW [place], LCSW Phone Number: 03/22/2022, 10:59 AM  Clinical Narrative:    No HH/DME needs identified. TOC consult has been cleared. Please re-consult should needs arise. TOC signing off at this time.    Expected Discharge Plan: Home/Self Care Barriers to Discharge: Barriers Resolved, No Barriers Identified  Expected Discharge Plan and Services In-house Referral: Clinical Social Work Discharge Planning Services: NA   Living arrangements for the past 2 months: Single Family Home                 DME Arranged: N/A DME Agency: NA                   Social Determinants of Health (SDOH) Interventions SDOH Screenings   Food Insecurity: No Food Insecurity (03/21/2022)  Housing: Low Risk  (03/21/2022)  Transportation Needs: No Transportation Needs (03/21/2022)  Utilities: Not At Risk (03/21/2022)  Depression (PHQ2-9): Low Risk  (01/29/2021)  Tobacco Use: Medium Risk (03/22/2022)    Readmission Risk Interventions    03/22/2022   10:58 AM 03/19/2022   12:44 PM  Readmission Risk Prevention Plan  Post Dischage Appt  Complete  Medication Screening  Complete  Transportation Screening Complete Complete  PCP or Specialist Appt within 5-7 Days Complete   Home Care Screening Complete   Medication Review (RN CM) Complete

## 2022-03-22 NOTE — Progress Notes (Signed)
Patient discharged via wheelchair accompanied by wife. Pt. Is alert and oriented, no distress. Discharge instruction and education discussed with patient. All personal belongings are with the family.

## 2022-03-22 NOTE — Progress Notes (Signed)
Orthopedic Tech Progress Note Patient Details:  Roberto Allen [birth year] [id number]  Ortho Devices Type of Ortho Device: Postop shoe/boot Ortho Device/Splint Location: LLE Ortho Device/Splint Interventions: Ordered, Application, Adjustment   Post Interventions Patient Tolerated: Well Instructions Provided: Care of device, Adjustment of device  [facility] 03/22/2022, 4:45 PM

## 2022-03-23 ENCOUNTER — Telehealth: Payer: Self-pay

## 2022-03-23 LAB — CULTURE, BLOOD (ROUTINE X 2)
Culture: NO GROWTH
Culture: NO GROWTH
Special Requests: ADEQUATE
Special Requests: ADEQUATE

## 2022-03-23 LAB — SURGICAL PATHOLOGY

## 2022-03-23 NOTE — Telephone Encounter (Signed)
Opened in error

## 2022-03-24 NOTE — Telephone Encounter (Signed)
I s/w the pt and he is agreeable to in office appt. Pt opted for 04/05/22 @ 8:50 with Coletta Memos, FNP. I will update all parties involved.

## 2022-03-24 NOTE — Telephone Encounter (Signed)
   Name: Camari Wisham  DOB: [birth year]  MRN: [id number]  Primary Cardiologist: Sanda Klein, MD  Chart reviewed as part of pre-operative protocol coverage. Because of Leron Speiser's past medical history and time since last visit, he will require a follow-up in-office visit in order to better assess preoperative cardiovascular risk. Per chart review, patient was recently admitted with multiple medical issues including septic arthritis, osteomyelitis, toe amputation, AKI on CKD, with diuretics held during admission due to AKI, with plan to resume as OP with nephrology on board as well. Notes also indicate some confusion about taking both furosemide and torsemide before that admission. Therefore, difficult to fully clear by chart review. Recommend in-person OV to formally reassess, bringing all meds to appt.  Pre-op covering staff: - Please schedule appointment and call patient to inform them. If patient already had an upcoming appointment within acceptable timeframe, please add "pre-op clearance" to the appointment notes so provider is aware. - Please contact requesting surgeon's office via preferred method (i.e, phone, fax) to inform them of need for appointment prior to surgery.  No meds listed as needing to be held.  [name], PA-C  03/24/2022, 12:38 PM

## 2022-03-30 ENCOUNTER — Ambulatory Visit (INDEPENDENT_AMBULATORY_CARE_PROVIDER_SITE_OTHER): Payer: 59 | Admitting: Podiatry

## 2022-03-30 DIAGNOSIS — Z89419 Acquired absence of unspecified great toe: Secondary | ICD-10-CM

## 2022-03-30 NOTE — Progress Notes (Signed)
Subjective:  Patient ID: Roberto Allen, male    DOB: 05-Jan-1961,  MRN: [id number]  Chief Complaint  Patient presents with   Routine [facility] follow up     DOS: 03/21/22 Procedure: Left partial hallux amputation.  62 y.o. male returns for post-op check.  Patient states that he is doing well.  No acute complaints.  He has been using the surgical shoe.  Bandages clean dry and intact  Review of Systems: Negative except as noted in the HPI. Denies N/V/F/Ch.  Past Medical History:  Diagnosis Date   CAD (coronary artery disease)    a. LHC 4/13: mD1 25%, oD2 25%, pOM1 25%, pRCA 25%, dRCA 40%, EF 30%   CHF (congestive heart failure) (HCC)    Chronic kidney disease (CKD), stage III (moderate) (HCC)    Chronic systolic heart failure (HCC)    a. EF 30-35% in 04/2011, improved to 55-60% in 01/2012.   COPD (chronic obstructive pulmonary disease) (HCC)    Diabetes mellitus    GERD (gastroesophageal reflux disease)    History of pneumonia    Hyperkalemia    a. 12/2012 - spironolactone, KCl supp discontinued.   Hyperlipidemia    Hypertension    Hypertriglyceridemia    a. 12/2012 - started on fenofibrate.   NICM (nonischemic cardiomyopathy) ([place])    a. Echo 3/13: mild LVH, EF 30-35%, grade 3 diast dysfxn, mod LAE;  RHC 4/13:  RA 8, RV 58/10, PA 56/18, mean 35, PCWP mean 17, LV 154/27, CO 4.3, CI 2.0. b. EF Improved >>> Echo (01/2012):  Mild LVH, EF 55-60%, no RWMA, Gr 1 DD    Current Outpatient Medications:    ACCU-CHEK FASTCLIX LANCETS MISC, USE TO MONITOR BLOOD GLUCOSE 3 TIME(S) DAILY (Patient not taking: Reported on 01/14/2022), Disp: , Rfl: 5   allopurinol (ZYLOPRIM) 100 MG tablet, Take 50 mg by mouth daily., Disp: , Rfl:    aspirin EC 81 MG EC tablet, Take 1 tablet (81 mg total) by mouth daily., Disp:  , Rfl:    atorvastatin (LIPITOR) 80 MG tablet, Take 1 tablet (80 mg total) by mouth daily., Disp: 90 tablet, Rfl: 3   carvedilol (COREG) 12.5 MG tablet, Take 1 tablet (12.5 mg  total) by mouth 2 (two) times daily., Disp: 60 tablet, Rfl: 1   doxycycline (VIBRA-TABS) 100 MG tablet, Take 1 tablet (100 mg total) by mouth every 12 (twelve) hours for 10 days., Disp: 20 tablet, Rfl: 0   glucose blood (ONETOUCH VERIO) test strip, Use to check blood sugar 2 times a day (Patient not taking: Reported on 01/14/2022), Disp: 100 each, Rfl: 5   HUMALOG KWIKPEN 100 UNIT/ML KwikPen, Inject 4 Units into the skin 3 (three) times daily as needed (high blood sugar)., Disp: 15 mL, Rfl: 11   hydrALAZINE (APRESOLINE) 100 MG tablet, Take 1 tablet (100 mg total) by mouth 3 (three) times daily., Disp: 90 tablet, Rfl: 1   JARDIANCE 25 MG TABS tablet, Take 25 mg by mouth daily., Disp: , Rfl:    KERENDIA 20 MG TABS, Take 1 tablet by mouth daily., Disp: , Rfl:    magnesium oxide (MAG-OX) 400 (240 Mg) MG tablet, TAKE 1 TABLET BY MOUTH EVERY DAY, Disp: 90 tablet, Rfl: 1   omeprazole (PRILOSEC) 20 MG capsule, Take 1 capsule (20 mg total) by mouth daily., Disp: 90 capsule, Rfl: 3   oxyCODONE (OXY IR/ROXICODONE) 5 MG immediate release tablet, Take 1 tablet (5 mg total) by mouth every 6 (six) hours  as needed for severe pain., Disp: 15 tablet, Rfl: 0   OZEMPIC, 2 MG/DOSE, 8 MG/3ML SOPN, Inject 2 mg into the skin once a week., Disp: , Rfl:    tadalafil (CIALIS) 20 MG tablet, TAKE ONE TABLET BY MOUTH DAILY AS NEEDED FOR ERECTILE DYSFUNCTION, Disp: 30 tablet, Rfl: 5   tiZANidine (ZANAFLEX) 4 MG tablet, Take 4 mg by mouth 3 (three) times daily., Disp: , Rfl:    torsemide (DEMADEX) 20 MG tablet, Take 1 tablet (20 mg total) by mouth daily., Disp: 90 tablet, Rfl: 1   TOUJEO MAX SOLOSTAR 300 UNIT/ML Solostar Pen, Inject 15 Units into the skin every evening., Disp: , Rfl:   Social History   Tobacco Use  Smoking Status Former   Packs/day: 1.00   Years: 20.00   Total pack years: 20.00   Types: Cigarettes   Quit date: 09/15/2006   Years since quitting: 15.5  Smokeless Tobacco Never    Allergies  Allergen  Reactions   Metformin And Related Other (See Comments)    Due to CHF and CRI   Objective:  There were no vitals filed for this visit. There is no height or weight on file to calculate BMI. Constitutional Well developed. Well nourished.  Vascular Foot warm and well perfused. Capillary refill normal to all digits.   Neurologic Normal speech. Oriented to person, place, and time. Epicritic sensation to light touch grossly present bilaterally.  Dermatologic Skin healing well without signs of infection. Skin edges well coapted without signs of infection.  Orthopedic: Tenderness to palpation noted about the surgical site.   Radiographs: None Assessment:   1. History of amputation of great toe [facility])    Plan:  Patient was evaluated and treated and all questions answered.  S/p foot surgery left -Progressing as expected post-operatively. -XR: See above -WB Status: Weightbearing as tolerated in surgical shoe -Sutures: Intact.  No clinical signs of dehiscence noted no complication noted. -Medications: None -Foot redressed.  No follow-ups on file.

## 2022-04-02 NOTE — Progress Notes (Deleted)
Cardiology Clinic Note   Patient Name: Roberto Allen Date of Encounter: 04/02/2022  Primary Care Provider:  [name], MD Primary Cardiologist:  Sanda Klein, MD  Patient Profile    Roberto Allen 62 year old male presents the clinic today for follow-up evaluation of his nonobstructive CAD and CHF.  Past Medical History    Past Medical History:  Diagnosis Date   CAD (coronary artery disease)    a. LHC 4/13: mD1 25%, oD2 25%, pOM1 25%, pRCA 25%, dRCA 40%, EF 30%   CHF (congestive heart failure) (HCC)    Chronic kidney disease (CKD), stage III (moderate) (HCC)    Chronic systolic heart failure (HCC)    a. EF 30-35% in 04/2011, improved to 55-60% in 01/2012.   COPD (chronic obstructive pulmonary disease) (HCC)    Diabetes mellitus    GERD (gastroesophageal reflux disease)    History of pneumonia    Hyperkalemia    a. 12/2012 - spironolactone, KCl supp discontinued.   Hyperlipidemia    Hypertension    Hypertriglyceridemia    a. 12/2012 - started on fenofibrate.   NICM (nonischemic cardiomyopathy) ([place])    a. Echo 3/13: mild LVH, EF 30-35%, grade 3 diast dysfxn, mod LAE;  RHC 4/13:  RA 8, RV 58/10, PA 56/18, mean 35, PCWP mean 17, LV 154/27, CO 4.3, CI 2.0. b. EF Improved >>> Echo (01/2012):  Mild LVH, EF 55-60%, no RWMA, Gr 1 DD   Past Surgical History:  Procedure Laterality Date   AMPUTATION TOE Left 03/21/2022   Procedure: AMPUTATION TOE;  Surgeon: [name], DPM;  Location: [facility] ORS;  Service: Podiatry;  Laterality: Left;   CARDIAC CATHETERIZATION  2013   non-obs dz 25-40% multiple vessels   TENOTOMY  2018    Allergies  Allergies  Allergen Reactions   Metformin And Related Other (See Comments)    Due to CHF and CRI    History of Present Illness    Schaun Decarli has a PMH of nonobstructive CAD, combined systolic and diastolic CHF, CKD stage IIIb, HTN, HLD, diabetes, OSA, COPD and GERD.  He underwent cardiac catheterization 4/13 which showed  nonobstructive CAD (D1 25%, ostial D2 25%, N1 25%, proximal RCA 25%, distal RCA 40%.  His echocardiogram 12/13 showed an improved EF of 55% up from 30-35%.  He required hospitalization 11/14 for chest pain.  Stress testing at that time showed no evidence of ischemia.  He was seen again in the ED 12/15/2020.  He was admitted until 12/19/2020 with worsening shortness of breath, lower extremity swelling, and severely elevated blood pressure in the setting of HFmrEF.  His echocardiogram showed reduced EF of 40-45%, repeat stress testing showed no ischemia.  He received IV diuresis and was noted to have improvement in his CHF symptoms.  He was limited in his GDMT due to CKD.  He was discharged on amlodipine, atorvastatin, carvedilol, hydralazine, isosorbide mononitrate, empagliflozin, losartan, torsemide.  He followed up with PCP who noted a serum creatinine of 3.19.  This was up from 2.65 at hospital discharge.  Medication continuation was strongly encouraged and he was referred to nephrology.  He was seen in the heart failure clinic on 12/26/2020.  He was noted to be euvolemic at that time despite a 4 pound weight gain since hospital discharge.  He was noted to have improved blood pressure.  He was continued on carvedilol, Jardiance, losartan, hydralazine, isosorbide, and torsemide.  He was seen in follow-up 04/22/2021.  He was doing well at  that time from a cardiac standpoint.  His lower extremity edema had resolved.  He was seen by [facility] for PA-C on 01/14/2022.  During that time he reported intermittent lower extremity edema.  He was taking extra torsemide 20 mg twice daily.  He reported as needed dosing with spironolactone.  Nephrology had changed dosing.  He did note occasional shortness of breath on exertion which was stable.  He denied chest pain.  He reported compliance with daily weights.  He was planning to undergo colonoscopy and was requesting preoperative cardiac evaluation.  He was felt to be  acceptable overall risk for colonoscopy.  He was able to complete greater than 4 METS of physical activity.  His aspirin was allowed to be held in the perioperative period.  Follow-up was planned for 6 months.  He was admitted to the hospital on 03/18/2022 with concern for osteomyelitis of his left foot.  He underwent amputation of his left great toe 03/21/2022.  He was placed on 10 days of doxycycline.  Close outpatient follow-up in 1 week with podiatry was recommended.  He presents to the clinic today for follow-up evaluation and states***  *** denies chest pain, shortness of breath, lower extremity edema, fatigue, palpitations, melena, hematuria, hemoptysis, diaphoresis, weakness, presyncope, syncope, orthopnea, and PND.  Essential hypertension-BP today*** Continue*** Heart healthy low-sodium diet-salty 6 given Increase physical activity as tolerated  Chronic combined systolic and diastolic CHF-weight stable.  Euvolemic today.  Weight stable.  Reports compliance with medical therapy. Echocardiogram showed an EF of 40-45% with G1 DD.  During recent hospitalization his torsemide and spironolactone were held due to AKI.  He received gentle hydration and remained euvolemic. Continue aspirin, beta-blocker, hydralazine, spironolactone, torsemide Daily weights Heart healthy low-sodium diet Elevate lower extremities when not active  Coronary artery disease-no chest pain today.  Denies recent episodes of chest discomfort.  Noted to have nonobstructive coronary disease on cardiac catheterization 2013.  Underwent nuclear stress testing 12/17/2012 which showed no ischemia. Continue aspirin, atorvastatin, carvedilol, Imdur  Hyperlipidemia-LDL*** Continue atorvastatin, aspirin Heart healthy low-sodium high-fiber diet.   Increase physical activity as tolerated  Diabetes mellitus-glucose 118 on 03/22/2022.  Underwent amputation of great toe left foot 03/21/2022.  Postoperatively was instructed to take 10 days  of doxycycline. Follows with PCP/podiatry  Disposition: Follow-up with Dr. Sallyanne Kuster or me in 4-6 months.    Home Medications    Prior to Admission medications   Medication Sig Start Date End Date Taking? Authorizing Provider  ACCU-CHEK FASTCLIX LANCETS MISC USE TO MONITOR BLOOD GLUCOSE 3 TIME(S) DAILY Patient not taking: Reported on 01/14/2022 05/05/17   [provider]  allopurinol (ZYLOPRIM) 100 MG tablet Take 50 mg by mouth daily.    [provider]  aspirin EC 81 MG EC tablet Take 1 tablet (81 mg total) by mouth daily. 05/02/19   Geradine Girt, DO  atorvastatin (LIPITOR) 80 MG tablet Take 1 tablet (80 mg total) by mouth daily. 07/20/21   Tower, Wynelle Fanny, MD  carvedilol (COREG) 12.5 MG tablet Take 1 tablet (12.5 mg total) by mouth 2 (two) times daily. 03/22/22   Eugenie Filler, MD  glucose blood [facility] VERIO) test strip Use to check blood sugar 2 times a day Patient not taking: Reported on 01/14/2022 12/24/20   Tower, Wynelle Fanny, MD  HUMALOG KWIKPEN 100 UNIT/ML KwikPen Inject 4 Units into the skin 3 (three) times daily as needed (high blood sugar). 03/22/22   Eugenie Filler, MD  hydrALAZINE (APRESOLINE) 100 MG tablet  Take 1 tablet (100 mg total) by mouth 3 (three) times daily. 03/22/22   Eugenie Filler, MD  JARDIANCE 25 MG TABS tablet Take 25 mg by mouth daily. 12/03/21   [provider]  KERENDIA 20 MG TABS Take 1 tablet by mouth daily. 02/18/22   [provider]  magnesium oxide (MAG-OX) 400 (240 Mg) MG tablet TAKE 1 TABLET BY MOUTH EVERY DAY 02/04/21   Tower, Wynelle Fanny, MD  omeprazole (PRILOSEC) 20 MG capsule Take 1 capsule (20 mg total) by mouth daily. 07/20/21   Tower, Wynelle Fanny, MD  oxyCODONE (OXY IR/ROXICODONE) 5 MG immediate release tablet Take 1 tablet (5 mg total) by mouth every 6 (six) hours as needed for severe pain. 03/22/22   Eugenie Filler, MD  OZEMPIC, 2 MG/DOSE, 8 MG/3ML SOPN Inject 2 mg into the skin once a week.    [provider]  tadalafil (CIALIS) 20 MG tablet TAKE ONE TABLET BY MOUTH DAILY AS NEEDED FOR ERECTILE DYSFUNCTION 11/28/20   Tower, Wynelle Fanny, MD  tiZANidine (ZANAFLEX) 4 MG tablet Take 4 mg by mouth 3 (three) times daily. 03/12/22   [provider]  torsemide (DEMADEX) 20 MG tablet Take 1 tablet (20 mg total) by mouth daily. 03/26/22   Eugenie Filler, MD  TOUJEO MAX SOLOSTAR 300 UNIT/ML Solostar Pen Inject 15 Units into the skin every evening. 03/22/22   Eugenie Filler, MD  potassium chloride (KLOR-CON) 20 MEQ packet Take 20 mEq by mouth 2 (two) times daily.  04/23/11  [provider]    Family History    Family History  Problem Relation Age of Onset   Diabetes Sister    Breast cancer Mother    Breast cancer Maternal Grandmother    He indicated that his mother is deceased. He indicated that the status of his sister is unknown. He indicated that his maternal grandmother is deceased.  Social History    Social History   Socioeconomic History   Marital status: Married    Spouse name: Not on file   Number of children: 6   Years of education: Not on file   Highest education level: Not on file  Occupational History   Occupation: [employer]    Comment: Dentist: [employer]  Tobacco Use   Smoking status: Former    Packs/day: 1.00    Years: 20.00    Total pack years: 20.00    Types: Cigarettes    Quit date: 09/15/2006    Years since quitting: 15.5   Smokeless tobacco: Never  Vaping Use   Vaping Use: Never used  Substance and Sexual Activity   Alcohol use: Not Currently    Comment: occ   Drug use: No   Sexual activity: Yes  Other Topics Concern   Not on file  Social History Narrative   Lives with wife.   Social Determinants of Health   Financial Resource Strain: Not on file  Food Insecurity: No Food Insecurity (03/21/2022)   Hunger Vital Sign    Worried About Running Out of Food in the Last Year: Never true    Ran Out of Food in the Last Year:  Never true  Transportation Needs: No Transportation Needs (03/21/2022)   PRAPARE - Hydrologist (Medical): No    Lack of Transportation (Non-Medical): No  Physical Activity: Not on file  Stress: Not on file  Social Connections: Not on file  Intimate Partner Violence:  Not At Risk (03/21/2022)   Humiliation, Afraid, Rape, and Kick questionnaire    Fear of Current or Ex-Partner: No    Emotionally Abused: No    Physically Abused: No    Sexually Abused: No     Review of Systems    General:  No chills, fever, night sweats or weight changes.  Cardiovascular:  No chest pain, dyspnea on exertion, edema, orthopnea, palpitations, paroxysmal nocturnal dyspnea. Dermatological: No rash, lesions/masses Respiratory: No cough, dyspnea Urologic: No hematuria, dysuria Abdominal:   No nausea, vomiting, diarrhea, bright red blood per rectum, melena, or hematemesis Neurologic:  No visual changes, wkns, changes in mental status. All other systems reviewed and are otherwise negative except as noted above.  Physical Exam    VS:  There were no vitals taken for this visit. , BMI There is no height or weight on file to calculate BMI. GEN: Well nourished, well developed, in no acute distress. HEENT: normal. Neck: Supple, no JVD, carotid bruits, or masses. Cardiac: RRR, no murmurs, rubs, or gallops. No clubbing, cyanosis, edema.  Radials/DP/PT 2+ and equal bilaterally.  Respiratory:  Respirations regular and unlabored, clear to auscultation bilaterally. GI: Soft, nontender, nondistended, BS + x 4. MS: no deformity or atrophy. Skin: warm and dry, no rash. Neuro:  Strength and sensation are intact. Psych: Normal affect.  Accessory Clinical Findings    Recent Labs: 03/18/2022: Magnesium 2.6 03/19/2022: B Natriuretic Peptide 27.1 03/22/2022: BUN 53; Creatinine, Ser 2.43; Hemoglobin 11.0; Platelets 374; Potassium 4.5; Sodium 139   Recent Lipid Panel    Component Value Date/Time    CHOL 261 (H) 11/28/2020 0844   TRIG (H) 11/28/2020 0844    507.0 Triglyceride is over 400; calculations on Lipids are invalid.   HDL 42.10 11/28/2020 0844   CHOLHDL 6 11/28/2020 0844   VLDL UNABLE TO CALCULATE IF TRIGLYCERIDE OVER 400 mg/dL 12/17/2012 0145   LDLCALC UNABLE TO CALCULATE IF TRIGLYCERIDE OVER 400 mg/dL 12/17/2012 0145   LDLDIRECT 120.0 11/28/2020 0844    No BP recorded.  {Refresh Note OR Click here to enter BP  :1}***    ECG personally reviewed by me today- *** - No acute changes  Echocardiogram 12/16/2020  Indications:     CHF-acute diastolic [id number]    History:         Patient has prior history of Echocardiogram examinations,  most                  recent 04/28/2019.    Sonographer:     Sherrie Sport  Referring Phys:  [id number]  Diagnosing Phys: Nelva Bush MD     Sonographer Comments: Suboptimal apical window.  IMPRESSIONS     1. Left ventricular ejection fraction, by estimation, is 40 to 45%. The  left ventricle has mildly decreased function. The left ventricle  demonstrates global hypokinesis. There is more severe hypokinesis of the  basal inferior/inferolateral segments.  There is moderate left ventricular hypertrophy. Left ventricular diastolic  parameters are consistent with Grade I diastolic dysfunction (impaired  relaxation).   2. Right ventricular systolic function is normal. The right ventricular  size is mildly enlarged. Tricuspid regurgitation signal is inadequate for  assessing PA pressure.   3. Left atrial size was mildly dilated.   4. Right atrial size was moderately dilated.   5. The mitral valve is normal in structure. Trivial mitral valve  regurgitation. No evidence of mitral stenosis.   6. The aortic valve has an indeterminant number of cusps. Aortic valve  regurgitation is not visualized. No aortic stenosis is present.   FINDINGS   Left Ventricle: Left ventricular ejection fraction, by estimation, is 40  to 45%. The  left ventricle has mildly decreased function. The left  ventricle demonstrates global hypokinesis. There is more severe  hypokinesis of the basal inferior/inferolateral  segments. The left ventricular internal cavity size was normal in size.  There is moderate left ventricular hypertrophy. Left ventricular diastolic  parameters are consistent with Grade I diastolic dysfunction (impaired  relaxation).   Right Ventricle: The right ventricular size is mildly enlarged. No  increase in right ventricular wall thickness. Right ventricular systolic  function is normal. Tricuspid regurgitation signal is inadequate for  assessing PA pressure.   Left Atrium: Left atrial size was mildly dilated.   Right Atrium: Right atrial size was moderately dilated.   Pericardium: The pericardium was not well visualized.   Mitral Valve: The mitral valve is normal in structure. Trivial mitral  valve regurgitation. No evidence of mitral valve stenosis.   Tricuspid Valve: The tricuspid valve is not well visualized. Tricuspid  valve regurgitation is trivial.   Aortic Valve: The aortic valve has an indeterminant number of cusps.  Aortic valve regurgitation is not visualized. No aortic stenosis is  present. Aortic valve mean gradient measures 2.0 mmHg. Aortic valve peak  gradient measures 3.2 mmHg. Aortic valve  area, by VTI measures 2.51 cm.   Pulmonic Valve: The pulmonic valve was grossly normal. Pulmonic valve  regurgitation is trivial. No evidence of pulmonic stenosis.   Aorta: The aortic root is normal in size and structure.   Pulmonary Artery: The pulmonary artery is of normal size.   Venous: The inferior vena cava was not well visualized.   IAS/Shunts: The interatrial septum was not well visualized.    Assessment & Plan   1.  ***   Jossie Ng. Eleana Tocco NP-C     04/02/2022, 6:49 AM [place] [address] Office [phone] Fax [phone]    I  spent***minutes examining this patient, reviewing medications, and using patient centered shared decision making involving her cardiac care.  Prior to her visit I spent greater than 20 minutes reviewing her past medical history,  medications, and prior cardiac tests.

## 2022-04-05 ENCOUNTER — Ambulatory Visit: Payer: 59 | Admitting: General Practice

## 2022-04-07 ENCOUNTER — Telehealth: Payer: Self-pay

## 2022-04-08 NOTE — Telephone Encounter (Signed)
Left a detailed vm explaining to continue the antibiotics until next appt.

## 2022-04-12 ENCOUNTER — Ambulatory Visit: Payer: 59 | Admitting: Podiatry

## 2022-04-13 ENCOUNTER — Encounter: Payer: Self-pay | Admitting: Podiatry

## 2022-04-13 ENCOUNTER — Ambulatory Visit (INDEPENDENT_AMBULATORY_CARE_PROVIDER_SITE_OTHER): Payer: 59 | Admitting: Podiatry

## 2022-04-13 DIAGNOSIS — Z89419 Acquired absence of unspecified great toe: Secondary | ICD-10-CM | POA: Diagnosis not present

## 2022-04-13 NOTE — Progress Notes (Signed)
Subjective:  Patient ID: Roberto Allen, male    DOB: [birth year],  MRN: [id number]  Chief Complaint  Patient presents with   Routine Post Op    Suture removal     DOS: 03/21/22 Procedure: Left partial hallux amputation.  62 y.o. male returns for post-op check.  Patient states that he is doing well.  No acute complaints.  He has been using the surgical shoe.  Bandages clean dry and intact  Review of Systems: Negative except as noted in the HPI. Denies N/V/F/Ch.  Past Medical History:  Diagnosis Date   CAD (coronary artery disease)    a. LHC 4/13: mD1 25%, oD2 25%, pOM1 25%, pRCA 25%, dRCA 40%, EF 30%   CHF (congestive heart failure) (HCC)    Chronic kidney disease (CKD), stage III (moderate) (HCC)    Chronic systolic heart failure (HCC)    a. EF 30-35% in 04/2011, improved to 55-60% in 01/2012.   COPD (chronic obstructive pulmonary disease) (HCC)    Diabetes mellitus    GERD (gastroesophageal reflux disease)    History of pneumonia    Hyperkalemia    a. 12/2012 - spironolactone, KCl supp discontinued.   Hyperlipidemia    Hypertension    Hypertriglyceridemia    a. 12/2012 - started on fenofibrate.   NICM (nonischemic cardiomyopathy) ([place])    a. Echo 3/13: mild LVH, EF 30-35%, grade 3 diast dysfxn, mod LAE;  RHC 4/13:  RA 8, RV 58/10, PA 56/18, mean 35, PCWP mean 17, LV 154/27, CO 4.3, CI 2.0. b. EF Improved >>> Echo (01/2012):  Mild LVH, EF 55-60%, no RWMA, Gr 1 DD    Current Outpatient Medications:    ACCU-CHEK FASTCLIX LANCETS MISC, USE TO MONITOR BLOOD GLUCOSE 3 TIME(S) DAILY (Patient not taking: Reported on 01/14/2022), Disp: , Rfl: 5   allopurinol (ZYLOPRIM) 100 MG tablet, Take 50 mg by mouth daily., Disp: , Rfl:    aspirin EC 81 MG EC tablet, Take 1 tablet (81 mg total) by mouth daily., Disp:  , Rfl:    atorvastatin (LIPITOR) 80 MG tablet, Take 1 tablet (80 mg total) by mouth daily., Disp: 90 tablet, Rfl: 3   carvedilol (COREG) 12.5 MG tablet, Take 1 tablet (12.5 mg  total) by mouth 2 (two) times daily., Disp: 60 tablet, Rfl: 1   glucose blood (ONETOUCH VERIO) test strip, Use to check blood sugar 2 times a day (Patient not taking: Reported on 01/14/2022), Disp: 100 each, Rfl: 5   HUMALOG KWIKPEN 100 UNIT/ML KwikPen, Inject 4 Units into the skin 3 (three) times daily as needed (high blood sugar)., Disp: 15 mL, Rfl: 11   hydrALAZINE (APRESOLINE) 100 MG tablet, Take 1 tablet (100 mg total) by mouth 3 (three) times daily., Disp: 90 tablet, Rfl: 1   JARDIANCE 25 MG TABS tablet, Take 25 mg by mouth daily., Disp: , Rfl:    KERENDIA 20 MG TABS, Take 1 tablet by mouth daily., Disp: , Rfl:    magnesium oxide (MAG-OX) 400 (240 Mg) MG tablet, TAKE 1 TABLET BY MOUTH EVERY DAY, Disp: 90 tablet, Rfl: 1   omeprazole (PRILOSEC) 20 MG capsule, Take 1 capsule (20 mg total) by mouth daily., Disp: 90 capsule, Rfl: 3   oxyCODONE (OXY IR/ROXICODONE) 5 MG immediate release tablet, Take 1 tablet (5 mg total) by mouth every 6 (six) hours as needed for severe pain., Disp: 15 tablet, Rfl: 0   OZEMPIC, 2 MG/DOSE, 8 MG/3ML SOPN, Inject 2 mg into the skin once a week., Disp: ,  Rfl:    tadalafil (CIALIS) 20 MG tablet, TAKE ONE TABLET BY MOUTH DAILY AS NEEDED FOR ERECTILE DYSFUNCTION, Disp: 30 tablet, Rfl: 5   tiZANidine (ZANAFLEX) 4 MG tablet, Take 4 mg by mouth 3 (three) times daily., Disp: , Rfl:    torsemide (DEMADEX) 20 MG tablet, Take 1 tablet (20 mg total) by mouth daily., Disp: 90 tablet, Rfl: 1   TOUJEO MAX SOLOSTAR 300 UNIT/ML Solostar Pen, Inject 15 Units into the skin every evening., Disp: , Rfl:   Social History   Tobacco Use  Smoking Status Former   Packs/day: 1.00   Years: 20.00   Total pack years: 20.00   Types: Cigarettes   Quit date: 09/15/2006   Years since quitting: 15.6  Smokeless Tobacco Never    Allergies  Allergen Reactions   Metformin And Related Other (See Comments)    Due to CHF and CRI   Objective:  There were no vitals filed for this visit. There is  no height or weight on file to calculate BMI. Constitutional Well developed. Well nourished.  Vascular Foot warm and well perfused. Capillary refill normal to all digits.   Neurologic Normal speech. Oriented to person, place, and time. Epicritic sensation to light touch grossly present bilaterally.  Dermatologic Skin completely epithelialized.  No signs of Deis is noted no complication noted.  Orthopedic: No tenderness to palpation noted about the surgical site.   Radiographs: None Assessment:   No diagnosis found.  Plan:  Patient was evaluated and treated and all questions answered.  S/p foot surgery left -Clinically healed and has completely epithelialized incision.  At this time patient is officially discharged from my care if any foot and ankle issues arise in the future he will come back and see me.  No follow-ups on file.

## 2022-04-14 ENCOUNTER — Ambulatory Visit (INDEPENDENT_AMBULATORY_CARE_PROVIDER_SITE_OTHER): Payer: 59

## 2022-04-14 DIAGNOSIS — M2042 Other hammer toe(s) (acquired), left foot: Secondary | ICD-10-CM

## 2022-04-14 DIAGNOSIS — E1142 Type 2 diabetes mellitus with diabetic polyneuropathy: Secondary | ICD-10-CM

## 2022-04-14 DIAGNOSIS — M2041 Other hammer toe(s) (acquired), right foot: Secondary | ICD-10-CM

## 2022-04-14 NOTE — Progress Notes (Signed)
Patient presents to the office today for diabetic shoe and insole measuring.  Patient was measured with brannock device to determine size and width for 1 pair of extra depth shoes and foam casted for 3 pair of insoles.   ABN signed.   Documentation of medical necessity will be sent to patient's treating diabetic doctor to verify and sign.   Patient's diabetic provider: [name], MD   Shoes and insoles will be ordered at that time and patient will be notified for an appointment for fitting when they arrive.   Brannock measurement: 12.5 W  Patient shoe selection-   1st   Shoe choice:   [id number] SPEX  Shoe size ordered: 13W

## 2022-04-19 ENCOUNTER — Telehealth: Payer: Self-pay | Admitting: Podiatry

## 2022-04-19 NOTE — Telephone Encounter (Signed)
Good Morning, I am in need of 04/13/22 office note for pt. For Disability paperwork.

## 2022-05-31 ENCOUNTER — Other Ambulatory Visit: Payer: Self-pay | Admitting: Family Medicine

## 2022-05-31 ENCOUNTER — Ambulatory Visit (INDEPENDENT_AMBULATORY_CARE_PROVIDER_SITE_OTHER): Payer: 59 | Admitting: Podiatry

## 2022-05-31 DIAGNOSIS — Z89419 Acquired absence of unspecified great toe: Secondary | ICD-10-CM | POA: Diagnosis not present

## 2022-05-31 DIAGNOSIS — L84 Corns and callosities: Secondary | ICD-10-CM

## 2022-05-31 DIAGNOSIS — M2041 Other hammer toe(s) (acquired), right foot: Secondary | ICD-10-CM | POA: Diagnosis not present

## 2022-05-31 DIAGNOSIS — M2042 Other hammer toe(s) (acquired), left foot: Secondary | ICD-10-CM

## 2022-05-31 DIAGNOSIS — E1142 Type 2 diabetes mellitus with diabetic polyneuropathy: Secondary | ICD-10-CM

## 2022-05-31 NOTE — Progress Notes (Signed)
Patient presents today to pick up diabetic shoes and insoles.  Patient was dispensed 1 pair of diabetic shoes and 3 pairs of foam casted diabetic insoles.   He tried on the shoes with the insoles and the fit was satisfactory.   Will follow up next year for new order.    

## 2022-07-02 ENCOUNTER — Ambulatory Visit (INDEPENDENT_AMBULATORY_CARE_PROVIDER_SITE_OTHER): Payer: 59 | Admitting: Podiatry

## 2022-07-02 ENCOUNTER — Ambulatory Visit (INDEPENDENT_AMBULATORY_CARE_PROVIDER_SITE_OTHER): Payer: 59

## 2022-07-02 ENCOUNTER — Encounter: Payer: Self-pay | Admitting: Podiatry

## 2022-07-02 DIAGNOSIS — L97521 Non-pressure chronic ulcer of other part of left foot limited to breakdown of skin: Secondary | ICD-10-CM

## 2022-07-02 DIAGNOSIS — L97511 Non-pressure chronic ulcer of other part of right foot limited to breakdown of skin: Secondary | ICD-10-CM

## 2022-07-02 MED ORDER — DOXYCYCLINE HYCLATE 100 MG PO TABS: 100.0000 mg | ORAL_TABLET | Freq: Two times a day (BID) | ORAL | 0 refills | Status: DC

## 2022-07-08 NOTE — Progress Notes (Signed)
Subjective: Chief Complaint  Patient presents with   Foot Ulcer    2nd toe left and sub 1st MPJ right - had hallux amputation and once that area healed the 2nd toe started to develop a wound, swollen and dark, also dark callused area sub 1st (3-81 days)   62 year old male presents after the above concerns.  He states that he has developed a wound on the tip of the left second toe which has been there since an amputation of his big toe and the wound on the right foot is come back.  He has been some bloody drainage.  Minimal swelling.  No fevers or chills.  Objective: AAO x3, NAD DP/PT pulses palpable bilaterally, CRT less than 3 seconds On the left foot there is thick hyperkeratotic tissue with dried blood, callus to the distal aspect of the second toe and upon debridement there is superficial skin breakdown without any probing to bone or tunneling.  There is no fluctuance or crepitation.  There is no malodor. On the right foot submetatarsal 1 thick hyperkeratotic tissue and there is underlying ulceration with granulation tissue without any probing to bone or tunneling.  Localized edema but is no erythema or warmth. No pain with calf compression, swelling, warmth, erythema  Assessment: Bilateral foot ulcerations with localized cellulitis  Plan: -All treatment options discussed with the patient including all alternatives, risks, complications.  -X-rays obtained reviewed bilaterally.  3 views of the foot were obtained.  On the right foot on the first.  Increased soft tissue density one-to-one radiodense air pocket this corresponds to the area of the wound and upon debridement it does correspond to there is no purulence or crepitation.  On the left foot hammertoes are present.  No definitive focal changes suggest osteomyelitis. -Medically necessary wound debridement is performed bilaterally.  Sharply debrided each of the areas with any complications on healthy, granular tissue. -Prescribed  doxycycline -Today she was injected with small amount of antibiotic ointment. -Offloading at all times -Monitor for any clinical signs or symptoms of infection and directed to call the office immediately should any occur or go to the ER.  Return for 1-2 weeks for ulcer, x-ray.  Vivi Barrack DPM

## 2022-07-09 ENCOUNTER — Ambulatory Visit (INDEPENDENT_AMBULATORY_CARE_PROVIDER_SITE_OTHER): Payer: 59 | Admitting: Podiatry

## 2022-07-09 ENCOUNTER — Ambulatory Visit (INDEPENDENT_AMBULATORY_CARE_PROVIDER_SITE_OTHER): Payer: 59

## 2022-07-09 DIAGNOSIS — L97511 Non-pressure chronic ulcer of other part of right foot limited to breakdown of skin: Secondary | ICD-10-CM | POA: Diagnosis not present

## 2022-07-09 MED ORDER — DOXYCYCLINE HYCLATE 100 MG PO TABS: 100.0000 mg | ORAL_TABLET | Freq: Two times a day (BID) | ORAL | 0 refills | Status: DC

## 2022-07-09 NOTE — Patient Instructions (Signed)
Pre-Operative Instructions  Congratulations, you have decided to take an important step to improving your quality of life.  You can be assured that the doctors of [facility] will be with you every step of the way.  Plan to be at the surgery center/hospital at least 1 (one) hour prior to your scheduled time unless otherwise directed by the surgical center/hospital staff.  You must have a responsible adult accompany you, remain during the surgery and drive you home.  Make sure you have directions to the surgical center/hospital and know how to get there on time. For hospital based surgery you will need to obtain a history and physical form from your family physician within 1 month prior to the date of surgery- we will give you a form for you primary physician.  We make every effort to accommodate the date you request for surgery.  There are however, times where surgery dates or times have to be moved.  We will contact you as soon as possible if a change in schedule is required.   No Aspirin/Ibuprofen for one week before surgery.  If you are on aspirin, any non-steroidal anti-inflammatory medications (Mobic, Aleve, Ibuprofen) you should stop taking it 7 days prior to your surgery.  You make take Tylenol  For pain prior to surgery.  Medications- If you are taking daily heart and blood pressure medications, seizure, reflux, allergy, asthma, anxiety, pain or diabetes medications, make sure the surgery center/hospital is aware before the day of surgery so they may notify you which medications to take or avoid the day of surgery. No food or drink after midnight the night before surgery unless directed otherwise by surgical center/hospital staff. No alcoholic beverages 24 hours prior to surgery.  No smoking 24 hours prior to or 24 hours after surgery. Wear loose pants or shorts- loose enough to fit over bandages, boots, and casts. No slip on shoes, sneakers are best. Bring your boot with you to the  surgery center/hospital.  Also bring crutches or a walker if your physician has prescribed it for you.  If you do not have this equipment, it will be provided for you after surgery. If you have not been contracted by the surgery center/hospital by the day before your surgery, call to confirm the date and time of your surgery. Leave-time from work may vary depending on the type of surgery you have.  Appropriate arrangements should be made prior to surgery with your employer. Prescriptions will be provided immediately following surgery by your doctor.  Have these filled as soon as possible after surgery and take the medication as directed. Remove nail polish on the operative foot. Wash the night before surgery.  The night before surgery wash the foot and leg well with the antibacterial soap provided and water paying special attention to beneath the toenails and in between the toes.  Rinse thoroughly with water and dry well with a towel.  Perform this wash unless told not to do so by your physician.  Enclosed: 1 Ice pack (please put in freezer the night before surgery)   1 Hibiclens skin cleaner   Pre-op Instructions  If you have any questions regarding the instructions, do not hesitate to call our office at any point during this process.   [place]: [address] [place], [place] [address] [phone]  [place]: [address] [place], [place] [address] [phone]  Dr. Elray Dains, DPM  

## 2022-07-13 ENCOUNTER — Telehealth: Payer: Self-pay | Admitting: Urology

## 2022-07-13 NOTE — Telephone Encounter (Signed)
DOS - 07/14/22  AMPUTATION 2ND LEFT --- 78295  [employer]HC EFFECTIVE DATE - 02/15/22  DEDUCTIBLE - $1,750.00 W/ $0.00 REMAINING OOP - $6,850.00 W/ $0.00 REMAINING COINSURANCE - 20%  PER UHC WEBSITE FOR CPT CODE 62130 Notification or Prior Authorization is not required for the requested services This [employer] plan does not currently require a prior authorization for these services. If you have general questions about the prior authorization requirements, please call us at [phone] or visit [email or web address] > Clinician Resources > Advance and Admission Notification Requirements. The number above acknowledges your notification. Please write this number down for future reference. Notification is not a guarantee of coverage or payment.   Decision ID #: [id number]

## 2022-07-14 ENCOUNTER — Other Ambulatory Visit: Payer: Self-pay | Admitting: Podiatry

## 2022-07-14 ENCOUNTER — Encounter: Payer: Self-pay | Admitting: Podiatry

## 2022-07-14 DIAGNOSIS — M86672 Other chronic osteomyelitis, left ankle and foot: Secondary | ICD-10-CM | POA: Diagnosis not present

## 2022-07-14 HISTORY — PX: AMPUTATION TOE: SHX6595

## 2022-07-14 MED ORDER — DOXYCYCLINE HYCLATE 100 MG PO TABS: 100.0000 mg | ORAL_TABLET | Freq: Two times a day (BID) | ORAL | 0 refills | Status: DC

## 2022-07-14 MED ORDER — HYDROCODONE-ACETAMINOPHEN 5-325 MG PO TABS: 1.0000 | ORAL_TABLET | Freq: Four times a day (QID) | ORAL | 0 refills | Status: AC | PRN

## 2022-07-14 NOTE — Progress Notes (Signed)
Post-op medication sent 

## 2022-07-15 ENCOUNTER — Ambulatory Visit: Payer: 59 | Attending: Cardiology | Admitting: Cardiology

## 2022-07-15 ENCOUNTER — Encounter: Payer: Self-pay | Admitting: Cardiology

## 2022-07-15 VITALS — BP 108/66 | HR 73 | Ht 71.0 in | Wt 223.4 lb

## 2022-07-15 DIAGNOSIS — I2510 Atherosclerotic heart disease of native coronary artery without angina pectoris: Secondary | ICD-10-CM | POA: Diagnosis not present

## 2022-07-15 DIAGNOSIS — I10 Essential (primary) hypertension: Secondary | ICD-10-CM

## 2022-07-15 DIAGNOSIS — I428 Other cardiomyopathies: Secondary | ICD-10-CM | POA: Diagnosis not present

## 2022-07-15 NOTE — Progress Notes (Signed)
Cardiology Office Note:    Date:  07/15/2022   ID:  Roberto Allen, DOB Sep 01, 1960, MRN [id number]  PCP:  Karle Plumber, MD   [facility] Providers Cardiologist:  Debbe Odea, MD     Referring MD: Judy Pimple, MD   Chief Complaint  Patient presents with   Follow-up    Patient denies new or acute cardiac problems/concerns today.      History of Present Illness:    Roberto Allen is a 62 y.o. male with a hx of nonobstructive CAD, nonischemic cardiomyopathy, COPD, CKD 3, hypertension presenting for follow-up.  Previously seen at our [facility] practice from a cardiac perspective.  Compliant with medications as prescribed, denies chest pain or shortness of breath, edema well-controlled with current dose of torsemide 20 mg daily.  Feels well, no new concerns or complaints today.   Prior notes Echo 12/2020 EF 40 to 45% Lexiscan Myoview 12/2020 low risk, no evidence of ischemia Left heart cath 2013 25% diagonal, 25% OM1, 25% proximal RCA.  Past Medical History:  Diagnosis Date   CAD (coronary artery disease)    a. LHC 4/13: mD1 25%, oD2 25%, pOM1 25%, pRCA 25%, dRCA 40%, EF 30%   CHF (congestive heart failure) (HCC)    Chronic kidney disease (CKD), stage III (moderate) (HCC)    Chronic systolic heart failure (HCC)    a. EF 30-35% in 04/2011, improved to 55-60% in 01/2012.   COPD (chronic obstructive pulmonary disease) (HCC)    Diabetes mellitus    GERD (gastroesophageal reflux disease)    History of pneumonia    Hyperkalemia    a. 12/2012 - spironolactone, KCl supp discontinued.   Hyperlipidemia    Hypertension    Hypertriglyceridemia    a. 12/2012 - started on fenofibrate.   NICM (nonischemic cardiomyopathy) (HCC)    a. Echo 3/13: mild LVH, EF 30-35%, grade 3 diast dysfxn, mod LAE;  RHC 4/13:  RA 8, RV 58/10, PA 56/18, mean 35, PCWP mean 17, LV 154/27, CO 4.3, CI 2.0. b. EF Improved >>> Echo (01/2012):  Mild LVH, EF 55-60%, no RWMA, Gr 1 DD    Past  Surgical History:  Procedure Laterality Date   AMPUTATION TOE Left 03/21/2022   Procedure: AMPUTATION TOE;  Surgeon: Candelaria Stagers, DPM;  Location: [facility] ORS;  Service: Podiatry;  Laterality: Left;   AMPUTATION TOE Left 07/14/2022   tip of left 2nd toe   CARDIAC CATHETERIZATION  2013   non-obs dz 25-40% multiple vessels   TENOTOMY  2018    Current Medications: Current Meds  Medication Sig   allopurinol (ZYLOPRIM) 100 MG tablet Take 50 mg by mouth daily.   aspirin EC 81 MG EC tablet Take 1 tablet (81 mg total) by mouth daily.   atorvastatin (LIPITOR) 80 MG tablet Take 1 tablet (80 mg total) by mouth daily.   carvedilol (COREG) 12.5 MG tablet Take 1 tablet (12.5 mg total) by mouth 2 (two) times daily.   doxycycline (VIBRA-TABS) 100 MG tablet Take 1 tablet (100 mg total) by mouth 2 (two) times daily.   HUMALOG KWIKPEN 100 UNIT/ML KwikPen Inject 4 Units into the skin 3 (three) times daily as needed (high blood sugar).   hydrALAZINE (APRESOLINE) 100 MG tablet Take 1 tablet (100 mg total) by mouth 3 (three) times daily.   HYDROcodone-acetaminophen (NORCO/VICODIN) 5-325 MG tablet Take 1-2 tablets by mouth every 6 (six) hours as needed.   isosorbide mononitrate (IMDUR) 60 MG 24 hr tablet Take 60 mg  by mouth 2 (two) times daily.   JARDIANCE 25 MG TABS tablet Take 25 mg by mouth daily.   KERENDIA 20 MG TABS Take 1 tablet by mouth daily.   magnesium oxide (MAG-OX) 400 (240 Mg) MG tablet TAKE 1 TABLET BY MOUTH EVERY DAY   omeprazole (PRILOSEC) 20 MG capsule Take 1 capsule (20 mg total) by mouth daily.   OZEMPIC, 2 MG/DOSE, 8 MG/3ML SOPN Inject 2 mg into the skin once a week.   tadalafil (CIALIS) 20 MG tablet TAKE ONE TABLET BY MOUTH DAILY AS NEEDED FOR ERECTILE DYSFUNCTION   tiZANidine (ZANAFLEX) 4 MG tablet Take 4 mg by mouth 3 (three) times daily.   torsemide (DEMADEX) 20 MG tablet Take 1 tablet (20 mg total) by mouth daily.   TOUJEO MAX SOLOSTAR 300 UNIT/ML Solostar Pen Inject 15 Units into the  skin every evening.   [DISCONTINUED] furosemide (LASIX) 40 MG tablet Take 40 mg by mouth.   [DISCONTINUED] metoprolol succinate (TOPROL-XL) 100 MG 24 hr tablet Take by mouth.     Allergies:   Metformin and related   Social History   Socioeconomic History   Marital status: Married    Spouse name: Not on file   Number of children: 6   Years of education: Not on file   Highest education level: Not on file  Occupational History   Occupation: [employer]    Comment: Company secretary: [employer]  Tobacco Use   Smoking status: Former    Packs/day: 1.00    Years: 20.00    Additional pack years: 0.00    Total pack years: 20.00    Types: Cigarettes    Quit date: 09/15/2006    Years since quitting: 15.8   Smokeless tobacco: Never  Vaping Use   Vaping Use: Never used  Substance and Sexual Activity   Alcohol use: Not Currently    Comment: occ   Drug use: No   Sexual activity: Yes  Other Topics Concern   Not on file  Social History Narrative   Lives with wife.   Social Determinants of Health   Financial Resource Strain: Not on file  Food Insecurity: No Food Insecurity (03/21/2022)   Hunger Vital Sign    Worried About Running Out of Food in the Last Year: Never true    Ran Out of Food in the Last Year: Never true  Transportation Needs: No Transportation Needs (03/21/2022)   PRAPARE - Administrator, Civil Service (Medical): No    Lack of Transportation (Non-Medical): No  Physical Activity: Not on file  Stress: Not on file  Social Connections: Not on file     Family History: The patient's family history includes Breast cancer in his maternal grandmother and mother; Diabetes in his sister.  ROS:   Please see the history of present illness.     All other systems reviewed and are negative.  EKGs/Labs/Other Studies Reviewed:    The following studies were reviewed today:   EKG:  EKG is  ordered today.  The ekg ordered today demonstrates sinus rhythm,  first-degree AV block, PACs  Recent Labs: 03/18/2022: Magnesium 2.6 03/19/2022: B Natriuretic Peptide 27.1 03/22/2022: BUN 53; Creatinine, Ser 2.43; Hemoglobin 11.0; Platelets 374; Potassium 4.5; Sodium 139  Recent Lipid Panel    Component Value Date/Time   CHOL 261 (H) 11/28/2020 0844   TRIG (H) 11/28/2020 0844    507.0 Triglyceride is over 400; calculations on Lipids are invalid.   HDL  42.10 11/28/2020 0844   CHOLHDL 6 11/28/2020 0844   VLDL UNABLE TO CALCULATE IF TRIGLYCERIDE OVER 400 mg/dL 29/52/8413 2440   LDLCALC UNABLE TO CALCULATE IF TRIGLYCERIDE OVER 400 mg/dL 12/12/2534 6440   LDLDIRECT 120.0 11/28/2020 0844     Risk Assessment/Calculations:             Physical Exam:    VS:  BP 108/66 (BP Location: Left Arm, Patient Position: Sitting, Cuff Size: Normal)   Pulse 73   Ht 5\' 11"  (1.803 m)   Wt 223 lb 6.4 oz (101.3 kg)   SpO2 97%   BMI 31.16 kg/m     Wt Readings from Last 3 Encounters:  07/15/22 223 lb 6.4 oz (101.3 kg)  03/18/22 216 lb 6.4 oz (98.2 kg)  01/14/22 243 lb (110.2 kg)     GEN:  Well nourished, well developed in no acute distress HEENT: Normal NECK: No JVD; No carotid bruits CARDIAC: RRR, no murmurs, rubs, gallops RESPIRATORY:  Clear to auscultation without rales, wheezing or rhonchi  ABDOMEN: Soft, non-tender, non-distended MUSCULOSKELETAL:  No edema; No deformity  SKIN: Warm and dry NEUROLOGIC:  Alert and oriented x 3 PSYCHIATRIC:  Normal affect   ASSESSMENT:    1. NICM (nonischemic cardiomyopathy) (HCC)   2. Coronary artery disease involving native coronary artery of native heart without angina pectoris   3. Primary hypertension    PLAN:    In order of problems listed above:  NICM, EF 40 to 45%.  Patient appears euvolemic, describes NYHA class II symptoms.  Continue Coreg 12.5 mg twice daily, hydralazine 100 mg 3 times daily, Imdur 60 twice daily, torsemide 20 mg daily.  Jardiance 25 mg daily.  Repeat echocardiogram. Nonobstructive CAD,  denies chest pain, continue aspirin, Lipitor 80. Hypertension, BP controlled.  Continue Coreg, hydralazine, Imdur as scheduled.  Follow-up in 6 months.      Medication Adjustments/Labs and Tests Ordered: Current medicines are reviewed at length with the patient today.  Concerns regarding medicines are outlined above.  Orders Placed This Encounter  Procedures   EKG 12-Lead   ECHOCARDIOGRAM COMPLETE   No orders of the defined types were placed in this encounter.   Patient Instructions  Medication Instructions:   Your physician recommends that you continue on your current medications as directed. Please refer to the Current Medication list given to you today.  *If you need a refill on your cardiac medications before your next appointment, please call your pharmacy*   Lab Work:  None Ordered  If you have labs (blood work) drawn today and your tests are completely normal, you will receive your results only by: MyChart Message (if you have MyChart) OR A paper copy in the mail If you have any lab test that is abnormal or we need to change your treatment, we will call you to review the results.   Testing/Procedures:  Your physician has requested that you have an echocardiogram. Echocardiography is a painless test that uses sound waves to create images of your heart. It provides your doctor with information about the size and shape of your heart and how well your heart's chambers and valves are working. This procedure takes approximately one hour. There are no restrictions for this procedure. Please do NOT wear cologne, perfume, aftershave, or lotions (deodorant is allowed). Please arrive 15 minutes prior to your appointment time.    Follow-Up: At [facility], you and your health needs are our priority.  As part of our continuing mission to provide  you with exceptional heart care, we have created designated Provider Care Teams.  These Care Teams include your primary  Cardiologist (physician) and Advanced Practice Providers (APPs -  Physician Assistants and Nurse Practitioners) who all work together to provide you with the care you need, when you need it.  We recommend signing up for the patient portal called "MyChart".  Sign up information is provided on this After Visit Summary.  MyChart is used to connect with patients for Virtual Visits (Telemedicine).  Patients are able to view lab/test results, encounter notes, upcoming appointments, etc.  Non-urgent messages can be sent to your provider as well.   To learn more about what you can do with MyChart, go to [email or web address].    Your next appointment:   6 month(s)  Provider:   You may see Debbe Odea, MD or one of the following Advanced Practice Providers on your designated Care Team:   Nicolasa Ducking, NP Eula Listen, PA-C Cadence Fransico Michael, PA-C Charlsie Quest, NP   Signed, Debbe Odea, MD  07/15/2022 9:18 AM    [facility]

## 2022-07-15 NOTE — Patient Instructions (Signed)
Medication Instructions:   Your physician recommends that you continue on your current medications as directed. Please refer to the Current Medication list given to you today.  *If you need a refill on your cardiac medications before your next appointment, please call your pharmacy*   Lab Work:  None Ordered  If you have labs (blood work) drawn today and your tests are completely normal, you will receive your results only by: MyChart Message (if you have MyChart) OR A paper copy in the mail If you have any lab test that is abnormal or we need to change your treatment, we will call you to review the results.   Testing/Procedures:  Your physician has requested that you have an echocardiogram. Echocardiography is a painless test that uses sound waves to create images of your heart. It provides your doctor with information about the size and shape of your heart and how well your heart's chambers and valves are working. This procedure takes approximately one hour. There are no restrictions for this procedure. Please do NOT wear cologne, perfume, aftershave, or lotions (deodorant is allowed). Please arrive 15 minutes prior to your appointment time.    Follow-Up: At [facility], you and your health needs are our priority.  As part of our continuing mission to provide you with exceptional heart care, we have created designated Provider Care Teams.  These Care Teams include your primary Cardiologist (physician) and Advanced Practice Providers (APPs -  Physician Assistants and Nurse Practitioners) who all work together to provide you with the care you need, when you need it.  We recommend signing up for the patient portal called "MyChart".  Sign up information is provided on this After Visit Summary.  MyChart is used to connect with patients for Virtual Visits (Telemedicine).  Patients are able to view lab/test results, encounter notes, upcoming appointments, etc.  Non-urgent messages can  be sent to your provider as well.   To learn more about what you can do with MyChart, go to [email or web address].    Your next appointment:   6 month(s)  Provider:   You may see Debbe Odea, MD or one of the following Advanced Practice Providers on your designated Care Team:   Nicolasa Ducking, NP Eula Listen, PA-C Cadence Fransico Michael, PA-C Charlsie Quest, NP

## 2022-07-16 NOTE — Progress Notes (Signed)
Subjective: Chief Complaint  Patient presents with   Wound Check    Ulcer of right foot 2nd toe. Patient has been applying iodine to the open area and covering the area with a band-aid. Drainage noted when removing the band-aid. Obtained x-rays.     62 year old male presents after the above concerns.  Patient the wound is still present with tip of the toe is beginning some mild clear drainage.  No frank purulence.  Still swelling to the toe.  No fevers or chills.  Right foot is been doing great.    Objective: AAO x3, NAD DP/PT pulses palpable bilaterally, CRT less than 3 seconds On the left foot second toe distally is full-thickness ulceration which probes down to the bone.  There is localized edema present to the toe there is mild clear drainage but no frank purulence.  There is no ascending cellulitis.  No fluctuance or crepitation.  No.  On the right foot preulcerative callus noted submetatarsal 1 without any underlying ulceration drainage or signs of infection. No pain with calf compression, swelling, warmth, erythema  Assessment: Bilateral foot ulcerations with localized cellulitis  Plan: -All treatment options discussed with the patient including all alternatives, risks, complications.  -X-rays obtained reviewed bilaterally.  3 views of the feet were obtained.  There is some cortical changes of the distal half of the distal phalanx concerning for osteomyelitis. -We discussed the x-ray findings as well as clinical findings.  I do think given the infection we need to pursue at least partial toe amputation.  I discussed the risk of this and also the alternative to trying to save the toe he was proceed with amputation. -The incision placement as well as the postoperative course was discussed with the patient. I discussed risks of the surgery which include, but not limited to, infection, bleeding, pain, swelling, need for further surgery, delayed or nonhealing, painful or ugly scar, numbness  or sensation changes, over/under correction, recurrence, transfer lesions, further deformity,  DVT/PE, loss of toe/foot. Patient understands these risks and wishes to proceed with surgery. The surgical consent was reviewed with the patient all 3 pages were signed. No promises or guarantees were given to the outcome of the procedure. All questions were answered to the best of my ability. Before the surgery the patient was encouraged to call the office if there is any further questions. The surgery will be performed at the [facility] on an outpatient basis.  Vivi Barrack DPM

## 2022-07-20 ENCOUNTER — Ambulatory Visit (INDEPENDENT_AMBULATORY_CARE_PROVIDER_SITE_OTHER): Payer: 59

## 2022-07-20 ENCOUNTER — Ambulatory Visit (INDEPENDENT_AMBULATORY_CARE_PROVIDER_SITE_OTHER): Payer: 59 | Admitting: Podiatry

## 2022-07-20 DIAGNOSIS — Z89422 Acquired absence of other left toe(s): Secondary | ICD-10-CM

## 2022-07-20 DIAGNOSIS — Z9889 Other specified postprocedural states: Secondary | ICD-10-CM

## 2022-07-20 DIAGNOSIS — M86172 Other acute osteomyelitis, left ankle and foot: Secondary | ICD-10-CM

## 2022-07-20 MED ORDER — DOXYCYCLINE HYCLATE 100 MG PO TABS: 100.0000 mg | ORAL_TABLET | Freq: Two times a day (BID) | ORAL | 0 refills | Status: DC

## 2022-07-29 ENCOUNTER — Ambulatory Visit (INDEPENDENT_AMBULATORY_CARE_PROVIDER_SITE_OTHER): Payer: 59 | Admitting: Podiatry

## 2022-07-29 ENCOUNTER — Encounter: Payer: Self-pay | Admitting: Podiatry

## 2022-07-29 DIAGNOSIS — M86172 Other acute osteomyelitis, left ankle and foot: Secondary | ICD-10-CM

## 2022-07-29 DIAGNOSIS — Z89422 Acquired absence of other left toe(s): Secondary | ICD-10-CM

## 2022-07-30 NOTE — Telephone Encounter (Signed)
Angela, can you please assist with this? Thanks.  

## 2022-07-30 NOTE — Progress Notes (Signed)
Subjective: Chief Complaint  Patient presents with   Routine Post Op    Patient came in today for POV #1 DOS 07/14/2022 LT 2ND TOE AMPUTATION, patient denies any N/V/F/C/SOB, No pain, TX: Doxycyline, Surgical shoe  A1c- 8.3 BG-3   62 year old male presents for above concerns.  States he has been doing well.  No fevers or chills.  Objective: AAO x3, NAD DP/PT pulses palpable bilaterally, CRT less than 3 seconds Status post partial left second toe potation with sutures intact.  There is mild edema of the no erythema, warmth, ascending cellulitis there is no drainage or pus or any signs of infection noted today otherwise.  No pain with calf compression, swelling, warmth, erythema  Assessment: POV #1 s/p left partial 2nd toe amputation  Plan: -All treatment options discussed with the patient including all alternatives, risks, complications.  -Strays obtained reviewed.  3 views of the left foot were obtained.  Status post partial second toe potation previous hallux amputation.  No cortical changes suggest osteomyelitis at this time.  No soft tissue edema. -Incision appears to be healing well.  Small amount of antibiotic was applied followed by dressing.  He can keep the dressing clean, dry, intact for now. -Remain in surgical shoe -Elevation -Vascular: Has palpable pulses and previously healed hallux amputation well. -Patient encouraged to call the office with any questions, concerns, change in symptoms.   Vivi Barrack DPM

## 2022-07-30 NOTE — Progress Notes (Signed)
Subjective: Chief Complaint  Patient presents with   Routine Post Op    POV #2 DOS 07/14/2022 LT 2ND TOE AMPUTATION    62 year old male presents for above concerns.  States he has been doing well.  Presents today for possible suture removal.  No fevers or chills or reports.  No new concerns.  No injuries.  Objective: AAO x3, NAD DP/PT pulses palpable bilaterally, CRT less than 3 seconds Status post partial left second toe amputation with sutures intact.  There is no obvious.  There is mild but improved edema of the no erythema, warmth, ascending cellulitis there is no drainage or pus or any signs of infection noted today otherwise.  No pain with calf compression, swelling, warmth, erythema  Assessment: POV #2 s/p left partial 2nd toe amputation  Plan: -All treatment options discussed with the patient including all alternatives, risks, complications.  -Recommend half the sutures today with the remainder intact.  Appears to be healing well.  Xeroform was applied followed by dressing and.  He can keep the dressing clean, dry, intact until next appointment but he wishes he can change the bandage for similar dressing if he feels comfortable. -Continue surgical shoe -Elevate -Vascular: Has palpable pulses and previously healed hallux amputation well. -Patient encouraged to call the office with any questions, concerns, change in symptoms.  -Monitor for any clinical signs or symptoms of infection and directed to call the office immediately should any occur or go to the ER.  *Will plan on patient returning to work on 09/12/2022 due to the recent surgery.   Vivi Barrack DPM

## 2022-08-02 ENCOUNTER — Other Ambulatory Visit: Payer: Self-pay | Admitting: Family Medicine

## 2022-08-03 ENCOUNTER — Encounter: Payer: Self-pay | Admitting: Podiatry

## 2022-08-12 ENCOUNTER — Ambulatory Visit (INDEPENDENT_AMBULATORY_CARE_PROVIDER_SITE_OTHER): Payer: 59 | Admitting: Podiatry

## 2022-08-12 DIAGNOSIS — M86172 Other acute osteomyelitis, left ankle and foot: Secondary | ICD-10-CM | POA: Diagnosis not present

## 2022-08-16 NOTE — Progress Notes (Signed)
Subjective: Chief Complaint  Patient presents with   Routine Post Op    POV #3 DOS 07/14/2022 LT 2ND TOE AMPUTATION. Sutures are intact.     62 year old male presents for above concerns.  States he has been doing well.  Presents today for suture removal.  No fevers or chills or reports.  No new concerns.  No injuries.  Objective: AAO x3, NAD DP/PT pulses palpable bilaterally, CRT less than 3 seconds Status post partial left second toe amputation with sutures intact.  There is no evidence of dehiscence.  There is no surrounding erythema, ascending cellulitis there is no drainage or pus or any obvious signs of infection to the extremity.  No pain on exam.  He has started to get a preulcerative lesion of the distal aspect left third toe.  No change approximately to clinic is open lesions noted today. No pain with calf compression, swelling, warmth, erythema  Assessment: POV #2 s/p left partial 2nd toe amputation  Plan: -All treatment options discussed with the patient including all alternatives, risks, complications.  -I removed remainder of the sutures today without any complications incisions well coapted.  The prevention of toe protection.  Discussed getting into surgical shoe for now. Started gradual transition to regular shoe as tolerated. -Monitor for any clinical signs or symptoms of infection and directed to call the office immediately should any occur or go to the ER.  Return for 2-3 weeks for follow up of ampuation toe.  Vivi Barrack DPM

## 2022-09-02 ENCOUNTER — Encounter: Payer: Self-pay | Admitting: Podiatry

## 2022-09-02 ENCOUNTER — Ambulatory Visit (INDEPENDENT_AMBULATORY_CARE_PROVIDER_SITE_OTHER): Payer: 59 | Admitting: Podiatry

## 2022-09-02 DIAGNOSIS — L97512 Non-pressure chronic ulcer of other part of right foot with fat layer exposed: Secondary | ICD-10-CM

## 2022-09-02 DIAGNOSIS — M86172 Other acute osteomyelitis, left ankle and foot: Secondary | ICD-10-CM

## 2022-09-06 NOTE — Progress Notes (Signed)
Subjective: Chief Complaint  Patient presents with   Routine Post Op    POV #3 DOS 07/14/2022 LT 2ND TOE AMPUTATION. Sutures are intact.         Diabetic Ulcer    Ulcer right foot, bleeding, patient denies any pain, no N/V/C/SOB A1c-7.1 BG-12     62 year old male presents for above concerns.  Since the amputation sites been healing well and having any issues with this however the area on the right foot has opened back up and has been bleeding.  No injuries that he reports.  Denies any fevers or chills.  No other concerns today.  Objective: AAO x3, NAD DP/PT pulses palpable bilaterally, CRT less than 3 seconds Status post partial left second toe amputation with eschar formed and the incision appears to have healed.  There is no open lesions on the left foot.  Right foot: Hyperkeratotic lesion, dried blood and old blister noted submetatarsal 1.  Upon debridement ulceration is present without any probing to bone, undermining or tunneling.  There is no fluctuation, crepitation, malodor.  Prior to debridement not able to measure the wound as it is covered mostly with callus, old blistered after debridement measured 2 x 1.5 cm with a depth of 0.2 cm. No pain with calf compression, swelling, warmth, erythema   Assessment: POV #2 s/p left partial 2nd toe amputation; ulceration right foot  Plan: -All treatment options discussed with the patient including all alternatives, risks, complications.  -Left foot appears to have healed well.  Gradual transition to regular shoe as tolerated.  Continue offloading for the other toes to help prevent transfer lesions. -Medically necessary wound debridement was performed on the right foot.  I sharply debrided the ulceration utilizing a #312 with scalpel to debride nonviable, devitalized tissue in order to promote wound healing.  Debridement to healthy, granular tissue.  Not able to measurement prior to debridement as it was covered with callus, blister and  after debridement the measurements are noted above.  I cleaned with saline.  Silvadene applied followed by dressing.  Continue daily dressing changes.  Continue with offloading at all times. -Monitor for any clinical signs or symptoms of infection and directed to call the office immediately should any occur or go to the ER.  Return in about 10 days (around 09/12/2022) for right foot ulcer, x-ray.  Vivi Barrack DPM

## 2022-09-07 ENCOUNTER — Encounter: Payer: Self-pay | Admitting: Podiatry

## 2022-09-09 ENCOUNTER — Ambulatory Visit: Payer: 59 | Admitting: Podiatry

## 2022-09-09 ENCOUNTER — Ambulatory Visit: Payer: 59 | Attending: Cardiology

## 2022-09-14 ENCOUNTER — Ambulatory Visit (INDEPENDENT_AMBULATORY_CARE_PROVIDER_SITE_OTHER): Payer: 59 | Admitting: Podiatry

## 2022-09-14 DIAGNOSIS — E1142 Type 2 diabetes mellitus with diabetic polyneuropathy: Secondary | ICD-10-CM

## 2022-09-14 DIAGNOSIS — L97511 Non-pressure chronic ulcer of other part of right foot limited to breakdown of skin: Secondary | ICD-10-CM

## 2022-09-14 NOTE — Progress Notes (Signed)
Subjective:  Chief Complaint  Patient presents with   Foot Ulcer    PT states she was having a problem with his right foot ulcer he states it is not casuing anymore pain     62 year old male presents for above concerns.  He states the wound on the right foot is doing better.  No drainage or pus.  No swelling or redness.  He is wearing a regular shoe.  She is scheduled back to work later this week.  No fevers or chills.  No other concerns.   Objective: AAO x3, NAD DP/PT pulses palpable bilaterally, CRT less than 3 seconds Right foot: Preulcerative hyperkeratotic lesion with dried blood submetatarsal 1 and upon debridement there is no ongoing ulceration drainage or signs of infection the area is preulcerative still.  No edema, erythema.  No other ulcerations are noted.  No pain with calf compression, swelling, warmth, erythema   Assessment: Preulcerative lesion right foot  Plan: -All treatment options discussed with the patient including all alternatives, risks, complications.  -Debride the hyperkeratotic lesion without any complications or bleeding.  Still on keep a small amount of Silvadene followed by dressing daily.  I did modify his Msir to help take pressure off the area submetatarsal 1.  Discussed break-in instructions.  Daily foot inspection. -Monitor for any clinical signs or symptoms of infection and directed to call the office immediately should any occur or go to the ER.  Return in about 3 weeks (around 10/05/2022).  Vivi Barrack DPM

## 2022-09-17 ENCOUNTER — Encounter: Payer: Self-pay | Admitting: Cardiology

## 2022-10-08 ENCOUNTER — Ambulatory Visit: Payer: 59 | Admitting: Podiatry

## 2022-10-29 ENCOUNTER — Other Ambulatory Visit: Payer: Self-pay | Admitting: Family Medicine

## 2022-12-04 ENCOUNTER — Other Ambulatory Visit: Payer: Self-pay | Admitting: Family Medicine

## 2023-01-17 ENCOUNTER — Ambulatory Visit: Payer: 59 | Attending: Cardiology | Admitting: Cardiology

## 2023-02-17 ENCOUNTER — Other Ambulatory Visit: Payer: Self-pay | Admitting: Podiatry

## 2023-02-18 MED ORDER — DOXYCYCLINE HYCLATE 100 MG PO TABS: 100.0000 mg | ORAL_TABLET | Freq: Two times a day (BID) | ORAL | 0 refills | Status: DC

## 2023-02-21 ENCOUNTER — Encounter: Payer: Self-pay | Admitting: Podiatry

## 2023-02-21 ENCOUNTER — Ambulatory Visit (INDEPENDENT_AMBULATORY_CARE_PROVIDER_SITE_OTHER): Payer: 59 | Admitting: Podiatry

## 2023-02-21 DIAGNOSIS — E1142 Type 2 diabetes mellitus with diabetic polyneuropathy: Secondary | ICD-10-CM | POA: Diagnosis not present

## 2023-02-21 DIAGNOSIS — L97512 Non-pressure chronic ulcer of other part of right foot with fat layer exposed: Secondary | ICD-10-CM

## 2023-02-27 NOTE — Progress Notes (Signed)
 Subjective:  Chief Complaint  Patient presents with   Callouses    Patient states he has a Callouses on right foot , patient is taking antibiotics for infection.        63 year old male presents for above concerns.  He does pick up his antibiotics.  He has noticed the wound has reoccurred on the bottom of the right foot.  He does not report any fevers or chills.  Pain although he does have neuropathy.  No recent treatment otherwise.    Objective: AAO x3, NAD-wife is present DP/PT pulses palpable bilaterally, CRT less than 3 seconds Right foot: Submetatarsal 1 is a thick hyperkeratotic lesion with dried blood.  Upon debridement the ulcer measures about 1 cm in diameter to the level of the fat layer without any probing, amount or tunneling.  Partially able to visualize the wound given the callus formation which was starting to come off which visualized the wound.  There are some macerated tissue in the periwound.  There is no surrounding erythema, ascending cellulitis.  No fluctuation or crepitation.  There is no malodor. No pain with calf compression, swelling, warmth, erythema   Assessment: Recurrent ulceration right foot  Plan: -All treatment options discussed with the patient including all alternatives, risks, complications.  -Medically necessary wound debridements performed today.  Sharply debrided the ulceration with a #312 with scalpel down to healthy, bleeding, granular tissue to remove nonviable, devitalized tissue in order to promote wound healing.  He tolerated the procedure well.  Betadine applied followed by dressing.  Would continue this for now given macerated tissue.  Finish course of antibiotics.  Offloading. -Monitor for any clinical signs or symptoms of infection and directed to call the office immediately should any occur or go to the ER.  Return in about 1 week (around 02/28/2023).  X-ray next appointment  Donnice JONELLE Fees DPM

## 2023-03-01 ENCOUNTER — Ambulatory Visit (INDEPENDENT_AMBULATORY_CARE_PROVIDER_SITE_OTHER): Payer: 59 | Admitting: Podiatry

## 2023-03-01 ENCOUNTER — Ambulatory Visit (INDEPENDENT_AMBULATORY_CARE_PROVIDER_SITE_OTHER): Payer: 59

## 2023-03-01 ENCOUNTER — Encounter: Payer: Self-pay | Admitting: Podiatry

## 2023-03-01 DIAGNOSIS — M778 Other enthesopathies, not elsewhere classified: Secondary | ICD-10-CM | POA: Diagnosis not present

## 2023-03-01 DIAGNOSIS — E1142 Type 2 diabetes mellitus with diabetic polyneuropathy: Secondary | ICD-10-CM

## 2023-03-01 DIAGNOSIS — L97511 Non-pressure chronic ulcer of other part of right foot limited to breakdown of skin: Secondary | ICD-10-CM | POA: Diagnosis not present

## 2023-03-01 NOTE — Progress Notes (Signed)
 Subjective: Chief Complaint  Patient presents with   Foot Ulcer    RM#11 Follow up on right foot ulcer no drainage.    63 y.o. male presents the office today with above concerns.  He is finished his course of antibiotics.  Overall states he is doing better.  Still keeping Silvadene  on the area daily.  No present swelling or redness.  Denies any fevers or chills.  He has no other concerns today.  Objective: AAO x3, NAD DP/PT pulses palpable bilaterally, CRT less than 3 seconds Right foot submetatarsal 1 the hyperkeratotic lesion.  Upon debridement there is small superficial wound present.  There is no surrounding erythema, ascending cellulitis.  No fluctuation, crepitation, malodor.  Clinically there is no signs of infection noted today. No pain with calf compression, swelling, warmth, erythema  Assessment: Ulceration right foot submetatarsal 1  Plan: -All treatment options discussed with the patient including all alternatives, risks, complications.  -X-rays obtained reviewed.  Multiple views were obtained.  There is no cortical changes suggest osteomyelitis.  No soft tissue emphysema.  No evidence of acute fracture. -Medically necessary wound debridements performed today.  Sharply debrided the ulceration, hyperkeratotic tissue #312 with scalpel to help remove nonviable, devitalized tissue in order to clinic wound healing.  Minimal bleeding occurred.  Hemostasis achieved and manual compression.  There is a superficial wound still present measuring 0.5 x 0.3 x 0.1 cm.  Not able measurement prior to debridement as it was covered with callus.  Silvadene  was applied followed by dressing.  Continue daily dressing changes. -Offloading-he was seen today by Sueanne for modification of his inserts and also regimen for new orthotics. -Patient encouraged to call the office with any questions, concerns, change in symptoms.    Donnice JONELLE Fees DPM

## 2023-03-01 NOTE — Patient Instructions (Signed)
 Monitor for any signs/symptoms of infection. Call the office immediately if any occur or go directly to the emergency room. Call with any questions/concerns.

## 2023-03-10 ENCOUNTER — Ambulatory Visit: Payer: 59 | Admitting: Podiatry

## 2023-03-13 ENCOUNTER — Other Ambulatory Visit: Payer: Self-pay | Admitting: Family Medicine

## 2023-03-15 ENCOUNTER — Ambulatory Visit (INDEPENDENT_AMBULATORY_CARE_PROVIDER_SITE_OTHER): Payer: 59 | Admitting: Podiatry

## 2023-03-15 ENCOUNTER — Encounter: Payer: Self-pay | Admitting: Podiatry

## 2023-03-15 DIAGNOSIS — L84 Corns and callosities: Secondary | ICD-10-CM

## 2023-03-15 DIAGNOSIS — E1142 Type 2 diabetes mellitus with diabetic polyneuropathy: Secondary | ICD-10-CM

## 2023-03-15 DIAGNOSIS — L97511 Non-pressure chronic ulcer of other part of right foot limited to breakdown of skin: Secondary | ICD-10-CM | POA: Diagnosis not present

## 2023-03-15 NOTE — Progress Notes (Signed)
Subjective: Chief Complaint  Patient presents with   Foot Ulcer    RM#13 Right foot ulcer patient states is doing good no pain or drainage using silvadene as directed.     63 y.o. male presents the office today with above concerns.  States he is doing good.  His wife will continue with putting Silvadene on the wound followed by a bandage.  Does not see any drainage or pus or increasing swelling or redness.  He does not report any fevers or chills.  No new ulcerations.    Objective: AAO x3, NAD DP/PT pulses palpable bilaterally, CRT less than 3 seconds Right foot submetatarsal 1 the hyperkeratotic lesion.  There are some dried blood present under the callus.  Upon debridement the area is preulcerative but no significant breakdown noted.  There is no bleeding during the debridement present.  There is no erythema, ascending cellulitis.  No fluctuance or crepitation.  No malodor. On the left foot there is a hyperkeratotic lesion submetatarsal 1 without any underlying ulceration, drainage or signs of infection. No pain with calf compression, swelling, warmth, erythema  Assessment: Ulceration right foot submetatarsal 1  Plan: -All treatment options discussed with the patient including all alternatives, risks, complications.  -Separate debrided the preulcerative callus on the right foot without any complications or bleeding.  I still keep a small dressing on the area daily. -Debrided the callus on the left foot submetatarsal 1 without any complications or bleeding. -Orthotics were dispensed today.  Oral and written break-in instructions provided. -Daily foot inspection   Vivi Barrack DPM

## 2023-04-26 ENCOUNTER — Ambulatory Visit: Payer: 59 | Admitting: Podiatry

## 2023-05-02 ENCOUNTER — Encounter: Payer: Self-pay | Admitting: Podiatry

## 2023-05-03 ENCOUNTER — Other Ambulatory Visit: Payer: Self-pay | Admitting: Podiatry

## 2023-05-03 MED ORDER — SILVER SULFADIAZINE 1 % EX CREA: 1.0000 | TOPICAL_CREAM | Freq: Every day | CUTANEOUS | 0 refills | Status: AC

## 2023-05-03 MED ORDER — DOXYCYCLINE HYCLATE 100 MG PO TABS: 100.0000 mg | ORAL_TABLET | Freq: Two times a day (BID) | ORAL | 0 refills | Status: DC

## 2023-05-06 ENCOUNTER — Ambulatory Visit (INDEPENDENT_AMBULATORY_CARE_PROVIDER_SITE_OTHER): Admitting: Podiatry

## 2023-05-06 ENCOUNTER — Encounter: Payer: Self-pay | Admitting: Podiatry

## 2023-05-06 DIAGNOSIS — E11621 Type 2 diabetes mellitus with foot ulcer: Secondary | ICD-10-CM | POA: Diagnosis not present

## 2023-05-06 DIAGNOSIS — L97412 Non-pressure chronic ulcer of right heel and midfoot with fat layer exposed: Secondary | ICD-10-CM | POA: Diagnosis not present

## 2023-05-08 NOTE — Progress Notes (Signed)
 Subjective: Chief Complaint  Patient presents with   Foot Ulcer    RM#14 bottom of right foot ulcer follow up still draining at times.    63 y.o. male presents the office today with above concerns.  He states the wound did heal but unfortunately is open back up.  He gets some bloody drainage but no purulence.  Increased swelling or redness.  Does not report any fevers or chills.  No pain although he does have neuropathy.  He has no other concerns today.  He did start his antibiotics on Tuesday.  He does state that work for about 10 hours a day which is likely exacerbating his symptoms.    Objective: AAO x3, NAD DP/PT pulses palpable bilaterally, CRT less than 3 seconds Right foot submetatarsal 1 the hyperkeratotic lesion.  Upon debridement the wound was small measuring 0.5 x 0.4 x 0.2 cm but there is thick hyperkeratotic tissue.  Once I debrided this area the wound was larger measuring 1.5 x 1 x 0.3 cm.  Granulation tissue noted with macerated periwound.  There is no probing, undermining or tunneling.  There is no fluctuation or crepitation.  There is no malodor. No pain with calf compression, erythema, warmth.     Assessment: Ulceration right foot submetatarsal 1  Plan: -All treatment options discussed with the patient including all alternatives, risks, complications.  -Medically necessary wound debridement was performed today.  Sharply debrided the ulcer with a #312 with scalpel to debride nonviable, devitalized tissue in order to promote wound healing.  Pre and post 2 measurements are noted above.  Minimal bleeding.  Hemostasis achieved and manual compression.  Cleaned the area with saline.  Silvadene applied followed by dressing.  Continue daily dressing changes, offloading.  Tolerated well. -Finish course of antibiotics.  -He has inserts with modifications to help offload.  Will likely try to adjust this as well to see if there is anything else we can do to try to help minimize  pressure.  I will see him back in 2 weeks at that time also with Bethann Berkshire, pedorthist.  Vivi Barrack DPM

## 2023-05-19 ENCOUNTER — Ambulatory Visit: Admitting: Podiatry

## 2023-06-02 ENCOUNTER — Ambulatory Visit: Admitting: Podiatry

## 2023-06-09 ENCOUNTER — Ambulatory Visit: Admitting: Podiatry

## 2023-07-07 ENCOUNTER — Ambulatory Visit (INDEPENDENT_AMBULATORY_CARE_PROVIDER_SITE_OTHER): Admitting: Podiatry

## 2023-07-07 ENCOUNTER — Encounter: Payer: Self-pay | Admitting: Podiatry

## 2023-07-07 DIAGNOSIS — L84 Corns and callosities: Secondary | ICD-10-CM

## 2023-07-07 DIAGNOSIS — E1142 Type 2 diabetes mellitus with diabetic polyneuropathy: Secondary | ICD-10-CM

## 2023-07-07 NOTE — Progress Notes (Signed)
  Subjective:  Patient ID: Roberto Allen, male    DOB: 1960/04/06,  MRN: [id number]  Chief Complaint  Patient presents with   Foot Ulcer    F/U ulcer right foot plantar. IDDM A1C 7.1. 0 pain.    Discussed the use of AI scribe software for clinical note transcription with the patient, who gave verbal consent to proceed.  History of Present Illness Roberto Allen is a 63 year old male who presents for follow-up of foot care.  He has been using new shoes, which have been beneficial for his foot condition. There is no drainage, pus, swelling, or redness. Approximately three weeks ago, he noticed a hard spot on his foot, which was removed without bleeding. No fevers or chills. No other concerns today.       Objective:    Physical Exam General: AAO x3, NAD  Dermatological: On the right foot submetatarsal 1 is a thick hyperkeratotic lesion with dried blood.  Upon debridement there is no underlying ulceration, drainage or any signs of infection.   Vascular: Dorsalis Pedis artery and Posterior Tibial artery pedal pulses are 2/4 bilateral with immedate capillary fill time. There is no pain with calf compression, swelling, warmth, erythema.   Neruologic: Sensation decreased  Musculoskeletal: Prominent metatarsal head. No pain.         Results    Assessment:   1. Pre-ulcerative calluses   2. Type 2 diabetes mellitus with diabetic polyneuropathy, without long-term current use of insulin  [facility])      Plan:  Patient was evaluated and treated and all questions answered.  Assessment and Plan Assessment & Plan Callus on foot with history of prior ulceration - Sharply debrided preulcerative callus with any complications or bleeding.  Continue moisturizer, offloading.  Continue shoes with good support and offloading measures. - Continue using moisturizer on the foot. - Daily foot inspection with glucose control.   Return in about 2 months (around 09/06/2023) for pre-ulcerative  callus.   Charity Conch DPM

## 2023-07-21 ENCOUNTER — Ambulatory Visit: Admitting: Dietician

## 2023-09-08 ENCOUNTER — Ambulatory Visit: Payer: Self-pay | Admitting: Podiatry

## 2023-09-29 ENCOUNTER — Encounter (HOSPITAL_COMMUNITY): Payer: Self-pay | Admitting: Emergency Medicine

## 2023-09-29 ENCOUNTER — Other Ambulatory Visit: Payer: Self-pay

## 2023-09-29 ENCOUNTER — Inpatient Hospital Stay (HOSPITAL_COMMUNITY)
Admission: EM | Admit: 2023-09-29 | Discharge: 2023-09-30 | DRG: 623 | Disposition: A | Discharge status: Home / Self Care | Attending: Internal Medicine | Admitting: Internal Medicine

## 2023-09-29 ENCOUNTER — Emergency Department (HOSPITAL_COMMUNITY)

## 2023-09-29 DIAGNOSIS — D631 Anemia in chronic kidney disease: Secondary | ICD-10-CM | POA: Diagnosis present

## 2023-09-29 DIAGNOSIS — I428 Other cardiomyopathies: Secondary | ICD-10-CM | POA: Diagnosis present

## 2023-09-29 DIAGNOSIS — Z87891 Personal history of nicotine dependence: Secondary | ICD-10-CM | POA: Diagnosis not present

## 2023-09-29 DIAGNOSIS — L97522 Non-pressure chronic ulcer of other part of left foot with fat layer exposed: Secondary | ICD-10-CM | POA: Diagnosis present

## 2023-09-29 DIAGNOSIS — Z7984 Long term (current) use of oral hypoglycemic drugs: Secondary | ICD-10-CM

## 2023-09-29 DIAGNOSIS — N184 Chronic kidney disease, stage 4 (severe): Secondary | ICD-10-CM | POA: Diagnosis present

## 2023-09-29 DIAGNOSIS — I509 Heart failure, unspecified: Secondary | ICD-10-CM

## 2023-09-29 DIAGNOSIS — E1122 Type 2 diabetes mellitus with diabetic chronic kidney disease: Secondary | ICD-10-CM | POA: Diagnosis present

## 2023-09-29 DIAGNOSIS — I10 Essential (primary) hypertension: Secondary | ICD-10-CM | POA: Diagnosis not present

## 2023-09-29 DIAGNOSIS — E11621 Type 2 diabetes mellitus with foot ulcer: Principal | ICD-10-CM | POA: Diagnosis present

## 2023-09-29 DIAGNOSIS — Z794 Long term (current) use of insulin: Secondary | ICD-10-CM | POA: Diagnosis not present

## 2023-09-29 DIAGNOSIS — Z7985 Long-term (current) use of injectable non-insulin antidiabetic drugs: Secondary | ICD-10-CM

## 2023-09-29 DIAGNOSIS — Z79899 Other long term (current) drug therapy: Secondary | ICD-10-CM

## 2023-09-29 DIAGNOSIS — I5022 Chronic systolic (congestive) heart failure: Secondary | ICD-10-CM

## 2023-09-29 DIAGNOSIS — Z7982 Long term (current) use of aspirin: Secondary | ICD-10-CM | POA: Diagnosis not present

## 2023-09-29 DIAGNOSIS — L02611 Cutaneous abscess of right foot: Secondary | ICD-10-CM | POA: Diagnosis present

## 2023-09-29 DIAGNOSIS — D649 Anemia, unspecified: Secondary | ICD-10-CM | POA: Diagnosis present

## 2023-09-29 DIAGNOSIS — I130 Hypertensive heart and chronic kidney disease with heart failure and stage 1 through stage 4 chronic kidney disease, or unspecified chronic kidney disease: Secondary | ICD-10-CM | POA: Diagnosis present

## 2023-09-29 DIAGNOSIS — E1121 Type 2 diabetes mellitus with diabetic nephropathy: Secondary | ICD-10-CM | POA: Diagnosis not present

## 2023-09-29 DIAGNOSIS — I2510 Atherosclerotic heart disease of native coronary artery without angina pectoris: Secondary | ICD-10-CM | POA: Diagnosis present

## 2023-09-29 DIAGNOSIS — T148XXA Other injury of unspecified body region, initial encounter: Secondary | ICD-10-CM

## 2023-09-29 DIAGNOSIS — L97519 Non-pressure chronic ulcer of other part of right foot with unspecified severity: Secondary | ICD-10-CM | POA: Diagnosis not present

## 2023-09-29 DIAGNOSIS — E781 Pure hyperglyceridemia: Secondary | ICD-10-CM | POA: Diagnosis present

## 2023-09-29 DIAGNOSIS — L089 Local infection of the skin and subcutaneous tissue, unspecified: Principal | ICD-10-CM | POA: Diagnosis present

## 2023-09-29 DIAGNOSIS — E1140 Type 2 diabetes mellitus with diabetic neuropathy, unspecified: Secondary | ICD-10-CM | POA: Diagnosis present

## 2023-09-29 DIAGNOSIS — E1129 Type 2 diabetes mellitus with other diabetic kidney complication: Secondary | ICD-10-CM | POA: Diagnosis present

## 2023-09-29 DIAGNOSIS — J449 Chronic obstructive pulmonary disease, unspecified: Secondary | ICD-10-CM | POA: Diagnosis present

## 2023-09-29 DIAGNOSIS — L97512 Non-pressure chronic ulcer of other part of right foot with fat layer exposed: Secondary | ICD-10-CM | POA: Diagnosis present

## 2023-09-29 DIAGNOSIS — E785 Hyperlipidemia, unspecified: Secondary | ICD-10-CM

## 2023-09-29 DIAGNOSIS — E1169 Type 2 diabetes mellitus with other specified complication: Secondary | ICD-10-CM | POA: Diagnosis present

## 2023-09-29 DIAGNOSIS — Z89422 Acquired absence of other left toe(s): Secondary | ICD-10-CM

## 2023-09-29 DIAGNOSIS — Z833 Family history of diabetes mellitus: Secondary | ICD-10-CM | POA: Diagnosis not present

## 2023-09-29 DIAGNOSIS — Z888 Allergy status to other drugs, medicaments and biological substances status: Secondary | ICD-10-CM

## 2023-09-29 DIAGNOSIS — N183 Chronic kidney disease, stage 3 unspecified: Secondary | ICD-10-CM

## 2023-09-29 DIAGNOSIS — E876 Hypokalemia: Secondary | ICD-10-CM | POA: Diagnosis present

## 2023-09-29 DIAGNOSIS — N179 Acute kidney failure, unspecified: Secondary | ICD-10-CM | POA: Diagnosis present

## 2023-09-29 LAB — CK: Total CK: 462 U/L — ABNORMAL HIGH (ref 49–397)

## 2023-09-29 LAB — COMPREHENSIVE METABOLIC PANEL WITH GFR
ALT: 18 U/L (ref 0–44)
AST: 19 U/L (ref 15–41)
Albumin: 3.3 g/dL — ABNORMAL LOW (ref 3.5–5.0)
Alkaline Phosphatase: 121 U/L (ref 38–126)
Anion gap: 12 (ref 5–15)
BUN: 44 mg/dL — ABNORMAL HIGH (ref 8–23)
CO2: 26 mmol/L (ref 22–32)
Calcium: 8.8 mg/dL — ABNORMAL LOW (ref 8.9–10.3)
Chloride: 100 mmol/L (ref 98–111)
Creatinine, Ser: 3.64 mg/dL — ABNORMAL HIGH (ref 0.61–1.24)
GFR, Estimated: 18 mL/min — ABNORMAL LOW (ref >60)
Glucose, Bld: 274 mg/dL — ABNORMAL HIGH (ref 70–99)
Potassium: 2.9 mmol/L — ABNORMAL LOW (ref 3.5–5.1)
Sodium: 138 mmol/L (ref 135–145)
Total Bilirubin: 0.8 mg/dL (ref 0.0–1.2)
Total Protein: 6.7 g/dL (ref 6.5–8.1)

## 2023-09-29 LAB — CBC
HCT: 29.4 % — ABNORMAL LOW (ref 39.0–52.0)
Hemoglobin: 9.4 g/dL — ABNORMAL LOW (ref 13.0–17.0)
MCH: 26.9 pg (ref 26.0–34.0)
MCHC: 32 g/dL (ref 30.0–36.0)
MCV: 84.2 fL (ref 80.0–100.0)
Platelets: 295 K/uL (ref 150–400)
RBC: 3.49 MIL/uL — ABNORMAL LOW (ref 4.22–5.81)
RDW: 15.4 % (ref 11.5–15.5)
WBC: 9.4 K/uL (ref 4.0–10.5)
nRBC: 0 % (ref 0.0–0.2)

## 2023-09-29 LAB — IRON AND TIBC
Iron: 27 ug/dL — ABNORMAL LOW (ref 45–182)
Saturation Ratios: 7 % — ABNORMAL LOW (ref 17.9–39.5)
TIBC: 419 ug/dL (ref 250–450)
UIBC: 392 ug/dL

## 2023-09-29 LAB — CREATININE, URINE, RANDOM: Creatinine, Urine: 99 mg/dL

## 2023-09-29 LAB — FERRITIN: Ferritin: 14 ng/mL — ABNORMAL LOW (ref 24–336)

## 2023-09-29 LAB — BASIC METABOLIC PANEL WITH GFR
Anion gap: 12 (ref 5–15)
BUN: 45 mg/dL — ABNORMAL HIGH (ref 8–23)
CO2: 28 mmol/L (ref 22–32)
Calcium: 9.2 mg/dL (ref 8.9–10.3)
Chloride: 101 mmol/L (ref 98–111)
Creatinine, Ser: 3.47 mg/dL — ABNORMAL HIGH (ref 0.61–1.24)
GFR, Estimated: 19 mL/min — ABNORMAL LOW (ref >60)
Glucose, Bld: 99 mg/dL (ref 70–99)
Potassium: 3.4 mmol/L — ABNORMAL LOW (ref 3.5–5.1)
Sodium: 141 mmol/L (ref 135–145)

## 2023-09-29 LAB — OSMOLALITY, URINE: Osmolality, Ur: 441 mosm/kg (ref 300–900)

## 2023-09-29 LAB — RETICULOCYTES
Immature Retic Fract: 24.2 % — ABNORMAL HIGH (ref 2.3–15.9)
RBC.: 3.94 MIL/uL — ABNORMAL LOW (ref 4.22–5.81)
Retic Count, Absolute: 74.5 K/uL (ref 19.0–186.0)
Retic Ct Pct: 1.9 % (ref 0.4–3.1)

## 2023-09-29 LAB — MAGNESIUM: Magnesium: 2.6 mg/dL — ABNORMAL HIGH (ref 1.7–2.4)

## 2023-09-29 LAB — SODIUM, URINE, RANDOM: Sodium, Ur: 65 mmol/L

## 2023-09-29 LAB — GLUCOSE, CAPILLARY: Glucose-Capillary: 193 mg/dL — ABNORMAL HIGH (ref 70–99)

## 2023-09-29 LAB — SEDIMENTATION RATE: Sed Rate: 35 mm/h — ABNORMAL HIGH (ref 0–16)

## 2023-09-29 LAB — OSMOLALITY: Osmolality: 319 mosm/kg — ABNORMAL HIGH (ref 275–295)

## 2023-09-29 LAB — PHOSPHORUS: Phosphorus: 3.4 mg/dL (ref 2.5–4.6)

## 2023-09-29 LAB — I-STAT CG4 LACTIC ACID, ED: Lactic Acid, Venous: 1.5 mmol/L (ref 0.5–1.9)

## 2023-09-29 MED ORDER — LINEZOLID 600 MG/300ML IV SOLN
600.0000 mg | Freq: Two times a day (BID) | INTRAVENOUS | Status: DC
  Administered 2023-09-29 – 2023-09-30 (×2): 600 mg via INTRAVENOUS
  Filled 2023-09-29 (×4): qty 300

## 2023-09-29 MED ORDER — ASPIRIN 81 MG PO TBEC: 81.0000 mg | DELAYED_RELEASE_TABLET | Freq: Every day | ORAL | Status: DC

## 2023-09-29 MED ORDER — SODIUM CHLORIDE 0.9 % IV SOLN
3.0000 g | Freq: Two times a day (BID) | INTRAVENOUS | Status: DC
  Administered 2023-09-29 – 2023-09-30 (×2): 3 g via INTRAVENOUS
  Filled 2023-09-29: qty 8

## 2023-09-29 MED ORDER — POTASSIUM CHLORIDE CRYS ER 20 MEQ PO TBCR
40.0000 meq | EXTENDED_RELEASE_TABLET | Freq: Once | ORAL | Status: AC
  Administered 2023-09-29: 40 meq via ORAL
  Filled 2023-09-29: qty 2

## 2023-09-29 MED ORDER — SODIUM CHLORIDE 0.9 % IV SOLN
3.0000 g | Freq: Once | INTRAVENOUS | Status: DC
  Filled 2023-09-29: qty 8

## 2023-09-29 MED ORDER — INSULIN ASPART 100 UNIT/ML IJ SOLN
0.0000 [IU] | INTRAMUSCULAR | Status: DC
  Administered 2023-09-30: 2 [IU] via SUBCUTANEOUS

## 2023-09-29 NOTE — ED Notes (Signed)
 Pt family member to waiting room desk. Pt requesting to speak to charge RN. Message sent to [place], Consulting civil engineer.

## 2023-09-29 NOTE — Assessment & Plan Note (Signed)
 Continue Lipitor  80 mg daily

## 2023-09-29 NOTE — ED Notes (Signed)
 Pt family member at desk again. Process of triage explained to patient.

## 2023-09-29 NOTE — ED Notes (Signed)
 Off unit lab will return @20 :44

## 2023-09-29 NOTE — Assessment & Plan Note (Addendum)
 Gently rehydrate continue to follow order urine electrolytes Hold diuretics for tonight

## 2023-09-29 NOTE — Assessment & Plan Note (Signed)
 Chronic stable continue Lipitor  80 mg daily continue Coreg  12.5 mg p.o. daily

## 2023-09-29 NOTE — Assessment & Plan Note (Signed)
 Avoid over aggressive rehydration monitor closely fluid status

## 2023-09-29 NOTE — Subjective & Objective (Signed)
 Patient presents to emergency department for wound check on right foot the wound has been present for the past week Patient is diabetic usually gets his care from [place] He has longstanding nonhealing wound of his right medial distal foot has been having foul order Had some x-rays done at urgent care and blood work and was told to go to emergency department where he was promised he would get admitted for IV antibiotics Denies any fevers or chills Patient uses insulin  for his diabetes

## 2023-09-29 NOTE — ED Notes (Addendum)
 Spoke with the patient and his wife.  They are extremely upset they have been waiting in the lobby all day.  They stated [facility].  They stated they were told it was an emergency and expected to have this done immediately.  Explained the process and they were not satisfied. Explained to them I follow up with charge RN to see what we could do to assist with facilitating his treatment.   Spoke to MSE, PS Prosperi,who spoke with them extensively about the issue, treatment and the process.  He states he also explained the process and situation.

## 2023-09-29 NOTE — H&P (Signed)
 Roberto Allen:[id number] DOB: 01/26/1961 DOA: 09/29/2023     PCP: Cesario Friend, MD  [facility]:   CARDS:  Dr. Redell Cave, MD  Podiatry dr. Gershon GI* Dr.  Gwen, LB) No care team member to display    Patient arrived to ER on 09/29/23 at 1127 Referred by Attending Garrick Charleston, MD   Patient coming from:    home Lives   With family     Chief Complaint:   Chief Complaint  Patient presents with   Wound Infection    HPI: Roberto Allen is a 63 y.o. male with medical history significant of Dm2, CKD, HTN, HLD, CAD    Presented with right foot wound Patient presents to emergency department for wound check on right foot the wound has been present for the past week Patient is diabetic usually gets his care from [place] He has longstanding nonhealing wound of his right medial distal foot has been having foul order Had some x-rays done at urgent care and blood work and was told to go to emergency department where he was promised he would get admitted for IV antibiotics Denies any fevers or chills Patient uses insulin  for his diabetes    No fever no chills, no Cp no SOB  Denies significant ETOH intake   Does not smoke       Regarding pertinent Chronic problems:  Hyperlipidemia - on statins Lipitor  (atorvastatin )  Lipid Panel     Component Value Date/Time   CHOL 261 (H) 11/28/2020 0844   TRIG (H) 11/28/2020 0844    507.0 Triglyceride is over 400; calculations on Lipids are invalid.   HDL 42.10 11/28/2020 0844   CHOLHDL 6 11/28/2020 0844   VLDL UNABLE TO CALCULATE IF TRIGLYCERIDE OVER 400 mg/dL 88/97/7985 9854   LDLCALC UNABLE TO CALCULATE IF TRIGLYCERIDE OVER 400 mg/dL 88/97/7985 9854   LDLDIRECT 120.0 11/28/2020 0844    HTN on coreg , hydralazine , imdur , torsemide ,     chronic CHF diastolic/systolic/ combined - last echo  Recent Results (from the past 56199 hours)  ECHOCARDIOGRAM COMPLETE   Collection Time: 12/16/20  1:57 PM   Result Value   BP 135/72   Ao pk vel 0.89   AV Area VTI 2.51   AR max vel 2.16   AV Mean grad 2.0   AV Peak grad 3.2   Single Plane A2C EF 42.5   Single Plane A4C EF 46.5   Calc EF 46.9   S' Lateral 3.66   AV Area mean vel 2.25   Area-P 1/2 5.23   Narrative      ECHOCARDIOGRAM REPORT       1. Left ventricular ejection fraction, by estimation, is 40 to 45%. The left ventricle has mildly decreased function. The left ventricle demonstrates global hypokinesis. There is more severe hypokinesis of the basal inferior/inferolateral segments.  There is moderate left ventricular hypertrophy. Left ventricular diastolic parameters are consistent with Grade I diastolic dysfunction (impaired relaxation).  2. Right ventricular systolic function is normal. The right ventricular size is mildly enlarged. Tricuspid regurgitation signal is inadequate for assessing PA pressure.  3. Left atrial size was mildly dilated.  4. Right atrial size was moderately dilated.  5. The mitral valve is normal in structure. Trivial mitral valve regurgitation. No evidence of mitral stenosis.  6. The aortic valve has an indeterminant number of cusps. Aortic valve regurgitation is not visualized. No aortic stenosis is present.           CAD  -  On Aspirin , statin, betablocker,                  -  followed by cardiology                  DM 2 -  Lab Results  Component Value Date   HGBA1C 8.3 (H) 03/19/2022   on insulin     obesity-   BMI Readings from Last 1 Encounters:  07/15/22 31.16 kg/m      CKD stage IV   baseline Cr 2.5 CrCl cannot be calculated (Unknown ideal weight.).  Lab Results  Component Value Date   CREATININE 3.64 (H) 09/29/2023   CREATININE 2.43 (H) 03/22/2022   CREATININE 2.79 (H) 03/21/2022   Lab Results  Component Value Date   NA 138 09/29/2023   CL 100 09/29/2023   K 2.9 (L) 09/29/2023   CO2 26 09/29/2023   BUN 44 (H) 09/29/2023   CREATININE 3.64 (H) 09/29/2023   GFRNONAA 18 (L)  09/29/2023   CALCIUM  8.8 (L) 09/29/2023   PHOS 3.4 03/22/2022   ALBUMIN 3.3 (L) 09/29/2023   GLUCOSE 274 (H) 09/29/2023       Chronic anemia - baseline hg Hemoglobin & Hematocrit  Recent Labs    09/29/23 1225  HGB 9.4*   Iron/TIBC/Ferritin/ %Sat No results found for: IRON, TIBC, FERRITIN, IRONPCTSAT    While in ER:     Started on Unasyn  and Linezolid     Lab Orders         Blood culture (routine x 2)         CBC         Comprehensive metabolic panel     Right foot No fracture, bone lesion or evidence of osteomyelitis.   Following Medications were ordered in ER: Medications  potassium chloride  SA (KLOR-CON  M) CR tablet 40 mEq (has no administration in time range)  Ampicillin -Sulbactam (UNASYN ) 3 g in sodium chloride  0.9 % 100 mL IVPB (has no administration in time range)  linezolid  (ZYVOX ) IVPB 600 mg (has no administration in time range)       ED Triage Vitals  Encounter Vitals Group     BP 09/29/23 1131 139/77     Girls Systolic BP Percentile --      Girls Diastolic BP Percentile --      Boys Systolic BP Percentile --      Boys Diastolic BP Percentile --      Pulse Rate 09/29/23 1131 77     Resp 09/29/23 1131 18     Temp 09/29/23 1131 98.1 F (36.7 C)     Temp Source 09/29/23 1131 Oral     SpO2 09/29/23 1131 95 %     Weight --      Height --      Head Circumference --      Peak Flow --      Pain Score 09/29/23 1238 4     Pain Loc --      Pain Education --      Exclude from Growth Chart --   UFJK(75)@     _________________________________________ Significant initial  Findings: Abnormal Labs Reviewed  CBC - Abnormal; Notable for the following components:      Result Value   RBC 3.49 (*)    Hemoglobin 9.4 (*)    HCT 29.4 (*)    All other components within normal limits  COMPREHENSIVE METABOLIC PANEL WITH GFR - Abnormal; Notable for the following components:   Potassium 2.9 (*)  Glucose, Bld 274 (*)    BUN 44 (*)    Creatinine, Ser 3.64 (*)     Calcium  8.8 (*)    Albumin 3.3 (*)    GFR, Estimated 18 (*)    All other components within normal limits     ECG: Ordered Personally reviewed and interpreted by me showing: HR : 70 Rhythm: Sinus rhythm Short PR interval Nonspecific intraventricular conduction delay Borderline repolarization abnormality QTC 463   The recent clinical data is shown below. Vitals:   09/29/23 1131 09/29/23 1743  BP: 139/77 (!) 155/87  Pulse: 77 66  Resp: 18 14  Temp: 98.1 F (36.7 C) 98.3 F (36.8 C)  TempSrc: Oral Oral  SpO2: 95% 97%    WBC     Component Value Date/Time   WBC 9.4 09/29/2023 1225   LYMPHSABS 1.7 03/22/2022 0459   MONOABS 0.7 03/22/2022 0459   EOSABS 0.2 03/22/2022 0459   BASOSABS 0.1 03/22/2022 0459      ABX started unasyn / linezolid  __________________________________________________________ Recent Labs  Lab 09/29/23 1225  NA 138  K 2.9*  CO2 26  GLUCOSE 274*  BUN 44*  CREATININE 3.64*  CALCIUM  8.8*    Cr  Up from baseline see below Lab Results  Component Value Date   CREATININE 3.64 (H) 09/29/2023   CREATININE 2.43 (H) 03/22/2022   CREATININE 2.79 (H) 03/21/2022    Recent Labs  Lab 09/29/23 1225  AST 19  ALT 18  ALKPHOS 121  BILITOT 0.8  PROT 6.7  ALBUMIN 3.3*   Lab Results  Component Value Date   CALCIUM  8.8 (L) 09/29/2023   PHOS 3.4 03/22/2022       Plt: Lab Results  Component Value Date   PLT 295 09/29/2023    Recent Labs  Lab 09/29/23 1225  WBC 9.4  HGB 9.4*  HCT 29.4*  MCV 84.2  PLT 295    HG/HCT   stable,     Component Value Date/Time   HGB 9.4 (L) 09/29/2023 1225   HCT 29.4 (L) 09/29/2023 1225   MCV 84.2 09/29/2023 1225     _______________________________________________ Hospitalist was called for admission for wound infection   The following Work up has been ordered so far:  Orders Placed This Encounter  Procedures   Blood culture (routine x 2)   DG Foot Complete Right   MR FOOT RIGHT WO CONTRAST    CBC   Comprehensive metabolic panel   Consult for Unassigned Medical Admission     OTHER Significant initial  Findings:  labs showing:     DM  labs:  HbA1C: No results for input(s): HGBA1C in the last 8760 hours.     CBG (last 3)  No results for input(s): GLUCAP in the last 72 hours.        Cultures:    Component Value Date/Time   SDES  03/18/2022 2158    BLOOD LEFT HAND Performed at [facility], [address] [place], [place] [address]    SPECREQUEST  03/18/2022 2158    BOTTLES DRAWN AEROBIC AND ANAEROBIC Blood Culture adequate volume Performed at [facility], [address], [place], [place] [address]    CULT  03/18/2022 2158    NO GROWTH 5 DAYS Performed at [facility], [address], [place], [place] [address]    REPTSTATUS 03/23/2022 FINAL 03/18/2022 2158     Radiological Exams on Admission: DG Foot Complete Right Result Date: 09/29/2023 CLINICAL DATA:  Foot wound.  EXAM: RIGHT FOOT COMPLETE - 3+ VIEW COMPARISON:  03/01/2023. FINDINGS: No fracture.  No bone lesion. There are no areas of bone resorption to suggest osteomyelitis. Previous resection of the distal second toe at the D IP joint. Joints are normally spaced and aligned. No significant arthropathic changes. Small dorsal and plantar calcaneal spurs. Small amount of soft tissue air is noted medial to first metatarsophalangeal joint. There is associated soft tissue swelling with more diffuse soft tissue swelling along the forefoot. IMPRESSION: 1. No fracture, bone lesion or evidence of osteomyelitis. Electronically Signed   By: Alm Parkins M.D.   On: 09/29/2023 13:13   _______________________________________________________________________________________________________ Latest  Blood pressure (!) 155/87, pulse 66, temperature 98.3 F (36.8 C), temperature source Oral, resp. rate 14, SpO2 97%.   Vitals  labs and radiology finding personally reviewed  Review  of Systems:    Pertinent positives include: foul smell from the wound  Constitutional:  No weight loss, night sweats, Fevers, chills, fatigue, weight loss  HEENT:  No headaches, Difficulty swallowing,Tooth/dental problems,Sore throat,  No sneezing, itching, ear ache, nasal congestion, post nasal drip,  Cardio-vascular:  No chest pain, Orthopnea, PND, anasarca, dizziness, palpitations.no Bilateral lower extremity swelling  GI:  No heartburn, indigestion, abdominal pain, nausea, vomiting, diarrhea, change in bowel habits, loss of appetite, melena, blood in stool, hematemesis Resp:  no shortness of breath at rest. No dyspnea on exertion, No excess mucus, no productive cough, No non-productive cough, No coughing up of blood.No change in color of mucus.No wheezing. Skin:  no rash or lesions. No jaundice GU:  no dysuria, change in color of urine, no urgency or frequency. No straining to urinate.  No flank pain.  Musculoskeletal:  No joint pain or no joint swelling. No decreased range of motion. No back pain.  Psych:  No change in mood or affect. No depression or anxiety. No memory loss.  Neuro: no localizing neurological complaints, no tingling, no weakness, no double vision, no gait abnormality, no slurred speech, no confusion  All systems reviewed and apart from HOPI all are negative _______________________________________________________________________________________________ Past Medical History:   Past Medical History:  Diagnosis Date   CAD (coronary artery disease)    a. LHC 4/13: mD1 25%, oD2 25%, pOM1 25%, pRCA 25%, dRCA 40%, EF 30%   CHF (congestive heart failure) (HCC)    Chronic kidney disease (CKD), stage III (moderate) (HCC)    Chronic systolic heart failure (HCC)    a. EF 30-35% in 04/2011, improved to 55-60% in 01/2012.   COPD (chronic obstructive pulmonary disease) (HCC)    Diabetes mellitus    GERD (gastroesophageal reflux disease)    History of pneumonia     Hyperkalemia    a. 12/2012 - spironolactone , KCl supp discontinued.   Hyperlipidemia    Hypertension    Hypertriglyceridemia    a. 12/2012 - started on fenofibrate .   NICM (nonischemic cardiomyopathy) (HCC)    a. Echo 3/13: mild LVH, EF 30-35%, grade 3 diast dysfxn, mod LAE;  RHC 4/13:  RA 8, RV 58/10, PA 56/18, mean 35, PCWP mean 17, LV 154/27, CO 4.3, CI 2.0. b. EF Improved >>> Echo (01/2012):  Mild LVH, EF 55-60%, no RWMA, Gr 1 DD      Past Surgical History:  Procedure Laterality Date   AMPUTATION TOE Left 03/21/2022   Procedure: AMPUTATION TOE;  Surgeon: Tobie Franky SQUIBB, DPM;  Location: [facility];  Service: Podiatry;  Laterality: Left;   AMPUTATION TOE Left 07/14/2022   tip of left  2nd toe   CARDIAC CATHETERIZATION  2013   non-obs dz 25-40% multiple vessels   TENOTOMY  2018    Social History:  Ambulatory   independently      reports that he quit smoking about 17 years ago. His smoking use included cigarettes. He started smoking about 37 years ago. He has a 20 pack-year smoking history. He has never used smokeless tobacco. He reports that he does not currently use alcohol. He reports that he does not use drugs.     Family History:   Family History  Problem Relation Age of Onset   Diabetes Sister    Breast cancer Mother    Breast cancer Maternal Grandmother    ______________________________________________________________________________________________ Allergies: Allergies  Allergen Reactions   Metformin  And Related Other (See Comments)    Due to CHF and CRI     Prior to Admission medications   Medication Sig Start Date End Date Taking? Authorizing Provider  ACCU-CHEK FASTCLIX LANCETS MISC  05/05/17   [provider]  allopurinol (ZYLOPRIM) 100 MG tablet Take 50 mg by mouth daily.    [provider]  aspirin  EC 81 MG EC tablet Take 1 tablet (81 mg total) by mouth daily. 05/02/19   Vann, Jessica U, DO  atorvastatin  (LIPITOR ) 80 MG tablet Take 1 tablet  (80 mg total) by mouth daily. 07/20/21   Tower, Laine LABOR, MD  carvedilol  (COREG ) 12.5 MG tablet Take 1 tablet (12.5 mg total) by mouth 2 (two) times daily. 03/22/22   Sebastian Toribio GAILS, MD  doxycycline  (VIBRA -TABS) 100 MG tablet Take 1 tablet (100 mg total) by mouth 2 (two) times daily. 05/03/23   Gershon Donnice SAUNDERS, DPM  glucose blood (ONETOUCH VERIO) test strip Use to check blood sugar 2 times a day 12/24/20   Tower, Laine LABOR, MD  HUMALOG  KWIKPEN 100 UNIT/ML KwikPen Inject 4 Units into the skin 3 (three) times daily as needed (high blood sugar). 03/22/22   Sebastian Toribio GAILS, MD  hydrALAZINE  (APRESOLINE ) 100 MG tablet Take 1 tablet (100 mg total) by mouth 3 (three) times daily. 03/22/22   Sebastian Toribio GAILS, MD  HYDROcodone -acetaminophen  (NORCO/VICODIN) 5-325 MG tablet Take 1-2 tablets by mouth every 6 (six) hours as needed. 07/14/22   Gershon Donnice SAUNDERS, DPM  isosorbide  mononitrate (IMDUR ) 60 MG 24 hr tablet Take 60 mg by mouth 2 (two) times daily.    [provider]  JARDIANCE  25 MG TABS tablet Take 25 mg by mouth daily. 12/03/21   [provider]  KERENDIA 20 MG TABS Take 1 tablet by mouth daily. 02/18/22   [provider]  magnesium  oxide (MAG-OX) 400 (240 Mg) MG tablet TAKE 1 TABLET BY MOUTH EVERY DAY 02/04/21   Tower, Laine LABOR, MD  omeprazole  (PRILOSEC) 20 MG capsule Take 1 capsule (20 mg total) by mouth daily. 07/20/21   Tower, Laine LABOR, MD  OZEMPIC , 2 MG/DOSE, 8 MG/3ML SOPN Inject 2 mg into the skin once a week.    [provider]  silver  sulfADIAZINE  (SILVADENE ) 1 % cream Apply 1 Application topically daily. 05/03/23   Gershon Donnice SAUNDERS, DPM  tadalafil  (CIALIS ) 20 MG tablet TAKE ONE TABLET BY MOUTH DAILY AS NEEDED FOR ERECTILE DYSFUNCTION 11/28/20   Tower, Laine LABOR, MD  tiZANidine (ZANAFLEX) 4 MG tablet Take 4 mg by mouth 3 (three) times daily. 03/12/22   [provider]  torsemide  (DEMADEX ) 20 MG tablet Take 1 tablet (20 mg total) by mouth daily. 03/26/22    Sebastian Toribio  V, MD  TOUJEO  MAX SOLOSTAR 300 UNIT/ML Solostar Pen Inject 15 Units into the skin every evening. 03/22/22   Sebastian Toribio GAILS, MD  potassium chloride  (KLOR-CON ) 20 MEQ packet Take 20 mEq by mouth 2 (two) times daily.  04/23/11  [provider]    ___________________________________________________________________________________________________ Physical Exam:    09/29/2023    5:43 PM 09/29/2023   11:31 AM 07/15/2022    8:20 AM  Vitals with BMI  Height   5' 11  Weight   223 lbs 6 oz  BMI   31.17  Systolic 155 139 891  Diastolic 87 77 66  Pulse 66 77 73     1. General:  in No  Acute distress   Chronically ill   -appearing 2. Psychological: Alert and   Oriented 3. Head/ENT:    Dry Mucous Membranes                          Head Non traumatic, neck supple                           Poor Dentition 4. SKIN:  decreased Skin turgor,  Skin clean Dry     5. Heart: Regular rate and rhythm no  Murmur, no Rub or gallop 6. Lungs , no wheezes or crackles   7. Abdomen: Soft,  non-tender, Non distended  bowel sounds present 8. Lower extremities: no clubbing, cyanosis, no  edema 9. Neurologically Grossly intact, moving all 4 extremities equally   10. MSK: Normal range of motion    Chart has been reviewed  ______________________________________________________________________________________________  Assessment/Plan  63 y.o. male with medical history significant of Dm2, CKD, HTN, HLD, CAD    Admitted for right foot wound infection   Present on Admission:  Acute renal failure superimposed on stage 4 chronic kidney disease (HCC)  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 diabetes mellitus (HCC)  Coronary Artery Disease (non-obstructive by cath 05/2011)  DM (diabetes mellitus), type 2 with renal complications (HCC)  Hyperlipidemia associated with type 2 diabetes mellitus (HCC)  Hypertension  Normocytic anemia  Right foot ulcer (HCC)  Hypokalemia  Wound infection     Acute  renal failure superimposed on stage 4 chronic kidney disease (HCC) Gently rehydrate continue to follow order urine electrolytes Hold diuretics for tonight  CHF (congestive heart failure) (HCC) Avoid over aggressive rehydration monitor closely fluid status  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 diabetes mellitus (HCC)  -chronic avoid nephrotoxic medications such as NSAIDs, Vanco Zosyn  combo,  avoid hypotension, continue to follow renal function   Coronary Artery Disease (non-obstructive by cath 05/2011) Chronic stable continue Lipitor  80 mg daily continue Coreg  12.5 mg p.o. daily  DM (diabetes mellitus), type 2 with renal complications (HCC) Order sliding scale  Hyperlipidemia associated with type 2 diabetes mellitus (HCC) Continue Lipitor  80 mg daily  Hypertension Continue carvedilol  12.5 mg p.o. twice daily continue hydralazine  100 mg p.o. 3 times daily and isosorbide  60 mg p.o. daily  Normocytic anemia Order anemia panel  Right foot ulcer (HCC) Plain imaging showing no evidence of osteomyelitis MRI ordered.  Started on IV antibiotics including Unasyn  and linezolid  Pending results of MRI   Podiatry Dr. Pierce is  Aware will see pt in AM  Hypokalemia - will replace electrolytes and repeat  check Mg, phos and Ca level and replace as needed Monitor on telemetry   Lab Results  Component Value Date  K 2.9 (L) 09/29/2023     Lab Results  Component Value Date   CREATININE 3.64 (H) 09/29/2023   Lab Results  Component Value Date   MG 2.6 (H) 03/18/2022   Lab Results  Component Value Date   CALCIUM  8.8 (L) 09/29/2023   PHOS 3.4 03/22/2022     Other plan as per orders.  DVT prophylaxis:  SCD   Code Status:    Code Status: Prior FULL CODE  as per patient    I had personally discussed CODE STATUS with patient and family   ACP   none     Family Communication:   Family  at  Bedside  plan of care was discussed    with  Wife,    Diet     Disposition Plan:        To home  once workup is complete and patient is stable   Following barriers for discharge:                                                       Electrolytes corrected                                                           Pain controlled with PO medications                                                          Will need consultants to evaluate patient prior to discharge                                      Consults called: podiatry   Admission status:  ED Disposition     ED Disposition  Admit   Condition  --   Comment  Hospital Area: [facility] [[id number]]  Level of Care: Telemetry Medical [[id number]]  May admit patient to [facility] or [facility] if equivalent level of care is available:: No  Covid Evaluation: Asymptomatic - no recent exposure (last 10 days) testing not required  Diagnosis: Wound infection [[id number]]  Admitting Physician: Zacchaeus Halm [[id number]]  Attending Physician: Michel Hendon [[id number]]  Certification:: I certify this patient will need inpatient services for at least 2 midnights           inpatient     I Expect 2 midnight stay secondary to severity of patient's current illness need for inpatient interventions justified by the following:     Severe lab/radiological/exam abnormalities including:    There are no diagnoses linked to this encounter. and extensive comorbidities including:   DM2    CHF   CAD  CKD  That are currently affecting medical management.   I expect  patient to be hospitalized for 2 midnights requiring inpatient medical care.  Patient is at high risk for adverse outcome (such as loss of life or disability) if not treated.  Indication for inpatient stay as follows:   severe pain requiring acute inpatient management,  inability to maintain oral hydration   Need for operative/procedural  intervention    Need for IV antibiotics, IV fluids     Level of care     tele  For 12H    Lelynd Poer 09/29/2023,  10:31 PM    [facility]     after 2 AM please page floor coverage   If 7AM-7PM, please contact the day team taking care of the patient using [email or web address]

## 2023-09-29 NOTE — Assessment & Plan Note (Signed)
Order anemia panel

## 2023-09-29 NOTE — ED Provider Notes (Signed)
  EMERGENCY DEPARTMENT AT [facility] Provider Note   CSN: [id number] Arrival date & time: 09/29/23  1127     Patient presents with: Wound Infection   Roberto Allen is a 63 y.o. male.   HPI Patient presents after early going to the [place].  He has a history of multiple medical issues including CKD, diabetes, hypertension and has a long standing nonhealing wound on the right medial distal foot.  There with diastased wound has become more malodorous.  He went to urgent care at the [facility], had x-ray, blood work, and was sent here for additional evaluation, consideration of antibiotics. He states that he feels generally fine, denies fever, denies other complaints.     Prior to Admission medications   Medication Sig Start Date End Date Taking? Authorizing Provider  ACCU-CHEK FASTCLIX LANCETS MISC  05/05/17   [provider]  allopurinol (ZYLOPRIM) 100 MG tablet Take 50 mg by mouth daily.    [provider]  aspirin  EC 81 MG EC tablet Take 1 tablet (81 mg total) by mouth daily. 05/02/19   Vann, Jessica U, DO  atorvastatin  (LIPITOR ) 80 MG tablet Take 1 tablet (80 mg total) by mouth daily. 07/20/21   Tower, Laine LABOR, MD  carvedilol  (COREG ) 12.5 MG tablet Take 1 tablet (12.5 mg total) by mouth 2 (two) times daily. 03/22/22   Sebastian Toribio GAILS, MD  doxycycline  (VIBRA -TABS) 100 MG tablet Take 1 tablet (100 mg total) by mouth 2 (two) times daily. 05/03/23   Gershon Donnice SAUNDERS, DPM  glucose blood (ONETOUCH VERIO) test strip Use to check blood sugar 2 times a day 12/24/20   Tower, Laine LABOR, MD  HUMALOG  KWIKPEN 100 UNIT/ML KwikPen Inject 4 Units into the skin 3 (three) times daily as needed (high blood sugar). 03/22/22   Sebastian Toribio GAILS, MD  hydrALAZINE  (APRESOLINE ) 100 MG tablet Take 1 tablet (100 mg total) by mouth 3 (three) times daily. 03/22/22   Sebastian Toribio GAILS, MD  HYDROcodone -acetaminophen  (NORCO/VICODIN) 5-325 MG tablet Take 1-2 tablets by mouth every 6 (six) hours as  needed. 07/14/22   Gershon Donnice SAUNDERS, DPM  isosorbide  mononitrate (IMDUR ) 60 MG 24 hr tablet Take 60 mg by mouth 2 (two) times daily.    [provider]  JARDIANCE  25 MG TABS tablet Take 25 mg by mouth daily. 12/03/21   [provider]  KERENDIA 20 MG TABS Take 1 tablet by mouth daily. 02/18/22   [provider]  magnesium  oxide (MAG-OX) 400 (240 Mg) MG tablet TAKE 1 TABLET BY MOUTH EVERY DAY 02/04/21   Tower, Laine LABOR, MD  omeprazole  (PRILOSEC) 20 MG capsule Take 1 capsule (20 mg total) by mouth daily. 07/20/21   Tower, Laine LABOR, MD  OZEMPIC , 2 MG/DOSE, 8 MG/3ML SOPN Inject 2 mg into the skin once a week.    [provider]  silver  sulfADIAZINE  (SILVADENE ) 1 % cream Apply 1 Application topically daily. 05/03/23   Gershon Donnice SAUNDERS, DPM  tadalafil  (CIALIS ) 20 MG tablet TAKE ONE TABLET BY MOUTH DAILY AS NEEDED FOR ERECTILE DYSFUNCTION 11/28/20   Tower, Laine LABOR, MD  tiZANidine (ZANAFLEX) 4 MG tablet Take 4 mg by mouth 3 (three) times daily. 03/12/22   [provider]  torsemide  (DEMADEX ) 20 MG tablet Take 1 tablet (20 mg total) by mouth daily. 03/26/22   Sebastian Toribio GAILS, MD  TOUJEO  MAX SOLOSTAR 300 UNIT/ML Solostar Pen Inject 15 Units into the skin every evening. 03/22/22   Sebastian Toribio GAILS, MD  potassium chloride  (KLOR-CON ) 20 MEQ packet Take 20 mEq by mouth 2 (two) times daily.  04/23/11  [provider]    Allergies: Metformin  and related    Review of Systems  Updated Vital Signs BP (!) 155/87   Pulse 66   Temp 98.3 F (36.8 C) (Oral)   Resp 14   SpO2 97%   Physical Exam Vitals and nursing note reviewed.  Constitutional:      General: He is not in acute distress.    Appearance: He is well-developed.  HENT:     Head: Normocephalic and atraumatic.  Eyes:     Conjunctiva/sclera: Conjunctivae normal.  Cardiovascular:     Rate and Rhythm: Normal rate and regular rhythm.     Pulses: Normal pulses.  Pulmonary:     Effort: Pulmonary  effort is normal. No respiratory distress.     Breath sounds: No stridor.  Abdominal:     General: There is no distension.  Skin:    General: Skin is warm and dry.      Neurological:     Mental Status: He is alert and oriented to person, place, and time.     (all labs ordered are listed, but only abnormal results are displayed) Labs Reviewed  CBC - Abnormal; Notable for the following components:      Result Value   RBC 3.49 (*)    Hemoglobin 9.4 (*)    HCT 29.4 (*)    All other components within normal limits  COMPREHENSIVE METABOLIC PANEL WITH GFR - Abnormal; Notable for the following components:   Potassium 2.9 (*)    Glucose, Bld 274 (*)    BUN 44 (*)    Creatinine, Ser 3.64 (*)    Calcium  8.8 (*)    Albumin 3.3 (*)    GFR, Estimated 18 (*)    All other components within normal limits  CULTURE, BLOOD (ROUTINE X 2)  CULTURE, BLOOD (ROUTINE X 2)  I-STAT CG4 LACTIC ACID, ED    EKG: None  Radiology: DG Foot Complete Right Result Date: 09/29/2023 CLINICAL DATA:  Foot wound. EXAM: RIGHT FOOT COMPLETE - 3+ VIEW COMPARISON:  03/01/2023. FINDINGS: No fracture.  No bone lesion. There are no areas of bone resorption to suggest osteomyelitis. Previous resection of the distal second toe at the D IP joint. Joints are normally spaced and aligned. No significant arthropathic changes. Small dorsal and plantar calcaneal spurs. Small amount of soft tissue air is noted medial to first metatarsophalangeal joint. There is associated soft tissue swelling with more diffuse soft tissue swelling along the forefoot. IMPRESSION: 1. No fracture, bone lesion or evidence of osteomyelitis. Electronically Signed   By: Alm Parkins M.D.   On: 09/29/2023 13:13     Procedures   Medications Ordered in the ED - No data to display                                  Medical Decision Making Patient with multiple risk factors for nonhealing diabetic foot ulcer, concern for bacteremia, sepsis presents with  an open wound after initial evaluation in urgent care. Concern for nonhealing wound versus cellulitis versus osteomyelitis.  Paperwork from [place] reviewed, x-ray interpretation reviewed as well, no films available. Pulse ox 97% room air normal cardiac 65 sinus normal  Amount and/or Complexity of Data Reviewed Independent Historian: spouse External Data Reviewed: notes.    Details: [facility] Labs: ordered. Decision-making  details documented in ED Course. Radiology: ordered and independent interpretation performed. Decision-making details documented in ED Course.  Risk Prescription drug management. Decision regarding hospitalization. Diagnosis or treatment significantly limited by social determinants of health.   9:24 PM Patient's labs generally reassuring, MRI pending, labs were notable for mild hypokalemia, otherwise consistent with prior including evidence of CKD.  I discussed his presentation with our pharmacy team, for recommendations for nonhealing diabetic ulcer with evidence for infection, while MRI is pending to rule out osteomyelitis patient will start Unasyn , linezolid , be admitted for further monitoring, management.     Final diagnoses:  Wound infection     Garrick Charleston, MD 09/29/23 2125

## 2023-09-29 NOTE — ED Triage Notes (Signed)
 Pt sent from [facility] for wound check on R foot and IV abx. Wound has been present for 1 week.

## 2023-09-29 NOTE — Assessment & Plan Note (Addendum)
 Plain imaging showing no evidence of osteomyelitis MRI ordered.  Started on IV antibiotics including Unasyn  and linezolid  Pending results of MRI   Podiatry Dr. Pierce is  Aware will see pt in AM

## 2023-09-29 NOTE — Assessment & Plan Note (Signed)
Order sliding scale  

## 2023-09-29 NOTE — ED Provider Triage Note (Signed)
 Emergency Medicine Provider Triage Evaluation Note  Roberto Allen , a 63 y.o. male  was evaluated in triage.  Pt complains of diabetic foot wound on right foot.  Just seen at [facility], Free air shown on soft tissue x-ray, sent for IV antibiotics due to concern for developing osteomyelitis versus more severe diabetic foot infection. No fever, chills. History of osteomyelitis.  Review of Systems  Positive: Foot wound Negative: Fever, chills  Physical Exam  BP 139/77   Pulse 77   Temp 98.1 F (36.7 C) (Oral)   Resp 18   SpO2 95%  Gen:   Awake, no distress   Resp:  Normal effort  MSK:   Moves extremities without difficulty  Other:  Diabetic foot wound noted on the right, some ulcer and necrosis noted. Minimal surrounding skin redness. Intact dp,pt pulses 1-2+ on the affected extremity  Medical Decision Making  Medically screening exam initiated at 12:29 PM.  Appropriate orders placed.  Roberto Allen was informed that the remainder of the evaluation will be completed by another provider, this initial triage assessment does not replace that evaluation, and the importance of remaining in the ED until their evaluation is complete.  Workup initiated in triage    Roberto Allen DEL, PA-C 09/29/23 1229

## 2023-09-29 NOTE — Assessment & Plan Note (Signed)
 Continue carvedilol  12.5 mg p.o. twice daily continue hydralazine  100 mg p.o. 3 times daily and isosorbide  60 mg p.o. daily

## 2023-09-29 NOTE — Assessment & Plan Note (Signed)
-  chronic avoid nephrotoxic medications such as NSAIDs, Vanco Zosyn combo,  avoid hypotension, continue to follow renal function

## 2023-09-29 NOTE — Assessment & Plan Note (Signed)
-   will replace electrolytes and repeat  check Mg, phos and Ca level and replace as needed Monitor on telemetry   Lab Results  Component Value Date   K 2.9 (L) 09/29/2023     Lab Results  Component Value Date   CREATININE 3.64 (H) 09/29/2023   Lab Results  Component Value Date   MG 2.6 (H) 03/18/2022   Lab Results  Component Value Date   CALCIUM  8.8 (L) 09/29/2023   PHOS 3.4 03/22/2022

## 2023-09-30 ENCOUNTER — Encounter (HOSPITAL_COMMUNITY)

## 2023-09-30 ENCOUNTER — Other Ambulatory Visit (HOSPITAL_COMMUNITY): Payer: Self-pay

## 2023-09-30 DIAGNOSIS — L089 Local infection of the skin and subcutaneous tissue, unspecified: Secondary | ICD-10-CM | POA: Diagnosis not present

## 2023-09-30 DIAGNOSIS — L97512 Non-pressure chronic ulcer of other part of right foot with fat layer exposed: Secondary | ICD-10-CM

## 2023-09-30 DIAGNOSIS — T148XXA Other injury of unspecified body region, initial encounter: Secondary | ICD-10-CM | POA: Diagnosis not present

## 2023-09-30 DIAGNOSIS — L02611 Cutaneous abscess of right foot: Secondary | ICD-10-CM

## 2023-09-30 LAB — CBC
HCT: 30.7 % — ABNORMAL LOW (ref 39.0–52.0)
Hemoglobin: 9.7 g/dL — ABNORMAL LOW (ref 13.0–17.0)
MCH: 26.2 pg (ref 26.0–34.0)
MCHC: 31.6 g/dL (ref 30.0–36.0)
MCV: 83 fL (ref 80.0–100.0)
Platelets: 326 K/uL (ref 150–400)
RBC: 3.7 MIL/uL — ABNORMAL LOW (ref 4.22–5.81)
RDW: 15.5 % (ref 11.5–15.5)
WBC: 9.9 K/uL (ref 4.0–10.5)
nRBC: 0 % (ref 0.0–0.2)

## 2023-09-30 LAB — MAGNESIUM: Magnesium: 2.3 mg/dL (ref 1.7–2.4)

## 2023-09-30 LAB — GLUCOSE, CAPILLARY
Glucose-Capillary: 99 mg/dL (ref 70–99)
Glucose-Capillary: 198 mg/dL — ABNORMAL HIGH (ref 70–99)
Glucose-Capillary: 153 mg/dL — ABNORMAL HIGH (ref 70–99)

## 2023-09-30 LAB — FOLATE: Folate: 10.2 ng/mL (ref >5.9)

## 2023-09-30 LAB — COMPREHENSIVE METABOLIC PANEL WITH GFR
ALT: 16 U/L (ref 0–44)
AST: 17 U/L (ref 15–41)
Albumin: 3.1 g/dL — ABNORMAL LOW (ref 3.5–5.0)
Alkaline Phosphatase: 105 U/L (ref 38–126)
Anion gap: 7 (ref 5–15)
BUN: 41 mg/dL — ABNORMAL HIGH (ref 8–23)
CO2: 27 mmol/L (ref 22–32)
Calcium: 8.5 mg/dL — ABNORMAL LOW (ref 8.9–10.3)
Chloride: 106 mmol/L (ref 98–111)
Creatinine, Ser: 3.28 mg/dL — ABNORMAL HIGH (ref 0.61–1.24)
GFR, Estimated: 20 mL/min — ABNORMAL LOW (ref >60)
Glucose, Bld: 114 mg/dL — ABNORMAL HIGH (ref 70–99)
Potassium: 3.1 mmol/L — ABNORMAL LOW (ref 3.5–5.1)
Sodium: 140 mmol/L (ref 135–145)
Total Bilirubin: 0.7 mg/dL (ref 0.0–1.2)
Total Protein: 6.5 g/dL (ref 6.5–8.1)

## 2023-09-30 LAB — C-REACTIVE PROTEIN: CRP: 0.6 mg/dL (ref <1.0)

## 2023-09-30 LAB — VITAMIN B12: Vitamin B-12: 308 pg/mL (ref 180–914)

## 2023-09-30 LAB — HEMOGLOBIN A1C
Hgb A1c MFr Bld: 7.8 % — ABNORMAL HIGH (ref 4.8–5.6)
Mean Plasma Glucose: 177.16 mg/dL

## 2023-09-30 LAB — HIV ANTIBODY (ROUTINE TESTING W REFLEX): HIV Screen 4th Generation wRfx: NONREACTIVE

## 2023-09-30 LAB — PREALBUMIN: Prealbumin: 25 mg/dL (ref 18–38)

## 2023-09-30 LAB — PHOSPHORUS: Phosphorus: 3.6 mg/dL (ref 2.5–4.6)

## 2023-09-30 MED ORDER — FENTANYL CITRATE PF 50 MCG/ML IJ SOSY: 12.5000 ug | PREFILLED_SYRINGE | INTRAMUSCULAR | Status: DC | PRN

## 2023-09-30 MED ORDER — CARVEDILOL 12.5 MG PO TABS
12.5000 mg | ORAL_TABLET | Freq: Two times a day (BID) | ORAL | Status: DC
  Administered 2023-09-30 (×2): 12.5 mg via ORAL
  Filled 2023-09-30 (×2): qty 1

## 2023-09-30 MED ORDER — ATORVASTATIN CALCIUM 80 MG PO TABS
80.0000 mg | ORAL_TABLET | Freq: Every day | ORAL | Status: DC
  Administered 2023-09-30: 80 mg via ORAL
  Filled 2023-09-30: qty 1

## 2023-09-30 MED ORDER — AMOXICILLIN-POT CLAVULANATE 500-125 MG PO TABS
500.0000 mg | ORAL_TABLET | Freq: Two times a day (BID) | ORAL | 0 refills | Status: AC
  Filled 2023-09-30: qty 20, 10d supply, fill #0

## 2023-09-30 MED ORDER — ACETAMINOPHEN 650 MG RE SUPP: 650.0000 mg | Freq: Four times a day (QID) | RECTAL | Status: DC | PRN

## 2023-09-30 MED ORDER — SODIUM CHLORIDE 0.9 % IV SOLN: INTRAVENOUS | Status: AC

## 2023-09-30 MED ORDER — DOXYCYCLINE HYCLATE 100 MG PO TABS
100.0000 mg | ORAL_TABLET | Freq: Two times a day (BID) | ORAL | 0 refills | Status: AC
  Filled 2023-09-30: qty 20, 10d supply, fill #0

## 2023-09-30 MED ORDER — ONDANSETRON HCL 4 MG/2ML IJ SOLN
4.0000 mg | Freq: Four times a day (QID) | INTRAMUSCULAR | Status: DC | PRN
Start: 2023-09-30 — End: 2023-09-30

## 2023-09-30 MED ORDER — SEVELAMER CARBONATE 800 MG PO TABS
800.0000 mg | ORAL_TABLET | Freq: Three times a day (TID) | ORAL | Status: DC
  Administered 2023-09-30 (×2): 800 mg via ORAL
  Filled 2023-09-30 (×2): qty 1

## 2023-09-30 MED ORDER — ISOSORBIDE MONONITRATE ER 60 MG PO TB24
60.0000 mg | ORAL_TABLET | Freq: Two times a day (BID) | ORAL | Status: DC
  Administered 2023-09-30 (×2): 60 mg via ORAL
  Filled 2023-09-30 (×2): qty 1

## 2023-09-30 MED ORDER — HYDROCODONE-ACETAMINOPHEN 5-325 MG PO TABS: 1.0000 | ORAL_TABLET | ORAL | Status: DC | PRN

## 2023-09-30 MED ORDER — ACETAMINOPHEN 325 MG PO TABS: 650.0000 mg | ORAL_TABLET | Freq: Four times a day (QID) | ORAL | Status: DC | PRN

## 2023-09-30 MED ORDER — ONDANSETRON HCL 4 MG PO TABS: 4.0000 mg | ORAL_TABLET | Freq: Four times a day (QID) | ORAL | Status: DC | PRN

## 2023-09-30 MED ORDER — POTASSIUM CHLORIDE CRYS ER 20 MEQ PO TBCR
40.0000 meq | EXTENDED_RELEASE_TABLET | Freq: Once | ORAL | Status: AC
  Administered 2023-09-30: 40 meq via ORAL
  Filled 2023-09-30: qty 2

## 2023-09-30 MED ORDER — INSULIN GLARGINE-YFGN 100 UNIT/ML ~~LOC~~ SOLN: 10.0000 [IU] | Freq: Every day | SUBCUTANEOUS | Status: DC

## 2023-09-30 MED ORDER — ALBUTEROL SULFATE (2.5 MG/3ML) 0.083% IN NEBU: 2.5000 mg | INHALATION_SOLUTION | RESPIRATORY_TRACT | Status: DC | PRN

## 2023-09-30 MED ORDER — ASPIRIN 81 MG PO TBEC
81.0000 mg | DELAYED_RELEASE_TABLET | Freq: Every day | ORAL | Status: DC
  Administered 2023-09-30: 81 mg via ORAL
  Filled 2023-09-30: qty 1

## 2023-09-30 MED ORDER — ORAL CARE MOUTH RINSE: 15.0000 mL | OROMUCOSAL | Status: DC | PRN

## 2023-09-30 MED ORDER — HYDRALAZINE HCL 50 MG PO TABS
100.0000 mg | ORAL_TABLET | Freq: Three times a day (TID) | ORAL | Status: DC
  Administered 2023-09-30: 100 mg via ORAL
  Filled 2023-09-30: qty 2

## 2023-09-30 MED ORDER — PANTOPRAZOLE SODIUM 40 MG PO TBEC
40.0000 mg | DELAYED_RELEASE_TABLET | Freq: Every day | ORAL | Status: DC
  Administered 2023-09-30: 40 mg via ORAL
  Filled 2023-09-30: qty 1

## 2023-09-30 MED ORDER — POTASSIUM CHLORIDE CRYS ER 20 MEQ PO TBCR: 40.0000 meq | EXTENDED_RELEASE_TABLET | Freq: Once | ORAL | Status: DC

## 2023-09-30 MED ORDER — INSULIN ASPART 100 UNIT/ML IJ SOLN
0.0000 [IU] | Freq: Three times a day (TID) | INTRAMUSCULAR | Status: DC
  Administered 2023-09-30 (×2): 2 [IU] via SUBCUTANEOUS

## 2023-09-30 NOTE — Progress Notes (Signed)
 Reviewed AVS, patient expressed understanding of medications, MD follow up reviewed.   Removed IV, Site clean, dry and intact.  See LDA for information on wounds at discharge. Patient states all belongings brought to the hospital at time of admission are accounted for and packed to take home.  Picked up medications from [facility] pharmacy.   [facility] contacted to transport patient to entrance A where family member was waiting in vehicle to transport home.

## 2023-09-30 NOTE — Evaluation (Signed)
 Physical Therapy Brief Evaluation and Discharge Note Patient Details Name: Roberto Allen MRN: [id number] DOB: April 22, 1960 Today's Date: 09/30/2023   History of Present Illness  Roberto Allen is a 63 y.o. male admitted 09/29/23 for right foot wound infection. No evidence of osteomyelitis on imaging. Pt s/p bilateral subcutaneous tissue debridement 8/15. PMHx: T2DM, CKD, HTN, HLD, and CAD.   Clinical Impression  Pt greeted supine in bed, pleasant and agreeable to PT evaluation. PTA, pt was independent with functional mobility, independent with the majority of ADLs, intermittent assist to manage bilateral feet, independent with IADLs, and working full-time as a Art therapist at [employer]. He lives with his wife in a one story house with 2-5 STE. Pt completed bed mobility with modI and OOB mobility wearing bilateral post-op shoes with supervision. He ambulated ~253ft and ascended/descended 4 stairs. Patient feels ready and safe for discharge home. I have answered all their questions related to mobility. He is very close to his baseline function, no further PT needs.       PT Assessment Patient does not need any further PT services  Assistance Needed at Discharge  PRN    Equipment Recommendations None recommended by PT  Recommendations for Other Services       Precautions/Restrictions Precautions Precautions: Fall Required Braces or Orthoses: Other Brace Other Brace: Post-Op Shoe BLE Restrictions Weight Bearing Restrictions Per Provider Order: Yes RLE Weight Bearing Per Provider Order: Weight bearing as tolerated LLE Weight Bearing Per Provider Order: Weight bearing as tolerated        Mobility  Bed Mobility   Supine/Sidelying to sit: Modified independent (Device/Increased time) Sit to supine/sidelying: Modified independent (Device/Increased time) General bed mobility comments: HOB elevated. No use of bed rails or cues for sequencing.  Transfers Overall transfer level: Needs  assistance Equipment used: None Transfers: Sit to/from Stand Sit to Stand: Supervision           General transfer comment: Pt stood from lowest bed height. Pushed up with BUE support. Good eccentric control.    Ambulation/Gait Ambulation/Gait assistance: Supervision Gait Distance (Feet): 200 Feet Assistive device: None Gait Pattern/deviations: Step-through pattern, Decreased stride length, Decreased dorsiflexion - right, Decreased dorsiflexion - left   General Gait Details: Pt ambulated with a reciprocal gait pattern, even weight shift, and reduced foot clearence. Cues for increased hip/knee flex bilat. Pt navigated room/hallway well, no LOB.  Home Activity Instructions    Stairs Stairs: Yes Stairs assistance: Supervision Stair Management: Forwards, Alternating pattern, Step to pattern, No rails, One rail Right Number of Stairs: 2 (x3) General stair comments: Pt led with LLE. He ascended with a reciprocal gait pattern and descended with a step to pattern. Pt initially used no railings and then utilized one on the second attempt. Discussed how his wife should be positioned to support him if needed.  Modified Rankin (Stroke Patients Only)        Balance Overall balance assessment: Mild deficits observed, not formally tested                        Pertinent Vitals/Pain PT - Brief Vital Signs All Vital Signs Stable: Yes Pain Assessment Pain Assessment: No/denies pain     Home Living Family/patient expects to be discharged to:: Private residence Living Arrangements: Spouse/significant other Available Help at Discharge: Family;Available PRN/intermittently Home Environment: Stairs to enter  Stairs-Number of Steps: 5 at front door with bilat rails; 2 at garage door without rails Home Equipment: Shower seat;Cane - single point;Toilet  riser        Prior Function Level of Independence: Independent Comments: Independent without an AD. Denies falls in the past  52mo. 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 Wife present and supportive throughout session.    Exercises     Assessment/Plan    PT Problem List         PT Visit Diagnosis Difficulty in walking, not elsewhere classified (R26.2)    No Skilled PT Patient is supervision for all activity/mobility;All education completed;Patient will have necessary level of assist by caregiver at discharge   Co-evaluation                AMPAC 6 Clicks Help needed turning from your back to your side while in a flat bed without using bedrails?: None Help needed moving from lying on your back to sitting on the side of a flat bed without using bedrails?: None Help needed moving to and from a bed to a chair (including a wheelchair)?: A Little Help needed standing up from a chair using your arms (e.g., wheelchair or bedside chair)?: A Little Help needed to walk in hospital room?: A Little Help needed climbing 3-5 steps with a railing? : A Little 6 Click Score: 20      End of Session   Activity Tolerance: Patient tolerated treatment well Patient left: in bed;with call bell/phone within reach;with family/visitor present Nurse Communication: Mobility status PT Visit Diagnosis: Difficulty in walking, not elsewhere classified (R26.2)     Time: 8756-8693 PT Time Calculation (min) (ACUTE ONLY): 23 min  Charges:   PT Evaluation $PT Eval Low Complexity: 1  Low      Randall SAUNDERS, PT, DPT Acute Rehabilitation Services Office: [phone] Secure Chat Preferred  Delon CHRISTELLA Callander  09/30/2023, 2:39 PM

## 2023-09-30 NOTE — Procedures (Signed)
  Patient presents with hyperkeratotic lesion of the right foot underlying the first metatarsal head.  Concern for underlying fluctuance on exam.  MRI with concern for phlegmonous changes.  Recommended bedside debridement of the abscess and underlying ulceration.  Patient was agreeable.  Verbal consent was obtained.  Attention was first directed to the left foot:  After prep with Betadine swab over the affected area, a 15 blade was used to excisionally debride the hyperkeratotic tissue superficially to the level of subcutaneous fat tissue.  Excisional debridement was carried out with 15 blade so as to excise any necrotic or fibrotic tissue present which however was necrotic tissue present in the wound bed.  Good bleeding was noted from the wound bed during the debridement  Procedure: Excisional Debridement of Wound left foot plantar first met head Rationale: Removal of non-viable soft tissue from the wound to promote healing.  Anesthesia: None required secondary to neuropathy Pre-Debridement Wound Measurements: 0.4 cm x 0.3 cm x 0.2 cm  Post-Debridement Wound Measurements: 0.4 cm x 0.3 cm x 0. cm  Type of Debridement: Excisional Tissue Removed: Non-viable soft tissue and hyperkeratotic tissue necrotic tissue Depth of Debridement: Subcutaneous fat tissue Instrumentation: 15 blade scalpel  Technique: Sharp excisional debridement to bleeding, viable wound base.  Dressing: Dry, sterile, compression dressing. Disposition: Patient tolerated procedure well. Patient to return in 1-2 week for follow-up.   Attention was then directed to the right foot:  After prep with Betadine swab over the affected area, a 15 blade was used to excisionally debride the hyperkeratotic tissue superficially to the level of subcutaneous fat tissue.  There was noted to be small amount of purulent drainage underlying the hyperkeratotic tissue.  This was cultured with aerobic anaerobic culture swab.  Excisional debridement was  carried out with 15 blade so as to excise any necrotic or fibrotic or hyperkeratotic tissue present which however was necrotic tissue present in the wound bed.  There was good bleeding noted from the wound during the debridement.  The wound was debrided to healthy granular tissue with no evidence of residual infected tissue present.  No sinus tract or evidence of residual abscess present.  Negative probe to bone.  Procedure: Excisional Debridement of Wound left foot plantar first met head Rationale: Removal of non-viable soft tissue from the wound to promote healing.  Anesthesia: None required secondary to neuropathy Pre-Debridement Wound Measurements: 2 cm x 2  cm x 0.2 cm  Post-Debridement Wound Measurements: 2 cm x 2 cm x 0.2 cm  Type of Debridement: Excisional Tissue Removed: Non-viable soft tissue and hyperkeratotic tissue necrotic tissue Depth of Debridement: Subcutaneous fat tissue Instrumentation: 15 blade scalpel  Technique: Sharp excisional debridement to bleeding, viable wound base.  Dressing: Dry, sterile, compression dressing. Disposition: Patient tolerated procedure well. Patient to return in 1 Week  Betadine Xeroform 4 x 4 gauze Kerlix and Ace wrap was applied to the right foot and compressive dressing for hemostasis and protection.  On the left foot a Betadine 4 x 4 gauze and tape dressing was applied.  Patient is weightbearing as tolerated in bilateral postop shoe

## 2023-09-30 NOTE — Consult Note (Signed)
 PODIATRY CONSULTATION  NAME Roberto Allen MRN [id number] DOB Aug 01, 1960 DOA 09/29/2023   Reason for consult:  Chief Complaint  Patient presents with   Wound Infection    Attending/Consulting physician: Z. Sira MD  History of present illness: Roberto Allen is a 63 y.o. male with medical history significant of Dm2, CKD, HTN, HLD, CAD    Presented with right foot wound Patient presents to emergency department for wound check on right foot the wound has been present for the past week Patient is diabetic usually gets his care from [place] He has longstanding nonhealing wound of his right medial distal foot has been having foul order Had some x-rays done at urgent care and blood work and was told to go to emergency department where he was promised he would get admitted for IV antibiotics Denies any fevers or chills Patient uses insulin  for his diabetes  Discussed with patient he noticed drainage with foul order on right foot and went to [place] who sent him to ED. He does report neuropathy. In process of switching from [place] back to our office for DM foot care. Has seen Dr. Gershon in the past.   Past Medical History:  Diagnosis Date   CAD (coronary artery disease)    a. LHC 4/13: mD1 25%, oD2 25%, pOM1 25%, pRCA 25%, dRCA 40%, EF 30%   CHF (congestive heart failure) (HCC)    Chronic kidney disease (CKD), stage III (moderate) (HCC)    Chronic systolic heart failure (HCC)    a. EF 30-35% in 04/2011, improved to 55-60% in 01/2012.   COPD (chronic obstructive pulmonary disease) (HCC)    Diabetes mellitus    GERD (gastroesophageal reflux disease)    History of pneumonia    Hyperkalemia    a. 12/2012 - spironolactone , KCl supp discontinued.   Hyperlipidemia    Hypertension    Hypertriglyceridemia    a. 12/2012 - started on fenofibrate .   NICM (nonischemic cardiomyopathy) (HCC)    a. Echo 3/13: mild LVH, EF 30-35%, grade 3 diast dysfxn, mod LAE;  RHC 4/13:  RA 8, RV 58/10, PA 56/18, mean 35,  PCWP mean 17, LV 154/27, CO 4.3, CI 2.0. b. EF Improved >>> Echo (01/2012):  Mild LVH, EF 55-60%, no RWMA, Gr 1 DD       Latest Ref Rng & Units 09/30/2023    6:43 AM 09/29/2023   12:25 PM 03/22/2022    4:59 AM  CBC  WBC 4.0 - 10.5 K/uL 9.9  9.4  10.3   Hemoglobin 13.0 - 17.0 g/dL 9.7  9.4  88.9   Hematocrit 39.0 - 52.0 % 30.7  29.4  34.9   Platelets 150 - 400 K/uL 326  295  374        Latest Ref Rng & Units 09/30/2023    6:43 AM 09/29/2023    9:55 PM 09/29/2023   12:25 PM  BMP  Glucose 70 - 99 mg/dL 885  99  725   BUN 8 - 23 mg/dL 41  45  44   Creatinine 0.61 - 1.24 mg/dL 6.71  6.52  6.35   Sodium 135 - 145 mmol/L 140  141  138   Potassium 3.5 - 5.1 mmol/L 3.1  3.4  2.9   Chloride 98 - 111 mmol/L 106  101  100   CO2 22 - 32 mmol/L 27  28  26    Calcium  8.9 - 10.3 mg/dL 8.5  9.2  8.8       Physical  Exam: Lower Extremity Exam  DP and PT pulses 2+ bilaterally  Sensation diminished to forefoot bilaterally  Left foot plantar 1st Met head has hyperkeratotic lesion with underlying ulceration upon debridement. Post debridement measures 0.4 x 0.3 x 0.2 cm and probes to subcutaneous fat tissue.    Left well healed prior partial hallux amp  On right foot sub 1st met head there is hyperkeratotic lesion on right foot with fluctuance underlying On debridement there is small sub cutaneous abscess with small amount purulent fluid. This was cultured. Underlying is a 2x2x0.2 cm ulceration that probes to subcuatneous fat tissue layer. No deep sinus, negative probe to bone     ASSESSMENT/PLAN OF CARE 63 y.o. male with PMHx significant for  Dm2, CKD, HTN, HLD, CAD  with bilateral foot ulceration right worse than left. Right foot with subcutaneous abscess s/p bedside I&D procedure - no evidence deep infection on either side.   WBC 9.9 A1c 7.8 CRP 0.6, ESR 35  MRI R foot: Soft tissue wound below the distal first digit with moderate phlegmonous change. No drainable collection or associated  osteomyelitis.  Subcutaneous edema suggesting celluliti   - Performed bedside debridement on right foot abscess see separate procedure note, underlying ulceration noted to subcutaneous fat layer. Abscess cultured.  No evidence osteomyelitis.  - Recommend daily wound care per RN here then patient at home to include wash wounds with vashe then apply small amount betadine to wound and cover with 4x4 gauze kerlix and ace wrap on right, left foot can be gauze and tape dresisng -  ok to transition to PO abx x 7 days, recommend broad spectrum Augmentin  and doxycyline until cultures come back and can be adjusted as needed. - Anticoagulation: ok to continue per primary  - Wound care: as above, daily dressing changes - WB status: wbat in post op shoe bilaterally, ordered - Pt is stable for dc home from my standpoint, follow up in 1 week with Dr. Gershon, office to call to set up, will sign off   Thank you for the consult.  Please contact me directly with any questions or concerns.           Marolyn JULIANNA Honour, DPM [facility] / [facility]    [address] [place], [place] [address]                Office [phone]  Fax [phone]

## 2023-09-30 NOTE — Plan of Care (Signed)
  Problem: Education: Goal: Knowledge of General Education information will improve Description: Including pain rating scale, medication(s)/side effects and non-pharmacologic comfort measures Outcome: Progressing   Problem: Health Behavior/Discharge Planning: Goal: Ability to manage health-related needs will improve Outcome: Progressing   Problem: Clinical Measurements: Goal: Ability to maintain clinical measurements within normal limits will improve Outcome: Progressing Goal: Will remain free from infection Outcome: Progressing Goal: Diagnostic test results will improve Outcome: Progressing Goal: Cardiovascular complication will be avoided Outcome: Progressing   Problem: Activity: Goal: Risk for activity intolerance will decrease Outcome: Progressing   Problem: Nutrition: Goal: Adequate nutrition will be maintained Outcome: Progressing   Problem: Coping: Goal: Level of anxiety will decrease Outcome: Progressing   Problem: Elimination: Goal: Will not experience complications related to bowel motility Outcome: Progressing Goal: Will not experience complications related to urinary retention Outcome: Progressing   Problem: Pain Managment: Goal: General experience of comfort will improve and/or be controlled Outcome: Progressing   Problem: Safety: Goal: Ability to remain free from injury will improve Outcome: Progressing   Problem: Skin Integrity: Goal: Risk for impaired skin integrity will decrease Outcome: Progressing   Problem: Education: Goal: Ability to describe self-care measures that may prevent or decrease complications (Diabetes Survival Skills Education) will improve Outcome: Progressing Goal: Individualized Educational Video(s) Outcome: Progressing   Problem: Coping: Goal: Ability to adjust to condition or change in health will improve Outcome: Progressing   Problem: Fluid Volume: Goal: Ability to maintain a balanced intake and output will  improve Outcome: Progressing   Problem: Health Behavior/Discharge Planning: Goal: Ability to identify and utilize available resources and services will improve Outcome: Progressing Goal: Ability to manage health-related needs will improve Outcome: Progressing   Problem: Metabolic: Goal: Ability to maintain appropriate glucose levels will improve Outcome: Progressing   Problem: Nutritional: Goal: Maintenance of adequate nutrition will improve Outcome: Progressing Goal: Progress toward achieving an optimal weight will improve Outcome: Progressing   Problem: Skin Integrity: Goal: Risk for impaired skin integrity will decrease Outcome: Progressing   Problem: Tissue Perfusion: Goal: Adequacy of tissue perfusion will improve Outcome: Progressing

## 2023-09-30 NOTE — Plan of Care (Signed)
 09/30/2023  To whom it may concern,  Khalfani Weideman (DOB: 1960/03/25) was admitted to [facility] from 09/29/2023 - 09/30/2023 for medical treatment. He is cleared to return to work on Sunday Oct 09 2023.  If you have any questions or concerns please call [phone] and ask for Dr. Luz.   Philipe Laswell M.D. Medicine Attending

## 2023-09-30 NOTE — Discharge Summary (Signed)
 Physician Discharge Summary   Patient: Roberto Allen MRN: [id number] DOB: 08-Nov-1960  Admit date:     09/29/2023  Discharge date: 09/30/23  Discharge Physician: ATLEE ABERNETHY    PCP: Cesario Friend, MD      Discharge Diagnoses: Principal Problem:   Wound infection Active Problems:   Hypertension   Coronary Artery Disease (non-obstructive by cath 05/2011)   Hyperlipidemia associated with type 2 diabetes mellitus (HCC)   DM (diabetes mellitus), type 2 with renal complications (HCC)   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 diabetes mellitus (HCC)   CHF (congestive heart failure) (HCC)   Hypokalemia   Acute renal failure superimposed on stage 4 chronic kidney disease (HCC)   Normocytic anemia   Right foot ulcer (HCC)   Abscess of right foot  Resolved Problems:   * No resolved hospital problems. *  Hospital Course:  63 yo M who presents with a wound/abscess infection on the R foot. Podiatry was consulted. MRI of the R foot showed --> Soft tissue wound below the distal first digit with moderate phlegmonous change. No drainable collection or associated osteomyelitis. Subcutaneous edema suggesting cellulitis. Pt received IV unasyn  and IV zyvox . Dr. Malvin from podiatry performed debridement of the R foot on 09/30/2023. Dr. Malvin advised that the pt could be discharged with PO augmentin  and PO doxycycline . Dr. Malvin and the pt have a mutual agreement for follow up next week for a wound check. At that visit the wound culture from today 09/30/2023 should be followed to ensure that Augmentin  and doxycyline will cover whatever organism is growing.   Scripts for renally reduced Augmentin  and doxycyline were sent to the pt's pharmacy.   Physical therapy cleared the pt for safe discharge on 09/30/2023.  DISCHARGE MEDICATION: Allergies as of 09/30/2023       Reactions   Metformin  And Related Other (See Comments)   Due to CHF and CRI        Medication List     STOP taking these medications     furosemide  40 MG tablet Commonly known as: LASIX        TAKE these medications    Accu-Chek FastClix Lancets Misc   albuterol  108 (90 Base) MCG/ACT inhaler Commonly known as: VENTOLIN  HFA Inhale 1 puff into the lungs every 6 (six) hours as needed for shortness of breath.   allopurinol 100 MG tablet Commonly known as: ZYLOPRIM Take 100 mg by mouth daily.   amoxicillin -clavulanate 500-125 MG tablet Commonly known as: Augmentin  Take 1 tablet by mouth 2 (two) times daily for 10 days.   aspirin  EC 81 MG tablet Take 1 tablet (81 mg total) by mouth daily.   atorvastatin  80 MG tablet Commonly known as: LIPITOR  Take 1 tablet (80 mg total) by mouth daily.   carvedilol  12.5 MG tablet Commonly known as: COREG  Take 1 tablet (12.5 mg total) by mouth 2 (two) times daily.   doxycycline  100 MG tablet Commonly known as: VIBRA -TABS Take 1 tablet (100 mg total) by mouth 2 (two) times daily for 10 days.   HumaLOG  KwikPen 100 UNIT/ML KwikPen Generic drug: insulin  lispro Inject 4 Units into the skin 3 (three) times daily as needed (high blood sugar).   hydrALAZINE  100 MG tablet Commonly known as: APRESOLINE  Take 1 tablet (100 mg total) by mouth 3 (three) times daily.   HYDROcodone -acetaminophen  5-325 MG tablet Commonly known as: NORCO/VICODIN Take 1-2 tablets by mouth every 6 (six) hours as needed. What changed: reasons to take this   insulin  glargine  100 UNIT/ML injection Commonly known as: LANTUS  Inject 15 Units into the skin daily.   isosorbide  mononitrate 60 MG 24 hr tablet Commonly known as: IMDUR  Take 60 mg by mouth 2 (two) times daily.   Jardiance  25 MG Tabs tablet Generic drug: empagliflozin  Take 25 mg by mouth daily.   Kerendia 20 MG Tabs Generic drug: Finerenone Take 1 tablet by mouth daily.   lidocaine  5 % Commonly known as: LIDODERM  Place 1 patch onto the skin daily.   magnesium  oxide 400 (240 Mg) MG tablet Commonly known as: MAG-OX TAKE 1 TABLET BY  MOUTH EVERY DAY   omeprazole  20 MG capsule Commonly known as: PRILOSEC Take 1 capsule (20 mg total) by mouth daily.   OneTouch Verio test strip Generic drug: glucose blood Use to check blood sugar 2 times a day   Ozempic  (2 MG/DOSE) 8 MG/3ML Sopn Generic drug: Semaglutide  (2 MG/DOSE) Inject 2 mg into the skin once a week.   sevelamer  800 MG tablet Commonly known as: RENAGEL  Take 800 mg by mouth every evening.   silver  sulfADIAZINE  1 % cream Commonly known as: Silvadene  Apply 1 Application topically daily. What changed:  when to take this reasons to take this   tadalafil  20 MG tablet Commonly known as: CIALIS  TAKE ONE TABLET BY MOUTH DAILY AS NEEDED FOR ERECTILE DYSFUNCTION   tiZANidine 4 MG tablet Commonly known as: ZANAFLEX Take 4 mg by mouth 3 (three) times daily.   torsemide  20 MG tablet Commonly known as: DEMADEX  Take 1 tablet (20 mg total) by mouth daily. What changed: when to take this        Discharge Exam: Filed Weights   09/29/23 2251  Weight: 103.1 kg   Physical Exam HENT:     Head: Normocephalic.     Mouth/Throat:     Mouth: Mucous membranes are moist.  Cardiovascular:     Rate and Rhythm: Normal rate.  Pulmonary:     Effort: Pulmonary effort is normal.  Abdominal:     Palpations: Abdomen is soft.  Skin:    General: Skin is warm.     Comments: R foot wrapped   Neurological:     Mental Status: He is alert and oriented to person, place, and time. Mental status is at baseline.  Psychiatric:        Mood and Affect: Mood normal.      Condition at discharge: fair  The results of significant diagnostics from this hospitalization (including imaging, microbiology, ancillary and laboratory) are listed below for reference.   Imaging Studies: MR FOOT RIGHT WO CONTRAST Result Date: 09/29/2023 EXAM DESCRIPTION: MR FOOT RIGHT WO CONTRAST CLINICAL HISTORY: Soft tissue infection suspected, foot, xray done; osteo? sent from [place] w abn xr, ct  COMPARISON: None Available. TECHNIQUE: MRI of the foot is performed according to our usual protocol with multiplanar multi sequence imaging. FINDINGS: There is a soft tissue wound/ulceration inferior to the proximal phalanx of the first digit. Moderate phlegmonous changes and skin thickening. No organized fluid collection. No overlying osteomyelitis. Mild to moderate subcutaneous edema suggesting cellulitis. No Morton's neuroma. Moderate atrophy of the musculature. The tendons are unremarkable. No significant degenerative change. The marrow signal is unremarkable. Unremarkable postoperative change distal second digit. IMPRESSION: Soft tissue wound below the distal first digit with moderate phlegmonous change. No drainable collection or associated osteomyelitis. Subcutaneous edema suggesting cellulitis. Moderate muscle atrophy. Unremarkable postoperative change distal second digit. Electronically signed by: Reyes Frees MD 09/29/2023 09:47 PM EDT RP Workstation: [id number]  DG Foot Complete Right Result Date: 09/29/2023 CLINICAL DATA:  Foot wound. EXAM: RIGHT FOOT COMPLETE - 3+ VIEW COMPARISON:  03/01/2023. FINDINGS: No fracture.  No bone lesion. There are no areas of bone resorption to suggest osteomyelitis. Previous resection of the distal second toe at the D IP joint. Joints are normally spaced and aligned. No significant arthropathic changes. Small dorsal and plantar calcaneal spurs. Small amount of soft tissue air is noted medial to first metatarsophalangeal joint. There is associated soft tissue swelling with more diffuse soft tissue swelling along the forefoot. IMPRESSION: 1. No fracture, bone lesion or evidence of osteomyelitis. Electronically Signed   By: Alm Parkins M.D.   On: 09/29/2023 13:13    Microbiology: Results for orders placed or performed during the hospital encounter of 09/29/23  Blood culture (routine x 2)     Status: None (Preliminary result)   Collection Time: 09/29/23 12:28 PM    Specimen: BLOOD  Result Value Ref Range Status   Specimen Description BLOOD RIGHT ANTECUBITAL  Final   Special Requests   Final    BOTTLES DRAWN AEROBIC AND ANAEROBIC Blood Culture adequate volume   Culture   Final    NO GROWTH < 24 HOURS Performed at [facility], [address] [place], [place] [address]    Report Status PENDING  Incomplete  Blood culture (routine x 2)     Status: None (Preliminary result)   Collection Time: 09/29/23  9:55 PM   Specimen: BLOOD RIGHT ARM  Result Value Ref Range Status   Specimen Description BLOOD RIGHT ARM  Final   Special Requests   Final    BOTTLES DRAWN AEROBIC AND ANAEROBIC Blood Culture adequate volume   Culture   Final    NO GROWTH < 12 HOURS Performed at [facility], [address], [place], [place] [address]    Report Status PENDING  Incomplete    Labs: CBC: Recent Labs  Lab 09/29/23 1225 09/30/23 0643  WBC 9.4 9.9  HGB 9.4* 9.7*  HCT 29.4* 30.7*  MCV 84.2 83.0  PLT 295 326   Basic Metabolic Panel: Recent Labs  Lab 09/29/23 1225 09/29/23 2155 09/30/23 0643  NA 138 141 140  K 2.9* 3.4* 3.1*  CL 100 101 106  CO2 26 28 27   GLUCOSE 274* 99 114*  BUN 44* 45* 41*  CREATININE 3.64* 3.47* 3.28*  CALCIUM  8.8* 9.2 8.5*  MG  --  2.6* 2.3  PHOS  --  3.4 3.6   Liver Function Tests: Recent Labs  Lab 09/29/23 1225 09/30/23 0643  AST 19 17  ALT 18 16  ALKPHOS 121 105  BILITOT 0.8 0.7  PROT 6.7 6.5  ALBUMIN 3.3* 3.1*   CBG: Recent Labs  Lab 09/29/23 2328 09/30/23 0404 09/30/23 0817 09/30/23 1143  GLUCAP 193* 99 198* 153*    Discharge time spent: greater than 30 minutes.  Signed: Angi Goodell , MD [facility] 09/30/2023

## 2023-09-30 NOTE — Consult Note (Signed)
 WOC Nurse Consult Note: Reason for Consult:  longstanding nonhealing wound of right posterior medial distal foot   Wound type: neuropathic  Pressure Injury POA: NA Podiatry has seen this patient, WOC nursing will not consult for that reason.   Ivey Cina [facility], CNS, CWON-AP [phone]

## 2023-09-30 NOTE — Evaluation (Signed)
 Occupational Therapy Evaluation and Discharge Patient Details Name: Roberto Allen MRN: [id number] DOB: 1960/11/02 Today's Date: 09/30/2023   History of Present Illness   Roberto Allen is a 63 y.o. male admitted 09/29/23 for right foot wound infection. No evidence of osteomyelitis on imaging. Pt s/p bilateral subcutaneous tissue debridement 8/15. PMHx: T2DM, CKD, HTN, HLD, and CAD.     Clinical Impressions At baseline, pt is largely Independent with ADLs, but with CGA to Min assist needed to donn/doff socks, wash/dry feet, and thread feet into clothing. At baseline, pt performs functional mobility Independent without an AD and works and drives. Pt now presents at or very near baseline PLOF with pt demonstrating ability to complete ADLs largely Independent to Min assist and functional mobility without an AD with Supervision. Pt's wife is able to safely provide assistance to pt at pt's current level of function. No further benefit from acute skilled OT services at this time and no post acute skilled OT needs are anticipated. OT is signing off at this time.      If plan is discharge home, recommend the following:   A little help with walking and/or transfers;A little help with bathing/dressing/bathroom;Assistance with cooking/housework;Assist for transportation;Help with stairs or ramp for entrance     Functional Status Assessment   Patient has had a recent decline in their functional status and demonstrates the ability to make significant improvements in function in a reasonable and predictable amount of time.     Equipment Recommendations   None recommended by OT (Pt already has needed equipment)     Recommendations for Other Services         Precautions/Restrictions   Precautions Precautions: Fall Required Braces or Orthoses: Other Brace Other Brace: Post-Op Shoe BLE Restrictions Weight Bearing Restrictions Per Provider Order: Yes RLE Weight Bearing Per Provider Order:  Weight bearing as tolerated LLE Weight Bearing Per Provider Order: Weight bearing as tolerated     Mobility Bed Mobility Overal bed mobility: Modified Independent             General bed mobility comments: Mod I for supine to sit    Transfers Overall transfer level: Needs assistance Equipment used: None Transfers: Sit to/from Stand, Bed to chair/wheelchair/BSC Sit to Stand: Supervision     Step pivot transfers: Supervision     General transfer comment: Pt stood from lowest bed height. Pushed up with BUE support. Good eccentric control.      Balance Overall balance assessment: Mild deficits observed, not formally tested                                         ADL either performed or assessed with clinical judgement   ADL Overall ADL's : Needs assistance/impaired Eating/Feeding: Independent;Sitting   Grooming: Supervision/safety;Standing   Upper Body Bathing: Supervision/ safety;Set up;Sitting   Lower Body Bathing: Supervison/ safety;Set up;Minimal assistance;Sit to/from stand Lower Body Bathing Details (indicate cue type and reason): largely Set up-Supervision with Min assist for washinging/drying B feet; simulated at EOB Upper Body Dressing : Independent;Sitting   Lower Body Dressing: Minimal assistance;Sitting/lateral leans;Sit to/from stand Lower Body Dressing Details (indicate cue type and reason): Min assist to don/doff socks and thread feet into clothing; able to don/doff B post-op shoes with Set up in sitting and able to bring clothes up/down over hips with Supervision in standing Toilet Transfer: Supervision/safety;Ambulation;Regular Toilet;Grab bars (without an AD)   Toileting-  Clothing Manipulation and Hygiene: Supervision/safety;Sit to/from stand   Tub/ Engineer, structural: Supervision/safety;Contact guard assist;Tub Teacher, English as a foreign language Details (indicate cue type and reason): simulated; close Supervision to Contact guard  assist for safety Functional mobility during ADLs: Supervision/safety (without an AD)       Vision Baseline Vision/History: 1 Wears glasses (readers) Ability to See in Adequate Light: 0 Adequate Patient Visual Report: No change from baseline Vision Assessment?: No apparent visual deficits Additional Comments: WFL for tasks assessed     Perception         Praxis         Pertinent Vitals/Pain Pain Assessment Pain Assessment: No/denies pain     Extremity/Trunk Assessment Upper Extremity Assessment Upper Extremity Assessment: Right hand dominant;Overall [facility] for tasks assessed   Lower Extremity Assessment Lower Extremity Assessment: Defer to PT evaluation   Cervical / Trunk Assessment Cervical / Trunk Assessment: Normal   Communication Communication Communication: No apparent difficulties   Cognition Arousal: Alert Behavior During Therapy: WFL for tasks assessed/performed Cognition: No apparent impairments             OT - Cognition Comments: Pt AAOx4 and pleasant throughout session.                 Following commands: Intact       Cueing  General Comments      Wife present and supportive throughout session. VSS on RA.   Exercises     Shoulder Instructions      Home Living Family/patient expects to be discharged to:: Private residence Living Arrangements: Spouse/significant other Available Help at Discharge: Family;Available 24 hours/day Type of Home: House Home Access: Stairs to enter [employer] of Steps: 5 at front door (2 at garage) Entrance Stairs-Rails: Left;Right;Can reach both (at front; none at garage) Home Layout: One level     Bathroom Shower/Tub: Tub/shower unit;Walk-in shower (usually uses tub)   Bathroom Toilet: Standard     Home Equipment: Shower seat;Cane - single point;Toilet riser          Prior Functioning/Environment Prior Level of Function : Independent/Modified Independent;Needs  assist;Working/employed;Driving             Mobility Comments: Independent without an AD ADLs Comments: largely Independent with ADLs, but with CGA to Min assist needed to donn/doff socks, wash/dry feet, and thread feet into clothing; drives; works; enjoys Biomedical engineer Problem List: Impaired balance (sitting and/or standing)   OT Treatment/Interventions:        OT Goals(Current goals can be found in the care plan section)   Acute Rehab OT Goals Patient Stated Goal: to return home and return to work OT Goal Formulation: All assessment and education complete, DC therapy   OT Frequency:       Co-evaluation              AM-PAC OT 6 Clicks Daily Activity     Outcome Measure Help from another person eating meals?: None Help from another person taking care of personal grooming?: A Little Help from another person toileting, which includes using toliet, bedpan, or urinal?: A Little Help from another person bathing (including washing, rinsing, drying)?: A Little Help from another person to put on and taking off regular upper body clothing?: None Help from another person to put on and taking off regular lower body clothing?: A Little 6 Click Score: 20   End of Session Equipment Utilized During Treatment: Gait belt;Other (comment) (B post-op shoes) Nurse Communication: Mobility status;Other (comment) (Pt  ready to discharge from an OT standpoint.)  Activity Tolerance: Patient tolerated treatment well Patient left: in chair;with call bell/phone within reach;with family/visitor present  OT Visit Diagnosis: Unsteadiness on feet (R26.81)                Time: 8753-8693 OT Time Calculation (min): 20 min Charges:  OT General Charges $OT Visit: 1 Visit OT Evaluation $OT Eval Low Complexity: 1 Low  Margarie Rockey HERO., OTR/L, MA Acute Rehab [phone]   Margarie FORBES Horns 09/30/2023, 3:41 PM

## 2023-09-30 NOTE — Progress Notes (Signed)
 Orthopedic Tech Progress Note Patient Details:  Roberto Allen 04-Dec-1960 [id number]  Ortho Devices Type of Ortho Device: Postop shoe/boot Ortho Device/Splint Location: BLE Ortho Device/Splint Interventions: Ordered, Application   Post Interventions Patient Tolerated: Well  Antonisha Waskey A Ah Bott 09/30/2023, 10:22 AM

## 2023-10-04 LAB — CULTURE, BLOOD (ROUTINE X 2)
Culture: NO GROWTH
Culture: NO GROWTH
Special Requests: ADEQUATE
Special Requests: ADEQUATE

## 2023-10-05 LAB — AEROBIC/ANAEROBIC CULTURE W GRAM STAIN (SURGICAL/DEEP WOUND)

## 2023-10-07 ENCOUNTER — Encounter: Payer: Self-pay | Admitting: Podiatry

## 2023-10-07 ENCOUNTER — Ambulatory Visit (INDEPENDENT_AMBULATORY_CARE_PROVIDER_SITE_OTHER): Payer: Self-pay | Admitting: Podiatry

## 2023-10-07 DIAGNOSIS — L97511 Non-pressure chronic ulcer of other part of right foot limited to breakdown of skin: Secondary | ICD-10-CM

## 2023-10-07 DIAGNOSIS — E1142 Type 2 diabetes mellitus with diabetic polyneuropathy: Secondary | ICD-10-CM

## 2023-10-07 NOTE — Progress Notes (Signed)
  Subjective:  Patient ID: Roberto Allen, male    DOB: 1961-01-14,  MRN: [id number]  Chief Complaint  Patient presents with   Diabetes   Wound Check    F/U Wound left foot plantar first met head.0 pain. Cleaning wound with iodine. IDDM A1C 7.5.   History of Present Illness Roberto Allen is a 63 year old male who presents for follow-up evaluation of wound on the right foot.  He states he was at the [facility] and x-rays were taken that showed air on x-ray.  He was then brought to [facility] where he was admitted.  He was seen by Dr. Malvin at the time of bedside debridement was performed.  States he is still taking the antibiotic.  Feels much better overall and is ready to return to work.  Does not report any fevers or chills.      Objective:    Physical Exam General: AAO x3, NAD  Dermatological: On the right foot submetatarsal 1 hyperkeratotic lesion with dried blood.  There is superficial area of skin breakdown but there is no areas of probing, undermining or tunneling.  There is no surrounding erythema, drainage or pus today.  There is no fluctuation or crepitation.  There is no malodor.    Vascular: Dorsalis Pedis artery and Posterior Tibial artery pedal pulses are 2/4 bilateral with immedate capillary fill time. There is no pain with calf compression, swelling, warmth, erythema.   Neruologic: Sensation decreased  Musculoskeletal: Prominent metatarsal head. No pain.       Results    Assessment:   Superficial ulceration right foot  Plan:  Patient was evaluated and treated and all questions answered.  Assessment and Plan Assessment & Plan Callus on foot with history of prior ulceration - Sharply debrided the wound today with a #312 with scalpel to remove nonviable, devitalized tissue in order promote wound healing.  There is minimal blood loss.  Hemostasis achieved.  Betadine applied followed by dressing which has been using at home.  Finish the course of antibiotics.   Continue offloading.  Overall doing better and he can return to work although he states he has been to be able to sit at work.  Discussed limiting activity and encouraged elevation.  He is to monitor closely for any signs or symptoms of reoccurrence of infection or any worsening going immediately or report back to the emergency room should any occur.  Return in about 2 weeks (around 10/21/2023) for ulcer.  Donnice JONELLE Fees DPM

## 2023-10-25 ENCOUNTER — Encounter: Payer: Self-pay | Admitting: Podiatry

## 2023-10-25 ENCOUNTER — Ambulatory Visit (INDEPENDENT_AMBULATORY_CARE_PROVIDER_SITE_OTHER): Admitting: Podiatry

## 2023-10-25 DIAGNOSIS — E1142 Type 2 diabetes mellitus with diabetic polyneuropathy: Secondary | ICD-10-CM | POA: Diagnosis not present

## 2023-10-25 DIAGNOSIS — L84 Corns and callosities: Secondary | ICD-10-CM | POA: Diagnosis not present

## 2023-10-25 NOTE — Progress Notes (Signed)
  Subjective:  Patient ID: Roberto Allen, male    DOB: 10-19-1960,  MRN: [id number]  Chief Complaint  Patient presents with   Diabetic Ulcer    Rm11 F/u ulceration right foot plantar/ patient says he is doing well.    History of Present Illness Roberto Allen is a 63 year old male who presents for follow-up evaluation of wound on the right foot.    States he has been doing well he has not seen any drainage or pus or any bleeding.  No swelling or redness.  He is also going to the [facility] as he had bleeding from the bottom of his foot while he was in the [employer] in the same area.  Last BS 78 this morning A1c- 7.8 09/29/2023  Objective:    Physical Exam General: AAO x3, NAD  Dermatological: On the right foot submetatarsal 1 is a hyperkeratotic lesion with some dried blood present but upon debridement there is no underlying ulceration.  There is no surrounding erythema, ascending cellulitis there is no drainage or pus.  No fluctuation or crepitation.  No malodor.    Vascular: Dorsalis Pedis artery and Posterior Tibial artery pedal pulses are 2/4 bilateral with immedate capillary fill time. There is no pain with calf compression, swelling, warmth, erythema.   Neruologic: Sensation decreased  Musculoskeletal: Prominent metatarsal head. No pain.   Assessment:   Superficial ulceration right foot  Plan:  Patient was evaluated and treated and all questions answered.  Assessment and Plan Assessment & Plan Callus on foot with history of prior ulceration Sharply debrided hyperkeratotic lesion to the any complications.  Wound appears to have resolved and there is no signs of infection he is to monitor closely for any signs or symptoms of reoccurrence of infection or worsening of the wound.  Discussed that if inspection with continued pain glucose possible.  Discussed moisturizer on the area daily at nighttime.  Return in about 6 weeks (around 12/06/2023).  Donnice JONELLE Fees DPM

## 2023-12-08 ENCOUNTER — Ambulatory Visit (INDEPENDENT_AMBULATORY_CARE_PROVIDER_SITE_OTHER): Admitting: Podiatry

## 2023-12-08 ENCOUNTER — Encounter: Payer: Self-pay | Admitting: Podiatry

## 2023-12-08 VITALS — Ht 71.0 in | Wt 227.3 lb

## 2023-12-08 DIAGNOSIS — E1142 Type 2 diabetes mellitus with diabetic polyneuropathy: Secondary | ICD-10-CM | POA: Diagnosis not present

## 2023-12-08 DIAGNOSIS — L84 Corns and callosities: Secondary | ICD-10-CM | POA: Diagnosis not present

## 2023-12-08 NOTE — Patient Instructions (Signed)

## 2023-12-08 NOTE — Progress Notes (Signed)
  Subjective:  Patient ID: Roberto Allen, male    DOB: [birth year],  MRN: [id number]  Chief Complaint  Patient presents with   Callouses    Pt is here to f/u on bilateral feet due to callous he states that both feet are healed and has no other complaints.    History of Present Illness Roberto Allen is a 63 year old male who presents for follow-up evaluation of wound on the right foot.  States he has been doing very well and has not seen any open lesions.  No areas of drainage.  His skin is dry states that his wife helps apply cream to his feet daily.  He does not report any new concerns or injuries.  No fevers or chills.   A1c- 7.8 09/29/2023  Objective:    Physical Exam General: AAO x3, NAD  Dermatological: On the right and left foot submetatarsal 1 is a hyperkeratotic lesion without any evidence of dried blood or ulcerations.  Nails are minimally elongated.  There is clinical to dry skin present to the plantar aspects of bilateral feet without any skin fissures or open sores.    Vascular: Dorsalis Pedis artery and Posterior Tibial artery pedal pulses are 2/4 bilateral with immedate capillary fill time. There is no pain with calf compression, swelling, warmth, erythema.   Neruologic: Sensation decreased  Musculoskeletal: Prominent metatarsal head. No pain.   Assessment:   Preulcerative calluses bilaterally, dry skin  Plan:  Patient was evaluated and treated and all questions answered.  Assessment and Plan Assessment & Plan Callus on foot with history of prior ulceration -There was minimal callus formation submetatarsal 1 bilaterally which I debrided without any complications or bleeding.  Continue moisturizer, offloading.  Discussed the importance of daily foot inspection with glucose control.  Return in about 3 months (around 03/09/2024) for diabetic foot exam. Given history of recurrent ulcerations and amputations recommend regular follow-ups.  Roberto Allen DPM

## 2024-01-20 ENCOUNTER — Encounter: Payer: Self-pay | Admitting: Podiatry

## 2024-01-23 NOTE — Telephone Encounter (Signed)
 Can someone please help me write a letter stating that the patient has flatfeet which could be causing back pain? Thanks!

## 2024-01-24 ENCOUNTER — Encounter: Payer: Self-pay | Admitting: Podiatry

## 2024-01-24 NOTE — Telephone Encounter (Signed)
 Can someone please assist with the letter? Thanks!

## 2024-02-24 ENCOUNTER — Ambulatory Visit: Admitting: Podiatry

## 2024-03-08 ENCOUNTER — Ambulatory Visit: Admitting: Podiatry

## 2024-03-15 ENCOUNTER — Ambulatory Visit: Admitting: Podiatry

## 2024-04-09 ENCOUNTER — Ambulatory Visit: Admitting: Podiatry
